# Patient Record
Sex: Female | Born: 1940 | Race: Black or African American | Hispanic: No | Marital: Married | Smoking: Never smoker
Health system: Southern US, Community
[De-identification: ages and names within clinical notes are randomized; demographics above are authoritative.]

## PROBLEM LIST (undated history)

## (undated) DIAGNOSIS — J45909 Unspecified asthma, uncomplicated: Secondary | ICD-10-CM

## (undated) DIAGNOSIS — K219 Gastro-esophageal reflux disease without esophagitis: Secondary | ICD-10-CM

## (undated) DIAGNOSIS — Z8719 Personal history of other diseases of the digestive system: Secondary | ICD-10-CM

## (undated) DIAGNOSIS — E785 Hyperlipidemia, unspecified: Secondary | ICD-10-CM

## (undated) DIAGNOSIS — G629 Polyneuropathy, unspecified: Secondary | ICD-10-CM

## (undated) DIAGNOSIS — Z8619 Personal history of other infectious and parasitic diseases: Secondary | ICD-10-CM

## (undated) DIAGNOSIS — E669 Obesity, unspecified: Secondary | ICD-10-CM

## (undated) DIAGNOSIS — C7800 Secondary malignant neoplasm of unspecified lung: Secondary | ICD-10-CM

## (undated) DIAGNOSIS — I2510 Atherosclerotic heart disease of native coronary artery without angina pectoris: Secondary | ICD-10-CM

## (undated) DIAGNOSIS — Z85038 Personal history of other malignant neoplasm of large intestine: Secondary | ICD-10-CM

## (undated) DIAGNOSIS — C189 Malignant neoplasm of colon, unspecified: Secondary | ICD-10-CM

## (undated) DIAGNOSIS — D509 Iron deficiency anemia, unspecified: Secondary | ICD-10-CM

## (undated) DIAGNOSIS — I10 Essential (primary) hypertension: Secondary | ICD-10-CM

## (undated) HISTORY — PX: ABDOMINAL HYSTERECTOMY: SHX81

## (undated) HISTORY — DX: Malignant neoplasm of colon, unspecified: C18.9

## (undated) HISTORY — PX: APPENDECTOMY: SHX54

## (undated) HISTORY — DX: Secondary malignant neoplasm of unspecified lung: C78.00

## (undated) HISTORY — PX: CHOLECYSTECTOMY: SHX55

## (undated) HISTORY — PX: COLON SURGERY: SHX602

---

## 2000-02-17 ENCOUNTER — Encounter: Payer: Self-pay | Admitting: Cardiology

## 2000-02-17 ENCOUNTER — Ambulatory Visit (HOSPITAL_COMMUNITY): Admission: RE | Admit: 2000-02-17 | Discharge: 2000-02-17 | Payer: Self-pay | Admitting: Cardiology

## 2000-12-15 ENCOUNTER — Other Ambulatory Visit: Admission: RE | Admit: 2000-12-15 | Discharge: 2000-12-15 | Payer: Self-pay | Admitting: Internal Medicine

## 2001-05-02 ENCOUNTER — Ambulatory Visit (HOSPITAL_COMMUNITY): Admission: RE | Admit: 2001-05-02 | Discharge: 2001-05-02 | Payer: Self-pay | Admitting: Internal Medicine

## 2001-06-07 ENCOUNTER — Other Ambulatory Visit: Admission: RE | Admit: 2001-06-07 | Discharge: 2001-06-07 | Payer: Self-pay | Admitting: Specialist

## 2001-06-13 ENCOUNTER — Ambulatory Visit (HOSPITAL_COMMUNITY): Admission: RE | Admit: 2001-06-13 | Discharge: 2001-06-13 | Payer: Self-pay | Admitting: Specialist

## 2001-06-13 ENCOUNTER — Encounter: Payer: Self-pay | Admitting: Family Medicine

## 2001-12-03 ENCOUNTER — Emergency Department (HOSPITAL_COMMUNITY): Admission: EM | Admit: 2001-12-03 | Discharge: 2001-12-04 | Payer: Self-pay | Admitting: Emergency Medicine

## 2001-12-04 ENCOUNTER — Encounter: Payer: Self-pay | Admitting: Emergency Medicine

## 2003-12-16 ENCOUNTER — Ambulatory Visit (HOSPITAL_COMMUNITY): Admission: RE | Admit: 2003-12-16 | Discharge: 2003-12-16 | Payer: Self-pay | Admitting: Family Medicine

## 2004-03-17 ENCOUNTER — Ambulatory Visit (HOSPITAL_COMMUNITY): Admission: RE | Admit: 2004-03-17 | Discharge: 2004-03-17 | Payer: Self-pay | Admitting: Family Medicine

## 2005-06-01 ENCOUNTER — Ambulatory Visit (HOSPITAL_COMMUNITY): Admission: RE | Admit: 2005-06-01 | Discharge: 2005-06-01 | Payer: Self-pay | Admitting: Family Medicine

## 2005-06-20 ENCOUNTER — Emergency Department (HOSPITAL_COMMUNITY): Admission: EM | Admit: 2005-06-20 | Discharge: 2005-06-20 | Payer: Self-pay | Admitting: Emergency Medicine

## 2006-05-12 ENCOUNTER — Other Ambulatory Visit: Admission: RE | Admit: 2006-05-12 | Discharge: 2006-05-12 | Payer: Self-pay | Admitting: Family Medicine

## 2006-06-07 ENCOUNTER — Encounter (INDEPENDENT_AMBULATORY_CARE_PROVIDER_SITE_OTHER): Payer: Self-pay | Admitting: Specialist

## 2006-06-07 ENCOUNTER — Ambulatory Visit (HOSPITAL_COMMUNITY): Admission: RE | Admit: 2006-06-07 | Discharge: 2006-06-07 | Payer: Self-pay | Admitting: Internal Medicine

## 2006-06-07 ENCOUNTER — Ambulatory Visit: Payer: Self-pay | Admitting: Internal Medicine

## 2006-07-19 ENCOUNTER — Ambulatory Visit (HOSPITAL_COMMUNITY): Admission: RE | Admit: 2006-07-19 | Discharge: 2006-07-19 | Payer: Self-pay | Admitting: Internal Medicine

## 2006-10-29 ENCOUNTER — Emergency Department (HOSPITAL_COMMUNITY): Admission: EM | Admit: 2006-10-29 | Discharge: 2006-10-29 | Payer: Self-pay | Admitting: Emergency Medicine

## 2007-05-29 ENCOUNTER — Other Ambulatory Visit: Admission: RE | Admit: 2007-05-29 | Discharge: 2007-05-29 | Payer: Self-pay | Admitting: Family Medicine

## 2007-08-08 ENCOUNTER — Ambulatory Visit (HOSPITAL_COMMUNITY): Admission: RE | Admit: 2007-08-08 | Discharge: 2007-08-08 | Payer: Self-pay | Admitting: Family Medicine

## 2008-07-03 ENCOUNTER — Other Ambulatory Visit: Admission: RE | Admit: 2008-07-03 | Discharge: 2008-07-03 | Payer: Self-pay | Admitting: Family Medicine

## 2008-08-14 ENCOUNTER — Ambulatory Visit (HOSPITAL_COMMUNITY): Admission: RE | Admit: 2008-08-14 | Discharge: 2008-08-14 | Payer: Self-pay | Admitting: Family Medicine

## 2008-09-03 ENCOUNTER — Ambulatory Visit (HOSPITAL_COMMUNITY): Admission: RE | Admit: 2008-09-03 | Discharge: 2008-09-03 | Payer: Self-pay | Admitting: Family Medicine

## 2009-07-04 ENCOUNTER — Other Ambulatory Visit: Admission: RE | Admit: 2009-07-04 | Discharge: 2009-07-04 | Payer: Self-pay | Admitting: Family Medicine

## 2009-07-12 DIAGNOSIS — J45909 Unspecified asthma, uncomplicated: Secondary | ICD-10-CM

## 2009-07-12 HISTORY — DX: Unspecified asthma, uncomplicated: J45.909

## 2009-08-14 ENCOUNTER — Emergency Department (HOSPITAL_COMMUNITY): Admission: EM | Admit: 2009-08-14 | Discharge: 2009-08-15 | Payer: Self-pay | Admitting: Emergency Medicine

## 2009-08-22 ENCOUNTER — Ambulatory Visit (HOSPITAL_COMMUNITY): Admission: RE | Admit: 2009-08-22 | Discharge: 2009-08-22 | Payer: Self-pay | Admitting: Family Medicine

## 2009-09-15 ENCOUNTER — Ambulatory Visit (HOSPITAL_COMMUNITY): Admission: RE | Admit: 2009-09-15 | Discharge: 2009-09-15 | Payer: Self-pay | Admitting: Family Medicine

## 2009-11-25 ENCOUNTER — Encounter: Admission: RE | Admit: 2009-11-25 | Discharge: 2009-11-25 | Payer: Self-pay | Admitting: Endocrinology

## 2010-07-17 ENCOUNTER — Other Ambulatory Visit
Admission: RE | Admit: 2010-07-17 | Discharge: 2010-07-17 | Payer: Self-pay | Source: Home / Self Care | Admitting: Family Medicine

## 2010-09-30 LAB — GLUCOSE, CAPILLARY: Glucose-Capillary: 170 mg/dL — ABNORMAL HIGH (ref 70–99)

## 2010-10-09 ENCOUNTER — Other Ambulatory Visit (HOSPITAL_COMMUNITY): Payer: Self-pay | Admitting: Family Medicine

## 2010-10-09 DIAGNOSIS — Z139 Encounter for screening, unspecified: Secondary | ICD-10-CM

## 2010-10-15 ENCOUNTER — Ambulatory Visit (HOSPITAL_COMMUNITY)
Admission: RE | Admit: 2010-10-15 | Discharge: 2010-10-15 | Disposition: A | Discharge status: Home / Self Care | Payer: Medicare Other | Source: Ambulatory Visit | Attending: Family Medicine | Admitting: Family Medicine

## 2010-10-15 DIAGNOSIS — Z1231 Encounter for screening mammogram for malignant neoplasm of breast: Secondary | ICD-10-CM | POA: Insufficient documentation

## 2010-10-15 DIAGNOSIS — Z139 Encounter for screening, unspecified: Secondary | ICD-10-CM

## 2010-11-27 NOTE — Op Note (Signed)
[name], [name]              ACCOUNT NO.:  [id number]   MEDICAL RECORD NO.:  [id number]          PATIENT TYPE:  AMB   LOCATION:  DAY                           FACILITY:  [facility]   PHYSICIAN:  Lionel December, M.D.    DATE OF BIRTH:  1941-03-28   DATE OF PROCEDURE:  06/07/2006  DATE OF DISCHARGE:                               OPERATIVE REPORT   PROCEDURE:  Colonoscopy.   INDICATIONS:  Peggy French is 70 year old Afro American female with history of  colonic polyps who is here for surveillance exam.  Family history is  negative for colorectal carcinoma.  Procedure risks were reviewed the  patient, informed consent was obtained.   MEDS FOR CONSCIOUS SEDATION:  Demerol 50 mg IV, Versed 9 mg IV.   FINDINGS:  Procedure performed in endoscopy suite.  The patient's vital  signs and O2 sat were monitored during procedure and remained stable.  The patient was placed in left lateral position.  Rectal examination  performed.  No abnormality noted external or digital exam.  Olympus  videoscope was placed rectum and advanced under vision into sigmoid  colon beyond.  Preparation was satisfactory except she had thick stool  coating of cecal mucosa.  Ileocecal valve was well seen.  Part of the  blunt-end was also seen after washing appeared to be normal.  Pictures  taken for the record.  As the scope was withdrawn colonic mucosa was  carefully examined.  There was two small polyps close to each other at  [facility] colon which were ablated via cold biopsy and submitted in one  container.  Mucosa rest of the colon was normal.  Rectal mucosa  similarly was normal.  Scope was retroflexed to examine anorectal  junction which was unremarkable.  Endoscope was then withdrawn.  The  patient tolerated the procedure well.   FINAL DIAGNOSIS:  Examination performed to cecum.  Two small polyps  ablated via cold biopsy from sigmoid colon (one container).   RECOMMENDATIONS:  She will resume her usual meds and diet as  before.  I  will be contacting patient with results of biopsy and further  recommendations.      Lionel December, M.D.  Electronically Signed     NR/MEDQ  D:  06/07/2006  T:  06/07/2006  Job:  [id number]   cc:   Annia Friendly. Loleta Chance, MD  Fax: [phone]   Catalina Pizza, M.D.  Fax: [phone]

## 2010-11-27 NOTE — Cardiovascular Report (Signed)
[place]  Patient:    Peggy French, Peggy French                     MRN: [id number] Proc. Date: 02/17/00 Adm. Date:  [id number] Attending:  Ophelia Shoulder CC:         Madaline Savage, M.D.  Mirna Mires, M.D.   Cardiac Catheterization  PROCEDURES:                   1. Left heart catheterization.                               2. Coronary angiography.                               3. Left ventriculogram.                               4. Abdominal aortogram.  ATTENDING:                    Lenise Herald, M.D.  COMPLICATIONS:                None.  INDICATIONS:                  Ms. Landino is a 70 year old black female patient of Madaline Savage, M.D. and Mirna Mires, M.D. with a history of hypertension and hyperlipidemia who has continued to complain of chest pain for the last several months.  She underwent a stress Cardiolite on December 30, 1999, which revealed normal EF, breast attenuation, but no evidence of ischemia.  She has continued to complain of chest pain and is now referred for cardiac catheterization to determine coronary anatomy.  DESCRIPTION OF PROCEDURE:     After given informed written consent, the patient was brought to the cardiac catheterization lab where right and left groins were shaved and prepped and draped in the usual sterile fashion.  ECG monitored status.  Using modified Seldinger technique, a #6 French arterial sheath was inserted in the right femoral artery.  A 6 French diagnostic catheter was then used to perform diagnostic angiography.  This reveals medium sized left main with no significant disease.                                The LAD is a medium size vessel which coursed to the apex and gave rise to three diagonal branches.  The LAD is noted to have a 30% ostial and 30% proximal stenotic lesion.  The first and second diagonals were small vessels with no significant disease.  The third diagonal was  a medium size vessel with no significant disease.                                The left circumflex is a medium size vessel which coursed to the AV groove and gave rise to three obtuse marginal branches.  The AV groove circumflex has no significant disease.  The first OM and second OM were medium size vessels with no significant disease.  The third OM was a small vessel with no significant disease.  The right coronary artery is a medium size vessel which is dominant and gives rise to the PDA.  There is a spasm noted in the proximal RCA which is relieved with intracoronary nitroglycerin.  There are no significant lesions in the RCA or PDA.                                Left ventriculogram reveals a preserved EF of 70%.  There is no significant MR.                                Abdominal aortogram reveals no evidence of renal artery stenosis with a normal distal aorta.  HEMODYNAMICS:                 Systemic arterial pressure 150/93.  LV system pressure 149/16.  LV EP of 18.  CONCLUSION:                   1. No significant coronary artery disease.                               2. Spasm noted proximal right coronary artery                                  relieved with intracoronary nitroglycerin.                               3. Normal left ventricular systolic function.                               4. No evidence of renal artery stenosis.                               5. Systemic hypertension. DD:  02/17/00 TD:  02/17/00 Job: [id number]

## 2011-04-01 ENCOUNTER — Emergency Department (HOSPITAL_COMMUNITY)
Admission: EM | Admit: 2011-04-01 | Discharge: 2011-04-01 | Disposition: A | Discharge status: Other Acute Inpatient Hospital | Payer: Medicare Other | Source: Home / Self Care | Attending: Emergency Medicine | Admitting: Emergency Medicine

## 2011-04-01 ENCOUNTER — Encounter: Payer: Self-pay | Admitting: *Deleted

## 2011-04-01 ENCOUNTER — Emergency Department (HOSPITAL_COMMUNITY): Payer: Medicare Other

## 2011-04-01 ENCOUNTER — Other Ambulatory Visit: Payer: Self-pay

## 2011-04-01 ENCOUNTER — Inpatient Hospital Stay (HOSPITAL_COMMUNITY)
Admission: AD | Admit: 2011-04-01 | Discharge: 2011-04-03 | DRG: 392 | Disposition: A | Discharge status: Home / Self Care | Payer: Medicare Other | Source: Other Acute Inpatient Hospital | Attending: Cardiovascular Disease | Admitting: Cardiovascular Disease

## 2011-04-01 DIAGNOSIS — I2510 Atherosclerotic heart disease of native coronary artery without angina pectoris: Secondary | ICD-10-CM | POA: Diagnosis present

## 2011-04-01 DIAGNOSIS — I10 Essential (primary) hypertension: Secondary | ICD-10-CM | POA: Diagnosis present

## 2011-04-01 DIAGNOSIS — Z79899 Other long term (current) drug therapy: Secondary | ICD-10-CM

## 2011-04-01 DIAGNOSIS — K3184 Gastroparesis: Principal | ICD-10-CM | POA: Diagnosis present

## 2011-04-01 DIAGNOSIS — E119 Type 2 diabetes mellitus without complications: Secondary | ICD-10-CM | POA: Diagnosis present

## 2011-04-01 DIAGNOSIS — I200 Unstable angina: Secondary | ICD-10-CM

## 2011-04-01 DIAGNOSIS — M109 Gout, unspecified: Secondary | ICD-10-CM | POA: Diagnosis present

## 2011-04-01 HISTORY — DX: Essential (primary) hypertension: I10

## 2011-04-01 HISTORY — DX: Hyperlipidemia, unspecified: E78.5

## 2011-04-01 LAB — CARDIAC PANEL(CRET KIN+CKTOT+MB+TROPI)
CK, MB: 2.5 ng/mL (ref 0.3–4.0)
CK, MB: 2.4 ng/mL (ref 0.3–4.0)
Total CK: 149 U/L (ref 7–177)
Troponin I: <0.3 ng/mL (ref <0.30)
Troponin I: <0.3 ng/mL (ref <0.30)

## 2011-04-01 LAB — GLUCOSE, CAPILLARY: Glucose-Capillary: 163 mg/dL — ABNORMAL HIGH (ref 70–99)

## 2011-04-01 LAB — BASIC METABOLIC PANEL
BUN: 13 mg/dL (ref 6–23)
CO2: 28 mEq/L (ref 19–32)
GFR calc Af Amer: >60 mL/min (ref >60)
GFR calc non Af Amer: >60 mL/min (ref >60)
Glucose, Bld: 155 mg/dL — ABNORMAL HIGH (ref 70–99)
Potassium: 3.9 mEq/L (ref 3.5–5.1)
Sodium: 139 mEq/L (ref 135–145)

## 2011-04-01 LAB — PROTIME-INR: INR: 1.06 (ref 0.00–1.49)

## 2011-04-01 LAB — CBC
MCV: 79.4 fL (ref 78.0–100.0)
Platelets: 258 10*3/uL (ref 150–400)

## 2011-04-01 LAB — HEPATIC FUNCTION PANEL
ALT: 16 U/L (ref 0–35)
Total Protein: 7.7 g/dL (ref 6.0–8.3)

## 2011-04-01 LAB — MRSA PCR SCREENING: MRSA by PCR: NEGATIVE

## 2011-04-01 LAB — MAGNESIUM: Magnesium: 2 mg/dL (ref 1.5–2.5)

## 2011-04-01 MED ORDER — ASPIRIN 81 MG PO CHEW
324.0000 mg | CHEWABLE_TABLET | Freq: Once | ORAL | Status: AC
  Administered 2011-04-01: 324 mg via ORAL
  Filled 2011-04-01: qty 1
  Filled 2011-04-01: qty 4

## 2011-04-01 MED ORDER — NITROGLYCERIN 2 % TD OINT
1.0000 [in_us] | TOPICAL_OINTMENT | Freq: Once | TRANSDERMAL | Status: AC
  Administered 2011-04-01: 1 [in_us] via TOPICAL
  Filled 2011-04-01: qty 1

## 2011-04-01 MED ORDER — MORPHINE SULFATE 4 MG/ML IJ SOLN
4.0000 mg | Freq: Once | INTRAMUSCULAR | Status: AC
  Administered 2011-04-01: 4 mg via INTRAVENOUS
  Filled 2011-04-01: qty 1

## 2011-04-01 MED ORDER — SODIUM CHLORIDE 0.9 % IJ SOLN
10.0000 mL | Freq: Once | INTRAMUSCULAR | Status: AC
  Administered 2011-04-01: 10 mL via INTRAVENOUS

## 2011-04-01 NOTE — ED Notes (Signed)
Pt also c/o non-productive cough 

## 2011-04-01 NOTE — ED Provider Notes (Signed)
History   Chart scribed for Raeford Razor, MD by Enos Fling; the patient was seen in room [facility]; this patient's care was started at 10:41 AM.    CSN: [id number] Arrival date & time: 04/01/2011 10:30 AM   Chief Complaint  Patient presents with  . Chest Pain    HPI - Pt seen at 10:49am Peggy French is a 70 y.o. female who presents to the Emergency Department complaining of chest pain. Pt c/o substernal chest pain described as tightness with intermittent worse sharp pains associated with sob. Pain is waxing/waning but constant. Pain not relieved or exacerbated with anything; has tried tums with no relief of pain. Pain mild at this time. Pt reports recent dry cough, thought d/t seasonal allergies. Denies leg swelling, diaphoresis, f/c, rash, back pain, abd pain, or n/v. No recent heavy lifting or injury. Similar episode of pain approx 1 year ago but states normal EKG; has had cardiac cath a few times but none recently. Last stress test several years ago. Pt h/o GERD but this pain different than usual GERD pain; also h/o HTN, hyperlipidemia, and DM. No h/o blood clots.    Past Medical History  Diagnosis Date  . Gout   . Hypertension   . Hyperlipidemia   . Diabetes mellitus      Past Surgical History  Procedure Date  . Colon surgery   . Cholecystectomy   . Abdominal hysterectomy     History reviewed. No pertinent family history.  History  Substance Use Topics  . Smoking status: Never Smoker   . Smokeless tobacco: Not on file  . Alcohol Use: No    OB History    Grav Para Term Preterm Abortions TAB SAB Ect Mult Living                  Review of Systems 10 Systems reviewed and are negative for acute change except as noted in the HPI.  Allergies  Review of patient's allergies indicates no known allergies.  Home Medications   Current Outpatient Rx  Name Route Sig Dispense Refill  . ACETAMINOPHEN 500 MG PO TABS Oral Take 1,000 mg by mouth every 6 (six) hours  as needed. For pain     . ALBUTEROL SULFATE HFA 108 (90 BASE) MCG/ACT IN AERS Inhalation Inhale 2 puffs into the lungs every 6 (six) hours as needed. For shortness of breath     . AMLODIPINE BESYLATE 5 MG PO TABS Oral Take 5 mg by mouth daily.      . COLCHICINE 0.6 MG PO TABS Oral Take 0.6 mg by mouth daily.      . INVESTIGATIONAL DRUG MIXTURE RECORD Oral Take 1 capsule by mouth daily. Protocol # [id number] Subject # [id number]     . DIPHENOXYLATE-ATROPINE 2.5-0.025 MG PO TABS Oral Take 1 tablet by mouth every 6 (six) hours as needed. For diarrhea     . GABAPENTIN 100 MG PO CAPS Oral Take 100 mg by mouth at bedtime.      Marland Kitchen LINAGLIPTIN 5 MG PO TABS Oral Take 5 mg by mouth daily.      Marland Kitchen OMEPRAZOLE 20 MG PO CPDR Oral Take 20 mg by mouth daily.      Marland Kitchen PROPRANOLOL-HCTZ 40-25 MG PO TABS Oral Take 1 tablet by mouth daily.      Marland Kitchen RAMIPRIL 5 MG PO CAPS Oral Take 5 mg by mouth daily.      Marland Kitchen SIMVASTATIN 40 MG PO TABS Oral Take 40 mg by mouth  at bedtime.        Physical Exam    BP 126/71  Pulse 65  Temp(Src) 97.5 F (36.4 C) (Oral)  Resp 17  SpO2 100%  Physical Exam  Nursing note and vitals reviewed. Constitutional: She is oriented to person, place, and time. She appears well-developed and well-nourished. No distress.  HENT:  Head: Normocephalic.  Mouth/Throat: Oropharynx is clear and moist and mucous membranes are normal.  Eyes: Conjunctivae are normal.  Neck: Normal range of motion. Neck supple.  Cardiovascular: Normal rate, regular rhythm and intact distal pulses.  Exam reveals no gallop and no friction rub.   No murmur heard. Pulmonary/Chest: Effort normal and breath sounds normal. She has no wheezes. She has no rales. She exhibits tenderness (mild lower sternal).  Abdominal: Soft. There is no tenderness.  Musculoskeletal: Normal range of motion. She exhibits edema (trace BLE). She exhibits no tenderness.  Neurological: She is alert and oriented to person, place, and time.  Skin: Skin is  warm and dry. No rash noted.  Psychiatric: She has a normal mood and affect.    Procedures - none  OTHER DATA REVIEWED: Nursing notes and vital signs reviewed.   DIAGNOSTIC STUDIES: Oxygen Saturation is 100% on room air, normal by my Interpretation.  EKG:   Date: 04/01/2011  Rate:89  Rhythm: normal sinus rhythm  QRS Axis: normal  Intervals: normal  ST/T Wave abnormalities: nonspecific ST changes  Conduction Disutrbances:none  Narrative Interpretation: NS t wave flattening anteriorly  Old EKG Reviewed: none available   Cardiac Monitor: 04/01/2011 11:58 AM Sinus, rate 68, no ectopy   LABS / RADIOLOGY: Results for orders placed during the hospital encounter of 04/01/11  GLUCOSE, CAPILLARY      Component Value Range   Glucose-Capillary 163 (*) 70 - 99 (mg/dL)  CBC      Component Value Range   WBC 4.8  4.0 - 10.5 (K/uL)   RBC 5.09  3.87 - 5.11 (MIL/uL)   Hemoglobin 13.3  12.0 - 15.0 (g/dL)   HCT 16.1  09.6 - 04.5 (%)   MCV 79.4  78.0 - 100.0 (fL)   MCH 26.1  26.0 - 34.0 (pg)   MCHC 32.9  30.0 - 36.0 (g/dL)   RDW 40.9  81.1 - 91.4 (%)   Platelets 258  150 - 400 (K/uL)  BASIC METABOLIC PANEL      Component Value Range   Sodium 139  135 - 145 (mEq/L)   Potassium 3.9  3.5 - 5.1 (mEq/L)   Chloride 101  96 - 112 (mEq/L)   CO2 28  19 - 32 (mEq/L)   Glucose, Bld 155 (*) 70 - 99 (mg/dL)   BUN 13  6 - 23 (mg/dL)   Creatinine, Ser 7.82  0.50 - 1.10 (mg/dL)   Calcium 9.3  8.4 - 95.6 (mg/dL)   GFR calc non Af Amer >60  >60 (mL/min)   GFR calc Af Amer >60  >60 (mL/min)  CARDIAC PANEL(CRET KIN+CKTOT+MB+TROPI)      Component Value Range   Total CK 149  7 - 177 (U/L)   CK, MB 2.5  0.3 - 4.0 (ng/mL)   Troponin I <0.30  <0.30 (ng/mL)   Relative Index 1.7  0.0 - 2.5    Dg Chest Portable 1 View  04/01/2011  *RADIOLOGY REPORT*  Clinical Data: Chest pain.  Shortness of breath.  History of hypertension and diabetes.  PORTABLE CHEST - 1 VIEW 04/01/2011 1102 hours:  Comparison: None.   Findings: Cardiac silhouette  upper normal in size for the AP portable technique.  Thoracic aorta mildly tortuous.  Hilar and mediastinal contours otherwise unremarkable.  Lungs clear.  No visible pleural effusions.  IMPRESSION: No acute cardiopulmonary disease.  Original Report Authenticated By: Arnell Sieving, M.D.      ED COURSE: 12:45 PM - Dr. Karilyn Cota ([facility] hospitalist) in ED, spoke with pt, recommends admission at  [facility] for further cardiac work up. 1:32 PM - Consult with cardiology, Dr. Allyson Sabal, agrees with recommendation for admission at [facility], will consult  MDM: 69yF with CP. Initial ekg and cardiac enzymes unremarkable. Concern for unstable angina though. Pt with multiple risk factors. Hx of GERD but current symptoms different. Discussed with medicine for admission and felt that pt would be more appropriate for transfer to [facility]. Discussed with cards who accepted.  IMPRESSION: 1. Unstable angina      PLAN: All results reviewed and discussed with pt, questions answered, pt agreeable with plan.   MEDS GIVEN IN ED: nitroGLYCERIN (NITROGLYN) 2 % ointment 1 inch  morphine 4 MG/ML injection 4 mg  aspirin chewable tablet 324 mg   sodium chloride 0.9 % injection 10 mL (10 mL Intravenous Given 04/01/11 1048)      SCRIBE ATTESTATION: I personally preformed the services scribed in my presence. The recorded information has been reviewed and considered. Raeford Razor, MD.       Raeford Razor, MD 04/23/11 [phone]

## 2011-04-01 NOTE — ED Notes (Signed)
Pt states she has cp  Center of her chest that radiates down her right arm. Pt describes pain as crushing.

## 2011-04-01 NOTE — Consult Note (Signed)
Peggy French MRN: [id number] DOB/AGE: Mar 17, 1941 70 y.o. Primary Care Physician:HILL,GERALD K, MD Admit date: 04/01/2011 Chief Complaint: Chest pain HPI: This 70 year old lady presents to the emergency room with episodes of chest tightness/chest pain. She was woken up at 2 AM this morning with chest tightness which she describes as a pain, constricting, without radiation and rating it at 10 out of 10 in intensity. It was associated with dyspnea. There was no nausea, sweating or palpitations. She then fell asleep after 30-45 minutes and woke again at 4:30 AM whereupon the pain was still present. She describes the pain as coming and going and increasing and decreasing in intensity. She called her  primary care physician at 8 AM and was advised to come to the emergency room. In the emergency room, electrocardiogram and initial cardiac enzymes are unremarkable. She does have a history of gastroesophageal reflux disease but the discomfort she felt with this is not the same. She does have a history of hypertension, hyperlipidemia, diabetes.  Past Medical History  Diagnosis Date  . Gout   . Hypertension   . Hyperlipidemia   . Diabetes mellitus    Past Surgical History  Procedure Date  . Colon surgery   . Cholecystectomy   . Abdominal hysterectomy          Family history: No history of early coronary artery disease.  Social history: she is married and lives with her husband. She does not drink alcohol. She is a nonsmoker.  Allergies: No Known Allergies  Medications Prior to Admission  Medication Dose Route Frequency Provider Last Rate Last Dose  . aspirin chewable tablet 324 mg  324 mg Oral Once Raeford Razor, MD   324 mg at 04/01/11 1336  . morphine 4 MG/ML injection 4 mg  4 mg Intravenous Once Raeford Razor, MD   4 mg at 04/01/11 1342  . nitroGLYCERIN (NITROGLYN) 2 % ointment 1 inch  1 inch Topical Once Raeford Razor, MD   1 inch at 04/01/11 1337  . sodium chloride 0.9 % injection  10 mL  10 mL Intravenous Once Raeford Razor, MD   10 mL at 04/01/11 1048   No current outpatient prescriptions on file as of 04/01/2011.       BJY:NWGNF from the symptoms mentioned above,there are no other symptoms referable to all systems reviewed.  Physical Exam: Blood pressure 145/81, pulse 62, temperature 97.5 F (36.4 C), temperature source Oral, resp. rate 18, SpO2 100.00%. She looks systemically well and does not appear to be in any pain. Heart sounds are present and normal without murmurs. There is no gallop rhythm. There is no pericardial rub. Lung fields are clear. Abdomen is soft and nontender. Neurologically she is intact with no focal neurological signs. She is alert and orientated.   Basename 04/01/11 1046  WBC 4.8  NEUTROABS --  HGB 13.3  HCT 40.4  MCV 79.4  PLT 258    Basename 04/01/11 1046  NA 139  K 3.9  CL 101  CO2 28  GLUCOSE 155*  BUN 13  CREATININE 0.91  CALCIUM 9.3  MG --  PHOS --     Dg Chest Portable 1 View  04/01/2011  *RADIOLOGY REPORT*  Clinical Data: Chest pain.  Shortness of breath.  History of hypertension and diabetes.  PORTABLE CHEST - 1 VIEW 04/01/2011 1102 hours:  Comparison: None.  Findings: Cardiac silhouette upper normal in size for the [facility] portable technique.  Thoracic aorta mildly tortuous.  Hilar and mediastinal contours otherwise unremarkable.  Lungs clear.  No visible pleural effusions.  IMPRESSION: No acute cardiopulmonary disease.  Original Report Authenticated By: Arnell Sieving, M.D.   Impression: 1. Unstable angina.     Plan: 1. Although her initial cardiac enzymes are negative and electrocardiogram is unremarkable, her history is very convincing for unstable angina and therefore she, in my opinion, requires cardiac catheterization as she is having cardiac pain at rest. I would recommend that she be transferred to [facility] for this. I've spoken with the emergency room  physician, Dr. Juleen China regarding my  concerns about her and he agrees and will coordinate transfer to [facility].      Lilly Cove C 04/01/2011, 2:13 PM

## 2011-04-01 NOTE — ED Notes (Signed)
Pt left via [facility]. Report given to Kristen RN at 2900 at [facility]. nad noted prior to transport. Pain free prior to transport.m

## 2011-04-02 LAB — CBC
HCT: 39.2 % (ref 36.0–46.0)
Hemoglobin: 13.5 g/dL (ref 12.0–15.0)
RBC: 5.07 MIL/uL (ref 3.87–5.11)
WBC: 6.5 10*3/uL (ref 4.0–10.5)

## 2011-04-02 LAB — BASIC METABOLIC PANEL
Chloride: 100 mEq/L (ref 96–112)
GFR calc Af Amer: >60 mL/min (ref >60)
Potassium: 3.5 mEq/L (ref 3.5–5.1)
Sodium: 137 mEq/L (ref 135–145)

## 2011-04-02 LAB — HEMOGLOBIN A1C
Hgb A1c MFr Bld: 6.5 % — ABNORMAL HIGH (ref <5.7)
Mean Plasma Glucose: 140 mg/dL — ABNORMAL HIGH (ref <117)

## 2011-04-02 LAB — LIPID PANEL
Cholesterol: 163 mg/dL (ref 0–200)
HDL: 60 mg/dL (ref >39)
Total CHOL/HDL Ratio: 2.7 RATIO

## 2011-04-02 LAB — GLUCOSE, CAPILLARY
Glucose-Capillary: 135 mg/dL — ABNORMAL HIGH (ref 70–99)
Glucose-Capillary: 104 mg/dL — ABNORMAL HIGH (ref 70–99)
Glucose-Capillary: 102 mg/dL — ABNORMAL HIGH (ref 70–99)

## 2011-04-02 LAB — TSH: TSH: 1.908 u[IU]/mL (ref 0.350–4.500)

## 2011-04-02 LAB — CARDIAC PANEL(CRET KIN+CKTOT+MB+TROPI)
CK, MB: 2.2 ng/mL (ref 0.3–4.0)
Relative Index: 1.9 (ref 0.0–2.5)

## 2011-04-02 LAB — HEPARIN LEVEL (UNFRACTIONATED): Heparin Unfractionated: 0.49 IU/mL (ref 0.30–0.70)

## 2011-04-02 LAB — PROTIME-INR: INR: 1.01 (ref 0.00–1.49)

## 2011-04-03 LAB — CBC
HCT: 36.1 % (ref 36.0–46.0)
Hemoglobin: 12.2 g/dL (ref 12.0–15.0)
MCH: 26.1 pg (ref 26.0–34.0)
MCHC: 33.8 g/dL (ref 30.0–36.0)
MCV: 77.1 fL — ABNORMAL LOW (ref 78.0–100.0)
RBC: 4.68 MIL/uL (ref 3.87–5.11)

## 2011-04-03 LAB — AMYLASE: Amylase: 119 U/L — ABNORMAL HIGH (ref 0–105)

## 2011-04-03 LAB — COMPREHENSIVE METABOLIC PANEL
ALT: 12 U/L (ref 0–35)
BUN: 11 mg/dL (ref 6–23)
CO2: 29 mEq/L (ref 19–32)
Calcium: 8.8 mg/dL (ref 8.4–10.5)
Creatinine, Ser: 0.96 mg/dL (ref 0.50–1.10)
GFR calc Af Amer: >60 mL/min (ref >60)
GFR calc non Af Amer: 58 mL/min — ABNORMAL LOW (ref >60)
Glucose, Bld: 135 mg/dL — ABNORMAL HIGH (ref 70–99)

## 2011-04-04 NOTE — Cardiovascular Report (Signed)
NAMEKANDICE, Peggy French NO.:  [id number]  MEDICAL RECORD NO.:  [id number]  LOCATION:  2928                         FACILITY:  [facility]  PHYSICIAN:  Italy Hilty, MD         DATE OF BIRTH:  15-Oct-1940  DATE OF PROCEDURE:  04/02/2011 DATE OF DISCHARGE:                           CARDIAC CATHETERIZATION   OPERATOR:  Italy Hilty, MD  INDICATIONS:  Chest pain, nausea and vomiting concerning for unstable angina.  HISTORY OF PRESENT ILLNESS:  Peggy French is a 70 year old female with a history of diabetes type 2, hypertension, gout and history of cholecystectomy, status post partial colectomy approximately 20 years ago who presents now with nausea, vomiting, chest pain and had a cardiac catheterization last in 2001 which showed normal coronaries with some RCA spasm.  Yesterday presented with chest pain and did rule out for MI by troponins and is referred for cardiac catheterization.  PROCEDURE IN DETAILS:  After informed consent was obtained, the patient was brought to cardiac catheterization lab, sterilely prepped and draped in usual fashion after procedural radiation safety time-out.  The area around the right femoral artery was identified, however, pulse was noted to be very weak.  There was neither a good pulse on the left groin either.  That area was then prepped and draped.  Fluoroscopy was used to identify the location of the suspected femoral artery and attempts were made to access that.  Then attempts were made using a "smart" needle which allowed direct access to the right femoral artery.  Once access was obtained, the sheath was placed without difficulty and subsequent catheterization was performed with a JL-4 pigtail catheters and number of right coronary catheters.  Ultimately an AR-1 was used to engage the right coronary artery which took somewhat of an inferior takeoff.  A LV gram was performed.  Estimated blood loss was less than 10 mL.  There were  no acute complications.  The patient received 1 mg of Versed, 25 mcg of fentanyl for moderate sedation and 10 mL of 1% local lidocaine.  FINDINGS: 1. Left main - no disease. 2. LAD - there is a 20-30% ostial bifurcation stenosis, however,     distally there are only mild luminal irregularities. 3. Left circumflex.  There was no significant disease.  There were 2     large OM branches also free of disease. 4. Right coronary artery.  No spasm was noted.  With the catheter was     engaged with an AR-1 as mentioned.  There is a dominant vessel with     only mild luminal irregularities. 5. LVEDP = 9 mmHg. 6. LV gram - EF greater than 60%, no wall motion abnormalities.  IMPRESSION: 1. No significant coronary artery disease. 2. Left ventricular ejection fraction greater than 60%.  PLAN:  Peggy French continues to have some nausea, vomiting and chest pressure.  I wonder this could be secondary to gastroparesis or possibly small gastroparesis.  I doubt she has a small bowel obstruction as she has been having a normal bowel movement.  She is followed by Dr. Karilyn Cota for GI and we may consider a need for further workup, perhaps of gastric emptying  study.  This may represent some gastroparesis given her longstanding diabetes. I would also increase her home omeprazole dose to  20 mg by mouth twice daily.     Italy Hilty, MD     CH/MEDQ  D:  04/02/2011  T:  04/02/2011  Job:  [id number]  cc:   Annia Friendly. Loleta Chance, MD Nanetta Batty, M.D.  Electronically Signed by Kirtland Bouchard. HILTY M.D. on 04/04/2011 03:11:14 PM

## 2011-04-05 LAB — GLUCOSE, CAPILLARY: Glucose-Capillary: 127 mg/dL — ABNORMAL HIGH (ref 70–99)

## 2011-04-11 NOTE — Discharge Summary (Signed)
Peggy French, Peggy French NO.:  [id number]  MEDICAL RECORD NO.:  [id number]  LOCATION:  2928                         FACILITY:  [facility]  PHYSICIAN:  Nanetta Batty, M.D.   DATE OF BIRTH:  1941/07/02  DATE OF ADMISSION:  04/01/2011 DATE OF DISCHARGE:  04/03/2011                              DISCHARGE SUMMARY   DISCHARGE DIAGNOSES: 1. Chest pain secondary to gastrointestinal source, further evaluation     as an outpatient.     a.     Negative myocardial infarction.     b.     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 2. Non-insulin-dependent diabetes mellitus type 2. 3. Hypertension. 4. History of right coronary artery spasm, in cath several years ago.  DISCHARGE CONDITION:  Improved.  PROCEDURES:  Combined left heart cath, April 02, 2011 by Dr. Italy Hilty.  DISCHARGE INSTRUCTIONS: 1. Increase activity slowly.  May shower.  No lifting for 1 week.  No     driving for 2 days. 2. Low-sodium heart-healthy diabetic diet. 3. Wash cath site with soap and water.  Call if any bleeding,     swelling, or drainage. 4. Follow up with Dr. Rennis Golden in [place] in 1-2 weeks and our office     will call with date and time. 5. Follow up with Dr. Karilyn Cota for Gastroenterology, call for     appointment on Monday.  HOSPITAL COURSE:  A 70 year old African American female with history of cardiac cath in 2001 with clean coronaries at that time, but right coronary spasm, has done well.  Since then, over the last 2 weeks, she has had one to two episodes of indigestion either daily or every other day. It did awaken her from sleep on the day of admission around 2:30. She took Tums, Prevacid, and drifted back to sleep, but again was awakened with this discomfort, shortness of breath as well.  Blood pressure was elevated 159/96.  She went to the emergency room at [facility] and was then transferred to [facility] for  further evaluation.  Here at [facility], she was on IV heparin.  Her pain was resolved, had a nitro paste, and then she underwent cardiac catheterization.  She did develop a lot of nausea and vomiting and was treated.  Cardiac cath was done, which revealed normal course except 20%-30% ostial LAD stenosis, otherwise stable with EF of greater than 60%.  She was kept overnight. We will ambulate prior to discharge.  Amylase was slightly elevated at 119, felt to be secondary to the vomiting. Lipase was negative.  LABS AT DISCHARGE:  Sodium 139, potassium 3.6, BUN 11, creatinine 0.96, glucose 135.  Hemoglobin 12.12, hematocrit 36, platelets 217, and WBC 5.3.  Amylase 119, lipase 27.  Glucose was controlled here in the hospital.  She will ambulate in the hall with no problems.  She will follow up as instructed.     Darcella Gasman. Annie Paras, N.P.   ______________________________ Nanetta Batty, M.D.    LRI/MEDQ  D:  04/03/2011  T:  04/03/2011  Job:  [id number]  cc:   Italy Hilty, MD Annia Friendly. Loleta Chance, MD Ok Anis  Karilyn Cota, M.D.  Electronically Signed by Nada Boozer N.P. on 04/04/2011 11:44:02 AM Electronically Signed by Nanetta Batty M.D. on 04/11/2011 11:47:46 AM

## 2011-04-11 NOTE — H&P (Signed)
Peggy French, Peggy French NO.:  [id number]  MEDICAL RECORD NO.:  [id number]  LOCATION:  2928                         FACILITY:  [facility]  PHYSICIAN:  Nanetta Batty, M.D.   DATE OF BIRTH:  [birth year]  DATE OF ADMISSION:  04/01/2011 DATE OF DISCHARGE:                             HISTORY & PHYSICAL   CHIEF COMPLAINT:  Chest pain.  HISTORY OF PRESENT ILLNESS:  A 70 year old African American female with history of heart catheterization in 2001 by Dr. Elsie Lincoln secondary to an ongoing chest pain despite a negative nuclear stress test.  The cardiac cath revealed clean coronary arteries but right coronary artery spasm. Since that time she has actually been quite well without any problems from a cardiac standpoint though over the last 2 weeks she has had 1-2 episodes of indigestion which she felt was indigestion in the morning and one in the evening either and she has these episodes since then every day or every other day.  Today was different, she was awakened from sleep around 2:30 in the morning and with this indigestion-type discomfort, she took Prevacid, Tums, was able to drift back off to sleep but then was again awakened at 4:30 in the morning with chest pain. Mildly short of breath, she got warm but no cold diaphoresis.  No nausea.  Blood pressure was up at 159/96.  She went to the emergency room at [facility].  There she was given nitroglycerin paste, baby aspirin and morphine IV 4 mg.  Her pain is now resolved.  Dr. Allyson Sabal was contacted.  He asked them to transfer the patient to [facility] by T[facility] Endo for further management of her chest pain.  Here in the step-down unit 2900, she is pain free without complaints currently and nitro paste is on and she has oxygen at 2 L.  PAST MEDICAL HISTORY: 1. Cardiac as above. 2. Diabetes mellitus type 2, non-insulin dependent. 3. Hypertension controlled mostly 4. She is on a gout study drug for her gout. 5. Status post  cholecystectomy. 6. Status post partial hysterectomy. 7. Status post colon surgery approximately 20 years ago followed by     Dr. Karilyn Cota in [place] for GI.  ALLERGIES:  No known allergies.  OUTPATIENT MEDICATIONS: 1. Colchicine 0.6 mg 1 every morning. 2. Study medication for gout.  She takes 2 in the morning 2 different     types of pill. 3. Linagliptin 5 mg one daily. 4. Simvastatin 40 mg daily at bedtime. 5. Amlodipine 5 mg daily. 6. Prilosec 20 mg 1 daily. 7. Ramipril 5 mg daily. 8. Propranolol and hydrochlorothiazide 40/25 one daily. 9. Gabapentin 100 mg one daily at bedtime. 10.Lomotil p.r.n.  FAMILY HISTORY:  Positive.  One sister died suddenly at home, felt to be acute MI.  One sister recently had a catheterization last week and was determined she had pulmonary embolus.  She has 3 brothers alive and well.  Mother died at 14, no coronary artery disease.  Father unsure of his history.  SOCIAL HISTORY:  She is married to her husband 46 years.  They have 4 children in that one is deceased and one grandchild.  She does not use tobacco, alcohol or illicit  drugs and never have.  REVIEW OF SYSTEMS:  GENERAL:  No colds or fevers.  SKIN:  No rashes or ulcers.  HEENT:  Negative.  No blurred vision.  CARDIOVASCULAR:  As described.  PULMONARY:  As described, though occasionally she does get short of breath and uses an inhaler at home.  GI:  She occasionally has bright blood in her stool, felt to be related to hemorrhoids.  GU:  No hematuria or dysuria.  NEURO:  No syncope or lightheadedness. MUSCULOSKELETAL:  She has arthritic pains.  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  PHYSICAL EXAMINATION:  VITAL SIGNS:  Blood pressure 110/79, pulse 67, respirations 12, temperature is pending, oxygen saturation with 2 L 97%. GENERAL:  Alert, oriented African American female in no acute distress. Pleasant affect. SKIN:  Warm and dry.  Brisk upper refill. HEENT:  Normocephalic.   Sclerae clear. NECK:  Supple.  No JVD.  No bruits.  No thyromegaly. LUNGS:  Clear without rales, rhonchi or wheezes. CARDIAC:  Heart sounds S1 and S2.  Regular rate and rhythm without murmur, gallop, rub or click. ABDOMEN:  Soft, mild diffuse tenderness but no guarding, has no particular area of tenderness. EXTREMITIES:  2+ pedals bilaterally.  No lower extremity edema. NEURO:  Alert and oriented x3.  Follows commands and moves all extremities.  EKG, sinus rhythm without any acute changes.  CK 149, MB 2.5, troponin-I less than 0.30.  Sodium 139, potassium 3.9, BUN 13, creatinine 0.91, glucose 155, calcium 9.3, hemoglobin 13.3, hematocrit 40.4, WBC 4.6, platelets 258.  IMPRESSION: 1. Chest pain, rule out cardiac with a history of right coronary     artery spasm in 2001. 2. Diabetes mellitus II, non-insulin dependent. 3. Hypertension. 4. Family member with sudden death and that was actually her sister.  PLAN:  Admit to step-down, serial CK MBs, IV heparin.  Plan cardiac catheterization in the morning.  Dr. Allyson Sabal examined her and felt radial cath would be appropriate for this patient and that will be scheduled.     Darcella Gasman. Annie Paras, N.P.   ______________________________ Nanetta Batty, M.D.    LRI/MEDQ  D:  04/01/2011  T:  04/01/2011  Job:  [id number]  cc:   Annia Friendly. Loleta Chance, MD Dr. Talmage Nap  Electronically Signed by Nada Boozer N.P. on 04/02/2011 03:21:36 PM Electronically Signed by Nanetta Batty M.D. on 04/11/2011 11:47:50 AM

## 2011-05-11 ENCOUNTER — Encounter (INDEPENDENT_AMBULATORY_CARE_PROVIDER_SITE_OTHER): Payer: Self-pay | Admitting: Internal Medicine

## 2011-05-11 ENCOUNTER — Encounter (INDEPENDENT_AMBULATORY_CARE_PROVIDER_SITE_OTHER): Payer: Self-pay | Admitting: *Deleted

## 2011-05-11 ENCOUNTER — Other Ambulatory Visit (INDEPENDENT_AMBULATORY_CARE_PROVIDER_SITE_OTHER): Payer: Self-pay | Admitting: *Deleted

## 2011-05-11 ENCOUNTER — Ambulatory Visit (INDEPENDENT_AMBULATORY_CARE_PROVIDER_SITE_OTHER): Payer: Medicare Other | Admitting: Internal Medicine

## 2011-05-11 VITALS — BP 130/84 | HR 80 | Temp 98.2°F | Ht 63.0 in | Wt 212.2 lb

## 2011-05-11 DIAGNOSIS — R0789 Other chest pain: Secondary | ICD-10-CM

## 2011-05-11 DIAGNOSIS — R110 Nausea: Secondary | ICD-10-CM

## 2011-05-11 DIAGNOSIS — E785 Hyperlipidemia, unspecified: Secondary | ICD-10-CM | POA: Insufficient documentation

## 2011-05-11 DIAGNOSIS — I10 Essential (primary) hypertension: Secondary | ICD-10-CM | POA: Insufficient documentation

## 2011-05-11 DIAGNOSIS — M109 Gout, unspecified: Secondary | ICD-10-CM | POA: Insufficient documentation

## 2011-05-11 NOTE — Progress Notes (Signed)
Subjective:     Patient ID: Peggy French, female   DOB: [birth year], 70 y.o.   MRN: [id number]  HPI  Peggy French is a 70 yr old female here today for a scheduled visit. She is a referral from Dr. Loleta Chance. She tells me she has been having chest pain and choking sensation. She has been having this pain for off and on for 5-6 months.  She underwent a cardiac catherizations  2 months ago which revealed: 1. No significant coronary artery disease.  2. Left ventricular ejection fraction greater than 60%.    She continues to c/o choking sensation.  She also has SOB at times.   Foods are not lodging in her esophagus.  She c/o acid reflux about 2-3 times a week.  Her Omeprazole was increased to twice a day.  The pain will occur about 2 hrs after eating and occurs at night.  She does have some nausea off and on.  Nausea occurs every day.   Appetite is good. No unintentional  weight loss. No abdominal pain.  She has a BM x 1-2 a day.    Colonoscopy in 2007 for hx of colonic polyps Dr. Rehman:FINAL DIAGNOSIS: Examination performed to cecum. Two small polyps  ablated via cold biopsy from sigmoid colon (one container).   Review of Systems   See hpi Current Outpatient Prescriptions  Medication Sig Dispense Refill  . acetaminophen (TYLENOL) 500 MG tablet Take 1,000 mg by mouth every 6 (six) hours as needed. For pain       . albuterol (PROVENTIL HFA;VENTOLIN HFA) 108 (90 BASE) MCG/ACT inhaler Inhale 2 puffs into the lungs every 6 (six) hours as needed. For shortness of breath       . amLODipine (NORVASC) 5 MG tablet Take 5 mg by mouth daily.        . colchicine 0.6 MG tablet Take 0.6 mg by mouth daily.        Marland Kitchen dextrose 5 % SOLN 50 mL with INVESTIGATIONAL DRUG SIMPLE RECORD Take 1 capsule by mouth daily. Protocol # [id number] Subject # [id number]       . diphenoxylate-atropine (LOMOTIL) 2.5-0.025 MG per tablet Take 1 tablet by mouth every 6 (six) hours as needed. For diarrhea       . fexofenadine (ALLEGRA) 180 MG tablet  Take 180 mg by mouth as needed.        . gabapentin (NEURONTIN) 100 MG capsule Take 100 mg by mouth at bedtime.        Marland Kitchen linagliptin (TRADJENTA) 5 MG TABS tablet Take 5 mg by mouth daily.        Marland Kitchen omeprazole (PRILOSEC) 20 MG capsule Take 20 mg by mouth daily.        . propranolol-hydrochlorothiazide (INDERIDE) 40-25 MG per tablet Take 1 tablet by mouth daily.        . ramipril (ALTACE) 5 MG capsule Take 5 mg by mouth daily.        . simvastatin (ZOCOR) 40 MG tablet Take 40 mg by mouth at bedtime.         Past Surgical History  Procedure Date  . Cholecystectomy   . Abdominal hysterectomy   . Colon surgery     Tumor   Past Medical History  Diagnosis Date  . Gout   . Diabetes mellitus     x over 10 yrs  . Hypertension   . Hyperlipidemia    Family Status  Relation Status Death Age  . Mother Deceased  old age  . Father Deceased     head injury  . Sister Other     One deceased from an MI. One has PE's and  has trouble with swallowing  . Brother Alive     good health   History   Social History Narrative  . No narrative on file   History   Social History  . Marital Status: Married    Spouse Name: N/A    Number of Children: N/A  . Years of Education: N/A   Occupational History  . Not on file.   Social History Main Topics  . Smoking status: Never Smoker   . Smokeless tobacco: Not on file  . Alcohol Use: No  . Drug Use: No  . Sexually Active:    Other Topics Concern  . Not on file   Social History Narrative  . No narrative on file   No Known Allergies      Objective:   Physical Exam Filed Vitals:   05/11/11 1015  BP: 130/84  Pulse: 80  Temp: 98.2 F (36.8 C)  Height: 5\' 3"  (1.6 m)  Weight: 212 lb 3.2 oz (96.253 kg)    Alert and oriented. Skin warm and dry. Oral mucosa is moist.  Sclera anicteric, conjunctivae is pink. Thyroid not enlarged. No cervical lymphadenopathy. Lungs clear. Heart regular rate and rhythm.  Abdomen is soft. Obese Bowel  sounds are positive. No hepatomegaly. No abdominal masses felt. No tenderness.  No edema to lower extremities. Patient is alert and oriented.      Assessment:    Atypical chest pain.   PUD needs to be ruled out.  Cardiac origin has been ruled out at this time.    Plan:       EGD in the near future.The risks and benefits such as perforation, bleeding, and infection were reviewed with the patient and is agreeable.

## 2011-05-11 NOTE — Patient Instructions (Signed)
Will schedule an EGD with Dr. Karilyn Cota. She does not want a surveillance colonoscopy at this time. She would like to wait till the first of the year.

## 2011-06-14 ENCOUNTER — Encounter (HOSPITAL_COMMUNITY): Payer: Self-pay | Admitting: Pharmacy Technician

## 2011-06-15 MED ORDER — SODIUM CHLORIDE 0.45 % IV SOLN
Freq: Once | INTRAVENOUS | Status: AC
  Administered 2011-06-16: 09:00:00 via INTRAVENOUS

## 2011-06-16 ENCOUNTER — Encounter (HOSPITAL_COMMUNITY)
Admission: RE | Disposition: A | Discharge status: Home / Self Care | Payer: Self-pay | Source: Ambulatory Visit | Attending: Internal Medicine

## 2011-06-16 ENCOUNTER — Ambulatory Visit (HOSPITAL_COMMUNITY)
Admission: RE | Admit: 2011-06-16 | Discharge: 2011-06-16 | Disposition: A | Discharge status: Home / Self Care | Payer: Medicare Other | Source: Ambulatory Visit | Attending: Internal Medicine | Admitting: Internal Medicine

## 2011-06-16 ENCOUNTER — Encounter (INDEPENDENT_AMBULATORY_CARE_PROVIDER_SITE_OTHER): Payer: Self-pay | Admitting: *Deleted

## 2011-06-16 ENCOUNTER — Encounter (HOSPITAL_COMMUNITY): Payer: Self-pay | Admitting: *Deleted

## 2011-06-16 DIAGNOSIS — K449 Diaphragmatic hernia without obstruction or gangrene: Secondary | ICD-10-CM | POA: Insufficient documentation

## 2011-06-16 DIAGNOSIS — R112 Nausea with vomiting, unspecified: Secondary | ICD-10-CM

## 2011-06-16 DIAGNOSIS — R1013 Epigastric pain: Secondary | ICD-10-CM | POA: Insufficient documentation

## 2011-06-16 DIAGNOSIS — K219 Gastro-esophageal reflux disease without esophagitis: Secondary | ICD-10-CM

## 2011-06-16 DIAGNOSIS — K222 Esophageal obstruction: Secondary | ICD-10-CM

## 2011-06-16 HISTORY — DX: Gastro-esophageal reflux disease without esophagitis: K21.9

## 2011-06-16 HISTORY — PX: ESOPHAGOGASTRODUODENOSCOPY: SHX5428

## 2011-06-16 LAB — GLUCOSE, CAPILLARY

## 2011-06-16 SURGERY — EGD (ESOPHAGOGASTRODUODENOSCOPY)
Anesthesia: Moderate Sedation

## 2011-06-16 MED ORDER — MEPERIDINE HCL 25 MG/ML IJ SOLN
INTRAMUSCULAR | Status: DC | PRN
  Administered 2011-06-16 (×2): 25 mg via INTRAVENOUS

## 2011-06-16 MED ORDER — STERILE WATER FOR IRRIGATION IR SOLN
Status: DC | PRN
  Administered 2011-06-16: 10:00:00

## 2011-06-16 MED ORDER — MIDAZOLAM HCL 5 MG/5ML IJ SOLN
INTRAMUSCULAR | Status: DC | PRN
  Administered 2011-06-16: 1 mg via INTRAVENOUS
  Administered 2011-06-16 (×2): 2 mg via INTRAVENOUS
  Administered 2011-06-16: 1 mg via INTRAVENOUS

## 2011-06-16 MED ORDER — PANTOPRAZOLE SODIUM 40 MG PO TBEC: 40.0000 mg | DELAYED_RELEASE_TABLET | Freq: Two times a day (BID) | ORAL | Status: DC

## 2011-06-16 MED ORDER — BUTAMBEN-TETRACAINE-BENZOCAINE 2-2-14 % EX AERO
INHALATION_SPRAY | CUTANEOUS | Status: DC | PRN
  Administered 2011-06-16: 2 via TOPICAL

## 2011-06-16 MED ORDER — MIDAZOLAM HCL 5 MG/5ML IJ SOLN
INTRAMUSCULAR | Status: AC
  Filled 2011-06-16: qty 10

## 2011-06-16 MED ORDER — MEPERIDINE HCL 50 MG/ML IJ SOLN
INTRAMUSCULAR | Status: AC
  Filled 2011-06-16: qty 1

## 2011-06-16 NOTE — Op Note (Signed)
EGD PROCEDURE REPORT  PATIENT:  Peggy French  MR#:  [id number] Birthdate:  February 26, 1941, 70 y.o., female Endoscopist:  Dr. Malissa Hippo, MD Referred By:  Dr. Mee Hives, MD Procedure Date: 06/16/2011  Procedure:   EGD  Indications:  Patient is 70 year old African female with recurrent chest pain felt to be secondary to GERD. She underwent cardiac cath 3 months ago and no significant disease was found. Her PPI has been doubled and she still having intermittent pain. She's had sporadic nausea and vomiting. Patient's omeprazole has been doubled without complete relief of her symptoms. She is therefore undergoing diagnostic EGD.  She also had LFTs in September, 2012 and these were normal.           Informed Consent: Procedure and risks were reviewed with the patient and informed consent was obtained.  Medications:  Demerol 50 mg IV Versed 6 mg IV Cetacaine spray topically for oropharyngeal anesthesia  Description of procedure:  The endoscope was introduced through the mouth and advanced to the second portion of the duodenum without difficulty or limitations. The mucosal surfaces were surveyed very carefully during advancement of the scope and upon withdrawal.  Findings:  Esophagus:  Mucosa of the esophagus was normal. Noncritical ring noted at GE junction. GEJ:  33 cm Hiatus:  36 cm Stomach:  Stomach was empty and distended very well with insufflation. Folds in the proximal stomach were normal. Mucosa at body, antrum, pyloric channel, angularis, fundus and cardia was normal. Duodenum:  Normal bulbar and post bulbar mucosa.  Therapeutic/Diagnostic Maneuvers Performed:  None  Complications:  None  Impression: Small sliding hiatal hernia. Noncritical Schatzki's ring. No evidence of erosive esophagitis or peptic ulcer disease.  Recommendations:  Continue anti-reflux measures. Discontinue omeprazole. Start pantoprazole 40 mg by mouth twice a day. We'll check LFTs if she has  another episode of chest pain. patient advised to keep symptom diary and return for office visit in 8 weeks  REHMAN,NAJEEB U  06/16/2011  9:46 AM  CC: Dr. [name], MD & Dr. Bonnetta Barry ref. provider found

## 2011-06-16 NOTE — H&P (Signed)
Peggy French is an 70 y.o. female.   Chief Complaint: Patient is here for diagnostic EGD. HPI: Patient is 70 year old female 6 month history of intermittent retrosternal pain with cardiac catheter few months ago and the pain was felt to be noncardiac possibly do to GERD. Her PPI dose has been doubled but she is still having pain although is not as bad. She's had few episodes of nausea and vomiting. No history of melena or rectal bleeding. She does not take any NSAIDs. She has intermittent diarrhea for which uses diphenoxylate on as-needed basis. She denies dysphagia or odynophagia.  Past Medical History  Diagnosis Date  . Gout   . Diabetes mellitus     x over 10 yrs  . Hypertension   . Hyperlipidemia   . GERD (gastroesophageal reflux disease)   . Chest pain     Past Surgical History  Procedure Date  . Cholecystectomy   . Abdominal hysterectomy   . Colon surgery     Tumor    History reviewed. No pertinent family history. Social History:  reports that she has never smoked. She does not have any smokeless tobacco history on file. She reports that she does not drink alcohol or use illicit drugs.  Allergies: No Known Allergies  Medications Prior to Admission  Medication Dose Route Frequency Provider Last Rate Last Dose  . 0.45 % sodium chloride infusion   Intravenous Once Malissa Hippo, MD 20 mL/hr at 06/16/11 0858    . meperidine (DEMEROL) 50 MG/ML injection           . midazolam (VERSED) 5 MG/5ML injection            Medications Prior to Admission  Medication Sig Dispense Refill  . acetaminophen (TYLENOL) 500 MG tablet Take 1,000 mg by mouth every 6 (six) hours as needed. For pain       . allopurinol (ZYLOPRIM) 100 MG tablet Take 100 mg by mouth daily.        Marland Kitchen amLODipine (NORVASC) 5 MG tablet Take 5 mg by mouth daily.        . colchicine 0.6 MG tablet Take 0.6 mg by mouth daily.       Marland Kitchen dextrose 5 % SOLN 50 mL with INVESTIGATIONAL DRUG SIMPLE RECORD Take 1 capsule by mouth  daily. Protocol # [id number] Subject # [id number] 2 Different Investigational Medications for Gout both taken daily.      . diphenoxylate-atropine (LOMOTIL) 2.5-0.025 MG per tablet Take 1 tablet by mouth every 6 (six) hours as needed. For diarrhea       . gabapentin (NEURONTIN) 100 MG capsule Take 100 mg by mouth at bedtime.        Marland Kitchen linagliptin (TRADJENTA) 5 MG TABS tablet Take 5 mg by mouth daily.        Marland Kitchen omeprazole (PRILOSEC) 20 MG capsule Take 40 mg by mouth daily.       . propranolol-hydrochlorothiazide (INDERIDE) 40-25 MG per tablet Take 1 tablet by mouth daily.        . ramipril (ALTACE) 5 MG capsule Take 5 mg by mouth daily.        . simvastatin (ZOCOR) 40 MG tablet Take 40 mg by mouth at bedtime.        Marland Kitchen albuterol (PROVENTIL HFA;VENTOLIN HFA) 108 (90 BASE) MCG/ACT inhaler Inhale 2 puffs into the lungs every 6 (six) hours as needed. For shortness of breath         No results found for this or  any previous visit (from the past 48 hour(s)). No results found.  Review of Systems  Constitutional: Negative for weight loss.  Gastrointestinal: Negative for abdominal pain, constipation, blood in stool and melena. Diarrhea: intermittent diarrhea controlled with Lomotil.    Blood pressure 134/79, pulse 76, temperature 98.6 F (37 C), temperature source Oral, resp. rate 15, height 5\' 3"  (1.6 m), weight 211 lb (95.709 kg), SpO2 97.00%. Physical Exam  Constitutional: She appears well-developed and well-nourished.  HENT:  Mouth/Throat: Oropharynx is clear and moist.  Eyes: Conjunctivae are normal. No scleral icterus.  Neck: No thyromegaly present.  Cardiovascular: Normal rate, regular rhythm and normal heart sounds.   No murmur heard. Respiratory: Effort normal and breath sounds normal.  GI: Soft. She exhibits no distension and no mass. There is tenderness (mild midepigastric tenderness).  Lymphadenopathy:    She has no cervical adenopathy.     Assessment/Plan Noncardiac chest  pain. Chronic GERD. Diagnostic esophagogastroduodenoscopy.  Peggy French U 06/16/2011, 9:27 AM

## 2011-06-17 ENCOUNTER — Telehealth (INDEPENDENT_AMBULATORY_CARE_PROVIDER_SITE_OTHER): Payer: Self-pay | Admitting: *Deleted

## 2011-06-17 NOTE — Telephone Encounter (Signed)
Spoke with patient. Just a small loose stool. Will call with PR tomorrow. Continue the protonix.  This has been addressed.

## 2011-06-17 NOTE — Telephone Encounter (Signed)
Patient had a procedue done yesterday, 06/16/11, and Dr. Karilyn Cota started her on Tantoprazole Sod. 400 mg tablet. She takes 1 by month twice daily 30 min before meals. Dr. Karilyn Cota told Peggy French to call the office if she had diarrhea and/or headaches. Tiffay has both and would like to know if she should continue this medication or what to do. The return phone number is [phone].

## 2011-06-29 ENCOUNTER — Encounter (HOSPITAL_COMMUNITY): Payer: Self-pay | Admitting: Internal Medicine

## 2011-07-16 ENCOUNTER — Telehealth (INDEPENDENT_AMBULATORY_CARE_PROVIDER_SITE_OTHER): Payer: Self-pay | Admitting: *Deleted

## 2011-07-16 ENCOUNTER — Other Ambulatory Visit (INDEPENDENT_AMBULATORY_CARE_PROVIDER_SITE_OTHER): Payer: Self-pay | Admitting: Internal Medicine

## 2011-07-16 DIAGNOSIS — K219 Gastro-esophageal reflux disease without esophagitis: Secondary | ICD-10-CM

## 2011-07-16 MED ORDER — ESOMEPRAZOLE MAGNESIUM 40 MG PO CPDR: 40.0000 mg | DELAYED_RELEASE_CAPSULE | Freq: Every day | ORAL | Status: DC

## 2011-07-16 NOTE — Telephone Encounter (Signed)
Patient states pantoprazole is not working as was the case with omeprazole. Will switch her to Nexium at 40 mg by mouth twice a day. Prescription sent to [facility] in [place]

## 2011-07-16 NOTE — Telephone Encounter (Signed)
Mrs. Peggy French called the office and left a voice message. She states that she had a upper (EGD) on 06-16-11. At that time she was placed on Pantoprazole 40 mg twice daily.  Mrs. Peggy French is down to maybe 5 more pills, and she is complaining of Chest pain and belching again. She ask if Dr. Karilyn Cota may want to change her medication. Call back number is [phone].   I will address with Dr.Rehman.

## 2011-07-21 ENCOUNTER — Telehealth (INDEPENDENT_AMBULATORY_CARE_PROVIDER_SITE_OTHER): Payer: Self-pay | Admitting: *Deleted

## 2011-07-21 NOTE — Telephone Encounter (Signed)
LM wanting to schedule her tcs for March. The return phone number is [phone].

## 2011-07-23 ENCOUNTER — Other Ambulatory Visit (INDEPENDENT_AMBULATORY_CARE_PROVIDER_SITE_OTHER): Payer: Self-pay | Admitting: *Deleted

## 2011-07-23 ENCOUNTER — Telehealth (INDEPENDENT_AMBULATORY_CARE_PROVIDER_SITE_OTHER): Payer: Self-pay | Admitting: *Deleted

## 2011-07-23 DIAGNOSIS — Z86010 Personal history of colonic polyps: Secondary | ICD-10-CM

## 2011-07-23 NOTE — Telephone Encounter (Signed)
Patient needs movi prep 

## 2011-07-26 MED ORDER — PEG-KCL-NACL-NASULF-NA ASC-C 100 G PO SOLR: 1.0000 | Freq: Once | ORAL | Status: DC

## 2011-07-29 ENCOUNTER — Encounter (INDEPENDENT_AMBULATORY_CARE_PROVIDER_SITE_OTHER): Payer: Self-pay | Admitting: *Deleted

## 2011-08-11 ENCOUNTER — Ambulatory Visit (INDEPENDENT_AMBULATORY_CARE_PROVIDER_SITE_OTHER): Payer: Medicare Other | Admitting: Internal Medicine

## 2011-08-11 ENCOUNTER — Encounter (INDEPENDENT_AMBULATORY_CARE_PROVIDER_SITE_OTHER): Payer: Self-pay | Admitting: Internal Medicine

## 2011-08-11 VITALS — BP 108/70 | HR 72 | Temp 98.3°F | Ht 63.0 in | Wt 209.4 lb

## 2011-08-11 DIAGNOSIS — K219 Gastro-esophageal reflux disease without esophagitis: Secondary | ICD-10-CM | POA: Diagnosis not present

## 2011-08-11 NOTE — Patient Instructions (Signed)
Continue present medication 

## 2011-08-11 NOTE — Progress Notes (Signed)
Subjective:     Patient ID: Peggy French, female   DOB: 1941-06-02, 71 y.o.   MRN: [id number]  Peggy French is a 71 yr old female here today for f/u.  She underwent and EGD in December for GERD like symptoms and chest pain. She is to follow up with her Cardiologist next month.  She tells me she feels good. She denies epigastric pain. Her appetite is good. No weight loss.  She occasionally has gas pain.  She usually has a BM about  One a day. No melena or bright red rectal bleeding. She is scheduled for a screening colonoscopy in March.  EGD 06/16/2011 Impression:  Small sliding hiatal hernia.  Noncritical Schatzki's ring.  No evidence of erosive esophagitis or peptic ulcer disease.     Review of Systems see hpi Current Outpatient Prescriptions  Medication Sig Dispense Refill  . acetaminophen (TYLENOL) 500 MG tablet Take 1,000 mg by mouth every 6 (six) hours as needed. For pain       . albuterol (PROVENTIL HFA;VENTOLIN HFA) 108 (90 BASE) MCG/ACT inhaler Inhale 2 puffs into the lungs every 6 (six) hours as needed. For shortness of breath       . allopurinol (ZYLOPRIM) 100 MG tablet Take 100 mg by mouth daily.        Marland Kitchen amLODipine (NORVASC) 5 MG tablet Take 5 mg by mouth daily.        . colchicine 0.6 MG tablet Take 0.6 mg by mouth daily.       . diphenoxylate-atropine (LOMOTIL) 2.5-0.025 MG per tablet Take 1 tablet by mouth every 6 (six) hours as needed. For diarrhea       . esomeprazole (NEXIUM) 40 MG capsule Take 1 capsule (40 mg total) by mouth daily.  60 capsule  5  . fexofenadine (ALLEGRA) 180 MG tablet Take 180 mg by mouth as needed. Allergies      . gabapentin (NEURONTIN) 100 MG capsule Take 100 mg by mouth at bedtime.        Marland Kitchen linagliptin (TRADJENTA) 5 MG TABS tablet Take 5 mg by mouth daily.        . propranolol-hydrochlorothiazide (INDERIDE) 40-25 MG per tablet Take 1 tablet by mouth daily.        . ramipril (ALTACE) 5 MG capsule Take 5 mg by mouth daily.        . simvastatin (ZOCOR)  40 MG tablet Take 40 mg by mouth at bedtime.         Past Medical History  Diagnosis Date  . Gout   . Diabetes mellitus     x over 10 yrs  . Hypertension   . Hyperlipidemia   . GERD (gastroesophageal reflux disease)   . Chest pain    Past Surgical History  Procedure Date  . Cholecystectomy   . Abdominal hysterectomy   . Colon surgery     Tumor  . Esophagogastroduodenoscopy 06/16/2011    Procedure: ESOPHAGOGASTRODUODENOSCOPY (EGD);  Surgeon: Malissa Hippo, MD;  [facility];  Service: Endoscopy;  Laterality: N/A;  2:15   History   Social History  . Marital Status: Married    Spouse Name: N/A    Number of Children: N/A  . Years of Education: N/A   Occupational History  . Not on file.   Social History Main Topics  . Smoking status: Never Smoker   . Smokeless tobacco: Not on file  . Alcohol Use: No  . Drug Use: No  . Sexually Active:  Other Topics Concern  . Not on file   Social History Narrative  . No narrative on file   Family Status  Relation Status Death Age  . Mother Deceased     old age  . Father Deceased     head injury  . Sister Other     One deceased from an MI. One has PE's and  has trouble with swallowing  . Brother Alive     good health   No Known Allergies     Objective:   Physical Exam  Filed Vitals:   08/11/11 1058  Height: 5\' 3"  (1.6 m)  Weight: 209 lb 6.4 oz (94.983 kg)    Alert and oriented. Skin warm and dry. Oral mucosa is moist.   . Sclera anicteric, conjunctivae is pink. Thyroid not enlarged. No cervical lymphadenopathy. Lungs clear. Heart regular rate and rhythm.  Abdomen is soft. Bowel sounds are positive. No hepatomegaly. No abdominal masses felt. No tenderness.  No edema to lower extremities. Patient is alert and oriented.      Assessment:   Epigastric pain. She is doing well with the Nexium. She has not had any chest pain.     Plan:    f/u on a prn basis.

## 2011-08-23 ENCOUNTER — Encounter (INDEPENDENT_AMBULATORY_CARE_PROVIDER_SITE_OTHER): Payer: Self-pay | Admitting: *Deleted

## 2011-08-24 ENCOUNTER — Telehealth (INDEPENDENT_AMBULATORY_CARE_PROVIDER_SITE_OTHER): Payer: Self-pay | Admitting: *Deleted

## 2011-08-24 NOTE — Telephone Encounter (Signed)
Requesting MD/PCP:  Earvin Hansen hill     Name & DOB: Peggy French 2040/11/26     Procedure: tcs  Reason/Indication:  Hx polyps  Has patient had this procedure before?  yes  If so, when, by whom and where?  2007  Is there a family history of colon cancer?  no  Who?  What age when diagnosed?    Is patient diabetic?   yes      Does patient have prosthetic heart valve?  no  Do you have a pacemaker?  no  Has patient had joint replacement within last 12 months?  no  Is patient on Coumadin, Plavix and/or Aspirin? no  Medications:   Allergies: nkda  Ph[facility]  Medication Adjustment: hold tradjenta day before  Procedure date & time: 09/23/11 at 730

## 2011-08-24 NOTE — Telephone Encounter (Signed)
According to pathology report in Echart she had TWO SMALL SIGMOID POLYPS: HYPERPLASTIC POLYPS. NO   ADENOMATOUS CHANGE OR MALIGNANCY IDENTIFIED.

## 2011-08-24 NOTE — Telephone Encounter (Signed)
Ask Dr. Karilyn Cota. Hyperplastic polyps do not need surveillance.

## 2011-08-24 NOTE — Telephone Encounter (Signed)
Need to know what kind of polyps she had before her last colonoscopy or you can ask Dr. Karilyn Cota

## 2011-08-25 NOTE — Telephone Encounter (Signed)
Please let me to review patient's records of prior colonoscopies

## 2011-08-25 NOTE — Telephone Encounter (Signed)
Dr Karilyn Cota please review, Peggy French was on recall for repeat TCS,

## 2011-08-26 ENCOUNTER — Other Ambulatory Visit: Payer: Self-pay | Admitting: Family Medicine

## 2011-08-26 ENCOUNTER — Other Ambulatory Visit (HOSPITAL_COMMUNITY)
Admission: RE | Admit: 2011-08-26 | Discharge: 2011-08-26 | Disposition: A | Discharge status: Home / Self Care | Payer: Medicare Other | Source: Ambulatory Visit | Attending: Family Medicine | Admitting: Family Medicine

## 2011-08-26 DIAGNOSIS — Z0000 Encounter for general adult medical examination without abnormal findings: Secondary | ICD-10-CM | POA: Diagnosis not present

## 2011-08-26 DIAGNOSIS — E119 Type 2 diabetes mellitus without complications: Secondary | ICD-10-CM | POA: Diagnosis not present

## 2011-08-26 DIAGNOSIS — Z1159 Encounter for screening for other viral diseases: Secondary | ICD-10-CM | POA: Insufficient documentation

## 2011-08-26 DIAGNOSIS — Z01419 Encounter for gynecological examination (general) (routine) without abnormal findings: Secondary | ICD-10-CM | POA: Insufficient documentation

## 2011-08-30 NOTE — Telephone Encounter (Signed)
Dr Karilyn Cota has reviewed all TCS reports for [facility], TCS not due until 05/2016, TCS for March has been canceled and put on recall for 05/2016, patient is aware

## 2011-09-15 ENCOUNTER — Other Ambulatory Visit (INDEPENDENT_AMBULATORY_CARE_PROVIDER_SITE_OTHER): Payer: Self-pay | Admitting: Internal Medicine

## 2011-09-15 DIAGNOSIS — I10 Essential (primary) hypertension: Secondary | ICD-10-CM | POA: Diagnosis not present

## 2011-09-23 ENCOUNTER — Ambulatory Visit: Admit: 2011-09-23 | Payer: Self-pay | Admitting: Internal Medicine

## 2011-09-23 SURGERY — COLONOSCOPY
Anesthesia: Moderate Sedation

## 2011-09-27 DIAGNOSIS — I10 Essential (primary) hypertension: Secondary | ICD-10-CM | POA: Diagnosis not present

## 2011-09-27 DIAGNOSIS — G609 Hereditary and idiopathic neuropathy, unspecified: Secondary | ICD-10-CM | POA: Diagnosis not present

## 2011-09-27 DIAGNOSIS — E7800 Pure hypercholesterolemia, unspecified: Secondary | ICD-10-CM | POA: Diagnosis not present

## 2011-10-07 ENCOUNTER — Encounter (INDEPENDENT_AMBULATORY_CARE_PROVIDER_SITE_OTHER): Payer: Self-pay

## 2011-10-18 DIAGNOSIS — I10 Essential (primary) hypertension: Secondary | ICD-10-CM | POA: Diagnosis not present

## 2011-10-18 DIAGNOSIS — I2510 Atherosclerotic heart disease of native coronary artery without angina pectoris: Secondary | ICD-10-CM | POA: Diagnosis not present

## 2011-10-18 DIAGNOSIS — K219 Gastro-esophageal reflux disease without esophagitis: Secondary | ICD-10-CM | POA: Diagnosis not present

## 2011-10-18 DIAGNOSIS — E119 Type 2 diabetes mellitus without complications: Secondary | ICD-10-CM | POA: Diagnosis not present

## 2011-11-01 ENCOUNTER — Other Ambulatory Visit (HOSPITAL_COMMUNITY): Payer: Self-pay | Admitting: Family Medicine

## 2011-11-01 DIAGNOSIS — Z139 Encounter for screening, unspecified: Secondary | ICD-10-CM

## 2011-11-03 DIAGNOSIS — H2510 Age-related nuclear cataract, unspecified eye: Secondary | ICD-10-CM | POA: Diagnosis not present

## 2011-11-03 DIAGNOSIS — E11319 Type 2 diabetes mellitus with unspecified diabetic retinopathy without macular edema: Secondary | ICD-10-CM | POA: Diagnosis not present

## 2011-11-03 DIAGNOSIS — H25019 Cortical age-related cataract, unspecified eye: Secondary | ICD-10-CM | POA: Diagnosis not present

## 2011-11-03 DIAGNOSIS — E1139 Type 2 diabetes mellitus with other diabetic ophthalmic complication: Secondary | ICD-10-CM | POA: Diagnosis not present

## 2011-11-04 ENCOUNTER — Other Ambulatory Visit (INDEPENDENT_AMBULATORY_CARE_PROVIDER_SITE_OTHER): Payer: Self-pay | Admitting: *Deleted

## 2011-11-04 ENCOUNTER — Ambulatory Visit (INDEPENDENT_AMBULATORY_CARE_PROVIDER_SITE_OTHER): Payer: Medicare Other | Admitting: Internal Medicine

## 2011-11-04 ENCOUNTER — Encounter (INDEPENDENT_AMBULATORY_CARE_PROVIDER_SITE_OTHER): Payer: Self-pay | Admitting: Internal Medicine

## 2011-11-04 ENCOUNTER — Telehealth (INDEPENDENT_AMBULATORY_CARE_PROVIDER_SITE_OTHER): Payer: Self-pay | Admitting: *Deleted

## 2011-11-04 DIAGNOSIS — R109 Unspecified abdominal pain: Secondary | ICD-10-CM | POA: Diagnosis not present

## 2011-11-04 DIAGNOSIS — R195 Other fecal abnormalities: Secondary | ICD-10-CM

## 2011-11-04 MED ORDER — PEG-KCL-NACL-NASULF-NA ASC-C 100 G PO SOLR: 1.0000 | Freq: Once | ORAL | Status: DC

## 2011-11-04 MED ORDER — HYOSCYAMINE SULFATE 0.125 MG SL SUBL: 0.1250 mg | SUBLINGUAL_TABLET | SUBLINGUAL | Status: DC | PRN

## 2011-11-04 NOTE — Progress Notes (Signed)
Subjective:     Patient ID: Peggy French, female   DOB: 1940-08-13, 71 y.o.   MRN: [id number]  HPI She is here for f/u.  She tells me for the past 2 months, she c/o burning and a sharp shooting pain in her lower abdomen.  If she becomes stressed, pain becomes worse. No melena or bright red rectal bleeding.  She tells me her stools look like chocolate,but not black. Stools are loose but not real loose. She is having 1 good one a day.  Her lower abdomen usually hurt all day.  She tells me she had food poison the 1st or 2nd week in February.  Appetite is okay.  No weight loss. No dysphagia. Acid reflux is controlled with Nexium.   05/2006 Colonoscopy: Hx of colonic polyps:FINAL DIAGNOSIS: Examination performed to cecum. Two small polyps  ablated via cold biopsy from sigmoid colon (one container).  Biopsy: Hyperplastic polyp   Review of Systems see hpi Current Outpatient Prescriptions  Medication Sig Dispense Refill  . acetaminophen (TYLENOL) 500 MG tablet Take 1,000 mg by mouth every 6 (six) hours as needed. For pain       . albuterol (PROVENTIL HFA;VENTOLIN HFA) 108 (90 BASE) MCG/ACT inhaler Inhale 2 puffs into the lungs every 6 (six) hours as needed. For shortness of breath       . allopurinol (ZYLOPRIM) 100 MG tablet Take 100 mg by mouth daily.        Marland Kitchen amLODipine (NORVASC) 5 MG tablet Take 5 mg by mouth daily.        . colchicine 0.6 MG tablet Take 0.6 mg by mouth daily.       . diphenoxylate-atropine (LOMOTIL) 2.5-0.025 MG per tablet Take 1 tablet by mouth every 6 (six) hours as needed. For diarrhea       . esomeprazole (NEXIUM) 40 MG capsule Take 1 capsule (40 mg total) by mouth daily.  60 capsule  5  . fexofenadine (ALLEGRA) 180 MG tablet Take 180 mg by mouth as needed. Allergies      . gabapentin (NEURONTIN) 100 MG capsule Take 100 mg by mouth at bedtime.        Marland Kitchen linagliptin (TRADJENTA) 5 MG TABS tablet Take 5 mg by mouth daily.        Marland Kitchen NEXIUM 40 MG capsule TAKE 1 CAPSULE EVERY  DAY  30 capsule  5  . propranolol-hydrochlorothiazide (INDERIDE) 40-25 MG per tablet Take 1 tablet by mouth daily.        . ramipril (ALTACE) 5 MG capsule Take 5 mg by mouth daily.        . simvastatin (ZOCOR) 40 MG tablet Take 40 mg by mouth at bedtime.         Past Medical History  Diagnosis Date  . Gout   . Diabetes mellitus     x over 10 yrs  . Hypertension   . Hyperlipidemia   . GERD (gastroesophageal reflux disease)   . Chest pain    History   Social History  . Marital Status: Married    Spouse Name: N/A    Number of Children: N/A  . Years of Education: N/A   Occupational History  . Not on file.   Social History Main Topics  . Smoking status: Never Smoker   . Smokeless tobacco: Not on file  . Alcohol Use: No  . Drug Use: No  . Sexually Active:    Other Topics Concern  . Not on file   Social  History Narrative  . No narrative on file   Family Status  Relation Status Death Age  . Mother Deceased     old age  . Father Deceased     head injury  . Sister Other     One deceased from an MI. One has PE's and  has trouble with swallowing  . Brother Alive     good health   No Known Allergies      Objective:   Physical Exam Filed Vitals:   11/04/11 1059  Height: 5\' 5"  (1.651 m)  Weight: 209 lb 8 oz (95.029 kg)  Alert and oriented. Skin warm and dry. Oral mucosa is moist.   . Sclera anicteric, conjunctivae is pink. Thyroid not enlarged. No cervical lymphadenopathy. Lungs clear. Heart regular rate and rhythm.  Abdomen is soft. Bowel sounds are positive. No hepatomegaly. No abdominal masses felt. No tenderness.  No edema to lower extremities. Patient is alert and oriented. Stool brown and grossly guaiac positive.     Assessment:   Change in stool. Heme positive stool.  Colonic neoplasm, AVM, polyp need to be ruled out.     Plan:    Will try Levsin qid and she how she does. Levsin .125mg  sl QID as needed for stomach cramps. Colonoscopy with Dr. Karilyn Cota.

## 2011-11-04 NOTE — Telephone Encounter (Signed)
Patient needs movi prep 

## 2011-11-04 NOTE — Patient Instructions (Signed)
Colonoscopy with DR. Rehman.  

## 2011-11-08 ENCOUNTER — Ambulatory Visit (HOSPITAL_COMMUNITY)
Admission: RE | Admit: 2011-11-08 | Discharge: 2011-11-08 | Disposition: A | Discharge status: Home / Self Care | Payer: Medicare Other | Source: Ambulatory Visit | Attending: Family Medicine | Admitting: Family Medicine

## 2011-11-08 DIAGNOSIS — Z1231 Encounter for screening mammogram for malignant neoplasm of breast: Secondary | ICD-10-CM | POA: Diagnosis not present

## 2011-11-08 DIAGNOSIS — Z139 Encounter for screening, unspecified: Secondary | ICD-10-CM

## 2011-11-24 ENCOUNTER — Encounter (HOSPITAL_COMMUNITY): Payer: Self-pay | Admitting: Pharmacy Technician

## 2011-11-24 DIAGNOSIS — R109 Unspecified abdominal pain: Secondary | ICD-10-CM | POA: Diagnosis not present

## 2011-11-24 DIAGNOSIS — I10 Essential (primary) hypertension: Secondary | ICD-10-CM | POA: Diagnosis not present

## 2011-11-24 DIAGNOSIS — E119 Type 2 diabetes mellitus without complications: Secondary | ICD-10-CM | POA: Diagnosis not present

## 2011-11-25 MED ORDER — SODIUM CHLORIDE 0.45 % IV SOLN
Freq: Once | INTRAVENOUS | Status: AC
  Administered 2011-11-26: 1000 mL via INTRAVENOUS

## 2011-11-26 ENCOUNTER — Encounter (HOSPITAL_COMMUNITY)
Admission: RE | Disposition: A | Discharge status: Home / Self Care | Payer: Self-pay | Source: Ambulatory Visit | Attending: Internal Medicine

## 2011-11-26 ENCOUNTER — Ambulatory Visit (HOSPITAL_COMMUNITY)
Admission: RE | Admit: 2011-11-26 | Discharge: 2011-11-26 | Disposition: A | Discharge status: Home / Self Care | Payer: Medicare Other | Source: Ambulatory Visit | Attending: Internal Medicine | Admitting: Internal Medicine

## 2011-11-26 ENCOUNTER — Encounter (HOSPITAL_COMMUNITY): Payer: Self-pay | Admitting: *Deleted

## 2011-11-26 DIAGNOSIS — Z79899 Other long term (current) drug therapy: Secondary | ICD-10-CM | POA: Insufficient documentation

## 2011-11-26 DIAGNOSIS — K644 Residual hemorrhoidal skin tags: Secondary | ICD-10-CM | POA: Insufficient documentation

## 2011-11-26 DIAGNOSIS — D126 Benign neoplasm of colon, unspecified: Secondary | ICD-10-CM | POA: Insufficient documentation

## 2011-11-26 DIAGNOSIS — C180 Malignant neoplasm of cecum: Secondary | ICD-10-CM | POA: Insufficient documentation

## 2011-11-26 DIAGNOSIS — K921 Melena: Secondary | ICD-10-CM | POA: Insufficient documentation

## 2011-11-26 DIAGNOSIS — K5730 Diverticulosis of large intestine without perforation or abscess without bleeding: Secondary | ICD-10-CM | POA: Diagnosis not present

## 2011-11-26 DIAGNOSIS — E119 Type 2 diabetes mellitus without complications: Secondary | ICD-10-CM | POA: Diagnosis not present

## 2011-11-26 DIAGNOSIS — R109 Unspecified abdominal pain: Secondary | ICD-10-CM | POA: Diagnosis not present

## 2011-11-26 DIAGNOSIS — E785 Hyperlipidemia, unspecified: Secondary | ICD-10-CM | POA: Diagnosis not present

## 2011-11-26 DIAGNOSIS — I10 Essential (primary) hypertension: Secondary | ICD-10-CM | POA: Diagnosis not present

## 2011-11-26 DIAGNOSIS — Z01812 Encounter for preprocedural laboratory examination: Secondary | ICD-10-CM | POA: Diagnosis not present

## 2011-11-26 DIAGNOSIS — R195 Other fecal abnormalities: Secondary | ICD-10-CM

## 2011-11-26 HISTORY — PX: COLONOSCOPY: SHX5424

## 2011-11-26 SURGERY — COLONOSCOPY
Anesthesia: Moderate Sedation

## 2011-11-26 MED ORDER — STERILE WATER FOR IRRIGATION IR SOLN
Status: DC | PRN
  Administered 2011-11-26: 15:00:00

## 2011-11-26 MED ORDER — MEPERIDINE HCL 50 MG/ML IJ SOLN
INTRAMUSCULAR | Status: DC | PRN
  Administered 2011-11-26 (×2): 25 mg via INTRAVENOUS

## 2011-11-26 MED ORDER — MIDAZOLAM HCL 5 MG/5ML IJ SOLN
INTRAMUSCULAR | Status: AC
  Filled 2011-11-26: qty 10

## 2011-11-26 MED ORDER — MIDAZOLAM HCL 5 MG/5ML IJ SOLN
INTRAMUSCULAR | Status: DC | PRN
  Administered 2011-11-26 (×5): 2 mg via INTRAVENOUS

## 2011-11-26 MED ORDER — MEPERIDINE HCL 50 MG/ML IJ SOLN
INTRAMUSCULAR | Status: AC
  Filled 2011-11-26: qty 1

## 2011-11-26 NOTE — Discharge Instructions (Signed)
Resume usual medications and diet. No driving for 24 hours. Physician will contact you with results of biopsy. 

## 2011-11-26 NOTE — H&P (Signed)
Peggy French is an 71 y.o. female.   Chief Complaint: Patient is here for colonoscopy. HPI: Patient is 71 year old African female was noted change in bowel habits and was found to have heme positive stool. She also complains of sharp pain across lower abdomen. She has intermittent hematochezia felt to be secondary to hemorrhoids. She has a good appetite and has not lost any weight. She has history of diarrhea easily controlled with when necessary Lomotil. Patient's last colonoscopy was in 2007 with removal of 2 small polyps and these are hyperplastic. Family history is negative for colorectal carcinoma.  Past Medical History  Diagnosis Date  . Gout   . Diabetes mellitus     x over 10 yrs  . Hypertension   . Hyperlipidemia   . GERD (gastroesophageal reflux disease)   . Chest pain     Past Surgical History  Procedure Date  . Cholecystectomy   . Abdominal hysterectomy   . Colon surgery     Tumor  . Esophagogastroduodenoscopy 06/16/2011    Procedure: ESOPHAGOGASTRODUODENOSCOPY (EGD);  Surgeon: Malissa Hippo, MD;  Location: [facility];  Service: Endoscopy;  Laterality: N/A;  2:15    History reviewed. No pertinent family history. Social History:  reports that she has never smoked. She does not have any smokeless tobacco history on file. She reports that she does not drink alcohol or use illicit drugs.  Allergies:  Allergies  Allergen Reactions  . Aspirin Palpitations    Medications Prior to Admission  Medication Sig Dispense Refill  . acetaminophen (TYLENOL) 500 MG tablet Take 1,000 mg by mouth every 6 (six) hours as needed. For pain       . albuterol (PROVENTIL HFA;VENTOLIN HFA) 108 (90 BASE) MCG/ACT inhaler Inhale 2 puffs into the lungs every 6 (six) hours as needed. For shortness of breath       . allopurinol (ZYLOPRIM) 100 MG tablet Take 100 mg by mouth every morning.       Marland Kitchen amLODipine (NORVASC) 5 MG tablet Take 5 mg by mouth every morning.       . colchicine 0.6 MG  tablet Take 0.6 mg by mouth daily as needed. For severe gout flares      . diphenoxylate-atropine (LOMOTIL) 2.5-0.025 MG per tablet Take 1 tablet by mouth every 6 (six) hours as needed. For diarrhea       . esomeprazole (NEXIUM) 40 MG capsule Take 40 mg by mouth every evening.      . fexofenadine (ALLEGRA) 180 MG tablet Take 180 mg by mouth daily as needed. For Allergies      . gabapentin (NEURONTIN) 100 MG capsule Take 100 mg by mouth at bedtime.       . hyoscyamine (LEVSIN, ANASPAZ) 0.125 MG tablet Take 0.125 mg by mouth every 4 (four) hours as needed. For cramping      . linagliptin (TRADJENTA) 5 MG TABS tablet Take 5 mg by mouth every morning.       . peg 3350 powder (MOVIPREP) SOLR Take 1 kit (100 g total) by mouth once.  1 kit  0  . propranolol-hydrochlorothiazide (INDERIDE) 40-25 MG per tablet Take 1 tablet by mouth every morning.       . ramipril (ALTACE) 5 MG capsule Take 5 mg by mouth every morning.       . simvastatin (ZOCOR) 40 MG tablet Take 40 mg by mouth at bedtime.         Results for orders placed during the hospital encounter  of 11/26/11 (from the past 48 hour(s))  GLUCOSE, CAPILLARY     Status: Abnormal   Collection Time   11/26/11  1:37 PM      Component Value Range Comment   Glucose-Capillary 124 (*) 70 - 99 (mg/dL)    No results found.  ROS  Blood pressure 138/80, pulse 91, temperature 97.9 F (36.6 C), temperature source Oral, resp. rate 18, height 5\' 4"  (1.626 m), weight 203 lb (92.08 kg), SpO2 98.00%. Physical Exam  Constitutional: She appears well-developed and well-nourished.  HENT:  Mouth/Throat: Oropharynx is clear and moist.  Eyes: Conjunctivae are normal. No scleral icterus.  Neck: No thyromegaly present.  Cardiovascular: Regular rhythm and normal heart sounds.   No murmur heard. Respiratory: Effort normal and breath sounds normal.  GI: She exhibits no distension and no mass. There is tenderness (mild tenderness across  lower abdomen).    Musculoskeletal: She exhibits no edema.  Lymphadenopathy:    She has no cervical adenopathy.  Neurological: She is alert.  Skin: Skin is warm.     Assessment/Plan Heme positive stool. Lower abdominal pain. Diagnostic  colonoscopy  Peggy French U 11/26/2011, 2:26 PM

## 2011-11-26 NOTE — Op Note (Signed)
COLONOSCOPY PROCEDURE REPORT  PATIENT:  Peggy French  MR#:  [id number] Birthdate:  April 25, 1941, 71 y.o., female Endoscopist:  Dr. Malissa Hippo, MD Referred By:  Dr. Mirna Mires, MD Procedure Date: 11/26/2011  Procedure:   Colonoscopy  Indications:  Patient is 71 year old female presents with heme positive stool and pain across her lower abdomen. Patient's last colonoscopy was in November 2007 with removal of 2 small polyps which are hyperplastic. Family history is negative for colorectal carcinoma to  Informed Consent:  The procedure and risks were reviewed with the patient and informed consent was obtained.  Medications:  Demerol 50 mg IV Versed 10 mg IV  Description of procedure:  After a digital rectal exam was performed, that colonoscope was advanced from the anus through the rectum and colon to the area of the cecum, ileocecal valve and appendiceal orifice. The cecum was deeply intubated. These structures were well-seen and photographed for the record. From the level of the cecum and ileocecal valve, the scope was slowly and cautiously withdrawn. The mucosal surfaces were carefully surveyed utilizing scope tip to flexion to facilitate fold flattening as needed. The scope was pulled down into the rectum where a thorough exam including retroflexion was performed. Terminal ileum was also examined.  Findings:   Normal terminal ileum. Large circumferential friable mass at ileocecal valve. Multiple biopsies obtained. 2 small polyps at splenic flexure moved via cold biopsy. Few small diverticula at sigmoid colon. Small hemorrhoids below the dentate line.   Therapeutic/Diagnostic Maneuvers Performed:  See above  Complications:  None  Cecal Withdrawal Time:  11 minutes  Impression:  Normal terminal ileum. Large friable mass and ileocecal valve to for histology. Two small polyps ablated via cold biopsy from splenic flexure. Mild sigmoid colon diverticulosis. External  hemorrhoids.  Recommendations:  Standard instructions given. Will arrange for abdominal pelvic CT next week. Patient will need right  Hemicolectomy.  Kazzandra Desaulniers U  11/26/2011 3:22 PM  CC: Dr. [name], MD, MD & Dr. Bonnetta Barry ref. provider found

## 2011-11-29 ENCOUNTER — Ambulatory Visit (HOSPITAL_COMMUNITY): Admission: RE | Admit: 2011-11-29 | Payer: Medicare Other | Source: Ambulatory Visit

## 2011-11-29 ENCOUNTER — Other Ambulatory Visit (HOSPITAL_COMMUNITY): Payer: Medicare Other

## 2011-12-01 ENCOUNTER — Other Ambulatory Visit (INDEPENDENT_AMBULATORY_CARE_PROVIDER_SITE_OTHER): Payer: Self-pay | Admitting: *Deleted

## 2011-12-01 ENCOUNTER — Encounter (HOSPITAL_COMMUNITY): Payer: Self-pay | Admitting: Internal Medicine

## 2011-12-01 ENCOUNTER — Telehealth (INDEPENDENT_AMBULATORY_CARE_PROVIDER_SITE_OTHER): Payer: Self-pay | Admitting: *Deleted

## 2011-12-01 DIAGNOSIS — Z0189 Encounter for other specified special examinations: Secondary | ICD-10-CM

## 2011-12-01 DIAGNOSIS — C189 Malignant neoplasm of colon, unspecified: Secondary | ICD-10-CM

## 2011-12-01 NOTE — Telephone Encounter (Signed)
Lab noted and faxed to [facility]. 

## 2011-12-01 NOTE — Telephone Encounter (Signed)
Lab needed prior to CT , order faxed to [facility]

## 2011-12-01 NOTE — Telephone Encounter (Signed)
Patient is having CT A/P 12/03/11 & will need order for Creatinine, she will go today or tomorrow for labs

## 2011-12-02 ENCOUNTER — Other Ambulatory Visit (INDEPENDENT_AMBULATORY_CARE_PROVIDER_SITE_OTHER): Payer: Self-pay | Admitting: Internal Medicine

## 2011-12-02 DIAGNOSIS — Z0189 Encounter for other specified special examinations: Secondary | ICD-10-CM | POA: Diagnosis not present

## 2011-12-03 ENCOUNTER — Encounter (INDEPENDENT_AMBULATORY_CARE_PROVIDER_SITE_OTHER): Payer: Self-pay | Admitting: *Deleted

## 2011-12-03 ENCOUNTER — Ambulatory Visit (HOSPITAL_COMMUNITY)
Admission: RE | Admit: 2011-12-03 | Discharge: 2011-12-03 | Disposition: A | Discharge status: Home / Self Care | Payer: Medicare Other | Source: Ambulatory Visit | Attending: Internal Medicine | Admitting: Internal Medicine

## 2011-12-03 DIAGNOSIS — R599 Enlarged lymph nodes, unspecified: Secondary | ICD-10-CM | POA: Diagnosis not present

## 2011-12-03 DIAGNOSIS — C182 Malignant neoplasm of ascending colon: Secondary | ICD-10-CM | POA: Insufficient documentation

## 2011-12-03 DIAGNOSIS — D490 Neoplasm of unspecified behavior of digestive system: Secondary | ICD-10-CM | POA: Diagnosis not present

## 2011-12-03 DIAGNOSIS — C189 Malignant neoplasm of colon, unspecified: Secondary | ICD-10-CM

## 2011-12-03 MED ORDER — IOHEXOL 300 MG/ML  SOLN
100.0000 mL | Freq: Once | INTRAMUSCULAR | Status: AC | PRN
  Administered 2011-12-03: 100 mL via INTRAVENOUS

## 2011-12-08 ENCOUNTER — Encounter (INDEPENDENT_AMBULATORY_CARE_PROVIDER_SITE_OTHER): Payer: Self-pay | Admitting: Surgery

## 2011-12-08 ENCOUNTER — Other Ambulatory Visit (INDEPENDENT_AMBULATORY_CARE_PROVIDER_SITE_OTHER): Payer: Self-pay | Admitting: Surgery

## 2011-12-08 ENCOUNTER — Ambulatory Visit (INDEPENDENT_AMBULATORY_CARE_PROVIDER_SITE_OTHER): Payer: Medicare Other | Admitting: Surgery

## 2011-12-08 VITALS — BP 146/80 | HR 72 | Temp 96.0°F | Resp 16 | Ht 65.5 in | Wt 200.4 lb

## 2011-12-08 DIAGNOSIS — C182 Malignant neoplasm of ascending colon: Secondary | ICD-10-CM

## 2011-12-08 NOTE — Progress Notes (Signed)
Chief Complaint:  Right colon cancer  History of Present Illness:  Peggy French is an 71 y.o. 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 Impression:  Normal terminal ileum.  Large friable mass and ileocecal valve to for histology.  Two small polyps ablated via cold biopsy from splenic flexure.  Mild sigmoid colon diverticulosis.  External hemorrhoids.   She describes a transrectal tumor resected by surgeons in [place] some years ago.  She is aware of the procedure and surgery booklets were given to her.    Past Medical History  Diagnosis Date  . Gout   . Diabetes mellitus     x over 10 yrs  . Hypertension   . Hyperlipidemia   . GERD (gastroesophageal reflux disease)   . Chest pain     Past Surgical History  Procedure Date  . Cholecystectomy   . Abdominal hysterectomy   . Colon surgery     Tumor  . Esophagogastroduodenoscopy 06/16/2011    Procedure: ESOPHAGOGASTRODUODENOSCOPY (EGD);  Surgeon: Malissa Hippo, MD;  [facility];  Service: Endoscopy;  Laterality: N/A;  2:15  . Colonoscopy 11/26/2011    Procedure: COLONOSCOPY;  Surgeon: Malissa Hippo, MD;  [facility]: [facility];  Service: Endoscopy;  Laterality: N/A;  2:15    Current Outpatient Prescriptions  Medication Sig Dispense Refill  . acetaminophen (TYLENOL) 500 MG tablet Take 1,000 mg by mouth every 6 (six) hours as needed. For pain       . albuterol (PROVENTIL HFA;VENTOLIN HFA) 108 (90 BASE) MCG/ACT inhaler Inhale 2 puffs into the lungs every 6 (six) hours as needed. For shortness of breath       . allopurinol (ZYLOPRIM) 100 MG tablet Take 100 mg by mouth every morning.       Marland Kitchen amLODipine (NORVASC) 5 MG tablet Take 5 mg by mouth every morning.       . colchicine 0.6 MG tablet Take 0.6 mg by mouth daily as needed. For severe gout flares      . diphenoxylate-atropine (LOMOTIL) 2.5-0.025 MG per tablet Take 1 tablet by mouth every 6 (six) hours as needed. For  diarrhea       . esomeprazole (NEXIUM) 40 MG capsule Take 40 mg by mouth every evening.      . fexofenadine (ALLEGRA) 180 MG tablet Take 180 mg by mouth daily as needed. For Allergies      . gabapentin (NEURONTIN) 100 MG capsule Take 100 mg by mouth at bedtime.       . hyoscyamine (LEVSIN, ANASPAZ) 0.125 MG tablet Take 0.125 mg by mouth every 4 (four) hours as needed. For cramping      . linagliptin (TRADJENTA) 5 MG TABS tablet Take 5 mg by mouth every morning.       . propranolol-hydrochlorothiazide (INDERIDE) 40-25 MG per tablet Take 1 tablet by mouth every morning.       . ramipril (ALTACE) 5 MG capsule Take 5 mg by mouth every morning.       . simvastatin (ZOCOR) 40 MG tablet Take 40 mg by mouth at bedtime.        Aspirin History reviewed. No pertinent family history. Social History:   reports that she has never smoked. She does not have any smokeless tobacco history on file. She reports that she does not drink alcohol or use illicit drugs.   REVIEW OF SYSTEMS - PERTINENT POSITIVES ONLY:   Physical Exam:   Blood pressure  146/80, pulse 72, temperature 96 F (35.6 C), temperature source Temporal, resp. rate 16, height 5' 5.5" (1.664 m), weight 200 lb 6.4 oz (90.901 kg). Body mass index is 32.84 kg/(m^2).  Gen:  WDWN AAF NAD  Neurological: Alert and oriented to person, place, and time. Motor and sensory function is grossly intact  Head: Normocephalic and atraumatic.  Eyes: Conjunctivae are normal. Pupils are equal, round, and reactive to light. No scleral icterus.  Neck: Normal range of motion. Neck supple. No tracheal deviation or thyromegaly present.  Cardiovascular:  SR without murmurs or gallops.  No carotid bruits Respiratory: Effort normal.  No respiratory distress. No chest wall tenderness. Breath sounds normal.  No wheezes, rales or rhonchi.  Abdomen:  Nontender.  Prior lap chole GU: Musculoskeletal: Normal range of motion. Extremities are nontender. No cyanosis, edema or  clubbing noted Lymphadenopathy: No cervical, preauricular, postauricular or axillary adenopathy is present Skin: Skin is warm and dry. No rash noted. No diaphoresis. No erythema. No pallor. Pscyh: Normal mood and affect. Behavior is normal. Judgment and thought content normal.   LABORATORY RESULTS: No results found for this or any previous visit (from the past 48 hour(s)).  RADIOLOGY RESULTS: No results found.  Problem List: Patient Active Problem List  Diagnoses  . Gout  . Diabetes mellitus  . Hypertension  . Hyperlipidemia    Assessment & Plan: Ileocecal cancer.  Plan lap assisted right hemicolectomy    Matt B. Daphine Deutscher, MD, [facility] [phone] beeper [phone]  12/08/2011 3:48 PM

## 2011-12-13 ENCOUNTER — Encounter (HOSPITAL_COMMUNITY): Payer: Self-pay | Admitting: Pharmacy Technician

## 2011-12-20 ENCOUNTER — Encounter (HOSPITAL_COMMUNITY): Payer: Self-pay

## 2011-12-20 ENCOUNTER — Ambulatory Visit (HOSPITAL_COMMUNITY)
Admission: RE | Admit: 2011-12-20 | Discharge: 2011-12-20 | Disposition: A | Discharge status: Home / Self Care | Payer: Medicare Other | Source: Ambulatory Visit | Attending: Surgery | Admitting: Surgery

## 2011-12-20 ENCOUNTER — Encounter (HOSPITAL_COMMUNITY)
Admission: RE | Admit: 2011-12-20 | Discharge: 2011-12-20 | Disposition: A | Discharge status: Home / Self Care | Payer: Medicare Other | Source: Ambulatory Visit | Attending: Surgery | Admitting: Surgery

## 2011-12-20 DIAGNOSIS — C801 Malignant (primary) neoplasm, unspecified: Secondary | ICD-10-CM | POA: Insufficient documentation

## 2011-12-20 DIAGNOSIS — Z01812 Encounter for preprocedural laboratory examination: Secondary | ICD-10-CM | POA: Diagnosis not present

## 2011-12-20 DIAGNOSIS — I10 Essential (primary) hypertension: Secondary | ICD-10-CM | POA: Diagnosis not present

## 2011-12-20 DIAGNOSIS — Z01811 Encounter for preprocedural respiratory examination: Secondary | ICD-10-CM | POA: Diagnosis not present

## 2011-12-20 DIAGNOSIS — C180 Malignant neoplasm of cecum: Secondary | ICD-10-CM | POA: Diagnosis not present

## 2011-12-20 HISTORY — DX: Unspecified asthma, uncomplicated: J45.909

## 2011-12-20 HISTORY — DX: Personal history of other diseases of the digestive system: Z87.19

## 2011-12-20 LAB — BASIC METABOLIC PANEL
BUN: 8 mg/dL (ref 6–23)
CO2: 30 mEq/L (ref 19–32)
Calcium: 9 mg/dL (ref 8.4–10.5)
Chloride: 100 mEq/L (ref 96–112)
Creatinine, Ser: 0.84 mg/dL (ref 0.50–1.10)
Glucose, Bld: 99 mg/dL (ref 70–99)

## 2011-12-20 LAB — CBC
HCT: 33.2 % — ABNORMAL LOW (ref 36.0–46.0)
Hemoglobin: 10.6 g/dL — ABNORMAL LOW (ref 12.0–15.0)
MCH: 23.4 pg — ABNORMAL LOW (ref 26.0–34.0)
MCV: 73.3 fL — ABNORMAL LOW (ref 78.0–100.0)
Platelets: 384 10*3/uL (ref 150–400)
RBC: 4.53 MIL/uL (ref 3.87–5.11)
WBC: 5.9 10*3/uL (ref 4.0–10.5)

## 2011-12-20 LAB — ABO/RH: ABO/RH(D): B POS

## 2011-12-20 LAB — TYPE AND SCREEN: Antibody Screen: NEGATIVE

## 2011-12-20 LAB — SURGICAL PCR SCREEN: MRSA, PCR: NEGATIVE

## 2011-12-20 NOTE — Pre-Procedure Instructions (Signed)
Notified Barbara at [facility] of abnormal potassium for MD to review-

## 2011-12-20 NOTE — Patient Instructions (Addendum)
[address] Peggy French  12/20/2011   Your procedure is scheduled on:  12/22/11  Wednesday  Surgery [id number]  Report to [facility] at   1030    AM.  Call this number if you have problems the morning of surgery: [phone]     Or PST   [id number]  Ashley Akin INHALERS WITH YOU TO HOSPITAL  Remember:            DO NOT TAKE ANY BLOOD SUGAR MEDICINE MORNING OF SURGERY  Do not eat food HAS BEEN ON LIQUIDS SINCE SUNDAY  May have clear liquids: ALL DAY Tuesday AS PER OFFICE -   BOWEL PREP AS PER OFFICE-       INCREASE FLUIDS Tuesday  - DRINK FLUIDS UNTIL MIDNIGHT OR BEDTIME Tuesday  NIGHT   THEN NONE               CALL OFFICE REGARDING IF USES FLEETS ENEMA OR NOT- STATES WASN'T INSTRUCTED  Clear liquids include soda, tea, black coffee, apple or grape juice, broth.  Take these medicines the morning of surgery with A SIP OF WATER:AMLODOPINE WITH SIP WATER    ALLEGRA IF NEEDED  ALBUTEROL IF NEEDED   Do not wear jewelry, make-up or nail polish.  Do not wear lotions, powders, or perfumes. You may wear deodorant.  Do not shave 48 hours prior to surgery.  Do not bring valuables to the hospital.  Contacts, dentures or bridgework may not be worn into surgery.  Leave suitcase in the car. After surgery it may be brought to your room.  For patients admitted to the hospital, checkout time is 11:00 AM the day of discharge.   Patients discharged the day of surgery will not be allowed to drive home.  Name and phone number of your driver:    husband                                                                  Special Instructions: CHG Shower Use Special Wash: 1/2 bottle night before surgery and 1/2 bottle morning of surgery. REGULAR SOAP FACE AND PRIVATES              LADIES- NO SHAVING 48 HOURS BEFORE USING BETASEPT SOAP.                Please read over the following fact sheets that you were given: MRSA Information

## 2011-12-21 ENCOUNTER — Telehealth (INDEPENDENT_AMBULATORY_CARE_PROVIDER_SITE_OTHER): Payer: Self-pay

## 2011-12-21 ENCOUNTER — Telehealth (INDEPENDENT_AMBULATORY_CARE_PROVIDER_SITE_OTHER): Payer: Self-pay | Admitting: General Surgery

## 2011-12-21 NOTE — Telephone Encounter (Signed)
Pt states she had completed prep but has noticed a slight amount of blood in toilet. Pt states she has a tumor and this is the reason for surgery. I advised pt a small amount of bleeding is no uncommon with the stress of the bowel prep. Pt advised if bleeding continues once she has expelled all of the prep or if the amount of bleeding increases to call our office asap. Pt states she understands.

## 2011-12-21 NOTE — H&P (Signed)
Chief Complaint: Right colon cancer  History of Present Illness: Peggy French is an 70 y.o. 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  Impression:  Normal terminal ileum.  Large friable mass and ileocecal valve to for histology.  Two small polyps ablated via cold biopsy from splenic flexure.  Mild sigmoid colon diverticulosis.  External hemorrhoids.  She describes a transrectal tumor resected by surgeons in [place] some years ago. She is aware of the procedure and surgery booklets were given to her.  Past Medical History   Diagnosis  Date   .  Gout    .  Diabetes mellitus      x over 10 yrs   .  Hypertension    .  Hyperlipidemia    .  GERD (gastroesophageal reflux disease)    .  Chest pain     Past Surgical History   Procedure  Date   .  Cholecystectomy    .  Abdominal hysterectomy    .  Colon surgery      Tumor   .  Esophagogastroduodenoscopy  06/16/2011     Procedure: ESOPHAGOGASTRODUODENOSCOPY (EGD); Surgeon: Najeeb U Rehman, MD; [facility]; Service: Endoscopy; Laterality: N/A; 2:15   .  Colonoscopy  11/26/2011     Procedure: COLONOSCOPY; Surgeon: Najeeb U Rehman, MD; [facility]; Service: Endoscopy; Laterality: N/A; 2:15    Current Outpatient Prescriptions   Medication  Sig  Dispense  Refill   .  acetaminophen (TYLENOL) 500 MG tablet  Take 1,000 mg by mouth every 6 (six) hours as needed. For pain     .  albuterol (PROVENTIL HFA;VENTOLIN HFA) 108 (90 BASE) MCG/ACT inhaler  Inhale 2 puffs into the lungs every 6 (six) hours as needed. For shortness of breath     .  allopurinol (ZYLOPRIM) 100 MG tablet  Take 100 mg by mouth every morning.     .  amLODipine (NORVASC) 5 MG tablet  Take 5 mg by mouth every morning.     .  colchicine 0.6 MG tablet  Take 0.6 mg by mouth daily as needed. For severe gout flares     .  diphenoxylate-atropine (LOMOTIL) 2.5-0.025 MG per tablet  Take 1 tablet by mouth every 6 (six)  hours as needed. For diarrhea     .  esomeprazole (NEXIUM) 40 MG capsule  Take 40 mg by mouth every evening.     .  fexofenadine (ALLEGRA) 180 MG tablet  Take 180 mg by mouth daily as needed. For Allergies     .  gabapentin (NEURONTIN) 100 MG capsule  Take 100 mg by mouth at bedtime.     .  hyoscyamine (LEVSIN, ANASPAZ) 0.125 MG tablet  Take 0.125 mg by mouth every 4 (four) hours as needed. For cramping     .  linagliptin (TRADJENTA) 5 MG TABS tablet  Take 5 mg by mouth every morning.     .  propranolol-hydrochlorothiazide (INDERIDE) 40-25 MG per tablet  Take 1 tablet by mouth every morning.     .  ramipril (ALTACE) 5 MG capsule  Take 5 mg by mouth every morning.     .  simvastatin (ZOCOR) 40 MG tablet  Take 40 mg by mouth at bedtime.      Aspirin  History reviewed. No pertinent family history.  Social History: reports that she has never smoked. She does not have any smokeless tobacco history on file. She reports that   she does not drink alcohol or use illicit drugs.  REVIEW OF SYSTEMS - PERTINENT POSITIVES ONLY:  Physical Exam:  Blood pressure 146/80, pulse 72, temperature 96 F (35.6 C), temperature source Temporal, resp. rate 16, height 5' 5.5" (1.664 m), weight 200 lb 6.4 oz (90.901 kg).  Body mass index is 32.84 kg/(m^2).  Gen: WDWN AAF NAD  Neurological: Alert and oriented to person, place, and time. Motor and sensory function is grossly intact  Head: Normocephalic and atraumatic.  Eyes: Conjunctivae are normal. Pupils are equal, round, and reactive to light. No scleral icterus.  Neck: Normal range of motion. Neck supple. No tracheal deviation or thyromegaly present.  Cardiovascular: SR without murmurs or gallops. No carotid bruits  Respiratory: Effort normal. No respiratory distress. No chest wall tenderness. Breath sounds normal. No wheezes, rales or rhonchi.  Abdomen: Nontender. Prior lap chole  GU:  Musculoskeletal: Normal range of motion. Extremities are nontender. No cyanosis,  edema or clubbing noted Lymphadenopathy: No cervical, preauricular, postauricular or axillary adenopathy is present Skin: Skin is warm and dry. No rash noted. No diaphoresis. No erythema. No pallor. Pscyh: Normal mood and affect. Behavior is normal. Judgment and thought content normal.  LABORATORY RESULTS:  No results found for this or any previous visit (from the past 48 hour(s)).  RADIOLOGY RESULTS:  No results found.  Problem List:  Patient Active Problem List   Diagnoses   .  Gout   .  Diabetes mellitus   .  Hypertension   .  Hyperlipidemia    Assessment & Plan:  Ileocecal cancer. Plan lap assisted right hemicolectomy  Matt B. Alvine Mostafa, MD, FACS  [facility]  [phone] beeper  [phone]      

## 2011-12-21 NOTE — Telephone Encounter (Signed)
Pt calling in to report vomiting since taking antibiotics.  Cannot hold down anything, even ice chips or sips.  Pt instructed to be strict NPO for next 2 hours, then try sips again.  If vomits again, return to NPO.  OK to stop antibx if too nauseated to take them.

## 2011-12-22 ENCOUNTER — Encounter (HOSPITAL_COMMUNITY): Payer: Self-pay | Admitting: *Deleted

## 2011-12-22 ENCOUNTER — Ambulatory Visit (HOSPITAL_COMMUNITY)
Admission: RE | Admit: 2011-12-22 | Discharge: 2011-12-22 | Disposition: A | Discharge status: Home / Self Care | Payer: Medicare Other | Source: Ambulatory Visit | Attending: Surgery | Admitting: Surgery

## 2011-12-22 ENCOUNTER — Encounter (HOSPITAL_COMMUNITY)
Admission: RE | Disposition: A | Discharge status: Home / Self Care | Payer: Self-pay | Source: Ambulatory Visit | Attending: Surgery

## 2011-12-22 DIAGNOSIS — C189 Malignant neoplasm of colon, unspecified: Secondary | ICD-10-CM | POA: Diagnosis not present

## 2011-12-22 DIAGNOSIS — Z538 Procedure and treatment not carried out for other reasons: Secondary | ICD-10-CM | POA: Insufficient documentation

## 2011-12-22 DIAGNOSIS — Z01812 Encounter for preprocedural laboratory examination: Secondary | ICD-10-CM | POA: Diagnosis not present

## 2011-12-22 LAB — GLUCOSE, CAPILLARY: Glucose-Capillary: 147 mg/dL — ABNORMAL HIGH (ref 70–99)

## 2011-12-22 SURGERY — LAPAROSCOPIC PARTIAL COLECTOMY
Anesthesia: General

## 2011-12-22 MED ORDER — SODIUM CHLORIDE 0.9 % IV SOLN: 1.0000 g | INTRAVENOUS | Status: DC

## 2011-12-22 MED ORDER — HEPARIN SODIUM (PORCINE) 5000 UNIT/ML IJ SOLN
5000.0000 [IU] | Freq: Once | INTRAMUSCULAR | Status: AC
  Administered 2011-12-22: 5000 [IU] via SUBCUTANEOUS

## 2011-12-22 MED ORDER — PROPRANOLOL HCL 40 MG PO TABS
40.0000 mg | ORAL_TABLET | Freq: Once | ORAL | Status: DC
  Filled 2011-12-22: qty 1

## 2011-12-22 MED ORDER — HEPARIN SODIUM (PORCINE) 5000 UNIT/ML IJ SOLN
INTRAMUSCULAR | Status: AC
  Filled 2011-12-22: qty 1

## 2011-12-22 SURGICAL SUPPLY — 67 items
APPLIER CLIP 5 13 M/L LIGAMAX5 (MISCELLANEOUS)
APPLIER CLIP ROT 10 11.4 M/L (STAPLE)
APR CLP MED LRG 11.4X10 (STAPLE)
APR CLP MED LRG 5 ANG JAW (MISCELLANEOUS)
BLADE EXTENDED COATED 6.5IN (ELECTRODE) IMPLANT
BLADE HEX COATED 2.75 (ELECTRODE) IMPLANT
BLADE SURG SZ10 CARB STEEL (BLADE) IMPLANT
CABLE HIGH FREQUENCY MONO STRZ (ELECTRODE) IMPLANT
CANISTER SUCTION 2500CC (MISCELLANEOUS) IMPLANT
CELLS DAT CNTRL 66122 CELL SVR (MISCELLANEOUS) IMPLANT
CLIP APPLIE 5 13 M/L LIGAMAX5 (MISCELLANEOUS) IMPLANT
CLIP APPLIE ROT 10 11.4 M/L (STAPLE) IMPLANT
CLOTH BEACON ORANGE TIMEOUT ST (SAFETY) IMPLANT
COVER MAYO STAND STRL (DRAPES) IMPLANT
DECANTER SPIKE VIAL GLASS SM (MISCELLANEOUS) IMPLANT
DRAIN CHANNEL 19F RND (DRAIN) IMPLANT
DRAPE LAPAROSCOPIC ABDOMINAL (DRAPES) IMPLANT
DRAPE LG THREE QUARTER DISP (DRAPES) IMPLANT
DRAPE WARM FLUID 44X44 (DRAPE) IMPLANT
ELECT REM PT RETURN 9FT ADLT (ELECTROSURGICAL)
ELECTRODE REM PT RTRN 9FT ADLT (ELECTROSURGICAL) IMPLANT
GLOVE BIOGEL M 8.0 STRL (GLOVE) IMPLANT
GLOVE BIOGEL PI IND STRL 7.0 (GLOVE) IMPLANT
GLOVE BIOGEL PI INDICATOR 7.0 (GLOVE)
GOWN STRL NON-REIN LRG LVL3 (GOWN DISPOSABLE) IMPLANT
GOWN STRL REIN XL XLG (GOWN DISPOSABLE) IMPLANT
GRASPER LAPSCPC 5X35 EPIX (ENDOMECHANICALS) IMPLANT
HAND ACTIVATED (MISCELLANEOUS) IMPLANT
KIT BASIN OR (CUSTOM PROCEDURE TRAY) IMPLANT
LEGGING LITHOTOMY PAIR STRL (DRAPES) IMPLANT
LIGASURE IMPACT 36 18CM CVD LR (INSTRUMENTS) IMPLANT
NS IRRIG 1000ML POUR BTL (IV SOLUTION) IMPLANT
PENCIL BUTTON HOLSTER BLD 10FT (ELECTRODE) IMPLANT
RTRCTR WOUND ALEXIS 18CM MED (MISCELLANEOUS)
SCISSORS LAP 5X35 DISP (ENDOMECHANICALS) IMPLANT
SEALER TISSUE G2 CVD JAW 35 (ENDOMECHANICALS) IMPLANT
SEALER TISSUE G2 CVD JAW 45CM (ENDOMECHANICALS)
SET IRRIG TUBING LAPAROSCOPIC (IRRIGATION / IRRIGATOR) IMPLANT
SOLUTION ANTI FOG 6CC (MISCELLANEOUS) IMPLANT
SPONGE GAUZE 4X4 12PLY (GAUZE/BANDAGES/DRESSINGS) IMPLANT
SPONGE LAP 18X18 X RAY DECT (DISPOSABLE) IMPLANT
STAPLER VISISTAT 35W (STAPLE) IMPLANT
SUCTION POOLE TIP (SUCTIONS) IMPLANT
SUT PDS AB 1 CTX 36 (SUTURE) IMPLANT
SUT PDS AB 1 TP1 96 (SUTURE) IMPLANT
SUT PROLENE 2 0 KS (SUTURE) IMPLANT
SUT SILK 2 0 (SUTURE)
SUT SILK 2 0 SH CR/8 (SUTURE) IMPLANT
SUT SILK 2-0 18XBRD TIE 12 (SUTURE) IMPLANT
SUT SILK 3 0 (SUTURE)
SUT SILK 3 0 SH CR/8 (SUTURE) IMPLANT
SUT SILK 3-0 18XBRD TIE 12 (SUTURE) IMPLANT
SUT VIC AB 2-0 SH 18 (SUTURE) IMPLANT
SUT VICRYL 2 0 18  UND BR (SUTURE)
SUT VICRYL 2 0 18 UND BR (SUTURE) IMPLANT
SYR 30ML LL (SYRINGE) IMPLANT
SYS LAPSCP GELPORT 120MM (MISCELLANEOUS)
SYSTEM LAPSCP GELPORT 120MM (MISCELLANEOUS) IMPLANT
TOWEL OR 17X26 10 PK STRL BLUE (TOWEL DISPOSABLE) IMPLANT
TRAY FOLEY CATH 14FRSI W/METER (CATHETERS) IMPLANT
TRAY LAP CHOLE (CUSTOM PROCEDURE TRAY) IMPLANT
TROCAR XCEL BLUNT TIP 100MML (ENDOMECHANICALS) IMPLANT
TROCAR XCEL NON-BLD 11X100MML (ENDOMECHANICALS) IMPLANT
TROCAR XCEL NON-BLD 5MMX100MML (ENDOMECHANICALS) IMPLANT
TUBING FILTER THERMOFLATOR (ELECTROSURGICAL) IMPLANT
YANKAUER SUCT BULB TIP 10FT TU (MISCELLANEOUS) IMPLANT
YANKAUER SUCT BULB TIP NO VENT (SUCTIONS) IMPLANT

## 2011-12-22 NOTE — Progress Notes (Signed)
Surgery cancelled by dr Daphine Deutscher.  Sandy caudel rn from or came over and explained to patient  pt discharged to home

## 2011-12-22 NOTE — Anesthesia Preprocedure Evaluation (Addendum)
Anesthesia Evaluation  Patient identified by MRN, date of birth, ID band Patient awake    Reviewed: Allergy & Precautions, H&P , NPO status , Patient's Chart, lab work & pertinent test results  Airway Mallampati: II TM Distance: >3 FB Neck ROM: Full    Dental  (+) Edentulous Upper and Missing,    Pulmonary asthma , sleep apnea ,  breath sounds clear to auscultation  Pulmonary exam normal       Cardiovascular hypertension, Pt. on medications and Pt. on home beta blockers Rhythm:Regular Rate:Normal     Neuro/Psych  Headaches, negative psych ROS   GI/Hepatic Neg liver ROS, hiatal hernia, GERD-  ,  Endo/Other  Diabetes mellitus-, Type 2, Oral Hypoglycemic AgentsMorbid obesity  Renal/GU negative Renal ROS  negative genitourinary   Musculoskeletal negative musculoskeletal ROS (+)   Abdominal   Peds  Hematology negative hematology ROS (+) Anemia, Hb 10.2   Anesthesia Other Findings   Reproductive/Obstetrics negative OB ROS                         Anesthesia Physical Anesthesia Plan  ASA: III  Anesthesia Plan: General   Post-op Pain Management:    Induction: Intravenous  Airway Management Planned: Oral ETT  Additional Equipment:   Intra-op Plan:   Post-operative Plan: Extubation in OR  Informed Consent: I have reviewed the patients History and Physical, chart, labs and discussed the procedure including the risks, benefits and alternatives for the proposed anesthesia with the patient or authorized representative who has indicated his/her understanding and acceptance.   Dental advisory given  Plan Discussed with: CRNA  Anesthesia Plan Comments:         Anesthesia Quick Evaluation

## 2011-12-23 ENCOUNTER — Encounter (HOSPITAL_COMMUNITY): Payer: Self-pay | Admitting: *Deleted

## 2011-12-24 ENCOUNTER — Telehealth (INDEPENDENT_AMBULATORY_CARE_PROVIDER_SITE_OTHER): Payer: Self-pay | Admitting: General Surgery

## 2011-12-24 NOTE — Telephone Encounter (Signed)
Called pt to report she will not need to take the antibiotic with her bowel prep this time.  Called [facility]:  [phone], to reorder the Golytely.

## 2011-12-28 ENCOUNTER — Other Ambulatory Visit (INDEPENDENT_AMBULATORY_CARE_PROVIDER_SITE_OTHER): Payer: Self-pay | Admitting: Surgery

## 2011-12-28 MED ORDER — DEXTROSE 5 % IV SOLN
2.0000 g | INTRAVENOUS | Status: AC
  Administered 2011-12-29: 2 g via INTRAVENOUS
  Filled 2011-12-28: qty 2

## 2011-12-29 ENCOUNTER — Encounter (HOSPITAL_COMMUNITY): Payer: Self-pay | Admitting: *Deleted

## 2011-12-29 ENCOUNTER — Ambulatory Visit (HOSPITAL_COMMUNITY): Payer: Medicare Other | Admitting: *Deleted

## 2011-12-29 ENCOUNTER — Inpatient Hospital Stay (HOSPITAL_COMMUNITY)
Admission: RE | Admit: 2011-12-29 | Discharge: 2012-01-04 | DRG: 330 | Disposition: A | Discharge status: Home / Self Care | Payer: Medicare Other | Source: Ambulatory Visit | Attending: Surgery | Admitting: Surgery

## 2011-12-29 ENCOUNTER — Encounter (HOSPITAL_COMMUNITY)
Admission: RE | Disposition: A | Discharge status: Home / Self Care | Payer: Self-pay | Source: Ambulatory Visit | Attending: Surgery

## 2011-12-29 DIAGNOSIS — C189 Malignant neoplasm of colon, unspecified: Secondary | ICD-10-CM

## 2011-12-29 DIAGNOSIS — C772 Secondary and unspecified malignant neoplasm of intra-abdominal lymph nodes: Secondary | ICD-10-CM | POA: Diagnosis not present

## 2011-12-29 DIAGNOSIS — C182 Malignant neoplasm of ascending colon: Secondary | ICD-10-CM | POA: Diagnosis not present

## 2011-12-29 DIAGNOSIS — I10 Essential (primary) hypertension: Secondary | ICD-10-CM | POA: Diagnosis not present

## 2011-12-29 DIAGNOSIS — K560 Paralytic ileus: Secondary | ICD-10-CM | POA: Diagnosis not present

## 2011-12-29 DIAGNOSIS — K219 Gastro-esophageal reflux disease without esophagitis: Secondary | ICD-10-CM | POA: Diagnosis present

## 2011-12-29 DIAGNOSIS — K449 Diaphragmatic hernia without obstruction or gangrene: Secondary | ICD-10-CM | POA: Diagnosis not present

## 2011-12-29 DIAGNOSIS — G4730 Sleep apnea, unspecified: Secondary | ICD-10-CM | POA: Diagnosis present

## 2011-12-29 DIAGNOSIS — C180 Malignant neoplasm of cecum: Secondary | ICD-10-CM | POA: Diagnosis not present

## 2011-12-29 DIAGNOSIS — E119 Type 2 diabetes mellitus without complications: Secondary | ICD-10-CM | POA: Diagnosis present

## 2011-12-29 LAB — BASIC METABOLIC PANEL
Calcium: 9.3 mg/dL (ref 8.4–10.5)
Chloride: 97 mEq/L (ref 96–112)
Creatinine, Ser: 0.81 mg/dL (ref 0.50–1.10)
GFR calc Af Amer: 84 mL/min — ABNORMAL LOW (ref >90)
GFR calc non Af Amer: 72 mL/min — ABNORMAL LOW (ref >90)

## 2011-12-29 LAB — GLUCOSE, CAPILLARY
Glucose-Capillary: 144 mg/dL — ABNORMAL HIGH (ref 70–99)
Glucose-Capillary: 138 mg/dL — ABNORMAL HIGH (ref 70–99)
Glucose-Capillary: 131 mg/dL — ABNORMAL HIGH (ref 70–99)

## 2011-12-29 LAB — CBC
MCHC: 32.2 g/dL (ref 30.0–36.0)
MCV: 72.5 fL — ABNORMAL LOW (ref 78.0–100.0)
Platelets: 380 10*3/uL (ref 150–400)
RDW: 14.2 % (ref 11.5–15.5)
WBC: 5.7 10*3/uL (ref 4.0–10.5)

## 2011-12-29 LAB — TYPE AND SCREEN: ABO/RH(D): B POS

## 2011-12-29 SURGERY — LAPAROSCOPIC RIGHT HEMI COLECTOMY
Anesthesia: General | Site: Abdomen | Wound class: Clean Contaminated

## 2011-12-29 MED ORDER — SUCCINYLCHOLINE CHLORIDE 20 MG/ML IJ SOLN: INTRAMUSCULAR | Status: DC | PRN

## 2011-12-29 MED ORDER — SODIUM CHLORIDE 0.9 % IJ SOLN
INTRAMUSCULAR | Status: DC | PRN
  Administered 2011-12-29: 20 mL

## 2011-12-29 MED ORDER — ONDANSETRON HCL 4 MG/2ML IJ SOLN
INTRAMUSCULAR | Status: DC | PRN
  Administered 2011-12-29 (×2): 4 mg via INTRAVENOUS

## 2011-12-29 MED ORDER — ALVIMOPAN 12 MG PO CAPS
ORAL_CAPSULE | ORAL | Status: AC
  Filled 2011-12-29: qty 1

## 2011-12-29 MED ORDER — HYDROMORPHONE HCL PF 1 MG/ML IJ SOLN
0.5000 mg | INTRAMUSCULAR | Status: DC | PRN
  Administered 2011-12-29: 16:00:00 via INTRAVENOUS
  Administered 2011-12-29 – 2011-12-30 (×4): 0.5 mg via INTRAVENOUS
  Filled 2011-12-29 (×4): qty 1

## 2011-12-29 MED ORDER — PROPRANOLOL-HCTZ 40-25 MG PO TABS: 1.0000 | ORAL_TABLET | Freq: Every morning | ORAL | Status: DC

## 2011-12-29 MED ORDER — PROPOFOL 10 MG/ML IV BOLUS
INTRAVENOUS | Status: DC | PRN
  Administered 2011-12-29: 70 mg via INTRAVENOUS

## 2011-12-29 MED ORDER — BUPIVACAINE LIPOSOME 1.3 % IJ SUSP
20.0000 mL | INTRAMUSCULAR | Status: AC
  Administered 2011-12-29: 20 mL
  Filled 2011-12-29: qty 20

## 2011-12-29 MED ORDER — HEPARIN SODIUM (PORCINE) 5000 UNIT/ML IJ SOLN
5000.0000 [IU] | Freq: Once | INTRAMUSCULAR | Status: AC
  Administered 2011-12-29: 5000 [IU] via SUBCUTANEOUS

## 2011-12-29 MED ORDER — PROPRANOLOL HCL ER 80 MG PO CP24
80.0000 mg | ORAL_CAPSULE | Freq: Every day | ORAL | Status: DC
  Administered 2011-12-30 – 2012-01-04 (×5): 80 mg via ORAL
  Filled 2011-12-29 (×7): qty 1

## 2011-12-29 MED ORDER — ACETAMINOPHEN 10 MG/ML IV SOLN
INTRAVENOUS | Status: DC | PRN
  Administered 2011-12-29: 1000 mg via INTRAVENOUS

## 2011-12-29 MED ORDER — ALBUTEROL SULFATE HFA 108 (90 BASE) MCG/ACT IN AERS
2.0000 | INHALATION_SPRAY | Freq: Four times a day (QID) | RESPIRATORY_TRACT | Status: DC | PRN
  Filled 2011-12-29: qty 6.7

## 2011-12-29 MED ORDER — CEFOXITIN SODIUM-DEXTROSE 1-4 GM-% IV SOLR (PREMIX)
INTRAVENOUS | Status: AC
  Filled 2011-12-29: qty 100

## 2011-12-29 MED ORDER — NEOSTIGMINE METHYLSULFATE 1 MG/ML IJ SOLN
INTRAMUSCULAR | Status: DC | PRN
  Administered 2011-12-29: 4 mg via INTRAVENOUS

## 2011-12-29 MED ORDER — LACTATED RINGERS IV SOLN: INTRAVENOUS | Status: DC

## 2011-12-29 MED ORDER — PROMETHAZINE HCL 25 MG/ML IJ SOLN
INTRAMUSCULAR | Status: AC
  Filled 2011-12-29: qty 1

## 2011-12-29 MED ORDER — ETOMIDATE 2 MG/ML IV SOLN
INTRAVENOUS | Status: DC | PRN
  Administered 2011-12-29: 7 mg via INTRAVENOUS

## 2011-12-29 MED ORDER — HEPARIN SODIUM (PORCINE) 5000 UNIT/ML IJ SOLN
INTRAMUSCULAR | Status: AC
  Filled 2011-12-29: qty 1

## 2011-12-29 MED ORDER — ACETAMINOPHEN 10 MG/ML IV SOLN
INTRAVENOUS | Status: AC
  Filled 2011-12-29: qty 100

## 2011-12-29 MED ORDER — HYDROMORPHONE HCL PF 1 MG/ML IJ SOLN
0.2500 mg | INTRAMUSCULAR | Status: DC | PRN
  Administered 2011-12-29 (×3): 0.25 mg via INTRAVENOUS

## 2011-12-29 MED ORDER — ALVIMOPAN 12 MG PO CAPS
12.0000 mg | ORAL_CAPSULE | Freq: Once | ORAL | Status: AC
  Administered 2011-12-29: 12 mg via ORAL

## 2011-12-29 MED ORDER — LIDOCAINE HCL (CARDIAC) 20 MG/ML IV SOLN
INTRAVENOUS | Status: DC | PRN
  Administered 2011-12-29: 80 mg via INTRAVENOUS

## 2011-12-29 MED ORDER — HEPARIN SODIUM (PORCINE) 5000 UNIT/ML IJ SOLN
5000.0000 [IU] | Freq: Three times a day (TID) | INTRAMUSCULAR | Status: DC
  Administered 2011-12-29 – 2012-01-04 (×17): 5000 [IU] via SUBCUTANEOUS
  Filled 2011-12-29 (×20): qty 1

## 2011-12-29 MED ORDER — HYDROCHLOROTHIAZIDE 25 MG PO TABS
25.0000 mg | ORAL_TABLET | Freq: Every day | ORAL | Status: DC
  Administered 2011-12-30 – 2012-01-04 (×5): 25 mg via ORAL
  Filled 2011-12-29 (×7): qty 1

## 2011-12-29 MED ORDER — LACTATED RINGERS IV SOLN
INTRAVENOUS | Status: DC
  Administered 2011-12-29: 1000 mL via INTRAVENOUS

## 2011-12-29 MED ORDER — KCL IN DEXTROSE-NACL 20-5-0.45 MEQ/L-%-% IV SOLN
INTRAVENOUS | Status: DC
  Administered 2011-12-30 – 2012-01-01 (×6): via INTRAVENOUS
  Administered 2012-01-01: 75 mL/h via INTRAVENOUS
  Administered 2012-01-02: 04:00:00 via INTRAVENOUS
  Administered 2012-01-02: 75 mL/h via INTRAVENOUS
  Administered 2012-01-03 – 2012-01-04 (×3): via INTRAVENOUS
  Filled 2011-12-29 (×14): qty 1000

## 2011-12-29 MED ORDER — PROPRANOLOL HCL 40 MG PO TABS
40.0000 mg | ORAL_TABLET | Freq: Once | ORAL | Status: AC
  Administered 2011-12-29: 40 mg via ORAL
  Filled 2011-12-29: qty 1

## 2011-12-29 MED ORDER — HYDROMORPHONE HCL PF 1 MG/ML IJ SOLN
INTRAMUSCULAR | Status: AC
  Filled 2011-12-29: qty 1

## 2011-12-29 MED ORDER — LACTATED RINGERS IV SOLN
INTRAVENOUS | Status: DC | PRN
  Administered 2011-12-29 (×3): via INTRAVENOUS

## 2011-12-29 MED ORDER — COLCHICINE 0.6 MG PO TABS: 0.6000 mg | ORAL_TABLET | Freq: Every day | ORAL | Status: DC | PRN

## 2011-12-29 MED ORDER — PROMETHAZINE HCL 25 MG/ML IJ SOLN
6.2500 mg | INTRAMUSCULAR | Status: DC | PRN
  Administered 2011-12-29: 6.25 mg via INTRAVENOUS

## 2011-12-29 MED ORDER — POTASSIUM CHLORIDE 10 MEQ/100ML IV SOLN: INTRAVENOUS | Status: DC | PRN

## 2011-12-29 MED ORDER — ALLOPURINOL 100 MG PO TABS: 100.0000 mg | ORAL_TABLET | Freq: Every morning | ORAL | Status: DC

## 2011-12-29 MED ORDER — ONDANSETRON HCL 4 MG/2ML IJ SOLN
INTRAMUSCULAR | Status: AC
  Filled 2011-12-29: qty 2

## 2011-12-29 MED ORDER — POTASSIUM CHLORIDE 10 MEQ/100ML IV SOLN
INTRAVENOUS | Status: DC | PRN
  Administered 2011-12-29 (×2): 10 meq via INTRAVENOUS

## 2011-12-29 MED ORDER — CISATRACURIUM BESYLATE (PF) 10 MG/5ML IV SOLN
INTRAVENOUS | Status: DC | PRN
  Administered 2011-12-29: 4 mg via INTRAVENOUS
  Administered 2011-12-29: 12 mg via INTRAVENOUS
  Administered 2011-12-29: 4 mg via INTRAVENOUS
  Administered 2011-12-29: 2 mg via INTRAVENOUS

## 2011-12-29 MED ORDER — FENTANYL CITRATE 0.05 MG/ML IJ SOLN
INTRAMUSCULAR | Status: DC | PRN
  Administered 2011-12-29 (×2): 50 ug via INTRAVENOUS
  Administered 2011-12-29: 25 ug via INTRAVENOUS
  Administered 2011-12-29 (×3): 50 ug via INTRAVENOUS

## 2011-12-29 MED ORDER — GLYCOPYRROLATE 0.2 MG/ML IJ SOLN
INTRAMUSCULAR | Status: DC | PRN
  Administered 2011-12-29: 0.6 mg via INTRAVENOUS

## 2011-12-29 MED ORDER — POTASSIUM CHLORIDE 10 MEQ/100ML IV SOLN
10.0000 meq | INTRAVENOUS | Status: DC
  Filled 2011-12-29 (×2): qty 100

## 2011-12-29 SURGICAL SUPPLY — 73 items
APPLIER CLIP 5 13 M/L LIGAMAX5 (MISCELLANEOUS)
APPLIER CLIP ROT 10 11.4 M/L (STAPLE) ×3
APR CLP MED LRG 11.4X10 (STAPLE) ×2
APR CLP MED LRG 5 ANG JAW (MISCELLANEOUS)
BLADE EXTENDED COATED 6.5IN (ELECTRODE) ×3 IMPLANT
BLADE HEX COATED 2.75 (ELECTRODE) ×3 IMPLANT
BLADE SURG SZ10 CARB STEEL (BLADE) ×3 IMPLANT
CABLE HIGH FREQUENCY MONO STRZ (ELECTRODE) ×3 IMPLANT
CANISTER SUCTION 2500CC (MISCELLANEOUS) ×3 IMPLANT
CELLS DAT CNTRL 66122 CELL SVR (MISCELLANEOUS) ×2 IMPLANT
CLIP APPLIE 5 13 M/L LIGAMAX5 (MISCELLANEOUS) IMPLANT
CLIP APPLIE ROT 10 11.4 M/L (STAPLE) ×2 IMPLANT
CLOTH BEACON ORANGE TIMEOUT ST (SAFETY) ×3 IMPLANT
COVER MAYO STAND STRL (DRAPES) ×3 IMPLANT
DECANTER SPIKE VIAL GLASS SM (MISCELLANEOUS) ×3 IMPLANT
DRAIN CHANNEL 19F RND (DRAIN) IMPLANT
DRAPE LAPAROSCOPIC ABDOMINAL (DRAPES) ×3 IMPLANT
DRAPE LG THREE QUARTER DISP (DRAPES) ×3 IMPLANT
DRAPE WARM FLUID 44X44 (DRAPE) ×3 IMPLANT
DRSG PAD ABDOMINAL 8X10 ST (GAUZE/BANDAGES/DRESSINGS) ×3 IMPLANT
ELECT REM PT RETURN 9FT ADLT (ELECTROSURGICAL) ×3
ELECTRODE REM PT RTRN 9FT ADLT (ELECTROSURGICAL) ×2 IMPLANT
GLOVE BIOGEL M 8.0 STRL (GLOVE) ×6 IMPLANT
GLOVE BIOGEL PI IND STRL 7.0 (GLOVE) ×4 IMPLANT
GLOVE BIOGEL PI INDICATOR 7.0 (GLOVE) ×2
GOWN STRL NON-REIN LRG LVL3 (GOWN DISPOSABLE) ×6 IMPLANT
GOWN STRL REIN XL XLG (GOWN DISPOSABLE) ×6 IMPLANT
GRASPER LAPSCPC 5X35 EPIX (ENDOMECHANICALS) IMPLANT
HAND ACTIVATED (MISCELLANEOUS) ×3 IMPLANT
KIT BASIN OR (CUSTOM PROCEDURE TRAY) ×3 IMPLANT
LEGGING LITHOTOMY PAIR STRL (DRAPES) IMPLANT
LIGASURE IMPACT 36 18CM CVD LR (INSTRUMENTS) ×3 IMPLANT
NS IRRIG 1000ML POUR BTL (IV SOLUTION) ×6 IMPLANT
PENCIL BUTTON HOLSTER BLD 10FT (ELECTRODE) ×3 IMPLANT
RELOAD PROXIMATE 75MM BLUE (ENDOMECHANICALS) ×6 IMPLANT
RTRCTR WOUND ALEXIS 18CM MED (MISCELLANEOUS) ×3
SCISSORS LAP 5X35 DISP (ENDOMECHANICALS) ×3 IMPLANT
SEALER TISSUE G2 CVD JAW 35 (ENDOMECHANICALS) IMPLANT
SEALER TISSUE G2 CVD JAW 45CM (ENDOMECHANICALS)
SET IRRIG TUBING LAPAROSCOPIC (IRRIGATION / IRRIGATOR) ×3 IMPLANT
SOLUTION ANTI FOG 6CC (MISCELLANEOUS) ×3 IMPLANT
SPONGE GAUZE 4X4 12PLY (GAUZE/BANDAGES/DRESSINGS) ×3 IMPLANT
SPONGE LAP 18X18 X RAY DECT (DISPOSABLE) ×6 IMPLANT
STAPLER PROXIMATE 75MM BLUE (STAPLE) ×3 IMPLANT
STAPLER VISISTAT 35W (STAPLE) ×3 IMPLANT
SUCTION POOLE TIP (SUCTIONS) ×3 IMPLANT
SUT NOVA 1 T20/GS 25DT (SUTURE) ×6 IMPLANT
SUT PDS AB 1 CTX 36 (SUTURE) ×6 IMPLANT
SUT PDS AB 1 TP1 96 (SUTURE) IMPLANT
SUT PDS AB 4-0 SH 27 (SUTURE) ×6 IMPLANT
SUT PROLENE 2 0 KS (SUTURE) IMPLANT
SUT SILK 2 0 (SUTURE) ×2
SUT SILK 2 0 SH CR/8 (SUTURE) ×3 IMPLANT
SUT SILK 2-0 18XBRD TIE 12 (SUTURE) ×2 IMPLANT
SUT SILK 3 0 (SUTURE) ×3
SUT SILK 3 0 SH CR/8 (SUTURE) ×9 IMPLANT
SUT SILK 3-0 18XBRD TIE 12 (SUTURE) ×2 IMPLANT
SUT VIC AB 2-0 SH 18 (SUTURE) ×6 IMPLANT
SUT VICRYL 2 0 18  UND BR (SUTURE) ×2
SUT VICRYL 2 0 18 UND BR (SUTURE) ×4 IMPLANT
SYR 30ML LL (SYRINGE) ×3 IMPLANT
SYS LAPSCP GELPORT 120MM (MISCELLANEOUS)
SYSTEM LAPSCP GELPORT 120MM (MISCELLANEOUS) IMPLANT
TAPE CLOTH SURG 4X10 WHT LF (GAUZE/BANDAGES/DRESSINGS) ×3 IMPLANT
TOWEL OR 17X26 10 PK STRL BLUE (TOWEL DISPOSABLE) ×6 IMPLANT
TRAY FOLEY CATH 14FRSI W/METER (CATHETERS) ×3 IMPLANT
TRAY LAP CHOLE (CUSTOM PROCEDURE TRAY) ×3 IMPLANT
TROCAR XCEL BLUNT TIP 100MML (ENDOMECHANICALS) IMPLANT
TROCAR XCEL NON-BLD 11X100MML (ENDOMECHANICALS) IMPLANT
TROCAR XCEL NON-BLD 5MMX100MML (ENDOMECHANICALS) ×6 IMPLANT
TUBING FILTER THERMOFLATOR (ELECTROSURGICAL) ×3 IMPLANT
YANKAUER SUCT BULB TIP 10FT TU (MISCELLANEOUS) ×3 IMPLANT
YANKAUER SUCT BULB TIP NO VENT (SUCTIONS) ×3 IMPLANT

## 2011-12-29 NOTE — Plan of Care (Signed)
Problem: Diagnosis - Type of Surgery Goal: General Surgical Patient Education (See Patient Education module for education specifics) Outcome: Progressing General education  Problem: Phase I Progression Outcomes Goal: Pain controlled with appropriate interventions Outcome: Progressing tolerated Goal: Incision/dressings dry and intact Outcome: Completed/Met Date Met:  12/29/11 CDI Goal: Initial discharge plan identified Outcome: Progressing Plans for home Goal: Voiding-avoid urinary catheter unless indicated Outcome: Progressing DTV Goal: Vital signs/hemodynamically stable Outcome: Progressing Met

## 2011-12-29 NOTE — H&P (View-Only) (Signed)
Chief Complaint: Right colon cancer  History of Present Illness: Peggy French is an 71 y.o. 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  Impression:  Normal terminal ileum.  Large friable mass and ileocecal valve to for histology.  Two small polyps ablated via cold biopsy from splenic flexure.  Mild sigmoid colon diverticulosis.  External hemorrhoids.  She describes a transrectal tumor resected by surgeons in [place] some years ago. She is aware of the procedure and surgery booklets were given to her.  Past Medical History   Diagnosis  Date   .  Gout    .  Diabetes mellitus      x over 10 yrs   .  Hypertension    .  Hyperlipidemia    .  GERD (gastroesophageal reflux disease)    .  Chest pain     Past Surgical History   Procedure  Date   .  Cholecystectomy    .  Abdominal hysterectomy    .  Colon surgery      Tumor   .  Esophagogastroduodenoscopy  06/16/2011     Procedure: ESOPHAGOGASTRODUODENOSCOPY (EGD); Surgeon: Malissa Hippo, MD; [facility]; Service: Endoscopy; Laterality: N/A; 2:15   .  Colonoscopy  11/26/2011     Procedure: COLONOSCOPY; Surgeon: Malissa Hippo, MD; [facility]; Service: Endoscopy; Laterality: N/A; 2:15    Current Outpatient Prescriptions   Medication  Sig  Dispense  Refill   .  acetaminophen (TYLENOL) 500 MG tablet  Take 1,000 mg by mouth every 6 (six) hours as needed. For pain     .  albuterol (PROVENTIL HFA;VENTOLIN HFA) 108 (90 BASE) MCG/ACT inhaler  Inhale 2 puffs into the lungs every 6 (six) hours as needed. For shortness of breath     .  allopurinol (ZYLOPRIM) 100 MG tablet  Take 100 mg by mouth every morning.     Marland Kitchen  amLODipine (NORVASC) 5 MG tablet  Take 5 mg by mouth every morning.     .  colchicine 0.6 MG tablet  Take 0.6 mg by mouth daily as needed. For severe gout flares     .  diphenoxylate-atropine (LOMOTIL) 2.5-0.025 MG per tablet  Take 1 tablet by mouth every 6 (six)  hours as needed. For diarrhea     .  esomeprazole (NEXIUM) 40 MG capsule  Take 40 mg by mouth every evening.     .  fexofenadine (ALLEGRA) 180 MG tablet  Take 180 mg by mouth daily as needed. For Allergies     .  gabapentin (NEURONTIN) 100 MG capsule  Take 100 mg by mouth at bedtime.     .  hyoscyamine (LEVSIN, ANASPAZ) 0.125 MG tablet  Take 0.125 mg by mouth every 4 (four) hours as needed. For cramping     .  linagliptin (TRADJENTA) 5 MG TABS tablet  Take 5 mg by mouth every morning.     .  propranolol-hydrochlorothiazide (INDERIDE) 40-25 MG per tablet  Take 1 tablet by mouth every morning.     .  ramipril (ALTACE) 5 MG capsule  Take 5 mg by mouth every morning.     .  simvastatin (ZOCOR) 40 MG tablet  Take 40 mg by mouth at bedtime.      Aspirin  History reviewed. No pertinent family history.  Social History: reports that she has never smoked. She does not have any smokeless tobacco history on file. She reports that  she does not drink alcohol or use illicit drugs.  REVIEW OF SYSTEMS - PERTINENT POSITIVES ONLY:  Physical Exam:  Blood pressure 146/80, pulse 72, temperature 96 F (35.6 C), temperature source Temporal, resp. rate 16, height 5' 5.5" (1.664 m), weight 200 lb 6.4 oz (90.901 kg).  Body mass index is 32.84 kg/(m^2).  Gen: WDWN AAF NAD  Neurological: Alert and oriented to person, place, and time. Motor and sensory function is grossly intact  Head: Normocephalic and atraumatic.  Eyes: Conjunctivae are normal. Pupils are equal, round, and reactive to light. No scleral icterus.  Neck: Normal range of motion. Neck supple. No tracheal deviation or thyromegaly present.  Cardiovascular: SR without murmurs or gallops. No carotid bruits  Respiratory: Effort normal. No respiratory distress. No chest wall tenderness. Breath sounds normal. No wheezes, rales or rhonchi.  Abdomen: Nontender. Prior lap chole  GU:  Musculoskeletal: Normal range of motion. Extremities are nontender. No cyanosis,  edema or clubbing noted Lymphadenopathy: No cervical, preauricular, postauricular or axillary adenopathy is present Skin: Skin is warm and dry. No rash noted. No diaphoresis. No erythema. No pallor. Pscyh: Normal mood and affect. Behavior is normal. Judgment and thought content normal.  LABORATORY RESULTS:  No results found for this or any previous visit (from the past 48 hour(s)).  RADIOLOGY RESULTS:  No results found.  Problem List:  Patient Active Problem List   Diagnoses   .  Gout   .  Diabetes mellitus   .  Hypertension   .  Hyperlipidemia    Assessment & Plan:  Ileocecal cancer. Plan lap assisted right hemicolectomy  Matt B. Daphine Deutscher, MD, [facility]  [phone] beeper  [phone]

## 2011-12-29 NOTE — Interval H&P Note (Signed)
History and Physical Interval Note:  12/29/2011 10:56 AM  Peggy French  has presented today for surgery, with the diagnosis of cancer  The various methods of treatment have been discussed with the patient and family. After consideration of risks, benefits and other options for treatment, the patient has consented to  Procedure(s) (LRB): LAPAROSCOPIC PARTIAL COLECTOMY (N/A) as a surgical intervention .  The patient's history has been reviewed, patient examined, no change in status, stable for surgery.  I have reviewed the patients' chart and labs.  Questions were answered to the patient's satisfaction.     Lanier Millon B  There has been no change in the patient's past medical history or physical exam in the past 24 hours to the best of my knowledge. I examined the patient in the holding area and have made any changes to the history and physical exam report that is included above.   Expectations and outcome results have been discussed with the patient to include risks and benefits.  All questions have been answered and we will proceed with previously discussed procedure noted and signed in the consent form in the patient's record.    Stokes Rattigan BMD FACS 10:56 AM  12/29/2011

## 2011-12-29 NOTE — Op Note (Signed)
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 The abdomen was prepped with PCMX and draped sterilely. Timeout was performed. The abdomen was entered with a 0 5 mm Optiview technique the left upper quadrant. Abdomen was insufflated. 2 other ports were placed one slightly to the right of the midline on the left and one on the right.  Adhesions were taken down from the omentum to the falciform ligament. Patient had a previous laparoscopic cholecystectomy and was found to have her colon densely adherent to the gallbladder fossa. The ileocecal region was a little more mobile. First I used a harmonic scalpel to incise the peritoneal flexion and free the right colon. I came around to the gallbladder which I was unable to free from the gallbladder fossa with laparoscopic technique. We had made a transverse incision above the umbilicus in a  crease and carried this down to the abdomen intra-abdominal wall which in size with the Bovie and entered the abdomen without difficulty. The Alexis wound protector was inserted and I then performed a complete mobilization the right colon and brought up and an incision. The tumor appeared to be bigger than it was described endoscopically and involved the cecum. I divided the small intestine proximally to the ileocecal valve a few centimeters back in the terminal ileum with a GIA. I went in the transverse colon divided the seminal. I identified the duodenum or night sweats his back. I then went to the mesentery with the LigaSure and resected the specimen. At the end there appeared to be a palpable node stuck down the duodenum and through the mesentery. He is a harmonic scalpel to tease that out I ligated the vasculature bed a horizontal  mattress of 2-0 silk. I irrigated no bleeding was noted. I then did a functional end-to-end anastomosis by lining the bowel and did doing the side-to-side GIA anastomosis. The common defect was closed in 2 layers with 4-0 PDS and a running canal fashion and a interrupted Lembert 3-0 silk. Mesentery was closed figure-of-eight sutures of 3-0 silk.  Sponge and needle counts reported as correct. The wound was then closed in 2 layers with a running #1 PDS on the internal layer and interrupted #1 Novafil on the anterior fascia. The wound was injected with 40 cc diluted Exparel. Wound was then irrigated more and then closed with staples.  Matt B. Daphine Deutscher, MD, [facility], [place] [phone]

## 2011-12-29 NOTE — Transfer of Care (Signed)
Immediate Anesthesia Transfer of Care Note  Patient: Peggy French  Procedure(s) Performed: Procedure(s) (LRB): LAPAROSCOPIC RIGHT HEMI COLECTOMY (N/A)  Patient Location: PACU  Anesthesia Type: General  Level of Consciousness: awake, sedated and patient cooperative  Airway & Oxygen Therapy: Patient Spontanous Breathing and Patient connected to face mask oxygen  Post-op Assessment: Report given to PACU RN and Post -op Vital signs reviewed and stable  Post vital signs: Reviewed and stable  Complications: No apparent anesthesia complications

## 2011-12-29 NOTE — Progress Notes (Signed)
Pt had golytely and clear liq diet 12/28/11

## 2011-12-29 NOTE — Interval H&P Note (Signed)
History and Physical Interval Note:  12/29/2011 10:54 AM  Peggy French  has presented today for surgery, with the diagnosis of cancer  The various methods of treatment have been discussed with the patient and family. After consideration of risks, benefits and other options for treatment, the patient has consented to  Procedure(s) (LRB): LAPAROSCOPIC PARTIAL COLECTOMY (N/A) as a surgical intervention .  The patient's history has been reviewed, patient examined, no change in status, stable for surgery.  I have reviewed the patients' chart and labs.  Questions were answered to the patient's satisfaction.     Peggy French B  There has been no change in the patient's past medical history or physical exam in the past 24 hours to the best of my knowledge. I examined the patient in the holding area and have made any changes to the history and physical exam report that is included above.   Expectations and outcome results have been discussed with the patient to include risks and benefits.  All questions have been answered and we will proceed with previously discussed procedure noted and signed in the consent form in the patient's record.    Peggy French BMD FACS 10:55 AM  12/29/2011

## 2011-12-29 NOTE — Preoperative (Signed)
Beta Blockers   Reason not to administer Beta Blockers:Not Applicable 

## 2011-12-29 NOTE — Anesthesia Postprocedure Evaluation (Signed)
Anesthesia Post Note  Patient: Peggy French  Procedure(s) Performed: Procedure(s) (LRB): LAPAROSCOPIC RIGHT HEMI COLECTOMY (N/A)  Anesthesia type: General  Patient location: PACU  Post pain: Pain level controlled  Post assessment: Post-op Vital signs reviewed  Last Vitals:  Filed Vitals:   12/29/11 1515  BP: 168/85  Pulse: 73  Temp: 36.1 C  Resp: 15    Post vital signs: Reviewed  Level of consciousness: sedated  Complications: No apparent anesthesia complications

## 2011-12-29 NOTE — Progress Notes (Signed)
Pt states she was not instructed to do a fleets enema but says she feels good and cleaned out from [employer]

## 2011-12-30 LAB — COMPREHENSIVE METABOLIC PANEL
ALT: 14 U/L (ref 0–35)
Alkaline Phosphatase: 75 U/L (ref 39–117)
CO2: 26 mEq/L (ref 19–32)
Calcium: 8.4 mg/dL (ref 8.4–10.5)
Chloride: 95 mEq/L — ABNORMAL LOW (ref 96–112)
GFR calc Af Amer: >90 mL/min (ref >90)
GFR calc non Af Amer: 89 mL/min — ABNORMAL LOW (ref >90)
Glucose, Bld: 203 mg/dL — ABNORMAL HIGH (ref 70–99)
Sodium: 132 mEq/L — ABNORMAL LOW (ref 135–145)
Total Bilirubin: 0.3 mg/dL (ref 0.3–1.2)

## 2011-12-30 LAB — GLUCOSE, CAPILLARY
Glucose-Capillary: 199 mg/dL — ABNORMAL HIGH (ref 70–99)
Glucose-Capillary: 184 mg/dL — ABNORMAL HIGH (ref 70–99)
Glucose-Capillary: 153 mg/dL — ABNORMAL HIGH (ref 70–99)
Glucose-Capillary: 159 mg/dL — ABNORMAL HIGH (ref 70–99)

## 2011-12-30 LAB — CBC
Hemoglobin: 10.1 g/dL — ABNORMAL LOW (ref 12.0–15.0)
MCHC: 32.7 g/dL (ref 30.0–36.0)
Platelets: 327 10*3/uL (ref 150–400)
RDW: 14.3 % (ref 11.5–15.5)

## 2011-12-30 MED ORDER — DIPHENHYDRAMINE HCL 12.5 MG/5ML PO ELIX: 12.5000 mg | ORAL_SOLUTION | Freq: Four times a day (QID) | ORAL | Status: DC | PRN

## 2011-12-30 MED ORDER — ONDANSETRON HCL 4 MG/2ML IJ SOLN: 4.0000 mg | Freq: Four times a day (QID) | INTRAMUSCULAR | Status: DC | PRN

## 2011-12-30 MED ORDER — NALOXONE HCL 0.4 MG/ML IJ SOLN: 0.4000 mg | INTRAMUSCULAR | Status: DC | PRN

## 2011-12-30 MED ORDER — INSULIN ASPART 100 UNIT/ML ~~LOC~~ SOLN: 0.0000 [IU] | Freq: Three times a day (TID) | SUBCUTANEOUS | Status: DC

## 2011-12-30 MED ORDER — DIPHENHYDRAMINE HCL 50 MG/ML IJ SOLN: 12.5000 mg | Freq: Four times a day (QID) | INTRAMUSCULAR | Status: DC | PRN

## 2011-12-30 MED ORDER — INSULIN ASPART 100 UNIT/ML ~~LOC~~ SOLN
0.0000 [IU] | Freq: Four times a day (QID) | SUBCUTANEOUS | Status: DC
  Administered 2011-12-31 (×3): 2 [IU] via SUBCUTANEOUS
  Administered 2011-12-31: 3 [IU] via SUBCUTANEOUS
  Administered 2012-01-01 – 2012-01-03 (×5): 2 [IU] via SUBCUTANEOUS
  Administered 2012-01-03: 3 [IU] via SUBCUTANEOUS
  Administered 2012-01-03 – 2012-01-04 (×3): 2 [IU] via SUBCUTANEOUS

## 2011-12-30 MED ORDER — INSULIN ASPART 100 UNIT/ML ~~LOC~~ SOLN: 0.0000 [IU] | SUBCUTANEOUS | Status: DC

## 2011-12-30 MED ORDER — HYDROMORPHONE 0.3 MG/ML IV SOLN
INTRAVENOUS | Status: DC
  Administered 2011-12-30: 0.399 mg via INTRAVENOUS
  Administered 2011-12-30: 10:00:00 via INTRAVENOUS
  Administered 2011-12-30: 0.6 mg via INTRAVENOUS
  Administered 2011-12-31: 0.399 mg via INTRAVENOUS
  Administered 2011-12-31 (×3): 0.2 mg via INTRAVENOUS
  Administered 2011-12-31: 0.201 mg via INTRAVENOUS
  Administered 2011-12-31: 0.2 mg via INTRAVENOUS
  Administered 2012-01-01: 0.3 mg via INTRAVENOUS
  Administered 2012-01-01: 0.4 mg via INTRAVENOUS
  Administered 2012-01-01: 0.2 mg via INTRAVENOUS
  Administered 2012-01-01: 0.1999 mg via INTRAVENOUS
  Administered 2012-01-02: 0.2 mg via INTRAVENOUS
  Administered 2012-01-02 (×2): 0.399 mg via INTRAVENOUS
  Administered 2012-01-02: 0.3 mg via INTRAVENOUS
  Administered 2012-01-02: 0.599 mg via INTRAVENOUS
  Administered 2012-01-02: 14:00:00 via INTRAVENOUS
  Administered 2012-01-02: 0.2 mg via INTRAVENOUS
  Administered 2012-01-03: 0.4 mg via INTRAVENOUS
  Administered 2012-01-03: 0.399 mg via INTRAVENOUS
  Administered 2012-01-03: 0.2 mg via INTRAVENOUS
  Administered 2012-01-03: 0.4 mg via INTRAVENOUS
  Administered 2012-01-03: 0.2 mg via INTRAVENOUS
  Administered 2012-01-03: 0.399 mg via INTRAVENOUS
  Administered 2012-01-04: 0.4 mg via INTRAVENOUS
  Administered 2012-01-04: 0.5 mg via INTRAVENOUS
  Administered 2012-01-04: 0.199 mg via INTRAVENOUS
  Administered 2012-01-04 (×2): 0.2 mg via INTRAVENOUS
  Filled 2011-12-30 (×2): qty 25

## 2011-12-30 MED ORDER — SODIUM CHLORIDE 0.9 % IJ SOLN: 9.0000 mL | INTRAMUSCULAR | Status: DC | PRN

## 2011-12-30 NOTE — Progress Notes (Signed)
Patient ID: Peggy French, female   DOB: 06-May-1941, 71 y.o.   MRN: [id number] [facility] Progress Note:   1 Day Post-Op  Subjective: Mental status is clear Objective: Vital signs in last 24 hours: Temp:  [97 F (36.1 C)-98.5 F (36.9 C)] 98.5 F (36.9 C) (06/20 0559) Pulse Rate:  [72-116] 105  (06/20 0559) Resp:  [11-20] 18  (06/20 0559) BP: (140-169)/(81-98) 152/90 mmHg (06/20 0559) SpO2:  [99 %-100 %] 100 % (06/20 0559) Weight:  [196 lb 4 oz (89.018 kg)] 196 lb 4 oz (89.018 kg) (06/19 0836)  Intake/Output from previous day: 06/19 0701 - 06/20 0700 In: 4525 [I.V.:3800] Out: 1125 [Urine:825; Blood:300] Intake/Output this shift:    Physical Exam: Work of breathing is  Normal.  Sore.  No flatus.  Path pending  Lab Results:  Results for orders placed during the hospital encounter of 12/29/11 (from the past 48 hour(s))  CBC     Status: Abnormal   Collection Time   12/29/11  9:00 AM      Component Value Range Comment   WBC 5.7  4.0 - 10.5 K/uL    RBC 4.37  3.87 - 5.11 MIL/uL    Hemoglobin 10.2 (*) 12.0 - 15.0 g/dL    HCT 40.9 (*) 81.1 - 46.0 %    MCV 72.5 (*) 78.0 - 100.0 fL    MCH 23.3 (*) 26.0 - 34.0 pg    MCHC 32.2  30.0 - 36.0 g/dL    RDW 91.4  78.2 - 95.6 %    Platelets 380  150 - 400 K/uL   BASIC METABOLIC PANEL     Status: Abnormal   Collection Time   12/29/11  9:00 AM      Component Value Range Comment   Sodium 137  135 - 145 mEq/L    Potassium 2.8 (*) 3.5 - 5.1 mEq/L    Chloride 97  96 - 112 mEq/L    CO2 27  19 - 32 mEq/L    Glucose, Bld 155 (*) 70 - 99 mg/dL    BUN 6  6 - 23 mg/dL    Creatinine, Ser 2.13  0.50 - 1.10 mg/dL    Calcium 9.3  8.4 - 08.6 mg/dL    GFR calc non Af Amer 72 (*) >90 mL/min    GFR calc Af Amer 84 (*) >90 mL/min   TYPE AND SCREEN     Status: Normal   Collection Time   12/29/11  9:00 AM      Component Value Range Comment   ABO/RH(D) B POS      Antibody Screen NEG      Sample Expiration 01/01/2012     GLUCOSE,  CAPILLARY     Status: Abnormal   Collection Time   12/29/11  9:02 AM      Component Value Range Comment   Glucose-Capillary 144 (*) 70 - 99 mg/dL    Comment 1 Documented in Chart     GLUCOSE, CAPILLARY     Status: Abnormal   Collection Time   12/29/11  2:28 PM      Component Value Range Comment   Glucose-Capillary 148 (*) 70 - 99 mg/dL    Comment 1 Documented in Chart      Comment 2 Notify RN     GLUCOSE, CAPILLARY     Status: Abnormal   Collection Time   12/29/11  5:15 PM      Component Value Range Comment   Glucose-Capillary 138 (*)  70 - 99 mg/dL   GLUCOSE, CAPILLARY     Status: Abnormal   Collection Time   12/29/11  7:48 PM      Component Value Range Comment   Glucose-Capillary 131 (*) 70 - 99 mg/dL   COMPREHENSIVE METABOLIC PANEL     Status: Abnormal   Collection Time   12/30/11  3:50 AM      Component Value Range Comment   Sodium 132 (*) 135 - 145 mEq/L    Potassium 3.6  3.5 - 5.1 mEq/L    Chloride 95 (*) 96 - 112 mEq/L    CO2 26  19 - 32 mEq/L    Glucose, Bld 203 (*) 70 - 99 mg/dL    BUN 4 (*) 6 - 23 mg/dL    Creatinine, Ser 8.29  0.50 - 1.10 mg/dL    Calcium 8.4  8.4 - 56.2 mg/dL    Total Protein 6.8  6.0 - 8.3 g/dL    Albumin 2.6 (*) 3.5 - 5.2 g/dL    AST 27  0 - 37 U/L    ALT 14  0 - 35 U/L    Alkaline Phosphatase 75  39 - 117 U/L    Total Bilirubin 0.3  0.3 - 1.2 mg/dL    GFR calc non Af Amer 89 (*) >90 mL/min    GFR calc Af Amer >90  >90 mL/min   CBC     Status: Abnormal   Collection Time   12/30/11  3:50 AM      Component Value Range Comment   WBC 11.0 (*) 4.0 - 10.5 K/uL WHITE COUNT CONFIRMED ON SMEAR   RBC 4.27  3.87 - 5.11 MIL/uL    Hemoglobin 10.1 (*) 12.0 - 15.0 g/dL    HCT 13.0 (*) 86.5 - 46.0 %    MCV 72.4 (*) 78.0 - 100.0 fL    MCH 23.7 (*) 26.0 - 34.0 pg    MCHC 32.7  30.0 - 36.0 g/dL    RDW 78.4  69.6 - 29.5 %    Platelets 327  150 - 400 K/uL     Radiology/Results: No results found.  Anti-infectives: Anti-infectives     Start      Dose/Rate Route Frequency Ordered Stop   12/28/11 1923   cefOXitin (MEFOXIN) 2 g in dextrose 5 % 50 mL IVPB        2 g 100 mL/hr over 30 Minutes Intravenous 60 min pre-op 12/28/11 1923 12/29/11 1120          Assessment/Plan: Problem List: Patient Active Problem List  Diagnosis  . Gout  . Diabetes mellitus  . Hypertension  . Hyperlipidemia  . Cancer of the ileocecal junction    Will change to PCA for more optimal pain control 1 Day Post-Op    LOS: 1 day   Matt B. Daphine Deutscher, MD, [facility] [phone] beeper [phone]  12/30/2011 8:17 AM

## 2011-12-30 NOTE — Progress Notes (Signed)
Low urine output postop- voided 150 cc ,bladder scanned - urinary retention,DR. Toth notified and order received to insert Foley Catheter and leave in.Foley inserted and drained 725cc.

## 2011-12-31 LAB — GLUCOSE, CAPILLARY
Glucose-Capillary: 117 mg/dL — ABNORMAL HIGH (ref 70–99)
Glucose-Capillary: 125 mg/dL — ABNORMAL HIGH (ref 70–99)
Glucose-Capillary: 148 mg/dL — ABNORMAL HIGH (ref 70–99)

## 2011-12-31 NOTE — Progress Notes (Signed)
General Surgery Note  LOS: 2 days  POD# 2  Assessment/Plan: 1.  LAPAROSCOPIC RIGHT HEMI COLECTOMY for Cancer of the ileocecal junction - M. Daphine Deutscher - 12/29/2011  Doing okay, but still has ileus.  Continue ice chips only  2.  Diabetes Mellitus  Glucose - 203 3.  Hypertension 4.  DVT - on SQ Heparin 5.  D/C foley  Subjective:  Doing well.  No BM or flatus yet. Objective:   Filed Vitals:   12/31/11 0615  BP: 142/80  Pulse: 85  Temp: 97.5 F (36.4 C)  Resp: 18     Intake/Output from previous day:  06/20 0701 - 06/21 0700 In: 3011.7 [I.V.:3011.7] Out: 1950 [Urine:1950]  Intake/Output this shift:      Physical Exam:   General: WN AA F who is alert and oriented.    HEENT: Normal. Pupils equal. .   Lungs: Clear.  IS at about 600 cc   Abdomen: Soft.  Quiet.   Wound: Looks good.   Neurologic:  Grossly intact to motor and sensory function.   Psychiatric: Has normal mood and affect. Behavior is normal   Lab Results:    Basename 12/30/11 0350 12/29/11 0900  WBC 11.0* 5.7  HGB 10.1* 10.2*  HCT 30.9* 31.7*  PLT 327 380    BMET   Basename 12/30/11 0350 12/29/11 0900  NA 132* 137  K 3.6 2.8*  CL 95* 97  CO2 26 27  GLUCOSE 203* 155*  BUN 4* 6  CREATININE 0.63 0.81  CALCIUM 8.4 9.3    PT/INR  No results found for this basename: LABPROT:2,INR:2 in the last 72 hours  ABG  No results found for this basename: PHART:2,PCO2:2,PO2:2,HCO3:2 in the last 72 hours   Studies/Results:  No results found.   Anti-infectives:   Anti-infectives     Start     Dose/Rate Route Frequency Ordered Stop   12/28/11 1923   cefOXitin (MEFOXIN) 2 g in dextrose 5 % 50 mL IVPB        2 g 100 mL/hr over 30 Minutes Intravenous 60 min pre-op 12/28/11 1923 12/29/11 1120          Ovidio Kin, MD, FACS Pager: [phone],   [facility] Office: [phone] 12/31/2011

## 2012-01-01 DIAGNOSIS — E119 Type 2 diabetes mellitus without complications: Secondary | ICD-10-CM | POA: Diagnosis not present

## 2012-01-01 DIAGNOSIS — I10 Essential (primary) hypertension: Secondary | ICD-10-CM

## 2012-01-01 LAB — GLUCOSE, CAPILLARY: Glucose-Capillary: 115 mg/dL — ABNORMAL HIGH (ref 70–99)

## 2012-01-01 MED ORDER — PANTOPRAZOLE SODIUM 40 MG PO TBEC
40.0000 mg | DELAYED_RELEASE_TABLET | Freq: Every day | ORAL | Status: DC
  Administered 2012-01-01 – 2012-01-04 (×4): 40 mg via ORAL
  Filled 2012-01-01 (×4): qty 1

## 2012-01-01 NOTE — Progress Notes (Signed)
3 Days Post-Op  Subjective: Feeling good.  Had a BM yesterday.  No nausea.  Objective: Vital signs in last 24 hours: Temp:  [97.6 F (36.4 C)-98.7 F (37.1 C)] 97.6 F (36.4 C) (06/22 0500) Pulse Rate:  [70-76] 70  (06/22 0500) Resp:  [18-28] 18  (06/22 0829) BP: (108-134)/(56-72) 127/56 mmHg (06/22 0500) SpO2:  [99 %-100 %] 99 % (06/22 0829) Last BM Date: 12/31/11  Intake/Output from previous day: 06/21 0701 - 06/22 0700 In: 2386.7 [I.V.:2386.7] Out: 2150 [Urine:2150] Intake/Output this shift:    PE: Abd-soft, incisions clean, dry, and intact  Lab Results:   [facility] 12/30/11 0350  WBC 11.0*  HGB 10.1*  HCT 30.9*  PLT 327   BMET  Basename 12/30/11 0350  NA 132*  K 3.6  CL 95*  CO2 26  GLUCOSE 203*  BUN 4*  CREATININE 0.63  CALCIUM 8.4   PT/INR No results found for this basename: LABPROT:2,INR:2 in the last 72 hours Comprehensive Metabolic Panel:    Component Value Date/Time   NA 132* 12/30/2011 0350   K 3.6 12/30/2011 0350   CL 95* 12/30/2011 0350   CO2 26 12/30/2011 0350   BUN 4* 12/30/2011 0350   CREATININE 0.63 12/30/2011 0350   CREATININE 0.91 12/02/2011 1159   GLUCOSE 203* 12/30/2011 0350   CALCIUM 8.4 12/30/2011 0350   AST 27 12/30/2011 0350   ALT 14 12/30/2011 0350   ALKPHOS 75 12/30/2011 0350   BILITOT 0.3 12/30/2011 0350   PROT 6.8 12/30/2011 0350   ALBUMIN 2.6* 12/30/2011 0350   Pathology-T3N2a-discussed this with her.  Studies/Results: No results found.  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  HTN-well controlled  DM-CBGs 117-148    LOS: 3 days   Plan: Start liquid diet.  Decrease  IVF.  Will need outpatient Oncology consult.   Peggy French 01/01/2012

## 2012-01-02 LAB — GLUCOSE, CAPILLARY
Glucose-Capillary: 143 mg/dL — ABNORMAL HIGH (ref 70–99)
Glucose-Capillary: 109 mg/dL — ABNORMAL HIGH (ref 70–99)

## 2012-01-02 NOTE — Progress Notes (Signed)
4 Days Post-Op  Subjective: Does not feel as well today.  Some crampy abdominal pain.  Passing a little gas.  Feels a little bloated.  Objective: Vital signs in last 24 hours: Temp:  [97.7 F (36.5 C)-98.1 F (36.7 C)] 97.7 F (36.5 C) (06/23 0550) Pulse Rate:  [62-86] 86  (06/23 0550) Resp:  [13-20] 18  (06/23 0802) BP: (119-137)/(52-83) 132/82 mmHg (06/23 0550) SpO2:  [93 %-100 %] 99 % (06/23 0802) FiO2 (%):  [95 %-99 %] 95 % (06/23 0342) Last BM Date: 01/01/12  Intake/Output from previous day: 06/22 0701 - 06/23 0700 In: 2425 [P.O.:520; I.V.:1905] Out: 1725 [Urine:1725] Intake/Output this shift: Total I/O In: 360 [P.O.:360] Out: -   PE: Abd-soft, incisions clean, dry, and intact, few bowels sounds  Lab Results:  No results found for this basename: WBC:2,HGB:2,HCT:2,PLT:2 in the last 72 hours BMET No results found for this basename: NA:2,K:2,CL:2,CO2:2,GLUCOSE:2,BUN:2,CREATININE:2,CALCIUM:2 in the last 72 hours PT/INR No results found for this basename: LABPROT:2,INR:2 in the last 72 hours Comprehensive Metabolic Panel:    Component Value Date/Time   NA 132* 12/30/2011 0350   K 3.6 12/30/2011 0350   CL 95* 12/30/2011 0350   CO2 26 12/30/2011 0350   BUN 4* 12/30/2011 0350   CREATININE 0.63 12/30/2011 0350   CREATININE 0.91 12/02/2011 1159   GLUCOSE 203* 12/30/2011 0350   CALCIUM 8.4 12/30/2011 0350   AST 27 12/30/2011 0350   ALT 14 12/30/2011 0350   ALKPHOS 75 12/30/2011 0350   BILITOT 0.3 12/30/2011 0350   PROT 6.8 12/30/2011 0350   ALBUMIN 2.6* 12/30/2011 0350   Pathology-T3N2a-discussed this with her.  Studies/Results: No results found.  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  Continue clear liquids.  Will need outpatient Oncology consult.   Mykenna Viele Shela Commons 01/02/2012

## 2012-01-03 LAB — GLUCOSE, CAPILLARY

## 2012-01-03 NOTE — Progress Notes (Signed)
Patient ID: Peggy French, female   DOB: August 02, 1940, 71 y.o.   MRN: [id number] [facility] Progress Note:   5 Days Post-Op  Subjective: Mental status is clear Objective: Vital signs in last 24 hours: Temp:  [97.7 F (36.5 C)-98.7 F (37.1 C)] 98.7 F (37.1 C) (06/24 0453) Pulse Rate:  [70-87] 71  (06/24 0453) Resp:  [18-23] 18  (06/24 0809) BP: (119-146)/(71-82) 146/79 mmHg (06/24 0453) SpO2:  [94 %-100 %] 98 % (06/24 0809) FiO2 (%):  [100 %] 100 % (06/23 1612)  Intake/Output from previous day: 06/23 0701 - 06/24 0700 In: 2400 [P.O.:600; I.V.:1800] Out: 2300 [Urine:2300] Intake/Output this shift:    Physical Exam: Work of breathing is  Normal.  Bowels working now.  No pain.  Wants to readvance diet.    Lab Results:  Results for orders placed during the hospital encounter of 12/29/11 (from the past 48 hour(s))  GLUCOSE, CAPILLARY     Status: Abnormal   Collection Time   01/01/12 11:42 AM      Component Value Range Comment   Glucose-Capillary 115 (*) 70 - 99 mg/dL   GLUCOSE, CAPILLARY     Status: Abnormal   Collection Time   01/01/12  6:08 PM      Component Value Range Comment   Glucose-Capillary 130 (*) 70 - 99 mg/dL   GLUCOSE, CAPILLARY     Status: Abnormal   Collection Time   01/01/12 11:39 PM      Component Value Range Comment   Glucose-Capillary 100 (*) 70 - 99 mg/dL   GLUCOSE, CAPILLARY     Status: Abnormal   Collection Time   01/02/12  5:53 AM      Component Value Range Comment   Glucose-Capillary 136 (*) 70 - 99 mg/dL   GLUCOSE, CAPILLARY     Status: Abnormal   Collection Time   01/02/12 11:40 AM      Component Value Range Comment   Glucose-Capillary 143 (*) 70 - 99 mg/dL   GLUCOSE, CAPILLARY     Status: Abnormal   Collection Time   01/02/12  5:42 PM      Component Value Range Comment   Glucose-Capillary 109 (*) 70 - 99 mg/dL   GLUCOSE, CAPILLARY     Status: Abnormal   Collection Time   01/03/12 12:01 AM      Component Value Range Comment   Glucose-Capillary 120 (*) 70 - 99 mg/dL     Radiology/Results: No results found.  Anti-infectives: Anti-infectives     Start     Dose/Rate Route Frequency Ordered Stop   12/28/11 1923   cefOXitin (MEFOXIN) 2 g in dextrose 5 % 50 mL IVPB        2 g 100 mL/hr over 30 Minutes Intravenous 60 min pre-op 12/28/11 1923 12/29/11 1120          Assessment/Plan: Problem List: Patient Active Problem List  Diagnosis  . Gout  . Diabetes mellitus  . Hypertension  . Hyperlipidemia  . Cancer of the ileocecal junction    Will advance to carb mod regular diet.  5 Days Post-Op    LOS: 5 days   Matt B. Daphine Deutscher, MD, [facility] [phone] beeper [phone]  01/03/2012 8:11 AM

## 2012-01-04 LAB — GLUCOSE, CAPILLARY: Glucose-Capillary: 139 mg/dL — ABNORMAL HIGH (ref 70–99)

## 2012-01-04 MED ORDER — OXYCODONE-ACETAMINOPHEN 5-325 MG PO TABS: 1.0000 | ORAL_TABLET | ORAL | Status: AC | PRN

## 2012-01-04 NOTE — Discharge Summary (Signed)
Physician Discharge Summary  Patient ID: Peggy French MRN: [id number] DOB/AGE: [birth year] 70 y.o.  Admit date: 12/29/2011 Discharge date: 01/04/2012  Admission Diagnoses:  Ileocecal carcinoma  Discharge Diagnoses: T3 N 2a-5/13 nodes positive  Active Problems:  Hypertension  Cancer of the ileocecal junction   Surgery:  Lap assisted right hemicolectomy   Discharged Condition: improved  Hospital Course:   Had surgery.  NPO with ileus.  DM monitored.  As soon as bowel sounds became active, started on liquids and advance.  Ready to go home on June 25  Consults: none  Significant Diagnostic Studies: path    Discharge Exam: Blood pressure 125/76, pulse 76, temperature 98.5 F (36.9 C), temperature source Oral, resp. rate 20, height 5\' 3"  (1.6 m), weight 196 lb 4 oz (89.018 kg), SpO2 99.00%. Staples out.  Incisions look good.    Disposition: 01-Home or Self Care  Discharge Orders    Future Appointments: Provider: Department: Dept Phone: Center:   01/12/2012 2:50 PM Valarie Merino, MD [facility] [phone] None     Future Orders Please Complete By Expires   Diet - low sodium heart healthy      Increase activity slowly      Discharge instructions      Comments:   May shower and shampoo Advance diet as tolerated   No dressing needed      Call MD for:  redness, tenderness, or signs of infection (pain, swelling, redness, odor or green/yellow discharge around incision site)      Call MD for:  persistant nausea and vomiting        Medication List  As of 01/04/2012  2:30 PM   STOP taking these medications         diphenoxylate-atropine 2.5-0.025 MG per tablet         TAKE these medications         acetaminophen 500 MG tablet   Commonly known as: TYLENOL   Take 1,000 mg by mouth every 6 (six) hours as needed. For pain      albuterol 108 (90 BASE) MCG/ACT inhaler   Commonly known as: PROVENTIL HFA;VENTOLIN HFA   Inhale 2 puffs into the lungs every 6 (six) hours  as needed. For shortness of breath      allopurinol 100 MG tablet   Commonly known as: ZYLOPRIM   Take 100 mg by mouth every morning.      amLODipine 5 MG tablet   Commonly known as: NORVASC   Take 5 mg by mouth every morning.      colchicine 0.6 MG tablet   Take 0.6 mg by mouth daily as needed. For severe gout flares      esomeprazole 40 MG capsule   Commonly known as: NEXIUM   Take 40 mg by mouth every evening.      fexofenadine 180 MG tablet   Commonly known as: ALLEGRA   Take 180 mg by mouth daily as needed. For Allergies      gabapentin 100 MG capsule   Commonly known as: NEURONTIN   Take 100 mg by mouth at bedtime.      hyoscyamine 0.125 MG tablet   Commonly known as: LEVSIN, ANASPAZ   Take 0.125 mg by mouth every 4 (four) hours as needed. For cramping      linagliptin 5 MG Tabs tablet   Commonly known as: TRADJENTA   Take 5 mg by mouth every morning.      oxyCODONE-acetaminophen 5-325 MG per tablet  Commonly known as: PERCOCET   Take 1 tablet by mouth every 4 (four) hours as needed for pain.      propranolol-hydrochlorothiazide 40-25 MG per tablet   Commonly known as: INDERIDE   Take 1 tablet by mouth every morning.      ramipril 5 MG capsule   Commonly known as: ALTACE   Take 5 mg by mouth every morning.      simvastatin 40 MG tablet   Commonly known as: ZOCOR   Take 40 mg by mouth at bedtime.           Follow-up Information    Follow up with Tida Saner B, MD. Call in 2 weeks.   Contact information:   [employer] [address] [place] [address] [phone]          Signed: Valarie Merino 01/04/2012, 2:30 PM

## 2012-01-04 NOTE — Discharge Instructions (Signed)
20 Peggy French  12/23/2011   Your procedure is scheduled on:  12/29/11  Report to SHORT STAY DEPT  at 8:30 AM.  Call this number if you have problems the morning of surgery: [phone]   Remember:   Do not eat food or drink liquids AFTER MIDNIGHT    Take these medicines the morning of surgery with A SIP OF WATER: norvasc   Do not wear jewelry, make-up or nail polish.  Do not wear lotions, powders, or perfumes.   Do not shave legs or underarms 48 hrs. before surgery (men may shave face)  Do not bring valuables to the hospital.  Contacts, dentures or bridgework may not be worn into surgery.  Leave suitcase in the car. After surgery it may be brought to your room.  For patients admitted to the hospital, checkout time is 11:00 AM the day of discharge.   Patients discharged the day of surgery will not be allowed to drive home.    Special Instructions:   Please read over the following fact sheets that you were given: MRSA  Information               SHOWER WITH BETASEPT THE NIGHT BEFORE SURGERY AND THE MORNING OF SURGERY   FOLLOW BOWEL PREP INSTRUCTIONS FROM OFFICECancer of the Colon, Treatment by Resection You and your caregiver have decided that surgical removal of your colon cancer is the best form of treatment for you. Your surgeon or surgeons will do their best to remove your entire tumor. To do this, some normal tissue must also be removed to give you the best chance for a cure. The following will help describe what happens when you have this surgery. TREATMENT  Surgery is the most common treatment for colorectal cancer. It is a type of local therapy. It treats the cancer in the colon or rectum and the area close to the tumor by removing the tumor and some of the healthy tissue around it. For larger cancers, your surgeon must make an cut (incision) into the belly (abdomen) so he or she can see the area of the tumor and remove it as well as part of the healthy colon or rectum. Some  nearby lymph nodes also may be removed. The surgeon checks the rest of the abdomen, the intestine and the liver to see if the cancer has spread. When a section of the colon or rectum is removed, the surgeon can usually reconnect the healthy parts. However, sometimes reconnection is not possible. In this case, the surgeon creates a new path for waste to leave the body. The surgeon makes an opening (a stoma) in the wall of the abdomen. The upper end of the intestine is then connected to the stoma. The other end is closed. The operation to create the stoma is called a colostomy. A flat bag fits over the stoma to collect waste, and a special adhesive holds it in place.  Some colostomies are temporary. The colostomy is needed only until the colon or rectum heals from surgery. After healing takes place, the surgeon reconnects the parts of the intestine and closes the stoma. Other patients need a permanent colostomy.  ASK YOUR CAREGIVER THESE QUESTIONS BEFORE HAVING SURGERY:  What kind of operation do you recommend for me?   Do I need any lymph nodes removed? Will other tissues be removed? Why?   What are the risks of surgery? Will I have any lasting side effects?   Will I need a colostomy? If  so, will it be permanent?   How will I feel after the operation?   If I have pain, how will it be controlled?   How long will I be in the hospital?   When can I get back to my normal activities?  FOLLOW-UP CARE  Follow-up care after treatment for colorectal cancer is important. Even when the cancer seems to have been completely removed or destroyed, the disease sometimes returns. Undetected cancer cells may still remain somewhere in the body after treatment. The doctor keeps checking the person's recovery and checks for recurrence of the cancer. Recurrence means that the cancer comes back.  Checkups help make sure that changes in health are found. Checkups may include:  A physical exam (including a digital  rectal exam). This means your caregiver checks you to see if there are any abnormal changes they can see or feel.   Lab tests (including fecal occult blood test and [name] test) may be done. The "fecal occult blood test" checks for blood in the stool. The [name] (carcinoembryonic antigen) is a blood test that looks for a marker of colon cancer in the blood.   A colonoscopy is a test where your caregiver examines your colon with a flexible instrument like a thin telescope which looks at the inside of the large bowel.   Other specialized x-rays, CT scans, or other tests may be performed.  Between scheduled visits you should contact your caregivers as soon as any health problems appear. Document Released: 07/01/2003 Document Revised: 06/17/2011 Document Reviewed: 10/24/2007 [facility] Patient Information 2012 [place], [place].

## 2012-01-06 ENCOUNTER — Telehealth (INDEPENDENT_AMBULATORY_CARE_PROVIDER_SITE_OTHER): Payer: Self-pay | Admitting: General Surgery

## 2012-01-06 NOTE — Telephone Encounter (Signed)
PT CALLED TO REPORT DRAINAGE FROM INCISION NEAR UMBILICUS AFTER COLON SURGERY/ SHE NOTED THIS THIS AM. PINK TINGED-YELLOW DRAINAGE NOTED ON NIGHT GOWN. NO FEVER OR NAUSEA OR VOMITING REPORTED. PT HAS HAD BOWEL MOVEMENT/ I REVIEWED THIS WITH DR. MARTIN AND HE ADVISED HER TO APPLY A STERILE GAUZE AND MONITOR DRAINAGE TODAY TO SEE HOW MANY TIMES SHE HAS TO CHANGE THE DRESSING. SHE WILL CALL IN AM TO REPORT ANY DRAINAGE CHANGE, OR WILL CALL SOONER IF FEVER OR REDNESS NOTED AROUND INCISION, OR INCREASED DRAINAGE/PT ADVISED/GY

## 2012-01-07 ENCOUNTER — Encounter (INDEPENDENT_AMBULATORY_CARE_PROVIDER_SITE_OTHER): Payer: Self-pay | Admitting: Surgery

## 2012-01-07 ENCOUNTER — Ambulatory Visit (INDEPENDENT_AMBULATORY_CARE_PROVIDER_SITE_OTHER): Payer: Medicare Other | Admitting: Surgery

## 2012-01-07 VITALS — BP 142/78 | Temp 96.9°F | Resp 16 | Ht 63.75 in | Wt 196.5 lb

## 2012-01-07 DIAGNOSIS — Z9049 Acquired absence of other specified parts of digestive tract: Secondary | ICD-10-CM

## 2012-01-07 DIAGNOSIS — Z9889 Other specified postprocedural states: Secondary | ICD-10-CM

## 2012-01-07 MED ORDER — CEPHALEXIN 500 MG PO CAPS: 500.0000 mg | ORAL_CAPSULE | Freq: Two times a day (BID) | ORAL | Status: AC

## 2012-01-07 NOTE — Progress Notes (Signed)
Peggy French 71 y.o.  Body mass index is 33.99 kg/(m^2).  Patient Active Problem List  Diagnosis  . Gout  . Diabetes mellitus  . Hypertension  . Hyperlipidemia  . Cancer of the ileocecal junction  . S/P right colectomy    Allergies  Allergen Reactions  . Aspirin Palpitations    Past Surgical History  Procedure Date  . Cholecystectomy   . Abdominal hysterectomy   . Colon surgery     Tumor  . Esophagogastroduodenoscopy 06/16/2011    Procedure: ESOPHAGOGASTRODUODENOSCOPY (EGD);  Surgeon: Malissa Hippo, MD;  [facility];  Service: Endoscopy;  Laterality: N/A;  2:15  . Colonoscopy 11/26/2011    Procedure: COLONOSCOPY;  Surgeon: Malissa Hippo, MD;  [facility]: [facility];  Service: Endoscopy;  Laterality: N/A;  2:15  . Cardiac catheterization 2001,2012  . Appendectomy    HILL,GERALD K, MD 1. S/P right colectomy     Came in for wound drainage.  She reports the lateral aspect of her incision opened up a bit and she had some bloody drainage canal. This looks like a draining seroma. It is non foul smelling. Wound is not read or tender. I probed this with a Q-tip swab and then irrigated with hydrogen peroxide. Because of her diabetes I will start her on Keflex 500 mg twice a day and hopefully this will stop in the next few days. I've given her some dressings to apply and we'll see her at her regularly scheduled appointment. Impression spontaneous drainage of seroma from her right colectomy side. Matt B. Daphine Deutscher, MD, [facility] [phone] beeper [phone]  01/07/2012 3:07 PM

## 2012-01-07 NOTE — Patient Instructions (Addendum)
Thanks for your patience.  If you need further assistance after leaving the office, please call our office and speak with French Ana A.  [phone].  If you want to leave a message for Dr. Daphine Deutscher, please call his office phone at [phone].  Here wound appears to have drained a seroma. This is a collection of blood and serum in the fatty space of the wound. The material does not appear to be infected.  We look for redness in the wound as a sign of infection. Synthroid diabetic I will prescribe Keflex 500 mg twice a day for 5 days. Check with our office next week and let us know how you're doing. Otherwise I will see [facility] her previously scheduled appointment.

## 2012-01-10 ENCOUNTER — Telehealth (INDEPENDENT_AMBULATORY_CARE_PROVIDER_SITE_OTHER): Payer: Self-pay | Admitting: General Surgery

## 2012-01-12 ENCOUNTER — Encounter (INDEPENDENT_AMBULATORY_CARE_PROVIDER_SITE_OTHER): Payer: Self-pay | Admitting: Surgery

## 2012-01-12 ENCOUNTER — Ambulatory Visit (INDEPENDENT_AMBULATORY_CARE_PROVIDER_SITE_OTHER): Payer: Medicare Other | Admitting: Surgery

## 2012-01-12 VITALS — BP 130/78 | HR 68 | Temp 97.4°F | Resp 16 | Ht 63.75 in | Wt 192.0 lb

## 2012-01-12 DIAGNOSIS — C182 Malignant neoplasm of ascending colon: Secondary | ICD-10-CM

## 2012-01-12 NOTE — Patient Instructions (Addendum)
Continue to dress this area and let it drain.   Return to see Dr. Daphine Deutscher in about 1 week.

## 2012-01-12 NOTE — Progress Notes (Signed)
Peggy French 71 y.o.  Body mass index is 33.22 kg/(m^2).  Patient Active Problem List  Diagnosis  . Gout  . Diabetes mellitus  . Hypertension  . Hyperlipidemia  . Cancer of the ileocecal junction  . S/P right colectomy    Allergies  Allergen Reactions  . Aspirin Palpitations    Past Surgical History  Procedure Date  . Cholecystectomy   . Abdominal hysterectomy   . Colon surgery     Tumor  . Esophagogastroduodenoscopy 06/16/2011    Procedure: ESOPHAGOGASTRODUODENOSCOPY (EGD);  Surgeon: Malissa Hippo, MD;  [facility];  Service: Endoscopy;  Laterality: N/A;  2:15  . Colonoscopy 11/26/2011    Procedure: COLONOSCOPY;  Surgeon: Malissa Hippo, MD;  [facility];  Service: Endoscopy;  Laterality: N/A;  2:15  . Cardiac catheterization 2001,2012  . Appendectomy    HILL,GERALD K, MD 1. Cancer of the ileocecal junction     [name] comes in today and I probed the wound and it's about an inch deep in the little area and the skin is without half a centimeter in diameter. There still little serous drainage. She has 2 more days of antibiotics which she will complete.   The  wound is not red and not tender.  Will see back in one week or so and check the wound. In the meantime she'll continue to apply dressings allow this to drain. Matt B. Daphine Deutscher, MD, [facility] [phone] beeper [phone]  01/12/2012 3:41 PM

## 2012-01-19 ENCOUNTER — Encounter (INDEPENDENT_AMBULATORY_CARE_PROVIDER_SITE_OTHER): Payer: Self-pay | Admitting: Surgery

## 2012-01-19 ENCOUNTER — Ambulatory Visit (INDEPENDENT_AMBULATORY_CARE_PROVIDER_SITE_OTHER): Payer: Medicare Other | Admitting: Surgery

## 2012-01-19 VITALS — BP 128/72 | HR 76 | Temp 97.8°F | Resp 18 | Wt 192.1 lb

## 2012-01-19 DIAGNOSIS — Z9889 Other specified postprocedural states: Secondary | ICD-10-CM

## 2012-01-19 DIAGNOSIS — Z9049 Acquired absence of other specified parts of digestive tract: Secondary | ICD-10-CM

## 2012-01-19 MED ORDER — FLUCONAZOLE 100 MG PO TABS: 100.0000 mg | ORAL_TABLET | Freq: Every day | ORAL | Status: AC

## 2012-01-19 NOTE — Patient Instructions (Signed)
Thanks for your patience.  If you need further assistance after leaving the office, please call our office and speak with Tracy A.  [phone].  If you want to leave a message for Dr. Simmie Garin, please call his office phone at [phone]. 

## 2012-01-19 NOTE — Progress Notes (Signed)
Peggy French 71 y.o.  There is no height on file to calculate BMI.  Patient Active Problem List  Diagnosis  . Gout  . Diabetes mellitus  . Hypertension  . Hyperlipidemia  . Cancer of the ileocecal junction  . S/P right colectomy    Allergies  Allergen Reactions  . Aspirin Palpitations    Past Surgical History  Procedure Date  . Cholecystectomy   . Abdominal hysterectomy   . Colon surgery     Tumor  . Esophagogastroduodenoscopy 06/16/2011    Procedure: ESOPHAGOGASTRODUODENOSCOPY (EGD);  Surgeon: Malissa Hippo, MD;  [facility];  Service: Endoscopy;  Laterality: N/A;  2:15  . Colonoscopy 11/26/2011    Procedure: COLONOSCOPY;  Surgeon: Malissa Hippo, MD;  [facility];  Service: Endoscopy;  Laterality: N/A;  2:15  . Cardiac catheterization 2001,2012  . Appendectomy    HILL,GERALD K, MD No diagnosis found.  Wound is about 1 cm deep.  No erythema.  Slowly closing in.  Matt B. Daphine Deutscher, MD, [facility] [phone] beeper [phone]  01/19/2012 5:36 PM  Having a yeast infection after antibiotics.  Will give 2 days Diflucan 100 mg

## 2012-01-26 NOTE — Telephone Encounter (Signed)
Erroneous encounter

## 2012-02-07 ENCOUNTER — Telehealth (INDEPENDENT_AMBULATORY_CARE_PROVIDER_SITE_OTHER): Payer: Self-pay

## 2012-02-07 NOTE — Telephone Encounter (Signed)
Patient called wondering why she never got a call regarding a referral appointment. I looked in chart and could not see anything saying she was being referred but guessed it may be to med oncology. Will you please check with Daphine Deutscher to see what appointment if any this patient needs.

## 2012-02-09 ENCOUNTER — Telehealth (INDEPENDENT_AMBULATORY_CARE_PROVIDER_SITE_OTHER): Payer: Self-pay | Admitting: General Surgery

## 2012-02-09 ENCOUNTER — Other Ambulatory Visit (INDEPENDENT_AMBULATORY_CARE_PROVIDER_SITE_OTHER): Payer: Self-pay | Admitting: General Surgery

## 2012-02-09 DIAGNOSIS — C182 Malignant neoplasm of ascending colon: Secondary | ICD-10-CM

## 2012-02-09 NOTE — Telephone Encounter (Signed)
Called patient to advise referral was made to oncology. Advised they will contact her with an appointment date and time. Patient agreed.

## 2012-02-11 ENCOUNTER — Ambulatory Visit (INDEPENDENT_AMBULATORY_CARE_PROVIDER_SITE_OTHER): Payer: Medicare Other | Admitting: Surgery

## 2012-02-11 ENCOUNTER — Encounter (INDEPENDENT_AMBULATORY_CARE_PROVIDER_SITE_OTHER): Payer: Self-pay | Admitting: Surgery

## 2012-02-11 VITALS — BP 138/88 | HR 80 | Temp 98.2°F | Resp 18 | Ht 63.5 in | Wt 197.4 lb

## 2012-02-11 DIAGNOSIS — Z09 Encounter for follow-up examination after completed treatment for conditions other than malignant neoplasm: Secondary | ICD-10-CM

## 2012-02-11 NOTE — Progress Notes (Signed)
Subjective:     Patient ID: Peggy French, female   DOB: 08/20/1940, 71 y.o.   MRN: [id number]  HPI This is a patient of Dr. Ermalene Searing who had a right partial colectomy on June 19. She is concerned today about some increasing discomfort of her incision. She denies nausea or vomiting. She is now currently pain-free although she did fill a small bulge and pull. She is moving her bowels well.  Review of Systems     Objective:   Physical Exam On exam, the incision is healed well. There are no open areas. With her both lying and standing, I could not demonstrate a hernia in the area was nontender and her abdomen was benign    Assessment:     Patient stable postop    Plan:     I reassured her. She will resume her normal activity. She will keep her on Dr. Daphine Deutscher later this month

## 2012-02-14 ENCOUNTER — Ambulatory Visit: Payer: Medicare Other

## 2012-02-14 ENCOUNTER — Other Ambulatory Visit: Payer: Self-pay | Admitting: *Deleted

## 2012-02-14 ENCOUNTER — Telehealth: Payer: Self-pay | Admitting: *Deleted

## 2012-02-14 ENCOUNTER — Encounter: Payer: Self-pay | Admitting: Oncology

## 2012-02-14 ENCOUNTER — Telehealth (INDEPENDENT_AMBULATORY_CARE_PROVIDER_SITE_OTHER): Payer: Self-pay

## 2012-02-14 ENCOUNTER — Ambulatory Visit (HOSPITAL_BASED_OUTPATIENT_CLINIC_OR_DEPARTMENT_OTHER): Payer: Medicare Other | Admitting: Oncology

## 2012-02-14 VITALS — BP 125/74 | HR 79 | Temp 97.8°F | Resp 20 | Ht 63.5 in | Wt 197.2 lb

## 2012-02-14 DIAGNOSIS — C189 Malignant neoplasm of colon, unspecified: Secondary | ICD-10-CM

## 2012-02-14 DIAGNOSIS — C182 Malignant neoplasm of ascending colon: Secondary | ICD-10-CM

## 2012-02-14 DIAGNOSIS — C180 Malignant neoplasm of cecum: Secondary | ICD-10-CM | POA: Diagnosis not present

## 2012-02-14 DIAGNOSIS — C779 Secondary and unspecified malignant neoplasm of lymph node, unspecified: Secondary | ICD-10-CM

## 2012-02-14 NOTE — Progress Notes (Signed)
Met with patient and husband.  Explained navigator role.  Resource information and contact phone numbers given to patient.  Referral made to dietician for diet education.  Patient was without questions and verbalized understanding.  Will continue to follow.

## 2012-02-14 NOTE — Telephone Encounter (Signed)
Attempted to contact patient on 02/11/12 without success.  Patient's answering service is unusual and did not allow this RN to leave a message to return call.  This was relayed to Dr. Ermalene Searing office, Lenise Herald.  Attempted to reach patient again today with success.  Dr. Truett Perna will see patient today at 4:15.  This information was left for Dr. Daphine Deutscher and Annabelle Harman as requested on the referral.

## 2012-02-14 NOTE — Progress Notes (Signed)
[facility] New Patient Consult   Referring MD: Korena Nass 71 y.o.  August 15, 1940    Reason for Referral: Colon cancer     HPI: She developed pain in the low abdomen and "dark "stool. She saw Dr. Karilyn Cota and underwent a colonoscopy on 11/26/2011 . A large circumferential friable mass was noted at the ileocecal valve. Multiple biopsies were obtained. 2 small polyps were noted at the splenic flexure. 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 Biopsy from the descending colon polyps revealed hyperplastic polyps and no adenomatous change or malignancy.  A CT of the abdomen and pelvis on may 24th 2013 found the liver, spleen, pancreas, and adrenal glands to be normal. Wall thickening was noted at the a sending colon and ileocecal valve measuring 5.1 cm in length. Mild colonic wall thickening extended to the distal aspect of the terminal ileum. Adjacent enlarged lymph node versus a metastatic tumor deposit were noted medial to the primary lesion. Additional abnormal soft tissue was seen in the pericolic fat posterior to the right colon. An enlarged mesenteric lymph node in the mid abdomen measured 1.7 x 1.3 cm.  She was referred to Dr. Daphine Deutscher and was taken to the operating room on 12/29/2011. A laparoscopic-assisted right hemicolectomy was performed. Tumor was noted to involve the cecum. A palpable node was noted at the duodenum and through the mesentery. This was removed.  The pathology [id number]) confirmed a moderately differentiated adenocarcinoma invading through the muscular propria into the pericolonic fatty tissue. Angiolymphatic invasion was noted. 4 of 11 pericolonic lymph nodes were positive for metastatic adenocarcinoma with extranodal extension. The resection margins were negative. 2 lymph nodes from the "root of the mesentery "were submitted and 1 contained metastatic adenocarcinoma. The primary tumor was located at the  proximal cecum.   The preoperative abdominal pain has resolved, but she continues to have pain in the right abdomen. The pain is worse with taking a deep breath.   Past Medical History  Diagnosis Date  . Gout   . Diabetes mellitus     x over 10 yrs  . Hyperlipidemia   . GERD (gastroesophageal reflux disease)   .  G4 P4    . Asthmatic bronchitis 2011  . H/O hiatal hernia   . Headache     hx migraines  . Arthritis   . Cancer-"colon cancer "removed via a colonoscope   1980   . Hypertension     LOV with EKG Dr Rennis Golden 4/13 on chart/ cath 9/12 epic  . Sleep apnea     does not have CPAP machine    Past Surgical History  Procedure Date  . Cholecystectomy   . Abdominal hysterectomy   . Colon surgery-right hemicolectomy   12/29/2011       . Esophagogastroduodenoscopy 06/16/2011    Procedure: ESOPHAGOGASTRODUODENOSCOPY (EGD);  Surgeon: Malissa Hippo, MD;  [facility];  Service: Endoscopy;  Laterality: N/A;  2:15  . Colonoscopy 11/26/2011    Procedure: COLONOSCOPY;  Surgeon: Malissa Hippo, MD;  [facility];  Service: Endoscopy;  Laterality: N/A;  2:15  . Cardiac catheterization 2001,2012  . Appendectomy     Family History  Problem Relation Age of Onset  . Cancer Maternal Aunt     throat,   No family history of colon cancer.  Current outpatient prescriptions:acetaminophen (TYLENOL) 500 MG tablet, Take 1,000 mg by mouth every 6 (six) hours as needed. For pain, Disp: , Rfl: ;  amLODipine (NORVASC) 5 MG tablet, Take 5 mg by mouth every morning. , Disp: , Rfl: ;  esomeprazole (NEXIUM) 40 MG capsule, Take 40 mg by mouth every evening., Disp: , Rfl: ;  fexofenadine (ALLEGRA) 180 MG tablet, Take 180 mg by mouth daily as needed. For Allergies, Disp: , Rfl:  gabapentin (NEURONTIN) 100 MG capsule, Take 100 mg by mouth at bedtime. , Disp: , Rfl: ;  hyoscyamine (LEVSIN, ANASPAZ) 0.125 MG tablet, Take 0.125 mg by mouth every 4 (four) hours as needed. For cramping, Disp: ,  Rfl: ;  linagliptin (TRADJENTA) 5 MG TABS tablet, Take 5 mg by mouth every morning. , Disp: , Rfl: ;  propranolol-hydrochlorothiazide (INDERIDE) 40-25 MG per tablet, Take 1 tablet by mouth every morning. , Disp: , Rfl:  ramipril (ALTACE) 5 MG capsule, Take 5 mg by mouth every morning. , Disp: , Rfl: ;  simvastatin (ZOCOR) 40 MG tablet, Take 40 mg by mouth at bedtime. , Disp: , Rfl:   Allergies:  Allergies  Allergen Reactions  . Aspirin Palpitations    Social History: She lives with her husband near [place]. She has been on disability since undergoing removal of a "colon tumor "in 1980. She does not use tobacco or alcohol. She denies risk factors for HIV and hepatitis the    ROS:   Positives include: Nausea and abdominal pain prior to surgery, dark stool and diarrhea prior to surgery , 20 pound weight loss prior to surgery, fever prior to surgery, occasional "dizziness "  A complete ROS was otherwise negative.  Physical Exam:  Blood pressure 125/74, pulse 79, temperature 97.8 F (36.6 C), temperature source Oral, resp. rate 20, height 5' 3.5" (1.613 m), weight 197 lb 3.2 oz (89.449 kg).  HEENT: Upper denture plate, multiple  Missing lower teeth, oropharynx without visible mass, neck without mass  Lungs:  clear bilaterally Cardiac:  regular rate and rhythm  Abdomen:  tender in the right mid abdomen, no hepatosplenomegaly, no mass, healed surgical incisions surgical incisions  Vascular: no leg edema  Lymph nodes:  no cervical, supraclavicular, axillary, or inguinal nodes Neurologic:  alert and oriented, the motor exam appears intact in the upper and lower Jevity Skin:  no rash    LAB:  CBC  Lab Results  Component Value Date   WBC 11.0* 12/30/2011   HGB 10.1* 12/30/2011   HCT 30.9* 12/30/2011   MCV 72.4* 12/30/2011   PLT 327 12/30/2011     CMP      Component Value Date/Time   NA 132* 12/30/2011 0350   K 3.6 12/30/2011 0350   CL 95* 12/30/2011 0350   CO2 26 12/30/2011 0350     GLUCOSE 203* 12/30/2011 0350   BUN 4* 12/30/2011 0350   CREATININE 0.63 12/30/2011 0350   CREATININE 0.91 12/02/2011 1159   CALCIUM 8.4 12/30/2011 0350   PROT 6.8 12/30/2011 0350   ALBUMIN 2.6* 12/30/2011 0350   AST 27 12/30/2011 0350   ALT 14 12/30/2011 0350   ALKPHOS 75 12/30/2011 0350   BILITOT 0.3 12/30/2011 0350   GFRNONAA 89* 12/30/2011 0350   GFRAA >90 12/30/2011 0350     Radiology:As per history of present illness, chest x-ray 12/20/2011-lungs appear clear    Assessment/Plan:    1. 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 Diabetes  3. Microcytic anemia  4. Remote history of a "colon tumor "removed endoscopically in 1980   Disposition:   She has been  diagnosed with stage III colon cancer. I discussed the diagnosis, prognosis, and adjuvant treatment options with Ms. Mogle and her husband. We discussed the specifics of the surgical pathology report. She understands the high risk of developing recurrent colon cancer over the next several years. This is based on the multiple lymph nodes involved with metastatic tumor. There was a palpable lymph node removed from the mesentery that contained metastatic tumor.  I discussed the benefit associated with adjuvant systemic chemotherapy in this setting. We discussed the benefit associated with 5-fluorouracil and the expected decrease in the relapse rate with the addition of oxaliplatin.  She agrees to adjuvant therapy. We discussed the FOLFOX and CAPOX regimens. She would like to be treated in [place] and lives a significant distance from the cancer Center. We decided to proceed with CAPOX chemotherapy. I reviewed the potential toxicities associated with Xeloda including a chance for mouth sores, diarrhea, skin hyperpigmentation, and the hand/foot syndrome. We discussed the potential for hematologic toxicity, nausea, and alopecia. We reviewed the various types of neuropathy  associated with oxaliplatin.  She is now almost 7 weeks out from the initial surgery. We will ask Dr. Daphine Deutscher to place a Port-A-Cath with the plan to begin CAPOX therapy on 02/24/2012. She will be scheduled for a staging CT the chest, chemotherapy class, and [name] this week. I will see her for an office visit on 02/24/2012. Ms. Cerro will begin ferrous sulfate.  Nicha Hemann 02/14/2012, 6:05 PM

## 2012-02-14 NOTE — Telephone Encounter (Signed)
Gina from Dr. Kalman Drape office called to let you and Dr. Daphine Deutscher know that this patient was worked in today to see Dr Truett Perna. Also Dr Truett Perna wanted to express concern as to why this patient was just now getting referred to oncology when surgery was back on June 19th.

## 2012-02-15 ENCOUNTER — Other Ambulatory Visit (INDEPENDENT_AMBULATORY_CARE_PROVIDER_SITE_OTHER): Payer: Self-pay | Admitting: General Surgery

## 2012-02-15 ENCOUNTER — Encounter: Payer: Self-pay | Admitting: Oncology

## 2012-02-15 NOTE — Progress Notes (Signed)
Faxed xeloda prescription to [facility] @ [phone] phone # [phone].

## 2012-02-16 ENCOUNTER — Telehealth: Payer: Self-pay | Admitting: Oncology

## 2012-02-16 NOTE — Telephone Encounter (Signed)
S/w pt today re appt for 8/8 ched, 8/12 lb/ct, and 8/15 BS/tx/BN. lmonvm for surgical schedulers @ [facility] re port placement prior to 8/15. Also pt may already be scheduled for port on 8/13.

## 2012-02-17 ENCOUNTER — Other Ambulatory Visit: Payer: Self-pay | Admitting: *Deleted

## 2012-02-17 ENCOUNTER — Encounter (HOSPITAL_COMMUNITY): Payer: Self-pay

## 2012-02-17 ENCOUNTER — Ambulatory Visit: Payer: Medicare Other

## 2012-02-17 ENCOUNTER — Encounter: Payer: Self-pay | Admitting: *Deleted

## 2012-02-17 ENCOUNTER — Telehealth (INDEPENDENT_AMBULATORY_CARE_PROVIDER_SITE_OTHER): Payer: Self-pay | Admitting: General Surgery

## 2012-02-17 ENCOUNTER — Encounter (HOSPITAL_COMMUNITY): Payer: Self-pay | Admitting: Pharmacy Technician

## 2012-02-17 ENCOUNTER — Encounter (HOSPITAL_COMMUNITY)
Admission: RE | Admit: 2012-02-17 | Discharge: 2012-02-17 | Disposition: A | Discharge status: Home / Self Care | Payer: Medicare Other | Source: Ambulatory Visit | Attending: Surgery | Admitting: Surgery

## 2012-02-17 ENCOUNTER — Telehealth: Payer: Self-pay | Admitting: Oncology

## 2012-02-17 DIAGNOSIS — Z79899 Other long term (current) drug therapy: Secondary | ICD-10-CM | POA: Diagnosis not present

## 2012-02-17 DIAGNOSIS — K219 Gastro-esophageal reflux disease without esophagitis: Secondary | ICD-10-CM | POA: Diagnosis not present

## 2012-02-17 DIAGNOSIS — Z01812 Encounter for preprocedural laboratory examination: Secondary | ICD-10-CM | POA: Diagnosis not present

## 2012-02-17 DIAGNOSIS — C182 Malignant neoplasm of ascending colon: Secondary | ICD-10-CM | POA: Diagnosis not present

## 2012-02-17 DIAGNOSIS — Z9089 Acquired absence of other organs: Secondary | ICD-10-CM | POA: Diagnosis not present

## 2012-02-17 DIAGNOSIS — E785 Hyperlipidemia, unspecified: Secondary | ICD-10-CM | POA: Diagnosis not present

## 2012-02-17 DIAGNOSIS — I10 Essential (primary) hypertension: Secondary | ICD-10-CM | POA: Diagnosis not present

## 2012-02-17 DIAGNOSIS — E119 Type 2 diabetes mellitus without complications: Secondary | ICD-10-CM | POA: Diagnosis not present

## 2012-02-17 LAB — BASIC METABOLIC PANEL
BUN: 8 mg/dL (ref 6–23)
Chloride: 99 mEq/L (ref 96–112)
Creatinine, Ser: 0.74 mg/dL (ref 0.50–1.10)
Glucose, Bld: 139 mg/dL — ABNORMAL HIGH (ref 70–99)
Potassium: 3 mEq/L — ABNORMAL LOW (ref 3.5–5.1)

## 2012-02-17 LAB — CBC
HCT: 29.3 % — ABNORMAL LOW (ref 36.0–46.0)
Hemoglobin: 8.9 g/dL — ABNORMAL LOW (ref 12.0–15.0)
MCV: 66.9 fL — ABNORMAL LOW (ref 78.0–100.0)
RDW: 16.5 % — ABNORMAL HIGH (ref 11.5–15.5)
WBC: 6.8 10*3/uL (ref 4.0–10.5)

## 2012-02-17 LAB — SURGICAL PCR SCREEN: Staphylococcus aureus: NEGATIVE

## 2012-02-17 MED ORDER — CAPECITABINE 500 MG PO TABS: 2000.0000 mg | ORAL_TABLET | Freq: Two times a day (BID) | ORAL | Status: DC

## 2012-02-17 MED ORDER — PROCHLORPERAZINE MALEATE 10 MG PO TABS: 10.0000 mg | ORAL_TABLET | Freq: Four times a day (QID) | ORAL | Status: DC | PRN

## 2012-02-17 MED ORDER — LIDOCAINE-PRILOCAINE 2.5-2.5 % EX CREA: TOPICAL_CREAM | CUTANEOUS | Status: DC | PRN

## 2012-02-17 NOTE — Telephone Encounter (Signed)
Diane @ [facility] lmonvm today confirming pt is on schedule for port placement w/Dr. Daphine Deutscher 8/13.

## 2012-02-17 NOTE — Telephone Encounter (Signed)
I SPOKE WITH DR. Luisa Hart RE PT'S LAB RESULTS POTASSIUM LEVEL AND HBG. HE SAID WE COULD CALL IN K-DUR 10MG  TAB TO TAKE ONCE A DAY UNTIL SURGERY AND TO EAT FOODS HIGH IN POTASSIUM, E.G. BANANAS. HE DID NOT ADVISE ANYTHING RE HBG. I LEFT A MESSAGE ON PT'S PHONE TO CALL OUR OFFICE IN AM RE INSTRUCTIONS AND NAME OF PHARMACY./GY

## 2012-02-17 NOTE — Patient Instructions (Addendum)
21 Peggy French  02/17/2012   Your procedure is scheduled on:  02/22/12  Tuesday  Surgery  1200-`1308  [facility] 0930      AM.  Call this number if you have problems the morning of surgery: [phone]     Or PST   [id number]  [facility]   Remember: DO NOT TAKE ANY BLOOD SUGAR MEDICINE MORNING OF SURGERY  Do not eat food  Or fluids :After Midnight. Monday NIGHT                                    EAT SNACK BEFORE BEDTIME Monday NIGHT OR BY MIDNIGHT     Take these medicines the morning of surgery with A SIP OF WATER: Amlodipine May take allegra, Oxycodone if needed  Do not wear jewelry, make-up or nail polish.  Do not wear lotions, powders, or perfumes. You may wear deodorant.  Do not shave 48 hours prior to surgery.  Do not bring valuables to the hospital.  Contacts, dentures or bridgework may not be worn into surgery.  Leave suitcase in the car. After surgery it may be brought to your room.  For patients admitted to the hospital, checkout time is 11:00 AM the day of discharge.   Patients discharged the day of surgery will not be allowed to drive home.  Name and phone number of your driver:      husband                                                                Special Instructions: CHG Shower Use Special Wash: 1/2 bottle night before surgery and 1/2 bottle morning of surgery. REGULAR SOAP FACE AND PRIVATES              LADIES- NO SHAVING 48 HOURS BEFORE USING BETASEPT SOAP.                   Please read over the following fact sheets that you were given: MRSA Information

## 2012-02-17 NOTE — Progress Notes (Signed)
Patient was given education on port-a-cath including reasons for having a PAC, signs and symptoms of infection to report to the physician, and incision site care. The port-a-cath model was used as a visual while teaching being done.   Teach back method was used.  Patient verbalized understanding and was without questions.

## 2012-02-17 NOTE — Progress Notes (Signed)
Germaine from  [facility] also notified of abnormal HGB and results from 6/13 as well

## 2012-02-17 NOTE — Telephone Encounter (Signed)
LESLIE CALLED TO REPORT PRE-OP POTASSIUM LEVEL OF 3. SHE WANTED TO REPOT THIS TO DR. MARTIN/ PT'S SURGERY IS 02-22-12/GY

## 2012-02-17 NOTE — Progress Notes (Signed)
ABNORMAL   POTASSIUM RESULT CALLED TO GERMAINE AT [id number]- she stated that Dr Daphine Deutscher is out of town-  States she will forward this to [facility], Production designer, theatre/television/film who will reroute  this to another doctor for review

## 2012-02-18 ENCOUNTER — Telehealth (INDEPENDENT_AMBULATORY_CARE_PROVIDER_SITE_OTHER): Payer: Self-pay | Admitting: General Surgery

## 2012-02-18 ENCOUNTER — Encounter (INDEPENDENT_AMBULATORY_CARE_PROVIDER_SITE_OTHER): Payer: Self-pay

## 2012-02-18 ENCOUNTER — Encounter (HOSPITAL_COMMUNITY): Payer: Self-pay

## 2012-02-18 NOTE — Telephone Encounter (Signed)
Pt returned our call from yesterday; informed she needs to take KDur 10 mEq QD until her surgery, for K+ 3.0.  She understands and will do so.  Med called to [facility]:  [phone].

## 2012-02-18 NOTE — Pre-Procedure Instructions (Signed)
LOV note Dr Rennis Golden on chart 4/13

## 2012-02-21 ENCOUNTER — Ambulatory Visit (HOSPITAL_COMMUNITY)
Admission: RE | Admit: 2012-02-21 | Discharge: 2012-02-21 | Disposition: A | Discharge status: Home / Self Care | Payer: Medicare Other | Source: Ambulatory Visit | Attending: Oncology | Admitting: Oncology

## 2012-02-21 ENCOUNTER — Other Ambulatory Visit: Payer: Self-pay | Admitting: *Deleted

## 2012-02-21 ENCOUNTER — Other Ambulatory Visit (HOSPITAL_BASED_OUTPATIENT_CLINIC_OR_DEPARTMENT_OTHER): Payer: Medicare Other | Admitting: Lab

## 2012-02-21 DIAGNOSIS — C182 Malignant neoplasm of ascending colon: Secondary | ICD-10-CM

## 2012-02-21 DIAGNOSIS — C189 Malignant neoplasm of colon, unspecified: Secondary | ICD-10-CM | POA: Insufficient documentation

## 2012-02-21 DIAGNOSIS — R911 Solitary pulmonary nodule: Secondary | ICD-10-CM | POA: Insufficient documentation

## 2012-02-21 DIAGNOSIS — J984 Other disorders of lung: Secondary | ICD-10-CM | POA: Insufficient documentation

## 2012-02-21 DIAGNOSIS — I2510 Atherosclerotic heart disease of native coronary artery without angina pectoris: Secondary | ICD-10-CM | POA: Insufficient documentation

## 2012-02-21 LAB — COMPREHENSIVE METABOLIC PANEL
AST: 12 U/L (ref 0–37)
BUN: 8 mg/dL (ref 6–23)
Calcium: 9.1 mg/dL (ref 8.4–10.5)
Chloride: 96 mEq/L (ref 96–112)
Creatinine, Ser: 0.78 mg/dL (ref 0.50–1.10)
Total Bilirubin: 0.2 mg/dL — ABNORMAL LOW (ref 0.3–1.2)

## 2012-02-21 LAB — CBC WITH DIFFERENTIAL/PLATELET
BASO%: 0.8 % (ref 0.0–2.0)
Basophils Absolute: 0.1 10*3/uL (ref 0.0–0.1)
EOS%: 4.6 % (ref 0.0–7.0)
HCT: 29.5 % — ABNORMAL LOW (ref 34.8–46.6)
HGB: 9.3 g/dL — ABNORMAL LOW (ref 11.6–15.9)
LYMPH%: 22.7 % (ref 14.0–49.7)
MCH: 21.2 pg — ABNORMAL LOW (ref 25.1–34.0)
MCHC: 31.4 g/dL — ABNORMAL LOW (ref 31.5–36.0)
MCV: 67.4 fL — ABNORMAL LOW (ref 79.5–101.0)
MONO%: 7.5 % (ref 0.0–14.0)
NEUT%: 64.4 % (ref 38.4–76.8)
Platelets: 355 10*3/uL (ref 145–400)

## 2012-02-21 NOTE — Telephone Encounter (Signed)
THIS REFILL REQUEST FOR XELODA WAS PLACED ON DR.SHERRILL'S NURSE'S DESK. SUSAN COWARD IS THERE TODAY.

## 2012-02-22 ENCOUNTER — Ambulatory Visit (HOSPITAL_COMMUNITY): Payer: Medicare Other

## 2012-02-22 ENCOUNTER — Encounter (HOSPITAL_COMMUNITY): Payer: Self-pay | Admitting: Anesthesiology

## 2012-02-22 ENCOUNTER — Encounter (HOSPITAL_COMMUNITY)
Admission: RE | Disposition: A | Discharge status: Home / Self Care | Payer: Self-pay | Source: Ambulatory Visit | Attending: Surgery

## 2012-02-22 ENCOUNTER — Ambulatory Visit (HOSPITAL_COMMUNITY)
Admission: RE | Admit: 2012-02-22 | Discharge: 2012-02-22 | Disposition: A | Discharge status: Home / Self Care | Payer: Medicare Other | Source: Ambulatory Visit | Attending: Surgery | Admitting: Surgery

## 2012-02-22 ENCOUNTER — Encounter (HOSPITAL_COMMUNITY): Payer: Self-pay | Admitting: *Deleted

## 2012-02-22 ENCOUNTER — Ambulatory Visit (HOSPITAL_COMMUNITY): Payer: Medicare Other | Admitting: Anesthesiology

## 2012-02-22 DIAGNOSIS — K219 Gastro-esophageal reflux disease without esophagitis: Secondary | ICD-10-CM | POA: Insufficient documentation

## 2012-02-22 DIAGNOSIS — Z452 Encounter for adjustment and management of vascular access device: Secondary | ICD-10-CM | POA: Diagnosis not present

## 2012-02-22 DIAGNOSIS — I10 Essential (primary) hypertension: Secondary | ICD-10-CM | POA: Diagnosis not present

## 2012-02-22 DIAGNOSIS — Z79899 Other long term (current) drug therapy: Secondary | ICD-10-CM | POA: Insufficient documentation

## 2012-02-22 DIAGNOSIS — Z01812 Encounter for preprocedural laboratory examination: Secondary | ICD-10-CM | POA: Insufficient documentation

## 2012-02-22 DIAGNOSIS — E119 Type 2 diabetes mellitus without complications: Secondary | ICD-10-CM | POA: Diagnosis not present

## 2012-02-22 DIAGNOSIS — E785 Hyperlipidemia, unspecified: Secondary | ICD-10-CM | POA: Insufficient documentation

## 2012-02-22 DIAGNOSIS — C182 Malignant neoplasm of ascending colon: Secondary | ICD-10-CM | POA: Diagnosis not present

## 2012-02-22 DIAGNOSIS — C189 Malignant neoplasm of colon, unspecified: Secondary | ICD-10-CM | POA: Diagnosis not present

## 2012-02-22 DIAGNOSIS — Z9089 Acquired absence of other organs: Secondary | ICD-10-CM | POA: Insufficient documentation

## 2012-02-22 HISTORY — PX: PORTACATH PLACEMENT: SHX2246

## 2012-02-22 SURGERY — INSERTION, TUNNELED CENTRAL VENOUS DEVICE, WITH PORT
Anesthesia: General | Site: Chest | Wound class: Clean

## 2012-02-22 MED ORDER — HEPARIN SOD (PORK) LOCK FLUSH 100 UNIT/ML IV SOLN
INTRAVENOUS | Status: AC
  Filled 2012-02-22: qty 5

## 2012-02-22 MED ORDER — FENTANYL CITRATE 0.05 MG/ML IJ SOLN
INTRAMUSCULAR | Status: DC | PRN
  Administered 2012-02-22: 100 ug via INTRAVENOUS

## 2012-02-22 MED ORDER — BUPIVACAINE LIPOSOME 1.3 % IJ SUSP
20.0000 mL | Freq: Once | INTRAMUSCULAR | Status: DC
  Filled 2012-02-22: qty 20

## 2012-02-22 MED ORDER — ACETAMINOPHEN 325 MG PO TABS: 650.0000 mg | ORAL_TABLET | ORAL | Status: DC | PRN

## 2012-02-22 MED ORDER — ONDANSETRON HCL 4 MG/2ML IJ SOLN
INTRAMUSCULAR | Status: DC | PRN
  Administered 2012-02-22: 4 mg via INTRAVENOUS

## 2012-02-22 MED ORDER — PROMETHAZINE HCL 25 MG/ML IJ SOLN: 6.2500 mg | INTRAMUSCULAR | Status: DC | PRN

## 2012-02-22 MED ORDER — KCL IN DEXTROSE-NACL 20-5-0.45 MEQ/L-%-% IV SOLN: INTRAVENOUS | Status: DC

## 2012-02-22 MED ORDER — LACTATED RINGERS IV SOLN
INTRAVENOUS | Status: DC
  Administered 2012-02-22: 1000 mL via INTRAVENOUS

## 2012-02-22 MED ORDER — CAPECITABINE 500 MG PO TABS: 2000.0000 mg | ORAL_TABLET | Freq: Two times a day (BID) | ORAL | Status: DC

## 2012-02-22 MED ORDER — MIDAZOLAM HCL 5 MG/5ML IJ SOLN
INTRAMUSCULAR | Status: DC | PRN
  Administered 2012-02-22: 2 mg via INTRAVENOUS

## 2012-02-22 MED ORDER — HEPARIN SOD (PORK) LOCK FLUSH 100 UNIT/ML IV SOLN
INTRAVENOUS | Status: DC | PRN
  Administered 2012-02-22: 500 [IU]

## 2012-02-22 MED ORDER — FENTANYL CITRATE 0.05 MG/ML IJ SOLN: 25.0000 ug | INTRAMUSCULAR | Status: DC | PRN

## 2012-02-22 MED ORDER — PROPOFOL 10 MG/ML IV EMUL
INTRAVENOUS | Status: DC | PRN
  Administered 2012-02-22: 140 ug/kg/min via INTRAVENOUS

## 2012-02-22 MED ORDER — PROPRANOLOL HCL 40 MG PO TABS
40.0000 mg | ORAL_TABLET | Freq: Once | ORAL | Status: AC
  Administered 2012-02-22: 40 mg via ORAL
  Filled 2012-02-22: qty 1

## 2012-02-22 MED ORDER — OXYCODONE HCL 5 MG PO TABS: 5.0000 mg | ORAL_TABLET | ORAL | Status: DC | PRN

## 2012-02-22 MED ORDER — SODIUM CHLORIDE 0.9 % IJ SOLN: 3.0000 mL | INTRAMUSCULAR | Status: DC | PRN

## 2012-02-22 MED ORDER — BUPIVACAINE-EPINEPHRINE 0.25% -1:200000 IJ SOLN
INTRAMUSCULAR | Status: DC | PRN
  Administered 2012-02-22: 15 mL

## 2012-02-22 MED ORDER — ACETAMINOPHEN 650 MG RE SUPP: 650.0000 mg | RECTAL | Status: DC | PRN

## 2012-02-22 MED ORDER — SODIUM CHLORIDE 0.9 % IV SOLN: 250.0000 mL | INTRAVENOUS | Status: DC | PRN

## 2012-02-22 MED ORDER — ACETAMINOPHEN 650 MG RE SUPP
650.0000 mg | RECTAL | Status: DC | PRN
  Filled 2012-02-22: qty 1

## 2012-02-22 MED ORDER — ONDANSETRON HCL 4 MG/2ML IJ SOLN: 4.0000 mg | Freq: Four times a day (QID) | INTRAMUSCULAR | Status: DC | PRN

## 2012-02-22 MED ORDER — SODIUM CHLORIDE 0.9 % IR SOLN
Freq: Once | Status: AC
  Administered 2012-02-22: 13:00:00
  Filled 2012-02-22: qty 1.2

## 2012-02-22 MED ORDER — SODIUM CHLORIDE 0.9 % IJ SOLN: 3.0000 mL | Freq: Two times a day (BID) | INTRAMUSCULAR | Status: DC

## 2012-02-22 MED ORDER — CEFAZOLIN SODIUM-DEXTROSE 2-3 GM-% IV SOLR
2.0000 g | INTRAVENOUS | Status: AC
  Administered 2012-02-22: 2 g via INTRAVENOUS

## 2012-02-22 SURGICAL SUPPLY — 39 items
APL SKNCLS STERI-STRIP NONHPOA (GAUZE/BANDAGES/DRESSINGS) ×1
BAG DECANTER FOR FLEXI CONT (MISCELLANEOUS) ×2 IMPLANT
BENZOIN TINCTURE PRP APPL 2/3 (GAUZE/BANDAGES/DRESSINGS) ×2 IMPLANT
BLADE HEX COATED 2.75 (ELECTRODE) ×2 IMPLANT
BLADE SURG 15 STRL LF DISP TIS (BLADE) ×1 IMPLANT
BLADE SURG 15 STRL SS (BLADE) ×2
CLOTH BEACON ORANGE TIMEOUT ST (SAFETY) ×2 IMPLANT
DECANTER SPIKE VIAL GLASS SM (MISCELLANEOUS) ×2 IMPLANT
DRAPE C-ARM 42X72 X-RAY (DRAPES) ×2 IMPLANT
DRAPE PED LAPAROTOMY (DRAPES) ×2 IMPLANT
ELECT REM PT RETURN 9FT ADLT (ELECTROSURGICAL) ×2
ELECTRODE REM PT RTRN 9FT ADLT (ELECTROSURGICAL) ×1 IMPLANT
GAUZE SPONGE 2X2 8PLY STRL LF (GAUZE/BANDAGES/DRESSINGS) ×1 IMPLANT
GAUZE SPONGE 4X4 16PLY XRAY LF (GAUZE/BANDAGES/DRESSINGS) ×2 IMPLANT
GLOVE BIOGEL M 8.0 STRL (GLOVE) ×2 IMPLANT
GLOVE BIOGEL PI IND STRL 7.0 (GLOVE) ×1 IMPLANT
GLOVE BIOGEL PI INDICATOR 7.0 (GLOVE) ×1
GOWN STRL NON-REIN LRG LVL3 (GOWN DISPOSABLE) ×4 IMPLANT
GOWN STRL REIN XL XLG (GOWN DISPOSABLE) ×4 IMPLANT
KIT BARDPORT ISP (Port) ×2 IMPLANT
KIT BARDPORT ISP 9.6FR (PORTABLE EQUIPMENT SUPPLIES) IMPLANT
KIT BASIN OR (CUSTOM PROCEDURE TRAY) ×2 IMPLANT
KIT POWER CATH 8FR (Catheter) ×2 IMPLANT
NEEDLE HYPO 22GX1.5 SAFETY (NEEDLE) ×2 IMPLANT
NEEDLE HYPO 25X1 1.5 SAFETY (NEEDLE) ×2 IMPLANT
NS IRRIG 1000ML POUR BTL (IV SOLUTION) ×2 IMPLANT
PACK BASIC VI WITH GOWN DISP (CUSTOM PROCEDURE TRAY) ×2 IMPLANT
PEN SKIN MARKING BROAD (MISCELLANEOUS) ×2 IMPLANT
PENCIL BUTTON HOLSTER BLD 10FT (ELECTRODE) ×2 IMPLANT
SPONGE GAUZE 2X2 STER 10/PKG (GAUZE/BANDAGES/DRESSINGS) ×1
SPONGE GAUZE 4X4 12PLY (GAUZE/BANDAGES/DRESSINGS) IMPLANT
STRIP CLOSURE SKIN 1/2X4 (GAUZE/BANDAGES/DRESSINGS) ×2 IMPLANT
SUT PROLENE 2 0 CT2 30 (SUTURE) ×2 IMPLANT
SUT PROLENE 2 0 SH DA (SUTURE) IMPLANT
SUT SILK 1 TIES 10/18 (SUTURE) IMPLANT
SUT VIC AB 4-0 SH 18 (SUTURE) ×2 IMPLANT
SYR BULB IRRIGATION 50ML (SYRINGE) IMPLANT
SYR CONTROL 10ML LL (SYRINGE) ×2 IMPLANT
SYRINGE 10CC LL (SYRINGE) ×2 IMPLANT

## 2012-02-22 NOTE — H&P (Addendum)
Chief Complaint: Right colon cancer  History of Present Illness: Peggy French is an 71 y.o. female from [place] who was sent down by Dr. Karilyn Cota with a new ileocecal cancer.  Impression:  Normal terminal ileum.  Large friable mass and ileocecal valve to for histology.  Two small polyps ablated via cold biopsy from splenic flexure.  Mild sigmoid colon diverticulosis.  External hemorrhoids.  She describes a transrectal tumor resected by surgeons in [place] some years ago.  Her right hemicolectomy had positive nodes and she will receive adjuvant chemotherapy per Dr. Truett Perna Past Medical History   Diagnosis  Date   .  Gout    .  Diabetes mellitus      x over 10 yrs   .  Hypertension    .  Hyperlipidemia    .  GERD (gastroesophageal reflux disease)    .  Chest pain     Past Surgical History   Procedure  Date   .  Cholecystectomy    .  Abdominal hysterectomy    .  Colon surgery      Tumor   .  Esophagogastroduodenoscopy  06/16/2011     Procedure: ESOPHAGOGASTRODUODENOSCOPY (EGD); Surgeon: Malissa Hippo, MD; [facility]; Service: Endoscopy; Laterality: N/A; 2:15   .  Colonoscopy  11/26/2011     Procedure: COLONOSCOPY; Surgeon: Malissa Hippo, MD; [facility]; Service: Endoscopy; Laterality: N/A; 2:15    Current Outpatient Prescriptions   Medication  Sig  Dispense  Refill   .  acetaminophen (TYLENOL) 500 MG tablet  Take 1,000 mg by mouth every 6 (six) hours as needed. For pain     .  albuterol (PROVENTIL HFA;VENTOLIN HFA) 108 (90 BASE) MCG/ACT inhaler  Inhale 2 puffs into the lungs every 6 (six) hours as needed. For shortness of breath     .  allopurinol (ZYLOPRIM) 100 MG tablet  Take 100 mg by mouth every morning.     Marland Kitchen  amLODipine (NORVASC) 5 MG tablet  Take 5 mg by mouth every morning.     .  colchicine 0.6 MG tablet  Take 0.6 mg by mouth daily as needed. For severe gout flares     .  diphenoxylate-atropine (LOMOTIL) 2.5-0.025 MG per tablet  Take 1  tablet by mouth every 6 (six) hours as needed. For diarrhea     .  esomeprazole (NEXIUM) 40 MG capsule  Take 40 mg by mouth every evening.     .  fexofenadine (ALLEGRA) 180 MG tablet  Take 180 mg by mouth daily as needed. For Allergies     .  gabapentin (NEURONTIN) 100 MG capsule  Take 100 mg by mouth at bedtime.     .  hyoscyamine (LEVSIN, ANASPAZ) 0.125 MG tablet  Take 0.125 mg by mouth every 4 (four) hours as needed. For cramping     .  linagliptin (TRADJENTA) 5 MG TABS tablet  Take 5 mg by mouth every morning.     .  propranolol-hydrochlorothiazide (INDERIDE) 40-25 MG per tablet  Take 1 tablet by mouth every morning.     .  ramipril (ALTACE) 5 MG capsule  Take 5 mg by mouth every morning.     .  simvastatin (ZOCOR) 40 MG tablet  Take 40 mg by mouth at bedtime.     Aspirin  History reviewed. No pertinent family history.  Social History: reports that she has never smoked. She does not have any smokeless tobacco history on file. She reports  that she does not drink alcohol or use illicit drugs.  REVIEW OF SYSTEMS - PERTINENT POSITIVES ONLY:  Physical Exam:  Blood pressure 146/80, pulse 72, temperature 96 F (35.6 C), temperature source Temporal, resp. rate 16, height 5' 5.5" (1.664 m), weight 200 lb 6.4 oz (90.901 kg).  Body mass index is 32.84 kg/(m^2).  Gen: WDWN AAF NAD  Neurological: Alert and oriented to person, place, and time. Motor and sensory function is grossly intact  Head: Normocephalic and atraumatic.  Eyes: Conjunctivae are normal. Pupils are equal, round, and reactive to light. No scleral icterus.  Neck: Normal range of motion. Neck supple. No tracheal deviation or thyromegaly present.  Cardiovascular: SR without murmurs or gallops. No carotid bruits  Respiratory: Effort normal. No respiratory distress. No chest wall tenderness. Breath sounds normal. No wheezes, rales or rhonchi.  Abdomen: Nontender. Prior lap chole  And lap assisted right hemicolectomy GU:  Musculoskeletal:  Normal range of motion. Extremities are nontender. No cyanosis, edema or clubbing noted Lymphadenopathy: No cervical, preauricular, postauricular or axillary adenopathy is present Skin: Skin is warm and dry. No rash noted. No diaphoresis. No erythema. No pallor. Pscyh: Normal mood and affect. Behavior is normal. Judgment and thought content normal.  LABORATORY RESULTS:  No results found for this or any previous visit (from the past 48 hour(s)).  RADIOLOGY RESULTS:  No results found.  Problem List:  Patient Active Problem List   Diagnoses   .  Gout   .  Diabetes mellitus   .  Hypertension   .  Hyperlipidemia   Assessment & Plan:  Need IV access for chemotherapy.  Plan subclavian portacath.    There has been no change in the patient's past medical history or physical exam in the past 24 hours to the best of my knowledge. I examined the patient in the holding area and have made any changes to the history and physical exam report that is included above.   Expectations and outcome results have been discussed with the patient to include risks and benefits.  All questions have been answered and we will proceed with previously discussed procedure noted and signed in the consent form in the patient's record.    Lavita Pontius BMD FACS 12:09 PM  02/22/2012

## 2012-02-22 NOTE — Anesthesia Preprocedure Evaluation (Signed)
Anesthesia Evaluation  Patient identified by MRN, date of birth, ID band Patient awake    Reviewed: Allergy & Precautions, H&P , NPO status , Patient's Chart, lab work & pertinent test results  Airway Mallampati: II TM Distance: >3 FB Neck ROM: Full    Dental No notable dental hx. (+) Edentulous Upper   Pulmonary sleep apnea ,  breath sounds clear to auscultation  Pulmonary exam normal       Cardiovascular hypertension, Pt. on medications Rhythm:Regular Rate:Normal     Neuro/Psych negative neurological ROS  negative psych ROS   GI/Hepatic Neg liver ROS, GERD-  Medicated,  Endo/Other  Type obesity  Renal/GU negative Renal ROS  negative genitourinary   Musculoskeletal negative musculoskeletal ROS (+)   Abdominal   Peds negative pediatric ROS (+)  Hematology negative hematology ROS (+)   Anesthesia Other Findings   Reproductive/Obstetrics negative OB ROS                           Anesthesia Physical Anesthesia Plan  ASA: III  Anesthesia Plan: General   Post-op Pain Management:    Induction: Intravenous  Airway Management Planned: LMA  Additional Equipment:   Intra-op Plan:   Post-operative Plan:   Informed Consent: I have reviewed the patients History and Physical, chart, labs and discussed the procedure including the risks, benefits and alternatives for the proposed anesthesia with the patient or authorized representative who has indicated his/her understanding and acceptance.   Dental advisory given  Plan Discussed with: CRNA and Surgeon  Anesthesia Plan Comments:         Anesthesia Quick Evaluation

## 2012-02-22 NOTE — Anesthesia Postprocedure Evaluation (Signed)
Anesthesia Post Note  Patient: Peggy French  Procedure(s) Performed: Procedure(s) (LRB): INSERTION PORT-A-CATH (N/A)  Anesthesia type: MAC  Patient location: PACU  Post pain: Pain level controlled  Post assessment: Post-op Vital signs reviewed  Last Vitals: BP 135/65  Pulse 80  Temp 36.7 C (Oral)  Resp 19  SpO2 95%  Post vital signs: Reviewed  Level of consciousness: awake  Complications: No apparent anesthesia complications

## 2012-02-22 NOTE — Addendum Note (Signed)
Addended by: Arvilla Meres on: 02/22/2012 09:15 AM   Modules accepted: Orders

## 2012-02-22 NOTE — Preoperative (Signed)
Beta Blockers   Reason not to administer Beta Blockers:Not Applicable 

## 2012-02-22 NOTE — Op Note (Signed)
Surgeon: Wenda Low, MD, FACS  Asst:  none  Anes:  Local MAC  Procedure: Insertion of left subclavian Powerport  Diagnosis: Cancer of the right colon for chemotherapy  Complications: None apparent  EBL:   minimal cc  Description of Procedure:  The patient was taken to OR 6 and placed in Trendelenburg. Her breast and neck were prepped with PCMX. Timeout was performed. Access to the left subclavian vein was achieved on the second pass with the power port access needle and the wire was passed easily. Fluoroscopy was used to confirm the position of the wire in the superior vena cava. The obturator and peel-away sheath were then passed over the wire and again the C-arm was used to confirm location. The obturator and wire were removed and the power port catheter was passed to about 25 centimeters. It previously been tunneled from the port which I created under local. He had been flushed with heparin and it was after passage I got return of of blood that was consistent with venous blood as identified initial cannulation.  The C-arm was used to estimate the appropriate length and I left it in the cavoatrial junction and the superior vena cava. It was cut and connected to the power port was then tucked into a small pocket subcutaneously and held in place with a 2-0 Prolene. 4-0 Vicryl was used to close all the incisions along with Dermabond. A right angle Demetrios Isaacs was accessed through the skin which would stay in can be used for chemotherapy. This was flushed with concentrated aqueous heparin and it withdrew and flushed appropriately. Patient tolerated procedure well was taken recovery room. An x-ray has been ordered.  Matt B. Daphine Deutscher, MD, [facility], [place] [phone]

## 2012-02-22 NOTE — Progress Notes (Signed)
Pt has been taking Potassium for the last 4 days

## 2012-02-22 NOTE — Addendum Note (Signed)
Addended by: Arvilla Meres on: 02/22/2012 09:13 AM   Modules accepted: Orders

## 2012-02-22 NOTE — Transfer of Care (Signed)
Immediate Anesthesia Transfer of Care Note  Patient: Peggy French  Procedure(s) Performed: Procedure(s) (LRB): INSERTION PORT-A-CATH (N/A)  Patient Location: PACU  Anesthesia Type: MAC  Level of Consciousness: awake, alert , oriented and patient cooperative  Airway & Oxygen Therapy: Patient Spontanous Breathing  Post-op Assessment: Report given to PACU RN and Post -op Vital signs reviewed and stable  Post vital signs: Reviewed and stable  Complications: No apparent anesthesia complications

## 2012-02-23 ENCOUNTER — Encounter: Payer: Self-pay | Admitting: Oncology

## 2012-02-23 ENCOUNTER — Encounter (HOSPITAL_COMMUNITY): Payer: Self-pay | Admitting: Surgery

## 2012-02-23 ENCOUNTER — Other Ambulatory Visit: Payer: Self-pay

## 2012-02-23 NOTE — Telephone Encounter (Signed)
Received call from pt stating that she called her pharmacy and was told that they do not have xeloda prescription that she is to start tomorrow.  Called [facility] and spoke with Nadine Counts, who states they have left a message for pt, and pt should call to confirm copay amount, and prescription will be sent to her tomorrow.  Called pt back and informed her of this, gave her the number to [facility] located on fax, and she verbalizes understanding.

## 2012-02-23 NOTE — Progress Notes (Signed)
Called [facility] @ [phone] about patient's xeloda.  They will contact the patient to set up shipment.  I called Ms. Debose who informed me that she already spoke to [facility], paid her copay, and should receive her shipment.

## 2012-02-24 ENCOUNTER — Telehealth (INDEPENDENT_AMBULATORY_CARE_PROVIDER_SITE_OTHER): Payer: Self-pay

## 2012-02-24 ENCOUNTER — Telehealth: Payer: Self-pay | Admitting: *Deleted

## 2012-02-24 ENCOUNTER — Ambulatory Visit: Payer: Medicare Other | Admitting: Nutrition

## 2012-02-24 ENCOUNTER — Ambulatory Visit (HOSPITAL_BASED_OUTPATIENT_CLINIC_OR_DEPARTMENT_OTHER): Payer: Medicare Other | Admitting: Oncology

## 2012-02-24 ENCOUNTER — Other Ambulatory Visit (HOSPITAL_BASED_OUTPATIENT_CLINIC_OR_DEPARTMENT_OTHER): Payer: Medicare Other | Admitting: Lab

## 2012-02-24 ENCOUNTER — Other Ambulatory Visit: Payer: Self-pay | Admitting: *Deleted

## 2012-02-24 ENCOUNTER — Ambulatory Visit (HOSPITAL_BASED_OUTPATIENT_CLINIC_OR_DEPARTMENT_OTHER): Payer: Medicare Other

## 2012-02-24 ENCOUNTER — Ambulatory Visit (HOSPITAL_COMMUNITY)
Admission: RE | Admit: 2012-02-24 | Discharge: 2012-02-24 | Disposition: A | Discharge status: Home / Self Care | Payer: Medicare Other | Source: Ambulatory Visit | Attending: Oncology | Admitting: Oncology

## 2012-02-24 VITALS — BP 138/75 | HR 87 | Temp 98.4°F | Resp 18 | Ht 63.5 in | Wt 199.5 lb

## 2012-02-24 DIAGNOSIS — Z452 Encounter for adjustment and management of vascular access device: Secondary | ICD-10-CM | POA: Diagnosis not present

## 2012-02-24 DIAGNOSIS — C182 Malignant neoplasm of ascending colon: Secondary | ICD-10-CM

## 2012-02-24 DIAGNOSIS — K219 Gastro-esophageal reflux disease without esophagitis: Secondary | ICD-10-CM | POA: Diagnosis not present

## 2012-02-24 DIAGNOSIS — C180 Malignant neoplasm of cecum: Secondary | ICD-10-CM

## 2012-02-24 DIAGNOSIS — D509 Iron deficiency anemia, unspecified: Secondary | ICD-10-CM

## 2012-02-24 DIAGNOSIS — Z79899 Other long term (current) drug therapy: Secondary | ICD-10-CM | POA: Diagnosis not present

## 2012-02-24 DIAGNOSIS — G4730 Sleep apnea, unspecified: Secondary | ICD-10-CM | POA: Insufficient documentation

## 2012-02-24 DIAGNOSIS — R911 Solitary pulmonary nodule: Secondary | ICD-10-CM | POA: Diagnosis not present

## 2012-02-24 DIAGNOSIS — Z5111 Encounter for antineoplastic chemotherapy: Secondary | ICD-10-CM | POA: Diagnosis not present

## 2012-02-24 DIAGNOSIS — I10 Essential (primary) hypertension: Secondary | ICD-10-CM | POA: Diagnosis not present

## 2012-02-24 LAB — COMPREHENSIVE METABOLIC PANEL
Albumin: 3.4 g/dL — ABNORMAL LOW (ref 3.5–5.2)
BUN: 7 mg/dL (ref 6–23)
CO2: 28 mEq/L (ref 19–32)
Calcium: 9.1 mg/dL (ref 8.4–10.5)
Chloride: 97 mEq/L (ref 96–112)
Glucose, Bld: 123 mg/dL — ABNORMAL HIGH (ref 70–99)
Potassium: >7.5 mEq/L (ref 3.5–5.3)

## 2012-02-24 LAB — CBC WITH DIFFERENTIAL/PLATELET
Eosinophils Absolute: 0.5 10*3/uL (ref 0.0–0.5)
HCT: 29.5 % — ABNORMAL LOW (ref 34.8–46.6)
LYMPH%: 22.1 % (ref 14.0–49.7)
MCV: 67 fL — ABNORMAL LOW (ref 79.5–101.0)
MONO#: 0.7 10*3/uL (ref 0.1–0.9)
MONO%: 7.6 % (ref 0.0–14.0)
NEUT#: 5.6 10*3/uL (ref 1.5–6.5)
NEUT%: 64.4 % (ref 38.4–76.8)
Platelets: 311 10*3/uL (ref 145–400)
WBC: 8.7 10*3/uL (ref 3.9–10.3)

## 2012-02-24 MED ORDER — IOHEXOL 300 MG/ML  SOLN: 3.0000 mL | Freq: Once | INTRAMUSCULAR | Status: AC | PRN

## 2012-02-24 MED ORDER — IOHEXOL 300 MG/ML  SOLN: 5.0000 mL | Freq: Once | INTRAMUSCULAR | Status: AC | PRN

## 2012-02-24 MED ORDER — DEXAMETHASONE SODIUM PHOSPHATE 10 MG/ML IJ SOLN
10.0000 mg | Freq: Once | INTRAMUSCULAR | Status: AC
  Administered 2012-02-24: 10 mg via INTRAVENOUS

## 2012-02-24 MED ORDER — POTASSIUM CHLORIDE CRYS ER 20 MEQ PO TBCR: 20.0000 meq | EXTENDED_RELEASE_TABLET | Freq: Every day | ORAL | Status: DC

## 2012-02-24 MED ORDER — HEPARIN SOD (PORK) LOCK FLUSH 100 UNIT/ML IV SOLN
500.0000 [IU] | Freq: Once | INTRAVENOUS | Status: AC
  Administered 2012-02-24: 500 [IU]

## 2012-02-24 MED ORDER — OXALIPLATIN CHEMO INJECTION 100 MG/20ML
100.0000 mg/m2 | Freq: Once | INTRAVENOUS | Status: AC
  Administered 2012-02-24: 200 mg via INTRAVENOUS
  Filled 2012-02-24: qty 40

## 2012-02-24 MED ORDER — DEXTROSE 5 % IV SOLN
Freq: Once | INTRAVENOUS | Status: AC
  Administered 2012-02-24: 17:00:00 via INTRAVENOUS

## 2012-02-24 MED ORDER — ONDANSETRON 8 MG/50ML IVPB (CHCC)
8.0000 mg | Freq: Once | INTRAVENOUS | Status: AC
  Administered 2012-02-24: 8 mg via INTRAVENOUS

## 2012-02-24 NOTE — Telephone Encounter (Signed)
Pt home doing well. Confirmed 8-30 appt.

## 2012-02-24 NOTE — Progress Notes (Signed)
   [facility]    OFFICE PROGRESS NOTE   INTERVAL HISTORY:   She returns as scheduled. She continues to have discomfort in the right abdomen, but this has improved. She has a good appetite. Her bowels are functioning.  She underwent placement of a Port-A-Cath by Dr. Daphine Deutscher on 02/22/2012. The Port-A-Cath was left accessed.  Objective:  Vital signs in last 24 hours:  Blood pressure 138/75, pulse 87, temperature 98.4 F (36.9 C), temperature source Oral, resp. rate 18, height 5' 3.5" (1.613 m), weight 199 lb 8 oz (90.493 kg).   Resp: Lungs clear bilaterally Cardio: Regular rate and rhythm GI: Healed surgical incisions, no hepatomegaly, mild tenderness in the right upper abdomen Vascular: No leg edema   Portacath with an access needle in place and a gauze dressing  Lab Results:  Lab Results  Component Value Date   WBC 8.7 02/24/2012   HGB 9.3* 02/24/2012   HCT 29.5* 02/24/2012   MCV 67.0* 02/24/2012   PLT 311 02/24/2012   potassium 3.2 on 02/21/2012  X-rays: CT the chest 02/21/2012-a 5 x 6 on your nodule in the inferior left lower lobe appears larger than on the may 24th 2013 CT when it measured 3 mm   Medications: I have reviewed the patient's current medications.  Assessment/Plan: 1. 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 Diabetes  3. Microcytic anemia -stable, on iron replacement 4. Remote history of a "colon tumor "removed endoscopically in 1980  5. Left lower lung nodule noted on a CT 02/21/2012, slightly larger than on a CT 12/03/2011-plan for a 3 month interval CT 6. Status post Port-A-Cath placement 02/22/2012   Disposition:  She is scheduled to begin CAPOX chemotherapy today. The Xeloda will be delivered to her house today and she'll begin a first cycle of Xeloda tonight. She has attended chemotherapy teaching class.  The left lung nodule is most likely a benign finding. We will obtain  a three-month followup CT to monitor this nodule.  A baseline [name] will be checked today.  Ms. Lajeunesse will return for an office visit and the second cycle of CAPOX in 3 weeks.  She will begin a potassium supplement.   Thornton Papas, MD  02/24/2012  2:01 PM

## 2012-02-24 NOTE — Progress Notes (Signed)
1830- pt c/o tingling in throat/toes. NS wide open. 1835- pt ststes " feeling better"  1845 pt states " it's better, no more in my throat and toes." 1855 restarted rate 288 ml/hr remaining volume 239 cc 1930 pt denies tingling to throat/toes.

## 2012-02-24 NOTE — Assessment & Plan Note (Signed)
Peggy French is a 71 year old female patient of Dr. Truett Perna diagnosed with cancer of the ileocecal junction, status post right hemicolectomy, receiving adjuvant therapy.  MEDICAL HISTORY INCLUDES:  Gout, diabetes, hypertension, hyperlipidemia, GERD, sleep apnea on CPAP.  MEDICATIONS INCLUDE:  Xeloda, Nexium, ferrous sulfate, Compazine, Zocor.  LABS:  Potassium of 3.2, glucose 194, albumin 3.2 on August 12.  HEIGHT:  63-1/2 inches. WEIGHT:  199.5 pounds August 15. USUAL BODY WEIGHT:  211 pounds December 2012. BMI:  34.78.  The patient denies nutritional side effects at this time.  She is receiving her 1st chemotherapy treatment today.  She has basic knowledge of the diabetes diet as well as trying to consume a lower sodium diet secondary to her hypertension. She also monitors her intake of purine foods to avoid flare-ups and gout.  NUTRITION DIAGNOSIS:  Food and nutrition related knowledge deficit related to new diagnosis of colon cancer and associated treatments as evidenced by no prior need for nutrition-related information.  INTERVENTION:  I have educated the patient on the importance of smaller, more frequent meals utilizing adequate calories and protein to promote weight maintenance.  I have encouraged her to choose a protein foods every time she eats or drinks and we have discussed appropriate snacks for her to consume to help keep her blood sugars controlled.  I have given her basic education on eating with nausea.  I have provided her fact sheets today and my contact information.  I have answered her questions.  MONITORING/EVALUATION (GOALS):  That patient will tolerate adequate calories and protein to promote weight maintenance throughout treatment.  NEXT VISIT:  I will attempt to see the patient during her treatment times.  So far there is no followup scheduled.   ______________________________ Peggy French, RD, CSO, LDN Clinical Nutrition Specialist BN/MEDQ  D:  02/24/2012  T:   02/24/2012  Job:  [phone]

## 2012-02-24 NOTE — Telephone Encounter (Signed)
Nurse calling from Dr. Kalman Drape office to report that they were unable to access and flush pt's PAC today.  She was sent for imaging and the PAC is "kinked".  Will let Dr. Daphine Deutscher know, and will f/u with Ms. Amie Critchley.

## 2012-02-24 NOTE — Telephone Encounter (Signed)
Reports PAC is kinked just below area of incision and he can't inject or unkink it. Will need to be removed/revised by Dr. Luretha Murphy. They will deaccess and send her back to tx area. Dr. Truett Perna would like to try to give 1st treatment today via peripheral vein if one can be found and patient agrees. Will notify surgeon of PAC difficulty.

## 2012-02-24 NOTE — Telephone Encounter (Signed)
Made surgeon office aware of PAC problem and need for revision.

## 2012-02-24 NOTE — Patient Instructions (Addendum)
[facility] Discharge Instructions for Patients Receiving Chemotherapy  Today you received the following chemotherapy agent Oxaliplatin.  To help prevent nausea and vomiting after your treatment, we encourage you to take your nausea medication. Begin taking your nausea medication as often as prescribed for by Dr. Truett Perna.    If you develop nausea and vomiting that is not controlled by your nausea medication, call the clinic. If it is after clinic hours your family physician or the after hours number for the clinic or go to the Emergency Department.   BELOW ARE SYMPTOMS THAT SHOULD BE REPORTED IMMEDIATELY:  *FEVER GREATER THAN 100.5 F  *CHILLS WITH OR WITHOUT FEVER  NAUSEA AND VOMITING THAT IS NOT CONTROLLED WITH YOUR NAUSEA MEDICATION  *UNUSUAL SHORTNESS OF BREATH  *UNUSUAL BRUISING OR BLEEDING  TENDERNESS IN MOUTH AND THROAT WITH OR WITHOUT PRESENCE OF ULCERS  *URINARY PROBLEMS  *BOWEL PROBLEMS  UNUSUAL RASH Items with * indicate a potential emergency and should be followed up as soon as possible.  One of the nurses will contact you 24 hours after your treatment. Please let the nurse know about any problems that you may have experienced. Feel free to call the clinic you have any questions or concerns. The clinic phone number is [phone].   I have been informed and understand all the instructions given to me. I know to contact the clinic, my physician, or go to the Emergency Department if any problems should occur. I do not have any questions at this time, but understand that I may call the clinic during office hours   should I have any questions or need assistance in obtaining follow up care.    __________________________________________  _____________  __________ Signature of Patient or Authorized Representative            Date                   Time    __________________________________________ Nurse's Signature   OXALIPLATIN (ox AL i PLA tin)  is a chemotherapy drug. It targets fast dividing cells, like cancer cells, and causes these cells to die. This medicine is used to treat cancers of the colon and rectum, and many other cancers. This medicine may be used for other purposes; ask your health care provider or pharmacist if you have questions. What should I tell my health care provider before I take this medicine? They need to know if you have any of these conditions: -kidney disease -an unusual or allergic reaction to oxaliplatin, other chemotherapy, other medicines, foods, dyes, or preservatives -pregnant or trying to get pregnant -breast-feeding How should I use this medicine? This drug is given as an infusion into a vein. It is administered in a hospital or clinic by a specially trained health care professional. Talk to your pediatrician regarding the use of this medicine in children. Special care may be needed. Overdosage: If you think you have taken too much of this medicine contact a poison control center or emergency room at once. NOTE: This medicine is only for you. Do not share this medicine with others. What if I miss a dose? It is important not to miss a dose. Call your doctor or health care professional if you are unable to keep an appointment. What may interact with this medicine? -medicines to increase blood counts like filgrastim, pegfilgrastim, sargramostim -probenecid -some antibiotics like amikacin, gentamicin, neomycin, polymyxin B, streptomycin, tobramycin -zalcitabine Talk to your doctor or health care professional before taking any of  these medicines: -acetaminophen -aspirin -ibuprofen -ketoprofen -naproxen This list may not describe all possible interactions. Give your health care provider a list of all the medicines, herbs, non-prescription drugs, or dietary supplements you use. Also tell them if you smoke, drink alcohol, or use illegal drugs. Some items may interact with your medicine. What should I  watch for while using this medicine? Your condition will be monitored carefully while you are receiving this medicine. You will need important blood work done while you are taking this medicine. This medicine can make you more sensitive to cold. Do not drink cold drinks or use ice. Cover exposed skin before coming in contact with cold temperatures or cold objects. When out in cold weather wear warm clothing and cover your mouth and nose to warm the air that goes into your lungs. Tell your doctor if you get sensitive to the cold. This drug may make you feel generally unwell. This is not uncommon, as chemotherapy can affect healthy cells as well as cancer cells. Report any side effects. Continue your course of treatment even though you feel ill unless your doctor tells you to stop. In some cases, you may be given additional medicines to help with side effects. Follow all directions for their use. Call your doctor or health care professional for advice if you get a fever, chills or sore throat, or other symptoms of a cold or flu. Do not treat yourself. This drug decreases your body's ability to fight infections. Try to avoid being around people who are sick. This medicine may increase your risk to bruise or bleed. Call your doctor or health care professional if you notice any unusual bleeding. Be careful brushing and flossing your teeth or using a toothpick because you may get an infection or bleed more easily. If you have any dental work done, tell your dentist you are receiving this medicine. Avoid taking products that contain aspirin, acetaminophen, ibuprofen, naproxen, or ketoprofen unless instructed by your doctor. These medicines may hide a fever. Do not become pregnant while taking this medicine. Women should inform their doctor if they wish to become pregnant or think they might be pregnant. There is a potential for serious side effects to an unborn child. Talk to your health care professional or  pharmacist for more information. Do not breast-feed an infant while taking this medicine. Call your doctor or health care professional if you get diarrhea. Do not treat yourself. What side effects may I notice from receiving this medicine? Side effects that you should report to your doctor or health care professional as soon as possible: -allergic reactions like skin rash, itching or hives, swelling of the face, lips, or tongue -low blood counts - This drug may decrease the number of white blood cells, red blood cells and platelets. You may be at increased risk for infections and bleeding. -signs of infection - fever or chills, cough, sore throat, pain or difficulty passing urine -signs of decreased platelets or bleeding - bruising, pinpoint red spots on the skin, black, tarry stools, nosebleeds -signs of decreased red blood cells - unusually weak or tired, fainting spells, lightheadedness -breathing problems -chest pain, pressure -cough -diarrhea -jaw tightness -mouth sores -nausea and vomiting -pain, swelling, redness or irritation at the injection site -pain, tingling, numbness in the hands or feet -problems with balance, talking, walking -redness, blistering, peeling or loosening of the skin, including inside the mouth -trouble passing urine or change in the amount of urine Side effects that usually do not require  medical attention (report to your doctor or health care professional if they continue or are bothersome): -changes in vision -constipation -hair loss -loss of appetite -metallic taste in the mouth or changes in taste -stomach pain This list may not describe all possible side effects. Call your doctor for medical advice about side effects. You may report side effects to FDA at 1-800-FDA-1088. Where should I keep my medicine? This drug is given in a hospital or clinic and will not be stored at home. NOTE: This sheet is a summary. It may not cover all possible information.  If you have questions about this medicine, talk to your doctor, pharmacist, or health care provider.  2012, [employer] Standard. (01/23/2008 5:22:47 PM)

## 2012-02-25 ENCOUNTER — Telehealth: Payer: Self-pay | Admitting: *Deleted

## 2012-02-25 ENCOUNTER — Other Ambulatory Visit (INDEPENDENT_AMBULATORY_CARE_PROVIDER_SITE_OTHER): Payer: Self-pay | Admitting: General Surgery

## 2012-02-25 NOTE — Telephone Encounter (Signed)
Per staff message and POF I have scheduled appt.  JMW  

## 2012-02-25 NOTE — Telephone Encounter (Addendum)
PT.'S ARM IS A LITTLE PUFFY AT INSERTION SITE. NO DISCOLORATION AND THERE IS NO INCREASED WARMTH. ALSO PT. HAS A "FROSTY FEELING" WHEN SHE SWALLOWS EVEN THOUGH HER LIQUIDS AND FOOD ARE WARM. PT. STATES WHEN SHE EATS IT FEELS LIKE HER "JAW BONE IS SLIPPING". FLUID INTAKE IN THE PAST 24 HOURS HAS BEEN 24 OUNCES. NO NAUSEA OR VOMITING. NO DIARRHEA. NO OTHER PROBLEMS WITH HER MOUTH. PT. WANTED TO KNOW IF SHE SHOULD TAKE HER XELODA THIS MORNING? THIS NOTE TO DR.SHERRILL'S NURSE, SUSAN COWARD,RN.

## 2012-02-25 NOTE — Telephone Encounter (Signed)
INSTRUCTED PT. TO ELEVATE LEFT ARM AND TRY WARM MOIST COMPRESSES TO THE AREA. INFORMED PT. THAT SHE IS EXPERIENCING NORMAL SIDE AFFECTS OF HER CHEMOTHERAPY. ENCOURAGED PT. TO FORCE FLUIDS AND RECOMMENDED FOODS THAT WOULD DECREASED JAW DISCOMFORT. NOTIFIED PT. TO START XELODA AS ORDERED TODAY. SHE VOICES UNDERSTANDING.

## 2012-02-28 ENCOUNTER — Telehealth: Payer: Self-pay | Admitting: *Deleted

## 2012-02-28 NOTE — Telephone Encounter (Signed)
As of 02-28-2012 patient as been scheduled for all the appointments

## 2012-02-29 ENCOUNTER — Encounter (HOSPITAL_COMMUNITY): Payer: Self-pay | Admitting: Anesthesiology

## 2012-02-29 ENCOUNTER — Ambulatory Visit (HOSPITAL_COMMUNITY)
Admission: RE | Admit: 2012-02-29 | Discharge: 2012-02-29 | Disposition: A | Discharge status: Home / Self Care | Payer: Medicare Other | Source: Ambulatory Visit | Attending: Surgery | Admitting: Surgery

## 2012-02-29 ENCOUNTER — Ambulatory Visit (HOSPITAL_COMMUNITY): Payer: Medicare Other | Admitting: Anesthesiology

## 2012-02-29 ENCOUNTER — Ambulatory Visit (HOSPITAL_COMMUNITY): Payer: Medicare Other

## 2012-02-29 ENCOUNTER — Encounter (HOSPITAL_COMMUNITY)
Admission: RE | Disposition: A | Discharge status: Home / Self Care | Payer: Self-pay | Source: Ambulatory Visit | Attending: Surgery

## 2012-02-29 ENCOUNTER — Encounter (HOSPITAL_COMMUNITY): Payer: Self-pay | Admitting: *Deleted

## 2012-02-29 DIAGNOSIS — K219 Gastro-esophageal reflux disease without esophagitis: Secondary | ICD-10-CM | POA: Diagnosis not present

## 2012-02-29 DIAGNOSIS — I10 Essential (primary) hypertension: Secondary | ICD-10-CM | POA: Insufficient documentation

## 2012-02-29 DIAGNOSIS — G4730 Sleep apnea, unspecified: Secondary | ICD-10-CM | POA: Diagnosis not present

## 2012-02-29 DIAGNOSIS — Z452 Encounter for adjustment and management of vascular access device: Secondary | ICD-10-CM | POA: Diagnosis not present

## 2012-02-29 DIAGNOSIS — T82898A Other specified complication of vascular prosthetic devices, implants and grafts, initial encounter: Secondary | ICD-10-CM | POA: Diagnosis not present

## 2012-02-29 DIAGNOSIS — T82598A Other mechanical complication of other cardiac and vascular devices and implants, initial encounter: Secondary | ICD-10-CM | POA: Diagnosis not present

## 2012-02-29 DIAGNOSIS — Z79899 Other long term (current) drug therapy: Secondary | ICD-10-CM | POA: Insufficient documentation

## 2012-02-29 LAB — SURGICAL PCR SCREEN: MRSA, PCR: NEGATIVE

## 2012-02-29 LAB — POCT I-STAT 4, (NA,K, GLUC, HGB,HCT)
Hemoglobin: 11.9 g/dL — ABNORMAL LOW (ref 12.0–15.0)
Potassium: 3.7 mEq/L (ref 3.5–5.1)
Sodium: 139 mEq/L (ref 135–145)

## 2012-02-29 LAB — GLUCOSE, CAPILLARY: Glucose-Capillary: 107 mg/dL — ABNORMAL HIGH (ref 70–99)

## 2012-02-29 SURGERY — REVISION, PORT-A-CATH INSERTION
Anesthesia: Monitor Anesthesia Care | Site: Abdomen | Wound class: Clean

## 2012-02-29 MED ORDER — HEPARIN SODIUM (PORCINE) 5000 UNIT/ML IJ SOLN
Freq: Once | INTRAMUSCULAR | Status: DC
  Filled 2012-02-29: qty 1.2

## 2012-02-29 MED ORDER — ONDANSETRON HCL 4 MG/2ML IJ SOLN
INTRAMUSCULAR | Status: DC | PRN
  Administered 2012-02-29 (×2): 2 mg via INTRAVENOUS

## 2012-02-29 MED ORDER — ACETAMINOPHEN 10 MG/ML IV SOLN: 1000.0000 mg | Freq: Once | INTRAVENOUS | Status: DC | PRN

## 2012-02-29 MED ORDER — MEPERIDINE HCL 50 MG/ML IJ SOLN: 6.2500 mg | INTRAMUSCULAR | Status: DC | PRN

## 2012-02-29 MED ORDER — CEFAZOLIN SODIUM-DEXTROSE 2-3 GM-% IV SOLR
2.0000 g | INTRAVENOUS | Status: AC
  Administered 2012-02-29: 2 g via INTRAVENOUS

## 2012-02-29 MED ORDER — LIDOCAINE HCL 1 % IJ SOLN
INTRAMUSCULAR | Status: DC | PRN
  Administered 2012-02-29: 10 mL

## 2012-02-29 MED ORDER — LIDOCAINE HCL 1 % IJ SOLN
INTRAMUSCULAR | Status: AC
  Filled 2012-02-29: qty 20

## 2012-02-29 MED ORDER — LACTATED RINGERS IV SOLN: INTRAVENOUS | Status: DC

## 2012-02-29 MED ORDER — SODIUM CHLORIDE 0.9 % IV SOLN
INTRAVENOUS | Status: DC | PRN
  Administered 2012-02-29: 10 mL via INTRAMUSCULAR
  Administered 2012-02-29: 20 mL via INTRAMUSCULAR

## 2012-02-29 MED ORDER — MUPIROCIN 2 % EX OINT
TOPICAL_OINTMENT | Freq: Once | CUTANEOUS | Status: AC
  Administered 2012-02-29: 10:00:00 via NASAL
  Filled 2012-02-29: qty 22

## 2012-02-29 MED ORDER — HEPARIN SOD (PORK) LOCK FLUSH 100 UNIT/ML IV SOLN
INTRAVENOUS | Status: DC | PRN
  Administered 2012-02-29: 300 [IU]
  Administered 2012-02-29: 500 [IU]

## 2012-02-29 MED ORDER — PROPOFOL 10 MG/ML IV EMUL
INTRAVENOUS | Status: DC | PRN
  Administered 2012-02-29: 160 ug/kg/min via INTRAVENOUS

## 2012-02-29 MED ORDER — HEPARIN SOD (PORK) LOCK FLUSH 100 UNIT/ML IV SOLN
INTRAVENOUS | Status: AC
  Filled 2012-02-29: qty 10

## 2012-02-29 MED ORDER — OXYCODONE HCL 5 MG PO TABS: 5.0000 mg | ORAL_TABLET | Freq: Once | ORAL | Status: DC | PRN

## 2012-02-29 MED ORDER — OXYCODONE HCL 5 MG/5ML PO SOLN
5.0000 mg | Freq: Once | ORAL | Status: DC | PRN
  Filled 2012-02-29: qty 5

## 2012-02-29 MED ORDER — HEPARIN SOD (PORK) LOCK FLUSH 100 UNIT/ML IV SOLN
INTRAVENOUS | Status: AC
  Filled 2012-02-29: qty 5

## 2012-02-29 MED ORDER — EPHEDRINE SULFATE 50 MG/ML IJ SOLN
INTRAMUSCULAR | Status: DC | PRN
  Administered 2012-02-29 (×2): 2.5 mg via INTRAVENOUS

## 2012-02-29 MED ORDER — PROMETHAZINE HCL 25 MG/ML IJ SOLN: 6.2500 mg | INTRAMUSCULAR | Status: DC | PRN

## 2012-02-29 MED ORDER — FENTANYL CITRATE 0.05 MG/ML IJ SOLN
INTRAMUSCULAR | Status: DC | PRN
  Administered 2012-02-29 (×2): 50 ug via INTRAVENOUS

## 2012-02-29 MED ORDER — HYDROMORPHONE HCL PF 1 MG/ML IJ SOLN
0.2500 mg | INTRAMUSCULAR | Status: DC | PRN
  Administered 2012-02-29: 0.5 mg via INTRAVENOUS

## 2012-02-29 MED ORDER — CHLORHEXIDINE GLUCONATE 4 % EX LIQD
1.0000 "application " | Freq: Once | CUTANEOUS | Status: DC
  Filled 2012-02-29: qty 15

## 2012-02-29 MED ORDER — HYDROMORPHONE HCL PF 1 MG/ML IJ SOLN
INTRAMUSCULAR | Status: AC
  Filled 2012-02-29: qty 1

## 2012-02-29 MED ORDER — LACTATED RINGERS IV SOLN
INTRAVENOUS | Status: DC | PRN
  Administered 2012-02-29 (×2): via INTRAVENOUS

## 2012-02-29 MED ORDER — MIDAZOLAM HCL 5 MG/5ML IJ SOLN
INTRAMUSCULAR | Status: DC | PRN
  Administered 2012-02-29 (×2): 1 mg via INTRAVENOUS

## 2012-02-29 MED ORDER — PROPRANOLOL HCL 40 MG PO TABS
40.0000 mg | ORAL_TABLET | Freq: Once | ORAL | Status: AC
  Administered 2012-02-29: 40 mg via ORAL
  Filled 2012-02-29: qty 1

## 2012-02-29 MED ORDER — CEFAZOLIN SODIUM-DEXTROSE 2-3 GM-% IV SOLR
INTRAVENOUS | Status: AC
  Filled 2012-02-29: qty 50

## 2012-02-29 SURGICAL SUPPLY — 40 items
ADH SKN CLS APL DERMABOND .7 (GAUZE/BANDAGES/DRESSINGS) ×1
BAG DECANTER FOR FLEXI CONT (MISCELLANEOUS) IMPLANT
BENZOIN TINCTURE PRP APPL 2/3 (GAUZE/BANDAGES/DRESSINGS) IMPLANT
BLADE HEX COATED 2.75 (ELECTRODE) ×2 IMPLANT
BLADE SURG 15 STRL LF DISP TIS (BLADE) ×1 IMPLANT
BLADE SURG 15 STRL SS (BLADE) ×2
CLOTH BEACON ORANGE TIMEOUT ST (SAFETY) ×2 IMPLANT
DECANTER SPIKE VIAL GLASS SM (MISCELLANEOUS) ×6 IMPLANT
DERMABOND ADVANCED (GAUZE/BANDAGES/DRESSINGS) ×1
DERMABOND ADVANCED .7 DNX12 (GAUZE/BANDAGES/DRESSINGS) ×1 IMPLANT
DRAPE C-ARM 42X72 X-RAY (DRAPES) IMPLANT
DRAPE PED LAPAROTOMY (DRAPES) ×2 IMPLANT
ELECT REM PT RETURN 9FT ADLT (ELECTROSURGICAL) ×2
ELECTRODE REM PT RTRN 9FT ADLT (ELECTROSURGICAL) ×1 IMPLANT
GAUZE SPONGE 2X2 8PLY STRL LF (GAUZE/BANDAGES/DRESSINGS) IMPLANT
GAUZE SPONGE 4X4 16PLY XRAY LF (GAUZE/BANDAGES/DRESSINGS) ×2 IMPLANT
GLOVE BIOGEL M 8.0 STRL (GLOVE) ×2 IMPLANT
GLOVE BIOGEL PI IND STRL 7.0 (GLOVE) ×1 IMPLANT
GLOVE BIOGEL PI INDICATOR 7.0 (GLOVE) ×1
GOWN STRL NON-REIN LRG LVL3 (GOWN DISPOSABLE) ×6 IMPLANT
GOWN STRL REIN XL XLG (GOWN DISPOSABLE) ×4 IMPLANT
KIT BARDPORT ISP (Port) IMPLANT
KIT BARDPORT ISP 9.6FR (PORTABLE EQUIPMENT SUPPLIES) IMPLANT
KIT BASIN OR (CUSTOM PROCEDURE TRAY) ×2 IMPLANT
NEEDLE HYPO 22GX1.5 SAFETY (NEEDLE) ×2 IMPLANT
NEEDLE HYPO 25X1 1.5 SAFETY (NEEDLE) ×2 IMPLANT
NS IRRIG 1000ML POUR BTL (IV SOLUTION) ×2 IMPLANT
PACK BASIC VI WITH GOWN DISP (CUSTOM PROCEDURE TRAY) ×2 IMPLANT
PEN SKIN MARKING BROAD (MISCELLANEOUS) IMPLANT
PENCIL BUTTON HOLSTER BLD 10FT (ELECTRODE) ×2 IMPLANT
SPONGE GAUZE 2X2 STER 10/PKG (GAUZE/BANDAGES/DRESSINGS)
SPONGE GAUZE 4X4 12PLY (GAUZE/BANDAGES/DRESSINGS) IMPLANT
STRIP CLOSURE SKIN 1/2X4 (GAUZE/BANDAGES/DRESSINGS) ×2 IMPLANT
SUT PROLENE 2 0 CT2 30 (SUTURE) ×2 IMPLANT
SUT PROLENE 2 0 SH DA (SUTURE) ×2 IMPLANT
SUT SILK 1 TIES 10/18 (SUTURE) IMPLANT
SUT VIC AB 4-0 SH 18 (SUTURE) ×2 IMPLANT
SYR BULB IRRIGATION 50ML (SYRINGE) ×2 IMPLANT
SYR CONTROL 10ML LL (SYRINGE) ×2 IMPLANT
SYRINGE 10CC LL (SYRINGE) ×2 IMPLANT

## 2012-02-29 NOTE — Transfer of Care (Signed)
Immediate Anesthesia Transfer of Care Note  Patient: Peggy French  Procedure(s) Performed: Procedure(s) (LRB): PORT A CATH REVISION (N/A)  Patient Location: PACU  Anesthesia Type: MAC  Level of Consciousness: awake, alert , sedated and patient cooperative  Airway & Oxygen Therapy: Patient Spontanous Breathing and Patient connected to nasal cannula oxygen  Post-op Assessment: Report given to PACU RN, Post -op Vital signs reviewed and stable and Patient moving all extremities  Post vital signs: Reviewed and stable  Complications: No apparent anesthesia complications

## 2012-02-29 NOTE — Anesthesia Preprocedure Evaluation (Signed)
Anesthesia Evaluation  Patient identified by MRN, date of birth, ID band Patient awake    Reviewed: Allergy & Precautions, H&P , NPO status , Patient's Chart, lab work & pertinent test results  Airway Mallampati: II TM Distance: >3 FB Neck ROM: Full    Dental No notable dental hx. (+) Edentulous Upper   Pulmonary sleep apnea ,  breath sounds clear to auscultation  Pulmonary exam normal       Cardiovascular hypertension, Pt. on medications Rhythm:Regular Rate:Normal     Neuro/Psych negative neurological ROS  negative psych ROS   GI/Hepatic Neg liver ROS, GERD-  Medicated,  Endo/Other  Type obesity  Renal/GU negative Renal ROS  negative genitourinary   Musculoskeletal negative musculoskeletal ROS (+)   Abdominal   Peds negative pediatric ROS (+)  Hematology negative hematology ROS (+)   Anesthesia Other Findings   Reproductive/Obstetrics negative OB ROS                           Anesthesia Physical  Anesthesia Plan  ASA: III  Anesthesia Plan: MAC   Post-op Pain Management:    Induction: Intravenous  Airway Management Planned: Simple Face Mask  Additional Equipment:   Intra-op Plan:   Post-operative Plan:   Informed Consent: I have reviewed the patients History and Physical, chart, labs and discussed the procedure including the risks, benefits and alternatives for the proposed anesthesia with the patient or authorized representative who has indicated his/her understanding and acceptance.   Dental advisory given  Plan Discussed with: CRNA and Surgeon  Anesthesia Plan Comments:         Anesthesia Quick Evaluation

## 2012-02-29 NOTE — Preoperative (Signed)
Beta Blockers   Reason not to administer Beta Blockers:Not Applicable 

## 2012-02-29 NOTE — Anesthesia Postprocedure Evaluation (Signed)
Anesthesia Post Note  Patient: Peggy French  Procedure(s) Performed: Procedure(s) (LRB): PORT A CATH REVISION (N/A)  Anesthesia type: MAC  Patient location: PACU  Post pain: Pain level controlled  Post assessment: Post-op Vital signs reviewed  Last Vitals: BP 137/76  Pulse 78  Temp 36.6 C  Resp 19  Ht 5\' 3"  (1.6 m)  Wt 193 lb 6 oz (87.714 kg)  BMI 34.25 kg/m2  SpO2 100%  Post vital signs: Reviewed  Level of consciousness: awake  Complications: No apparent anesthesia complications

## 2012-02-29 NOTE — Op Note (Signed)
Surgeon: Wenda Low, MD, FACS  Asst:  none  Anes:  Local 1 % lidocaine, MAC  Procedure: Explant and 1.5 cm shortening of portacath tubing with reimplantation of same hardware.    Diagnosis: Powerport flushes but doesn't aspirate  Complications: none  EBL:   4 cc  Description of Procedure:  The patient was taken to OR 1 and given IV sedation.  The Dermabond from the prior procedure was removed.  Prepped with PCMX and timeout.  Reincision and removal of old vicryls and prolene.  Direct puncture with Huber needle and easy flush but limited aspiration.  Explanted and cather pulled back 1.5 cm.  Easy aspiration; flushed with conc heparin.  Excess tubing removed and reconnected to the port.  Reimplant after irrigation.  Reflushed with conc heparin and easy flush and aspiration.  Irrigated portacath bed.  Secured with medial 2-0 prolene.  Closed with 4-0 vicryl and Dermabond.   Matt B. Daphine Deutscher, MD, [facility], [place] [phone]

## 2012-03-03 ENCOUNTER — Ambulatory Visit: Payer: Medicare Other | Admitting: Nutrition

## 2012-03-03 ENCOUNTER — Telehealth: Payer: Self-pay | Admitting: *Deleted

## 2012-03-03 NOTE — Telephone Encounter (Signed)
Pt called asking if it was okay to drink cold if it has been 5 days after her chemo.  Informed her that if she does not have a cold sensitivity after 5 days she can drink cold and eat cold as long as she tolerates it okay.  She verbalized understanding.  SLJ

## 2012-03-03 NOTE — Progress Notes (Signed)
Ms. Mastrogiovanni had questions today regarding when she could add ice to her drinks.  She received oxaliplatin on the 15th and she reports drinking an iced down ginger ale on the 20th when she came to the cancer center and she had no reaction to that and she was curious if she could add ice to her other drinks.  She also was noted to have weight loss down to 193.3 pounds on the 20th from 199.5 pounds August 15.  The patient reports she did suffer from nausea and some diarrhea but that has improved and her appetite has improved.  NUTRITION DIAGNOSIS:  Food and nutrition related knowledge deficit continues.  INTERVENTION:  I have educated the patient on strategies for increasing oral intake now that she is able to eat again.  We discussed the importance of small frequent meals.  I did discuss basic food safety guidelines with her.  She has been referred to nursing to answer her questions regarding her chemo medications and some side effects she is having.  The patient was appreciative.  MONITORING/EVALUATION/GOALS:  The patient will tolerate adequate calories and protein to minimize further weight loss.  NEXT VISIT:  Thursday September 5 during chemotherapy.   ______________________________ Zenovia Jarred, RD, CSO, LDN Clinical Nutrition Specialist BN/MEDQ  D:  03/03/2012  T:  03/03/2012  Job:  [id number]

## 2012-03-07 ENCOUNTER — Telehealth: Payer: Self-pay | Admitting: *Deleted

## 2012-03-07 NOTE — Telephone Encounter (Signed)
Spoke with patient by phone.  Answered questions regarding schedule for Xeloda and appointments.  Patient states she is feeling much better and has a better appetite.  She denies nausea/vomiting, diarrhea, pain, fever, or other symptoms.  Will continue to follow.

## 2012-03-10 ENCOUNTER — Ambulatory Visit (INDEPENDENT_AMBULATORY_CARE_PROVIDER_SITE_OTHER): Payer: Medicare Other | Admitting: Surgery

## 2012-03-10 ENCOUNTER — Encounter (INDEPENDENT_AMBULATORY_CARE_PROVIDER_SITE_OTHER): Payer: Self-pay | Admitting: Surgery

## 2012-03-10 VITALS — BP 116/62 | HR 60 | Temp 97.0°F | Resp 25 | Ht 63.75 in | Wt 193.0 lb

## 2012-03-10 DIAGNOSIS — C182 Malignant neoplasm of ascending colon: Secondary | ICD-10-CM

## 2012-03-10 NOTE — Progress Notes (Signed)
Peggy French 71 y.o.  Body mass index is 33.39 kg/(m^2).  Patient Active Problem List  Diagnosis  . Gout  . Diabetes mellitus  . Hypertension  . Hyperlipidemia  . Cancer of the ileocecal junction  . S/P right colectomy    Allergies  Allergen Reactions  . Aspirin Palpitations    Past Surgical History  Procedure Date  . Cholecystectomy   . Abdominal hysterectomy   . Colon surgery     Tumor  . Esophagogastroduodenoscopy 06/16/2011    Procedure: ESOPHAGOGASTRODUODENOSCOPY (EGD);  Surgeon: Malissa Hippo, MD;  Location: [facility];  Service: Endoscopy;  Laterality: N/A;  2:15  . Colonoscopy 11/26/2011    Procedure: COLONOSCOPY;  Surgeon: Malissa Hippo, MD;  Location: [facility];  Service: Endoscopy;  Laterality: N/A;  2:15  . Appendectomy   . Cardiac catheterization 2001,2012  . Portacath placement 02/22/2012    Procedure: INSERTION PORT-A-CATH;  Surgeon: Valarie Merino, MD;  Location: [facility] ORS;  Service: General;  Laterality: N/A;  left subclavian   [name], MD No diagnosis found.  Redo portacath site looks good.  No redness.  Was in good position and it infused and aspirated just fine.   Return in 6 months Matt B. Daphine Deutscher, MD, [facility] [phone] beeper [phone]  03/10/2012 3:43 PM

## 2012-03-10 NOTE — Patient Instructions (Signed)
Thanks for your patience.  If you need further assistance after leaving the office, please call our office and speak with a [facility] nurse.  [phone].  If you want to leave a message for Dr. Meghin Thivierge, please call his office phone at [phone]. 

## 2012-03-14 NOTE — H&P (Signed)
Chief Complaint:  Port won't aspirate  History of Present Illness:  Peggy French is an 71 y.o. female had portacath placed 2 weeks ago and although it flushes, it will not draw back.  Posted for repositioning  Past Medical History  Diagnosis Date  . Gout   . Diabetes mellitus     x over 10 yrs  . Hyperlipidemia   . GERD (gastroesophageal reflux disease)   . Chest pain   . H/O hiatal hernia   . Headache     hx migraines  . Arthritis   . Sleep apnea     does not have CPAP machine  . Cancer     LOV Dr Truett Perna 02/14/12 [facility]  . Asthmatic bronchitis 2011  . Hypertension     EKG 10/12 [facility], chest- 1 view  6/13 [facility]    Past Surgical History  Procedure Date  . Cholecystectomy   . Abdominal hysterectomy   . Colon surgery     Tumor  . Esophagogastroduodenoscopy 06/16/2011    Procedure: ESOPHAGOGASTRODUODENOSCOPY (EGD);  Surgeon: Malissa Hippo, MD;  Location: [facility];  Service: Endoscopy;  Laterality: N/A;  2:15  . Colonoscopy 11/26/2011    Procedure: COLONOSCOPY;  Surgeon: Malissa Hippo, MD;  Location: [facility];  Service: Endoscopy;  Laterality: N/A;  2:15  . Appendectomy   . Cardiac catheterization 2001,2012  . Portacath placement 02/22/2012    Procedure: INSERTION PORT-A-CATH;  Surgeon: Valarie Merino, MD;  Location: [facility];  Service: General;  Laterality: N/A;  left subclavian    No current facility-administered medications for this encounter.   Current Outpatient Prescriptions  Medication Sig Dispense Refill  . amLODipine (NORVASC) 5 MG tablet Take 5 mg by mouth daily after breakfast.       . capecitabine (XELODA) 500 MG tablet Take 4 tablets (2,000 mg total) by mouth 2 (two) times daily after a meal. Then 7 days rest.  112 tablet  2  . esomeprazole (NEXIUM) 40 MG capsule Take 40 mg by mouth every evening.      . ferrous sulfate 325 (65 FE) MG tablet Take 325 mg by mouth 2 (two) times daily.      Marland Kitchen gabapentin (NEURONTIN) 100 MG capsule Take 100 mg by mouth  at bedtime.       . hyoscyamine (LEVSIN, ANASPAZ) 0.125 MG tablet Take 0.125 mg by mouth every 4 (four) hours as needed. For cramping      . linagliptin (TRADJENTA) 5 MG TABS tablet Take 5 mg by mouth daily after breakfast.       . potassium chloride SA (K-DUR,KLOR-CON) 20 MEQ tablet Take 1 tablet (20 mEq total) by mouth daily.  30 tablet  2  . prochlorperazine (COMPAZINE) 10 MG tablet Take 1 tablet (10 mg total) by mouth every 6 (six) hours as needed.  30 tablet  3  . propranolol-hydrochlorothiazide (INDERIDE) 40-25 MG per tablet Take 1 tablet by mouth daily with breakfast.       . ramipril (ALTACE) 5 MG capsule Take 5 mg by mouth daily with breakfast.       . simvastatin (ZOCOR) 40 MG tablet Take 40 mg by mouth at bedtime.       Marland Kitchen acetaminophen (TYLENOL) 500 MG tablet Take 1,000 mg by mouth every 6 (six) hours as needed. For pain      . fexofenadine (ALLEGRA) 180 MG tablet Take 180 mg by mouth daily as needed. For Allergies      . lidocaine-prilocaine (  EMLA) cream Apply topically as needed. Apply to port-a-cath 1-2 hours prior to chemo.  Cover with plastic wrap  30 g  1  . oxyCODONE-acetaminophen (PERCOCET/ROXICET) 5-325 MG per tablet Take 1 tablet by mouth every 4 (four) hours as needed.       Aspirin Family History  Problem Relation Age of Onset  . Cancer Maternal Aunt     throat,    Social History:   reports that she has never smoked. She has never used smokeless tobacco. She reports that she does not drink alcohol or use illicit drugs.   REVIEW OF SYSTEMS - PERTINENT POSITIVES ONLY: Non contributory  Physical Exam:   Blood pressure 148/88, pulse 76, temperature 97 F (36.1 C), resp. rate 12, height 5\' 3"  (1.6 m), weight 193 lb 6 oz (87.714 kg), SpO2 96.00%. Body mass index is 34.25 kg/(m^2).  Gen:  WDWN AAF NAD  Neurological: Alert and oriented to person, place, and time. Motor and sensory function is grossly intact  Head: Normocephalic and atraumatic.  Eyes: Conjunctivae are  normal. Pupils are equal, round, and reactive to light. No scleral icterus.  Neck: Normal range of motion. Neck supple. No tracheal deviation or thyromegaly present.  Cardiovascular:  SR without murmurs or gallops.  No carotid bruits Respiratory: Effort normal.  No respiratory distress. No chest wall tenderness. Breath sounds normal.  No wheezes, rales or rhonchi.  Abdomen:  nontender GU: Musculoskeletal: Normal range of motion. Extremities are nontender. No cyanosis, edema or clubbing noted Lymphadenopathy: No cervical, preauricular, postauricular or axillary adenopathy is present Skin: Skin is warm and dry. No rash noted. No diaphoresis. No erythema. No pallor. Portacath site is left upper chest and healing without evidence of infection Pscyh: Normal mood and affect. Behavior is normal. Judgment and thought content normal.   LABORATORY RESULTS: No results found for this or any previous visit (from the past 48 hour(s)).  RADIOLOGY RESULTS: No results found.  Problem List: Patient Active Problem List  Diagnosis  . Gout  . Diabetes mellitus  . Hypertension  . Hyperlipidemia  . Cancer of the ileocecal junction  . S/P right colectomy    Assessment & Plan: Portacath malfunction Reposition tubing/port    Matt B. Daphine Deutscher, MD, [facility] [phone] beeper [phone]  03/14/2012 3:00 PM

## 2012-03-15 ENCOUNTER — Other Ambulatory Visit: Payer: Self-pay | Admitting: Oncology

## 2012-03-16 ENCOUNTER — Telehealth: Payer: Self-pay | Admitting: Oncology

## 2012-03-16 ENCOUNTER — Telehealth: Payer: Self-pay | Admitting: *Deleted

## 2012-03-16 ENCOUNTER — Other Ambulatory Visit (HOSPITAL_BASED_OUTPATIENT_CLINIC_OR_DEPARTMENT_OTHER): Payer: Medicare Other | Admitting: Lab

## 2012-03-16 ENCOUNTER — Ambulatory Visit (HOSPITAL_BASED_OUTPATIENT_CLINIC_OR_DEPARTMENT_OTHER): Payer: Medicare Other

## 2012-03-16 ENCOUNTER — Ambulatory Visit (HOSPITAL_BASED_OUTPATIENT_CLINIC_OR_DEPARTMENT_OTHER): Payer: Medicare Other | Admitting: Nurse Practitioner

## 2012-03-16 ENCOUNTER — Ambulatory Visit: Payer: Medicare Other | Admitting: Nutrition

## 2012-03-16 VITALS — BP 131/73 | HR 90 | Temp 97.3°F | Resp 18 | Ht 63.75 in | Wt 194.7 lb

## 2012-03-16 DIAGNOSIS — C182 Malignant neoplasm of ascending colon: Secondary | ICD-10-CM

## 2012-03-16 DIAGNOSIS — Z5111 Encounter for antineoplastic chemotherapy: Secondary | ICD-10-CM

## 2012-03-16 DIAGNOSIS — E119 Type 2 diabetes mellitus without complications: Secondary | ICD-10-CM

## 2012-03-16 DIAGNOSIS — R911 Solitary pulmonary nodule: Secondary | ICD-10-CM | POA: Diagnosis not present

## 2012-03-16 DIAGNOSIS — C180 Malignant neoplasm of cecum: Secondary | ICD-10-CM

## 2012-03-16 LAB — COMPREHENSIVE METABOLIC PANEL (CC13)
ALT: 9 U/L (ref 0–55)
AST: 17 U/L (ref 5–34)
Creatinine: 0.9 mg/dL (ref 0.6–1.1)
Sodium: 138 mEq/L (ref 136–145)
Total Bilirubin: 0.3 mg/dL (ref 0.20–1.20)
Total Protein: 7.1 g/dL (ref 6.4–8.3)

## 2012-03-16 LAB — CBC & DIFF AND RETIC
BASO%: 0.3 % (ref 0.0–2.0)
Eosinophils Absolute: 0.2 10*3/uL (ref 0.0–0.5)
HCT: 31.5 % — ABNORMAL LOW (ref 34.8–46.6)
Immature Retic Fract: 17.1 % — ABNORMAL HIGH (ref 1.60–10.00)
LYMPH%: 30.1 % (ref 14.0–49.7)
MCHC: 31.7 g/dL (ref 31.5–36.0)
MONO#: 0.8 10*3/uL (ref 0.1–0.9)
NEUT#: 3.4 10*3/uL (ref 1.5–6.5)
NEUT%: 54.1 % (ref 38.4–76.8)
Platelets: 284 10*3/uL (ref 145–400)
Retic Ct Abs: 111.34 10*3/uL — ABNORMAL HIGH (ref 33.70–90.70)
WBC: 6.2 10*3/uL (ref 3.9–10.3)
lymph#: 1.9 10*3/uL (ref 0.9–3.3)
nRBC: 0 % (ref 0–0)

## 2012-03-16 MED ORDER — PALONOSETRON HCL INJECTION 0.25 MG/5ML
0.2500 mg | Freq: Once | INTRAVENOUS | Status: AC
  Administered 2012-03-16: 0.25 mg via INTRAVENOUS

## 2012-03-16 MED ORDER — DEXAMETHASONE SODIUM PHOSPHATE 10 MG/ML IJ SOLN
10.0000 mg | Freq: Once | INTRAMUSCULAR | Status: AC
  Administered 2012-03-16: 10 mg via INTRAVENOUS

## 2012-03-16 MED ORDER — LORAZEPAM 1 MG PO TABS
0.5000 mg | ORAL_TABLET | Freq: Once | ORAL | Status: AC
  Administered 2012-03-16: 0.5 mg via SUBLINGUAL

## 2012-03-16 MED ORDER — HEPARIN SOD (PORK) LOCK FLUSH 100 UNIT/ML IV SOLN
500.0000 [IU] | Freq: Once | INTRAVENOUS | Status: AC | PRN
  Administered 2012-03-16: 500 [IU]
  Filled 2012-03-16: qty 5

## 2012-03-16 MED ORDER — SODIUM CHLORIDE 0.9 % IJ SOLN
10.0000 mL | INTRAMUSCULAR | Status: DC | PRN
  Administered 2012-03-16: 10 mL
  Filled 2012-03-16: qty 10

## 2012-03-16 MED ORDER — OXALIPLATIN CHEMO INJECTION 100 MG/20ML
100.0000 mg/m2 | Freq: Once | INTRAVENOUS | Status: AC
  Administered 2012-03-16: 200 mg via INTRAVENOUS
  Filled 2012-03-16: qty 40

## 2012-03-16 MED ORDER — DEXTROSE 5 % IV SOLN
Freq: Once | INTRAVENOUS | Status: AC
  Administered 2012-03-16: 12:00:00 via INTRAVENOUS

## 2012-03-16 NOTE — Progress Notes (Signed)
1605-Lisa Maisie Fus, NP at chairside to assess pt.  Pt states she feels "back to normal".  Okay to d/c home per [place].  Pt has appt to see Misty Stanley before next chemo visit.  Advised to call before if any questions/problems.  Pt verbalized understanding.  Pt taken out to lobby in wheelchair with warm pack to breathe in warm air to prevent recurrence-dhp, rn

## 2012-03-16 NOTE — Progress Notes (Signed)
[name] reports doing well.  Her appetite has improved.  She did report nausea; however, her antiemetic worked well and improved her nausea.  She did experience some loose stools but states improvement with medication.  NUTRITION DIAGNOSIS:  Food and nutrition-related knowledge deficit has improved.  INTERVENTION:  I have encouraged Peggy French to continue small, frequent meals utilizing high-protein, high-calorie foods as needed to maintain lean body mass.  I have encouraged her to continue medications as needed to improve side affects.  I have answered her questions.  MONITORING/EVALUATION/GOALS:  The patient has tolerated adequate calories and protein to minimize further weight loss.  NEXT VISIT:  I will follow Peggy French as needed throughout chemotherapy.   ______________________________ Zenovia Jarred, RD, CSO, LDN Clinical Nutrition Specialist BN/MEDQ  D:  03/16/2012  T:  03/16/2012  Job:  [phone]

## 2012-03-16 NOTE — Patient Instructions (Signed)
[facility] Discharge Instructions for Patients Receiving Chemotherapy  Today you received the following chemotherapy agents Oxaliplatin  To help prevent nausea and vomiting after your treatment, we encourage you to take your nausea medication as prescribed.   If you develop nausea and vomiting that is not controlled by your nausea medication, call the clinic. If it is after clinic hours your family physician or the after hours number for the clinic or go to the Emergency Department.   BELOW ARE SYMPTOMS THAT SHOULD BE REPORTED IMMEDIATELY:  *FEVER GREATER THAN 100.5 F  *CHILLS WITH OR WITHOUT FEVER  NAUSEA AND VOMITING THAT IS NOT CONTROLLED WITH YOUR NAUSEA MEDICATION  *UNUSUAL SHORTNESS OF BREATH  *UNUSUAL BRUISING OR BLEEDING  TENDERNESS IN MOUTH AND THROAT WITH OR WITHOUT PRESENCE OF ULCERS  *URINARY PROBLEMS  *BOWEL PROBLEMS  UNUSUAL RASH Items with * indicate a potential emergency and should be followed up as soon as possible.  One of the nurses will contact you 24 hours after your treatment. Please let the nurse know about any problems that you may have experienced. Feel free to call the clinic you have any questions or concerns. The clinic phone number is [phone].   I have been informed and understand all the instructions given to me. I know to contact the clinic, my physician, or go to the Emergency Department if any problems should occur. I do not have any questions at this time, but understand that I may call the clinic during office hours   should I have any questions or need assistance in obtaining follow up care.    __________________________________________  _____________  __________ Signature of Patient or Authorized Representative            Date                   Time    __________________________________________ Nurse's Signature    

## 2012-03-16 NOTE — Telephone Encounter (Signed)
Per staff message and POF I have scheduled appts. JW  

## 2012-03-16 NOTE — Addendum Note (Signed)
Addended by: Nelly Laurence H on: 03/16/2012 04:00 PM   Modules accepted: Orders

## 2012-03-16 NOTE — Progress Notes (Signed)
OFFICE PROGRESS NOTE  Interval history:  Peggy French returns as scheduled. She completed cycle 1 CAPOX beginning 02/24/2012. Following treatment she noted pain and swelling at the left antecubital region IV site. She felt a "knot". The pain and swelling have resolved. She had nausea beginning one to 2 days after the oxaliplatin. The nausea lasted for several days. She had no vomiting. She has had a few loose stools. She takes Lomotil as needed. Appetite was diminished following treatment. She had intermittent headaches and also an episode of dizziness. She denies mouth sores. She avoided cold exposure for 5 days following treatment. She denies persistent neuropathy symptoms. She notes scattered areas of hyperpigmentation over the fingertips.   Objective: Blood pressure 131/73, pulse 90, temperature 97.3 F (36.3 C), temperature source Oral, resp. rate 18, height 5' 3.75" (1.619 m), weight 194 lb 11.2 oz (88.315 kg).  Oropharynx is without thrush or ulceration. Lungs are clear. Regular cardiac rhythm. Port-A-Cath site is without erythema. Abdomen is soft and nontender. No hepatomegaly. Extremities are without edema. Area of resolving superficial phlebitis at the left antecubital region. The area is nontender and without erythema. The left arm is without edema. Vibratory sense is mildly decreased over the fingertips per tuning fork exam. Palms are without erythema. There are scattered areas of hyperpigmentation.  Lab Results: Lab Results  Component Value Date   WBC 6.2 03/16/2012   HGB 10.0* 03/16/2012   HCT 31.5* 03/16/2012   MCV 68.2* 03/16/2012   PLT 284 03/16/2012    Chemistry:    Chemistry      Component Value Date/Time   NA 139 02/29/2012 1051   K 3.7 02/29/2012 1051   CL 97 02/24/2012 1120   CO2 28 02/24/2012 1120   BUN 7 02/24/2012 1120   CREATININE 0.65 02/24/2012 1120   CREATININE 0.91 12/02/2011 1159      Component Value Date/Time   CALCIUM 9.1 02/24/2012 1120   ALKPHOS 88 02/24/2012 1120    AST 73* 02/24/2012 1120   ALT 13 02/24/2012 1120   BILITOT 0.3 02/24/2012 1120       Studies/Results: Ct Chest Wo Contrast  02/21/2012  *RADIOLOGY REPORT*  Clinical Data: Colon cancer.  CT CHEST WITHOUT CONTRAST  Technique:  Multidetector CT imaging of the chest was performed following the standard protocol without IV contrast.  Comparison: CT abdomen pelvis 12/03/2011.  Findings: Thyroid appears asymmetrically enlarged.  No pathologically enlarged mediastinal or axillary lymph nodes.  Hilar regions are difficult to definitively evaluate without IV contrast. Scattered coronary artery calcification.  Heart size normal.  No pericardial effusion.  A 5 x 6 mm nodule in the inferior left lower lobe (image 40) appears larger than on 12/03/2011, at which time it measured approximately 3 mm.  Scattered parenchymal scarring at the lung bases.  No pleural fluid.  Airway is unremarkable.  Incidental imaging of the upper abdomen shows no acute findings. No worrisome lytic or sclerotic lesions.  Degenerative changes are seen in the spine.  IMPRESSION:  Left lower lobe nodule which, in retrospect, appears larger than on 12/03/2011. Metastatic disease cannot be excluded.  Original Report Authenticated By: Reyes Ivan, M.D.   Ir Cv Line Injection  02/24/2012  *RADIOLOGY REPORT*  Clinical history:Recently placed Port-A-Cath.  The port is difficult to flush.  PROCEDURE(S): PORT-A-CATH INJECTION.  Physician: Rachelle Hora. Henn, MD  Contrast:  8 ml Omnipaque 350  Fluoroscopy time: 1.5 minutes  Procedure:The port was already accessed when the patient arrived to radiology.  It was  very difficult to inject contrast through the port.  Fluoroscopy demonstrated that the catheter tip is positioned near the cavoatrial junction.  However, there is a persistent kink in the catheter near the port hub.  The port needle was removed and tried to manipulate the tube near the hub.  The kink in the catheter did not change.  The catheter was  accessed again and additional contrast was injected.  Catheter was flushed with normal saline and heparin at the end of the procedure.  Findings:There is a left subclavian Port-A-Cath with the tip near the cavoatrial junction.  There appears to be a persistent kink in the catheter near the port hub.  There is preferential reflux of contrast into the SVC during the port injection.  Complications: None  Impression: There is concern for a kink in the catheter near the port. This may be the cause of the difficult flushing.  In addition, there is reflux of contrast into the upper SVC during the port injection.  Findings raise concern for a fibrin sheath in this area although this would be unusual in a recently placed catheter. Alternatively, the catheter tip may be up against the vein wall.  Original Report Authenticated By: Richarda Overlie, M.D.   Dg Chest Port 1 View  02/29/2012  *RADIOLOGY REPORT*  Clinical Data: Port-A-Cath placement.  PORTABLE CHEST - 1 VIEW  Comparison: 02/22/2012  Findings: Left Port-A-Cath revision.  No kink visualized within the Port-A-Cath catheter.  Tip is in the lower SVC.  No pneumothorax. Lungs are clear.  No effusions.  Heart is normal size.  IMPRESSION: Left Port-A-Cath in place with the tip in the lower SVC.  No pneumothorax.   Original Report Authenticated By: Cyndie Chime, M.D.    Dg Chest Port 1 View  02/22/2012  *RADIOLOGY REPORT*  Clinical Data: Port-A-Cath placement.  PORTABLE CHEST - 1 VIEW  Comparison: CT chest 02/21/2012.  Findings: The patient has a new left subclavian Port-A-Cath. Catheter tubing is intact.  The tip projects over the superior cavoatrial junction.  There is no pneumothorax.  Heart size is normal.  The lungs appear clear.  IMPRESSION: Port-A-Cath tip projects in the superior cavoatrial junction. Lateral view is required to confirm position.  No pneumothorax.  Original Report Authenticated By: Bernadene Bell. D'ALESSIO, M.D.   Dg C-arm 1-60 Min-no  Report  02/22/2012  CLINICAL DATA: port a cath placement   C-ARM 1-60 MINUTES  Fluoroscopy was utilized by the requesting physician.  No radiographic  interpretation.      Medications: I have reviewed the patient's current medications.  Assessment/Plan:  1. 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 She began adjuvant CAPOX chemotherapy on 02/24/2012. 2. Delayed nausea following cycle 1 CAPOX. We will adjust the premedication regimen to include Aloxi. 3. Diabetes. 4. Microcytic anemia. Hemoglobin is stable. She continues oral iron. 5. Remote history of a "colon tumor" removed endoscopically in 1980. 6. Left lower lung nodule noted on a CT 02/21/2012, slightly larger than on a CT 12/03/2011. Plan for a three-month interval CT. 7. Status post Port-A-Cath placement 02/22/2012. Status post Port-A-Cath repositioning 03/14/2012.  Disposition-Ms. Augspurger appears stable. Plan to proceed with cycle 2 CAPOX today as scheduled. She will return for a followup visit and cycle 3 in 3 weeks. She will contact the office the interim with any problems.  Plan reviewed with Dr. Truett Perna.  Peggy French ANP/GNP-BC

## 2012-03-16 NOTE — Telephone Encounter (Signed)
gve the pt her sept,oct 2013 appt calendar. Pt is aware that her chemo will be added. Sent michelle a staff message

## 2012-03-16 NOTE — Progress Notes (Addendum)
1510-Pt returned to infusion area via wheelchair complaining of shortness of breath, accompanied by husband.  BP elevated, HR stable, oxygen 100% on 2 liters of oxygen applied in infusion area-dhp, rn 1512- Tiana Loft, PA at chairside to assess patient.  Warm packs given for her to breathe in warm air.  Wrapped in warm blanket.  Pt continues to feel ShOB.  Sats stable-dhp, rn 1517-BP 132/87, HR 106, Sats 100% on 2L nasal canula.  Pt states she is beginning to feel some relief from [employer].  VO for Lorazepam 0.5mg  sublingual x 1 received from [employer], PA-dhp, rn 1520-Lisa Maisie Fus, NP at chairside to assess pt.  Pt states she is feeling better, still some ShOB-dhp, rn 1526-Lorazepam administered-dhp, rn 1530-Pt states ShOB has resolved completely.  BP 136/92, HR 99, sats 100% on 2L nasal canula-dhp, rn 1540-BP 125/84, HR 100, sats 99% on RA-dhp, rn 1550-Pt states she is feeling okay.  Spoke with Lonna Cobb, NP.  She will come back over to reassess patient before discharging home-dhp, rn

## 2012-03-20 ENCOUNTER — Telehealth: Payer: Self-pay | Admitting: *Deleted

## 2012-03-20 NOTE — Telephone Encounter (Addendum)
PT. IS VERY WEAK. THERE HAS BEEN SOME "LIGHTHEADNESS". FLUID INTAKE IN THE PAST 24 HOURS HAS BEEN 32 OUNCES. SLIGHT NAUSEA BUT NO VOMITING. PT. IS EATING SMALL AMOUNTS. SOME TIGHTNESS IN HER JAW. SHE HAD THREE DARK STOOLS YESTERDAY. PT.TOOK LOMOTIL. SHE HAD ANOTHER DARK STOOL LAST NIGHT. NO STOOLS TODAY. PT. IS ON IRON TWICE A DAY. SHE DOES NOT THINK THERE IS BLOOD IN HER STOOL. THERE IS SOME NUMBNESS IN HER HANDS AND FEET. VERBAL ORDER AND READ BACK TO DR.SHERRILL-PT. NEEDS TO FORCE FLUIDS TO 64 OUNCES IN A 24 HOUR PERIOD. THE TIGHTNESS IN HER JAW AND NUMBNESS IN HER HANDS AND FEET ARE FROM THE OXALIPLATIN.. DOES PT. FEEL SHE NEEDS TO BE SEEN TODAY? NOTIFIED PT. OF THE ABOVE INFORMATION. SHE VOICES UNDERSTANDING. PT. DID NOT FEEL SHE NEEDED TO BE SEEN TODAY. SHE HAS HAD A LOOSE STOOL. INSTRUCTED PT. TO TAKE A LOMOTIL. SHE WILL CALL TOMORROW WITH AN UPDATE. ALSO INSTRUCTED PT. IF HER CONDITION WORSEN TO CALL THIS OFFICE OR GO TO THE EMERGENCY ROOM. SHE VOICES UNDERSTANDING.

## 2012-03-21 ENCOUNTER — Telehealth: Payer: Self-pay | Admitting: *Deleted

## 2012-03-21 NOTE — Telephone Encounter (Signed)
Call from pt with update: she feels about the same. Has had #3 loose stools in the past 24 hours. Has been pushing PO fluids. Does not feel up to coming into the office. Does not feel like she needs to be seen. Stated she can "push through". Instructed her to call office for diarrhea not controlled with Lomotil. Pt voiced understanding. She stated she will call office if she thinks she needs to see the doctor.

## 2012-03-23 ENCOUNTER — Telehealth: Payer: Self-pay | Admitting: *Deleted

## 2012-03-23 NOTE — Telephone Encounter (Signed)
Received call from pt reporting "slightly SOB, felt like I nearly fainted, having diarrhea.  Called and spoke with pt; she reported that diarrhea has stopped and has been taking Imodium.  Also pt has ate soup/cracker/biscuit and "has been nibbling on other foods"  Feels she is doing better and able to take in food/fluids.  Denies pain, N/V; encouraged to continue to push fluids and take anti-emetic if needed.  Pt verbalized understanding and confirmed appt for 04/06/12.

## 2012-04-04 ENCOUNTER — Other Ambulatory Visit: Payer: Self-pay | Admitting: Oncology

## 2012-04-06 ENCOUNTER — Ambulatory Visit: Payer: Medicare Other | Admitting: Nutrition

## 2012-04-06 ENCOUNTER — Ambulatory Visit (HOSPITAL_BASED_OUTPATIENT_CLINIC_OR_DEPARTMENT_OTHER): Payer: Medicare Other | Admitting: Nurse Practitioner

## 2012-04-06 ENCOUNTER — Other Ambulatory Visit (HOSPITAL_BASED_OUTPATIENT_CLINIC_OR_DEPARTMENT_OTHER): Payer: Medicare Other | Admitting: Lab

## 2012-04-06 ENCOUNTER — Telehealth: Payer: Self-pay | Admitting: *Deleted

## 2012-04-06 ENCOUNTER — Ambulatory Visit (HOSPITAL_BASED_OUTPATIENT_CLINIC_OR_DEPARTMENT_OTHER): Payer: Medicare Other

## 2012-04-06 ENCOUNTER — Telehealth: Payer: Self-pay | Admitting: Oncology

## 2012-04-06 VITALS — BP 151/80 | HR 85 | Temp 97.4°F | Resp 18 | Ht 63.75 in | Wt 194.7 lb

## 2012-04-06 DIAGNOSIS — C180 Malignant neoplasm of cecum: Secondary | ICD-10-CM | POA: Diagnosis not present

## 2012-04-06 DIAGNOSIS — R911 Solitary pulmonary nodule: Secondary | ICD-10-CM

## 2012-04-06 DIAGNOSIS — C182 Malignant neoplasm of ascending colon: Secondary | ICD-10-CM | POA: Diagnosis not present

## 2012-04-06 DIAGNOSIS — D509 Iron deficiency anemia, unspecified: Secondary | ICD-10-CM | POA: Diagnosis not present

## 2012-04-06 DIAGNOSIS — Z5111 Encounter for antineoplastic chemotherapy: Secondary | ICD-10-CM | POA: Diagnosis not present

## 2012-04-06 DIAGNOSIS — R110 Nausea: Secondary | ICD-10-CM

## 2012-04-06 LAB — COMPREHENSIVE METABOLIC PANEL (CC13)
AST: 21 U/L (ref 5–34)
Alkaline Phosphatase: 108 U/L (ref 40–150)
BUN: 10 mg/dL (ref 7.0–26.0)
Calcium: 9.2 mg/dL (ref 8.4–10.4)
Chloride: 101 mEq/L (ref 98–107)
Creatinine: 0.9 mg/dL (ref 0.6–1.1)
Total Bilirubin: 0.4 mg/dL (ref 0.20–1.20)

## 2012-04-06 LAB — CBC WITH DIFFERENTIAL/PLATELET
BASO%: 1 % (ref 0.0–2.0)
EOS%: 4.3 % (ref 0.0–7.0)
HCT: 34.1 % — ABNORMAL LOW (ref 34.8–46.6)
LYMPH%: 38.8 % (ref 14.0–49.7)
MCH: 22.9 pg — ABNORMAL LOW (ref 25.1–34.0)
MCHC: 32.3 g/dL (ref 31.5–36.0)
MCV: 71 fL — ABNORMAL LOW (ref 79.5–101.0)
MONO%: 13.2 % (ref 0.0–14.0)
NEUT%: 42.7 % (ref 38.4–76.8)
Platelets: 255 10*3/uL (ref 145–400)
RBC: 4.8 10*6/uL (ref 3.70–5.45)
nRBC: 0 % (ref 0–0)

## 2012-04-06 MED ORDER — OXALIPLATIN CHEMO INJECTION 100 MG/20ML
100.0000 mg/m2 | Freq: Once | INTRAVENOUS | Status: AC
  Administered 2012-04-06: 200 mg via INTRAVENOUS
  Filled 2012-04-06: qty 40

## 2012-04-06 MED ORDER — DEXTROSE 5 % IV SOLN
Freq: Once | INTRAVENOUS | Status: AC
  Administered 2012-04-06: 12:00:00 via INTRAVENOUS

## 2012-04-06 MED ORDER — SODIUM CHLORIDE 0.9 % IJ SOLN
10.0000 mL | INTRAMUSCULAR | Status: DC | PRN
  Administered 2012-04-06: 10 mL
  Filled 2012-04-06: qty 10

## 2012-04-06 MED ORDER — DEXAMETHASONE SODIUM PHOSPHATE 10 MG/ML IJ SOLN
10.0000 mg | Freq: Once | INTRAMUSCULAR | Status: AC
  Administered 2012-04-06: 10 mg via INTRAVENOUS

## 2012-04-06 MED ORDER — HEPARIN SOD (PORK) LOCK FLUSH 100 UNIT/ML IV SOLN
500.0000 [IU] | Freq: Once | INTRAVENOUS | Status: AC | PRN
  Administered 2012-04-06: 500 [IU]
  Filled 2012-04-06: qty 5

## 2012-04-06 MED ORDER — PALONOSETRON HCL INJECTION 0.25 MG/5ML
0.2500 mg | Freq: Once | INTRAVENOUS | Status: AC
  Administered 2012-04-06: 0.25 mg via INTRAVENOUS

## 2012-04-06 NOTE — Patient Instructions (Addendum)
Oxaliplatin Injection What is this medicine? OXALIPLATIN (ox AL i PLA tin) is a chemotherapy drug. It targets fast dividing cells, like cancer cells, and causes these cells to die. This medicine is used to treat cancers of the colon and rectum, and many other cancers. This medicine may be used for other purposes; ask your health care provider or pharmacist if you have questions. What should I tell my health care provider before I take this medicine? They need to know if you have any of these conditions: -kidney disease -an unusual or allergic reaction to oxaliplatin, other chemotherapy, other medicines, foods, dyes, or preservatives -pregnant or trying to get pregnant -breast-feeding How should I use this medicine? This drug is given as an infusion into a vein. It is administered in a hospital or clinic by a specially trained health care professional. Talk to your pediatrician regarding the use of this medicine in children. Special care may be needed. Overdosage: If you think you have taken too much of this medicine contact a poison control center or emergency room at once. NOTE: This medicine is only for you. Do not share this medicine with others. What if I miss a dose? It is important not to miss a dose. Call your doctor or health care professional if you are unable to keep an appointment. What may interact with this medicine? -medicines to increase blood counts like filgrastim, pegfilgrastim, sargramostim -probenecid -some antibiotics like amikacin, gentamicin, neomycin, polymyxin B, streptomycin, tobramycin -zalcitabine Talk to your doctor or health care professional before taking any of these medicines: -acetaminophen -aspirin -ibuprofen -ketoprofen -naproxen This list may not describe all possible interactions. Give your health care provider a list of all the medicines, herbs, non-prescription drugs, or dietary supplements you use. Also tell them if you smoke, drink alcohol, or use  illegal drugs. Some items may interact with your medicine. What should I watch for while using this medicine? Your condition will be monitored carefully while you are receiving this medicine. You will need important blood work done while you are taking this medicine. This medicine can make you more sensitive to cold. Do not drink cold drinks or use ice. Cover exposed skin before coming in contact with cold temperatures or cold objects. When out in cold weather wear warm clothing and cover your mouth and nose to warm the air that goes into your lungs. Tell your doctor if you get sensitive to the cold. This drug may make you feel generally unwell. This is not uncommon, as chemotherapy can affect healthy cells as well as cancer cells. Report any side effects. Continue your course of treatment even though you feel ill unless your doctor tells you to stop. In some cases, you may be given additional medicines to help with side effects. Follow all directions for their use. Call your doctor or health care professional for advice if you get a fever, chills or sore throat, or other symptoms of a cold or flu. Do not treat yourself. This drug decreases your body's ability to fight infections. Try to avoid being around people who are sick. This medicine may increase your risk to bruise or bleed. Call your doctor or health care professional if you notice any unusual bleeding. Be careful brushing and flossing your teeth or using a toothpick because you may get an infection or bleed more easily. If you have any dental work done, tell your dentist you are receiving this medicine. Avoid taking products that contain aspirin, acetaminophen, ibuprofen, naproxen, or ketoprofen unless instructed   by your doctor. These medicines may hide a fever. Do not become pregnant while taking this medicine. Women should inform their doctor if they wish to become pregnant or think they might be pregnant. There is a potential for serious side  effects to an unborn child. Talk to your health care professional or pharmacist for more information. Do not breast-feed an infant while taking this medicine. Call your doctor or health care professional if you get diarrhea. Do not treat yourself. What side effects may I notice from receiving this medicine? Side effects that you should report to your doctor or health care professional as soon as possible: -allergic reactions like skin rash, itching or hives, swelling of the face, lips, or tongue -low blood counts - This drug may decrease the number of white blood cells, red blood cells and platelets. You may be at increased risk for infections and bleeding. -signs of infection - fever or chills, cough, sore throat, pain or difficulty passing urine -signs of decreased platelets or bleeding - bruising, pinpoint red spots on the skin, black, tarry stools, nosebleeds -signs of decreased red blood cells - unusually weak or tired, fainting spells, lightheadedness -breathing problems -chest pain, pressure -cough -diarrhea -jaw tightness -mouth sores -nausea and vomiting -pain, swelling, redness or irritation at the injection site -pain, tingling, numbness in the hands or feet -problems with balance, talking, walking -redness, blistering, peeling or loosening of the skin, including inside the mouth -trouble passing urine or change in the amount of urine Side effects that usually do not require medical attention (report to your doctor or health care professional if they continue or are bothersome): -changes in vision -constipation -hair loss -loss of appetite -metallic taste in the mouth or changes in taste -stomach pain This list may not describe all possible side effects. Call your doctor for medical advice about side effects. You may report side effects to FDA at 1-800-FDA-1088. Where should I keep my medicine? This drug is given in a hospital or clinic and will not be stored at home. NOTE:  This sheet is a summary. It may not cover all possible information. If you have questions about this medicine, talk to your doctor, pharmacist, or health care provider.  2012, [employer]. (01/23/2008 5:22:47 PM) 

## 2012-04-06 NOTE — Telephone Encounter (Signed)
appts made and printed for pt  °

## 2012-04-06 NOTE — Telephone Encounter (Signed)
MD review of CMet results. Increase K+ to 20 meq bid. Will recheck at next visit. Left info on home voice mail with request to call back to confirm.

## 2012-04-06 NOTE — Progress Notes (Signed)
OFFICE PROGRESS NOTE  Interval history:  Peggy French returns as scheduled. She completed cycle 2 CAPOX beginning 03/16/2012. After leaving the infusion room, also at the cancer Center, she developed shortness of breath, wheezing and felt that her throat was closing. She returned to the infusion area and was given warm blankets, a warm pack and inhaled warm air with complete resolution of the symptoms.   She had one episode of nausea following treatment. No vomiting. She developed loose stools while taking the Xeloda estimating no more than 2 stools per day. She took Lomotil as needed with good control. She has had no diarrhea for the past week. She has mild intermittent numbness/tingling in the fingertips, toes and heels. This does not interfere with her activity.  She reports a recent "gout" flare at the right great toe. Symptoms are improving.   Objective: Blood pressure 151/80, pulse 85, temperature 97.4 F (36.3 C), temperature source Oral, resp. rate 18, height 5' 3.75" (1.619 m), weight 194 lb 11.2 oz (88.315 kg).  Oropharynx is without thrush or ulceration. Lungs are clear. Regular cardiac rhythm. Port-A-Cath site is without erythema. Abdomen is soft and nontender. No hepatomegaly. Extremities are without edema. Calves are soft and nontender. Vibratory sense is mildly decreased over the fingertips per tuning fork exam. Palms are without erythema. Scattered areas of hyperpigmentation over the palms. The right great toe is nontender and without erythema.  Lab Results: Lab Results  Component Value Date   WBC 4.2 04/06/2012   HGB 11.0* 04/06/2012   HCT 34.1* 04/06/2012   MCV 71.0* 04/06/2012   PLT 255 04/06/2012    Chemistry:    Chemistry      Component Value Date/Time   NA 138 03/16/2012 1007   NA 139 02/29/2012 1051   K 3.5 03/16/2012 1007   K 3.7 02/29/2012 1051   CL 102 03/16/2012 1007   CL 97 02/24/2012 1120   CO2 24 03/16/2012 1007   CO2 28 02/24/2012 1120   BUN 12.0 03/16/2012 1007   BUN 7 02/24/2012 1120   CREATININE 0.9 03/16/2012 1007   CREATININE 0.65 02/24/2012 1120   CREATININE 0.91 12/02/2011 1159      Component Value Date/Time   CALCIUM 9.1 02/24/2012 1120   ALKPHOS 112 03/16/2012 1007   ALKPHOS 88 02/24/2012 1120   AST 17 03/16/2012 1007   AST 73* 02/24/2012 1120   ALT 9 03/16/2012 1007   ALT 13 02/24/2012 1120   BILITOT 0.30 03/16/2012 1007   BILITOT 0.3 02/24/2012 1120       Studies/Results: No results found.  Medications: I have reviewed the patient's current medications.  Assessment/Plan:  1. 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 She began adjuvant CAPOX chemotherapy on 02/24/2012. 2. Delayed nausea following cycle 1 CAPOX. Aloxi was added beginning with cycle 2 with improvement. 3. Diabetes. 4. Microcytic anemia. Hemoglobin is stable. She continues oral iron. 5. Remote history of a "colon tumor" removed endoscopically in 1980. 6. Left lower lung nodule noted on a CT 02/21/2012, slightly larger than on a CT 12/03/2011. Plan for a three-month interval CT. 7. Status post Port-A-Cath placement 02/22/2012. Status post Port-A-Cath repositioning 03/14/2012. 8. Probable acute laryngopharyngeal dysesthesia following cycle 2 CAPOX. Symptoms resolved with warming. 9. Question gout flare at the right great toe. Symptoms improving. 10. Mild intermittent numbness/tingling in the fingertips and toes. 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  Disposition-[name] appears stable. She likely experienced acute laryngopharyngeal dysesthesia following cycle 2 CAPOX. Plan to  proceed with cycle 3 today as scheduled. We will increase the oxaliplatin infusion time to 4 hours beginning with cycle 3. She will return for a followup visit and cycle 4 CAPOX in 3 weeks. She will contact the office in the interim with any problems.  Plan reviewed with Dr. Truett Perna.  Peggy French ANP/GNP-BC

## 2012-04-06 NOTE — Progress Notes (Signed)
Peggy French is receiving chemotherapy today.  She reports her appetite has improved.  She did have some nausea after her 1st treatment, but she took her nausea medicines and she ate small amounts often, as I had previously instructed her, and she says that she did pretty well.  Her weight is stable at 194.7 pounds 09/26, from 194.7 pounds on September 5th.  NUTRITION DIAGNOSIS:  Food and nutrition-related knowledge deficit improved.  INTERVENTION:  I educated the patient to continue small, frequent meals utilizing higher-protein foods as needed to maintain lean body mass.  I have reviewed strategies for dealing with nausea after treatment.  MONITORING/EVALUATION/GOALS:  The patient is tolerating calories and protein to minimize weight loss.  NEXT VISIT:  Thursday, November 7th, during chemotherapy.   ______________________________ Zenovia Jarred, RD, CSO, LDN Clinical Nutrition Specialist BN/MEDQ  D:  04/06/2012  T:  04/06/2012  Job:  [id number]

## 2012-04-20 ENCOUNTER — Telehealth: Payer: Self-pay | Admitting: *Deleted

## 2012-04-20 NOTE — Telephone Encounter (Signed)
Verbal order received and read back from Dr. Truett Perna for pt. To hold xeloda until her feet are better.  Take something for pain and continue moisturizing them.  Called patient with these orders.  She will be seen on the day she is to resume and expressed that she does not resume until she's been seen and feet evaluated.    Peggy French says she is taking tylenol which is helping and has rubbed more cream on since we spoke.  This nurse instructed her not to rub and to gently apply the moisturizer to coat feet.  Wear loose fitting socks and shoes.  Do not use hot water for bath or shower but tepid.  Asked that she call before next Thursday if feet crack, blister, swell or walking worsens with increased  Pain.

## 2012-04-20 NOTE — Telephone Encounter (Signed)
Patient called asking what to do about feet.  Due for cycle 4 XELOX on 04-27-2012.  Noticed a week ago feet sore and red but today it is worse.  "I Can't stand and place any pressure on my feet."  Today is the first rest day for the xeloda.  Applying udder cream 2 - 3 times a day but no relief.  Denies problem with hands.  No open skin tears and no fever.  Will notify providers.

## 2012-04-21 ENCOUNTER — Other Ambulatory Visit: Payer: Self-pay | Admitting: *Deleted

## 2012-04-21 NOTE — Telephone Encounter (Signed)
Pharmacy calling to refill Xeloda. Next cycle due 10/17 (has OV) with oxaliplatin. Currently on hold as of 101/10 due to feet being red and sore.

## 2012-04-23 ENCOUNTER — Other Ambulatory Visit: Payer: Self-pay | Admitting: Oncology

## 2012-04-25 ENCOUNTER — Other Ambulatory Visit: Payer: Self-pay | Admitting: *Deleted

## 2012-04-25 DIAGNOSIS — C189 Malignant neoplasm of colon, unspecified: Secondary | ICD-10-CM

## 2012-04-25 MED ORDER — CAPECITABINE 500 MG PO TABS: 1000.0000 mg | ORAL_TABLET | Freq: Two times a day (BID) | ORAL | Status: DC

## 2012-04-26 ENCOUNTER — Other Ambulatory Visit: Payer: Self-pay | Admitting: Oncology

## 2012-04-27 ENCOUNTER — Telehealth: Payer: Self-pay | Admitting: Oncology

## 2012-04-27 ENCOUNTER — Telehealth: Payer: Self-pay | Admitting: *Deleted

## 2012-04-27 ENCOUNTER — Ambulatory Visit: Payer: Medicare Other

## 2012-04-27 ENCOUNTER — Other Ambulatory Visit (HOSPITAL_BASED_OUTPATIENT_CLINIC_OR_DEPARTMENT_OTHER): Payer: Medicare Other | Admitting: Lab

## 2012-04-27 ENCOUNTER — Ambulatory Visit (HOSPITAL_BASED_OUTPATIENT_CLINIC_OR_DEPARTMENT_OTHER): Payer: Medicare Other | Admitting: Nurse Practitioner

## 2012-04-27 VITALS — BP 138/88 | HR 88 | Temp 97.0°F | Resp 20 | Ht 64.0 in | Wt 196.7 lb

## 2012-04-27 DIAGNOSIS — D509 Iron deficiency anemia, unspecified: Secondary | ICD-10-CM

## 2012-04-27 DIAGNOSIS — R079 Chest pain, unspecified: Secondary | ICD-10-CM | POA: Diagnosis not present

## 2012-04-27 DIAGNOSIS — C180 Malignant neoplasm of cecum: Secondary | ICD-10-CM | POA: Diagnosis not present

## 2012-04-27 DIAGNOSIS — R911 Solitary pulmonary nodule: Secondary | ICD-10-CM | POA: Diagnosis not present

## 2012-04-27 DIAGNOSIS — L270 Generalized skin eruption due to drugs and medicaments taken internally: Secondary | ICD-10-CM

## 2012-04-27 DIAGNOSIS — C182 Malignant neoplasm of ascending colon: Secondary | ICD-10-CM

## 2012-04-27 DIAGNOSIS — R069 Unspecified abnormalities of breathing: Secondary | ICD-10-CM | POA: Diagnosis not present

## 2012-04-27 LAB — CBC WITH DIFFERENTIAL/PLATELET
Basophils Absolute: 0 10*3/uL (ref 0.0–0.1)
EOS%: 3.9 % (ref 0.0–7.0)
HCT: 32.8 % — ABNORMAL LOW (ref 34.8–46.6)
HGB: 10.6 g/dL — ABNORMAL LOW (ref 11.6–15.9)
LYMPH%: 40 % (ref 14.0–49.7)
MCH: 24.1 pg — ABNORMAL LOW (ref 25.1–34.0)
NEUT%: 41.1 % (ref 38.4–76.8)
Platelets: 185 10*3/uL (ref 145–400)
lymph#: 1.9 10*3/uL (ref 0.9–3.3)

## 2012-04-27 NOTE — Telephone Encounter (Signed)
Per staff phone call and POF I have scheduled appts. JMW  

## 2012-04-27 NOTE — Progress Notes (Signed)
OFFICE PROGRESS NOTE  Interval history:  Peggy French returns as scheduled. She completed cycle #3 CAPOX beginning 04/06/2012. She did not experience acute laryngopharyngeal dysesthesia with cycle 3. She had a single episode of nausea with a small amount of vomiting. She developed loose stools. She took Lomotil and the loose stools resolved. She noted increased numbness in the hands and feet within several days of the oxaliplatin. The numbness slowly resolved. She denies any numbness in the hands or feet today.  She contacted the office on 04/20/2012 to report bilateral foot pain and redness. She continues to note the pain and redness. It is beginning to improve. There has been no skin breakdown. She reports that the hands have been mildly red and mildly tender also.  For the past 4-5 days she has been experiencing intermittent exertional chest pain and dyspnea. The chest pain and dyspnea occur when she is walking up any sort of "incline" or steps. She rates the pain at a level 6. The pain and dyspnea improve but do not completely resolve with rest. She has periodically noted diaphoresis when the chest pain and dyspnea occur. She denies nausea. She thinks the pain may radiate into the left arm at times. She has not taken anything for the symptoms.   Objective: Blood pressure 138/88, pulse 88, temperature 97 F (36.1 C), temperature source Oral, resp. rate 20, height 5\' 4"  (1.626 m), weight 196 lb 11.2 oz (89.223 kg).  Well appearing female in no acute distress. Oropharynx is without thrush or ulceration. Lungs are clear. No respiratory distress. Regular cardiac rhythm. Port-A-Cath site is without erythema. Abdomen is soft and nontender. No hepatomegaly. Extremities are without edema. Calves are soft and nontender. Vibratory sense is mildly decreased over the fingertips per tuning fork exam. The palms and soles are erythematous and tender. There is no skin breakdown. Scattered areas of  hyperpigmentation noted over the palms and soles.  Lab Results: Lab Results  Component Value Date   WBC 4.6 04/27/2012   HGB 10.6* 04/27/2012   HCT 32.8* 04/27/2012   MCV 74.7* 04/27/2012   PLT 185 04/27/2012    Chemistry:    Chemistry      Component Value Date/Time   NA 138 04/06/2012 1025   NA 139 02/29/2012 1051   K 2.9* 04/06/2012 1025   K 3.7 02/29/2012 1051   CL 101 04/06/2012 1025   CL 97 02/24/2012 1120   CO2 25 04/06/2012 1025   CO2 28 02/24/2012 1120   BUN 10.0 04/06/2012 1025   BUN 7 02/24/2012 1120   CREATININE 0.9 04/06/2012 1025   CREATININE 0.65 02/24/2012 1120   CREATININE 0.91 12/02/2011 1159      Component Value Date/Time   CALCIUM 9.2 04/06/2012 1025   CALCIUM 9.1 02/24/2012 1120   ALKPHOS 108 04/06/2012 1025   ALKPHOS 88 02/24/2012 1120   AST 21 04/06/2012 1025   AST 73* 02/24/2012 1120   ALT 11 04/06/2012 1025   ALT 13 02/24/2012 1120   BILITOT 0.40 04/06/2012 1025   BILITOT 0.3 02/24/2012 1120       Studies/Results: No results found.  Medications: I have reviewed the patient's current medications.  Assessment/Plan:  1. 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 She began adjuvant CAPOX chemotherapy on 02/24/2012. She has completed 3 cycles. 2. Delayed nausea following cycle 1 CAPOX. Aloxi was added beginning with cycle 2 with improvement. 3. Diabetes. 4. Microcytic anemia. Hemoglobin is stable. She continues oral iron. 5.  Remote history of a "colon tumor" removed endoscopically in 1980. 6. Left lower lung nodule noted on a CT 02/21/2012, slightly larger than on a CT 12/03/2011. Plan for a three-month interval CT. 7. Status post Port-A-Cath placement 02/22/2012. Status post Port-A-Cath repositioning 03/14/2012. 8. Probable acute laryngopharyngeal dysesthesia following cycle 2 CAPOX. Symptoms resolved with warming.  9. Question gout flare at the right great toe. Symptoms improving. 10. 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 She has no numbness at present. 11. Exertion-related chest pain and dyspnea over the past 4-5 days. She is followed by Dr. Rennis Golden. She had a cardiac catheterization in September 2012 with no significant coronary artery disease. 12. Hand-foot syndrome secondary to Xeloda.  Disposition-Peggy French has developed hand-foot syndrome related to Xeloda. Palms and soles continue to be erythematous and tender. We will hold cycle 4 CAPOX today and reevaluate in one week with plans to proceed with cycle 4 at that time with a dose reduction of the Xeloda if the hand foot symptoms are better.  She is experiencing exertional related chest pain and dyspnea. We contacted Dr. Blanchie Dessert office. She will be seen today at 4:00.  She will return to our office in one week for a followup visit and cycle 4 CAPOX as outlined above.   Plan reviewed with Dr. Cyndie Chime.  Lonna Cobb ANP/GNP-BC

## 2012-04-27 NOTE — Telephone Encounter (Signed)
appts made and printed for pt aom °

## 2012-05-02 DIAGNOSIS — R0602 Shortness of breath: Secondary | ICD-10-CM | POA: Diagnosis not present

## 2012-05-02 DIAGNOSIS — R079 Chest pain, unspecified: Secondary | ICD-10-CM | POA: Diagnosis not present

## 2012-05-02 DIAGNOSIS — R069 Unspecified abnormalities of breathing: Secondary | ICD-10-CM | POA: Diagnosis not present

## 2012-05-04 ENCOUNTER — Ambulatory Visit (HOSPITAL_BASED_OUTPATIENT_CLINIC_OR_DEPARTMENT_OTHER): Payer: Medicare Other | Admitting: Oncology

## 2012-05-04 ENCOUNTER — Ambulatory Visit (HOSPITAL_BASED_OUTPATIENT_CLINIC_OR_DEPARTMENT_OTHER): Payer: Medicare Other

## 2012-05-04 ENCOUNTER — Other Ambulatory Visit (HOSPITAL_BASED_OUTPATIENT_CLINIC_OR_DEPARTMENT_OTHER): Payer: Medicare Other | Admitting: Lab

## 2012-05-04 VITALS — BP 134/75 | HR 73 | Temp 97.7°F | Resp 20 | Ht 64.0 in | Wt 197.9 lb

## 2012-05-04 DIAGNOSIS — R110 Nausea: Secondary | ICD-10-CM

## 2012-05-04 DIAGNOSIS — R911 Solitary pulmonary nodule: Secondary | ICD-10-CM

## 2012-05-04 DIAGNOSIS — D509 Iron deficiency anemia, unspecified: Secondary | ICD-10-CM

## 2012-05-04 DIAGNOSIS — Z5111 Encounter for antineoplastic chemotherapy: Secondary | ICD-10-CM | POA: Diagnosis not present

## 2012-05-04 DIAGNOSIS — C180 Malignant neoplasm of cecum: Secondary | ICD-10-CM | POA: Diagnosis not present

## 2012-05-04 DIAGNOSIS — C182 Malignant neoplasm of ascending colon: Secondary | ICD-10-CM

## 2012-05-04 DIAGNOSIS — C189 Malignant neoplasm of colon, unspecified: Secondary | ICD-10-CM

## 2012-05-04 LAB — CBC WITH DIFFERENTIAL/PLATELET
Basophils Absolute: 0 10*3/uL (ref 0.0–0.1)
EOS%: 5.9 % (ref 0.0–7.0)
HGB: 11 g/dL — ABNORMAL LOW (ref 11.6–15.9)
MCV: 76.2 fL — ABNORMAL LOW (ref 79.5–101.0)
MONO%: 13.2 % (ref 0.0–14.0)
NEUT#: 1.7 10*3/uL (ref 1.5–6.5)
NEUT%: 40.6 % (ref 38.4–76.8)
Platelets: 132 10*3/uL — ABNORMAL LOW (ref 145–400)
RBC: 4.42 10*6/uL (ref 3.70–5.45)

## 2012-05-04 LAB — COMPREHENSIVE METABOLIC PANEL (CC13)
Alkaline Phosphatase: 114 U/L (ref 40–150)
BUN: 9 mg/dL (ref 7.0–26.0)
Creatinine: 0.7 mg/dL (ref 0.6–1.1)
Glucose: 96 mg/dl (ref 70–99)
Total Bilirubin: 0.4 mg/dL (ref 0.20–1.20)

## 2012-05-04 MED ORDER — OXALIPLATIN CHEMO INJECTION 100 MG/20ML
100.0000 mg/m2 | Freq: Once | INTRAVENOUS | Status: AC
  Administered 2012-05-04: 200 mg via INTRAVENOUS
  Filled 2012-05-04: qty 40

## 2012-05-04 MED ORDER — HEPARIN SOD (PORK) LOCK FLUSH 100 UNIT/ML IV SOLN
500.0000 [IU] | Freq: Once | INTRAVENOUS | Status: AC | PRN
  Administered 2012-05-04: 500 [IU]
  Filled 2012-05-04: qty 5

## 2012-05-04 MED ORDER — DEXAMETHASONE SODIUM PHOSPHATE 10 MG/ML IJ SOLN
10.0000 mg | Freq: Once | INTRAMUSCULAR | Status: AC
  Administered 2012-05-04: 10 mg via INTRAVENOUS

## 2012-05-04 MED ORDER — SODIUM CHLORIDE 0.9 % IJ SOLN
10.0000 mL | INTRAMUSCULAR | Status: DC | PRN
  Administered 2012-05-04: 10 mL
  Filled 2012-05-04: qty 10

## 2012-05-04 MED ORDER — INFLUENZA VIRUS VACC SPLIT PF IM SUSP
0.5000 mL | Freq: Once | INTRAMUSCULAR | Status: DC
  Filled 2012-05-04: qty 0.5

## 2012-05-04 MED ORDER — PALONOSETRON HCL INJECTION 0.25 MG/5ML
0.2500 mg | Freq: Once | INTRAVENOUS | Status: AC
  Administered 2012-05-04: 0.25 mg via INTRAVENOUS

## 2012-05-04 MED ORDER — DEXTROSE 5 % IV SOLN
Freq: Once | INTRAVENOUS | Status: AC
  Administered 2012-05-04: 20 mL via INTRAVENOUS

## 2012-05-04 NOTE — Progress Notes (Signed)
   [facility]    OFFICE PROGRESS NOTE   INTERVAL HISTORY:   She was scheduled for a fourth cycle of CAPOX on 04/27/2012. Chemotherapy was held secondary to hand/foot syndrome. She reports the erythema and discomfort at the hands and feet has improved. She denies neuropathy symptoms. She has intermittent anterior chest discomfort. This is worse when lying supine. She has seen a cardiologist. Loose stools following chemotherapy are relieved with Lomotil.  Objective:  Vital signs in last 24 hours:  Blood pressure 134/75, pulse 73, temperature 97.7 F (36.5 C), temperature source Oral, resp. rate 20, height 5\' 4"  (1.626 m), weight 197 lb 14.4 oz (89.767 kg).    HEENT: No thrush or ulcers Resp: Lungs clear bilaterally Cardio: Regular rate and rhythm GI: No hepatomegaly, nontender Vascular: No leg edema  Skin: Hyperpigmentation at the palms and soles without skin breakdown or erythema   Portacath/PICC-without erythema  Lab Results:  Lab Results  Component Value Date   WBC 4.1 05/04/2012   HGB 11.0* 05/04/2012   HCT 33.7* 05/04/2012   MCV 76.2* 05/04/2012   PLT 132* 05/04/2012   ANC 1.7   Medications: I have reviewed the patient's current medications.  Assessment/Plan: 1. 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 She began adjuvant CAPOX chemotherapy on 02/24/2012. She has completed 3 cycles. 2. Delayed nausea following cycle 1 CAPOX. Aloxi was added beginning with cycle 2 with improvement. 3. Diabetes. 4. Microcytic anemia. Hemoglobin is stable. She continues oral iron. 5. Remote history of a "colon tumor" removed endoscopically in 1980. 6. Left lower lung nodule noted on a CT 02/21/2012, slightly larger than on a CT 12/03/2011. Plan for a three-month interval CT. 7. Status post Port-A-Cath placement 02/22/2012. Status post Port-A-Cath repositioning 03/14/2012. 8. Probable acute laryngopharyngeal dysesthesia  following cycle 2 CAPOX. Symptoms resolved with warming.  9. 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 She has no numbness at present. 10. Anterior chest pain-she reports undergoing an evaluation by Dr. Rennis Golden 11. Hand-foot syndrome secondary to Xeloda-improved   Disposition:  The hand-foot symptoms have improved. The plan is to proceed with cycle 4 of CAPOX today. The Xeloda will be dose reduced to 1000 mg twice daily. She will discontinue Xeloda and contact us if she develops recurrent hand or foot pain. [name] will return for an office visit and cycle 5 of chemotherapy in 3 weeks.   Thornton Papas, MD  05/04/2012  7:30 PM

## 2012-05-04 NOTE — Patient Instructions (Signed)
Call for hand/foot pain

## 2012-05-17 ENCOUNTER — Other Ambulatory Visit: Payer: Self-pay | Admitting: Oncology

## 2012-05-18 ENCOUNTER — Other Ambulatory Visit: Payer: Medicare Other | Admitting: Lab

## 2012-05-18 ENCOUNTER — Encounter: Payer: Medicare Other | Admitting: Nutrition

## 2012-05-18 ENCOUNTER — Ambulatory Visit: Payer: Medicare Other

## 2012-05-18 ENCOUNTER — Ambulatory Visit: Payer: Medicare Other | Admitting: Oncology

## 2012-05-24 ENCOUNTER — Other Ambulatory Visit: Payer: Self-pay | Admitting: Oncology

## 2012-05-25 ENCOUNTER — Ambulatory Visit: Payer: Medicare Other | Admitting: Nutrition

## 2012-05-25 ENCOUNTER — Ambulatory Visit (HOSPITAL_BASED_OUTPATIENT_CLINIC_OR_DEPARTMENT_OTHER): Payer: Medicare Other

## 2012-05-25 ENCOUNTER — Telehealth: Payer: Self-pay | Admitting: Oncology

## 2012-05-25 ENCOUNTER — Ambulatory Visit: Payer: Medicare Other | Admitting: Oncology

## 2012-05-25 ENCOUNTER — Other Ambulatory Visit (HOSPITAL_BASED_OUTPATIENT_CLINIC_OR_DEPARTMENT_OTHER): Payer: Medicare Other | Admitting: Lab

## 2012-05-25 VITALS — BP 128/75 | HR 64 | Temp 97.2°F | Resp 20 | Ht 64.0 in | Wt 199.5 lb

## 2012-05-25 DIAGNOSIS — C182 Malignant neoplasm of ascending colon: Secondary | ICD-10-CM

## 2012-05-25 DIAGNOSIS — C180 Malignant neoplasm of cecum: Secondary | ICD-10-CM | POA: Diagnosis not present

## 2012-05-25 DIAGNOSIS — R110 Nausea: Secondary | ICD-10-CM

## 2012-05-25 DIAGNOSIS — Z5111 Encounter for antineoplastic chemotherapy: Secondary | ICD-10-CM

## 2012-05-25 DIAGNOSIS — Z23 Encounter for immunization: Secondary | ICD-10-CM

## 2012-05-25 DIAGNOSIS — D509 Iron deficiency anemia, unspecified: Secondary | ICD-10-CM | POA: Diagnosis not present

## 2012-05-25 LAB — CBC WITH DIFFERENTIAL/PLATELET
BASO%: 0.8 % (ref 0.0–2.0)
EOS%: 4.2 % (ref 0.0–7.0)
MCH: 26.8 pg (ref 25.1–34.0)
MCHC: 33.1 g/dL (ref 31.5–36.0)
RBC: 4.36 10*6/uL (ref 3.70–5.45)
RDW: 27.8 % — ABNORMAL HIGH (ref 11.2–14.5)
lymph#: 1.6 10*3/uL (ref 0.9–3.3)
nRBC: 0 % (ref 0–0)

## 2012-05-25 LAB — COMPREHENSIVE METABOLIC PANEL (CC13)
AST: 22 U/L (ref 5–34)
BUN: 11 mg/dL (ref 7.0–26.0)
CO2: 26 mEq/L (ref 22–29)
Calcium: 9.3 mg/dL (ref 8.4–10.4)
Chloride: 107 mEq/L (ref 98–107)
Creatinine: 0.9 mg/dL (ref 0.6–1.1)
Total Bilirubin: 0.31 mg/dL (ref 0.20–1.20)

## 2012-05-25 MED ORDER — HEPARIN SOD (PORK) LOCK FLUSH 100 UNIT/ML IV SOLN
500.0000 [IU] | Freq: Once | INTRAVENOUS | Status: AC | PRN
  Administered 2012-05-25: 500 [IU]
  Filled 2012-05-25: qty 5

## 2012-05-25 MED ORDER — PALONOSETRON HCL INJECTION 0.25 MG/5ML
0.2500 mg | Freq: Once | INTRAVENOUS | Status: AC
  Administered 2012-05-25: 0.25 mg via INTRAVENOUS

## 2012-05-25 MED ORDER — SODIUM CHLORIDE 0.9 % IJ SOLN
10.0000 mL | INTRAMUSCULAR | Status: DC | PRN
  Administered 2012-05-25: 10 mL
  Filled 2012-05-25: qty 10

## 2012-05-25 MED ORDER — INFLUENZA VIRUS VACC SPLIT PF IM SUSP
0.5000 mL | Freq: Once | INTRAMUSCULAR | Status: AC
  Administered 2012-05-25: 0.5 mL via INTRAMUSCULAR
  Filled 2012-05-25: qty 0.5

## 2012-05-25 MED ORDER — OXALIPLATIN CHEMO INJECTION 100 MG/20ML
100.0000 mg/m2 | Freq: Once | INTRAVENOUS | Status: AC
  Administered 2012-05-25: 200 mg via INTRAVENOUS
  Filled 2012-05-25: qty 40

## 2012-05-25 MED ORDER — DEXAMETHASONE SODIUM PHOSPHATE 10 MG/ML IJ SOLN
10.0000 mg | Freq: Once | INTRAMUSCULAR | Status: AC
  Administered 2012-05-25: 10 mg via INTRAVENOUS

## 2012-05-25 MED ORDER — DEXTROSE 5 % IV SOLN
Freq: Once | INTRAVENOUS | Status: AC
  Administered 2012-05-25: 13:00:00 via INTRAVENOUS

## 2012-05-25 NOTE — Telephone Encounter (Signed)
appts made and printed for pt,pt aware that tx will follow appts email to mw to add     aom

## 2012-05-25 NOTE — Progress Notes (Signed)
   [facility]    OFFICE PROGRESS NOTE   INTERVAL HISTORY:   She returns as scheduled. She completed another cycle of CAPOX with a dose reduction of Xeloda beginning on 05/04/2012. She had diarrhea approximately 2 times daily following chemotherapy. This was relieved with Lomotil. No diarrhea at present. She continues to have cold sensitivity. She noted tingling in the fingers following this cycle of chemotherapy. The feet are "peeling". Less erythema and discomfort over the hands and feet. She saw her cardiologist for evaluation of the chest discomfort.   Objective:  Vital signs in last 24 hours:  Blood pressure 128/75, pulse 64, temperature 97.2 F (36.2 C), temperature source Oral, resp. rate 20, height 5\' 4"  (1.626 m), weight 199 lb 8 oz (90.493 kg).    HEENT: No thrush or ulcers Resp: Lungs clear bilaterally Cardio: Regular rate and rhythm GI: No hepatomegaly Vascular: No leg edema Neuro: The vibratory sense is mildly decreased at the fingertips bilaterally  Skin: Hyperpigmentation and skin thickening at the hands/feet. No erythema or skin breakdown over the hands. Dry desquamation at the feet.   Portacath/PICC-without erythema  Lab Results:  Lab Results  Component Value Date   WBC 3.8* 05/25/2012   HGB 11.7 05/25/2012   HCT 35.3 05/25/2012   MCV 81.0 05/25/2012   PLT 178 05/25/2012   ANC 1.4   Medications: I have reviewed the patient's current medications.  Assessment/Plan: 1. 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 She began adjuvant CAPOX chemotherapy on 02/24/2012. She has completed 4 cycles. 2. Delayed nausea following cycle 1 CAPOX. Aloxi was added beginning with cycle 2 with improvement. 3. Diabetes. 4. Microcytic anemia. Hemoglobin is stable. She continues oral iron. 5. Remote history of a "colon tumor" removed endoscopically in 1980. 6. Left lower lung nodule noted on a CT 02/21/2012,  slightly larger than on a CT 12/03/2011. Plan for a three-month interval CT. 7. Status post Port-A-Cath placement 02/22/2012. Status post Port-A-Cath repositioning 03/14/2012. 8. Probable acute laryngopharyngeal dysesthesia following cycle 2 CAPOX. Symptoms resolved with warming.  9. 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 Mild loss of vibratory sense on exam today. 10. Anterior chest pain-she reports undergoing an evaluation by Dr. Rennis Golden 11. Hand-foot syndrome secondary to Xeloda-improved with a dose reduction of Xeloda   Disposition:  The plan is to proceed with cycle 5 of CAPOX today. She will return for an office visit and chemotherapy in 3 weeks. She will continue the dose reduced Xeloda with this cycle of chemotherapy. Peggy French most to discontinue Xeloda and contact us for hand/foot pain. She knows to call for a fever.  We will schedule a repeat CT the chest and followup on the left lung nodule.   Peggy Papas, MD  05/25/2012  11:17 AM

## 2012-05-25 NOTE — Progress Notes (Signed)
Patient reports good appetite. She denies problems eating at this time. Her weight is increased slightly to 199.5 pounds on November 14 from 194.7 pounds on September 26. She has no nutrition concerns.  Nutrition diagnosis: Food and nutrition related knowledge deficit has resolved.  I provided support and encouragement for patient to continue oral intake following strategies that allows her to achieve weight maintenance. I've encouraged her to contact me she has any questions or concerns. Patient is agreeable.

## 2012-05-25 NOTE — Progress Notes (Signed)
Ok to treat pt today with ANC 1.4 per Dr. Truett Perna, verbal received and read back.

## 2012-05-25 NOTE — Patient Instructions (Signed)
Stop xeloda and call Cancer Center for increased hand or foot pain

## 2012-05-25 NOTE — Patient Instructions (Addendum)
[facility] Discharge Instructions for Patients Receiving Chemotherapy  Today you received the following chemotherapy agents: Oxaliplatin  To help prevent nausea and vomiting after your treatment, we encourage you to take your nausea medication as directed by your MD.   If you develop nausea and vomiting that is not controlled by your nausea medication, call the clinic. If it is after clinic hours your family physician or the after hours number for the clinic or go to the Emergency Department.   BELOW ARE SYMPTOMS THAT SHOULD BE REPORTED IMMEDIATELY:  *FEVER GREATER THAN 100.5 F  *CHILLS WITH OR WITHOUT FEVER  NAUSEA AND VOMITING THAT IS NOT CONTROLLED WITH YOUR NAUSEA MEDICATION  *UNUSUAL SHORTNESS OF BREATH  *UNUSUAL BRUISING OR BLEEDING  TENDERNESS IN MOUTH AND THROAT WITH OR WITHOUT PRESENCE OF ULCERS  *URINARY PROBLEMS  *BOWEL PROBLEMS  UNUSUAL RASH Items with * indicate a potential emergency and should be followed up as soon as possible. Feel free to call the clinic you have any questions or concerns. The clinic phone number is [phone].

## 2012-05-26 ENCOUNTER — Telehealth: Payer: Self-pay | Admitting: *Deleted

## 2012-05-26 NOTE — Telephone Encounter (Signed)
Informed patient of K+ result and to increase her KCl to twice daily. She understands and agrees.

## 2012-05-26 NOTE — Telephone Encounter (Signed)
Message copied by Wandalee Ferdinand on Fri May 26, 2012 10:10 AM ------      Message from: Peggy French      Created: Thu May 25, 2012  8:59 PM       Please call patient,increase kcl to bid

## 2012-05-29 ENCOUNTER — Telehealth: Payer: Self-pay | Admitting: *Deleted

## 2012-05-29 NOTE — Telephone Encounter (Signed)
Patient confirmed over the phone the new date and time 

## 2012-06-01 DIAGNOSIS — I10 Essential (primary) hypertension: Secondary | ICD-10-CM | POA: Diagnosis not present

## 2012-06-01 DIAGNOSIS — R069 Unspecified abnormalities of breathing: Secondary | ICD-10-CM | POA: Diagnosis not present

## 2012-06-01 DIAGNOSIS — E782 Mixed hyperlipidemia: Secondary | ICD-10-CM | POA: Diagnosis not present

## 2012-06-12 ENCOUNTER — Ambulatory Visit (HOSPITAL_COMMUNITY)
Admission: RE | Admit: 2012-06-12 | Discharge: 2012-06-12 | Disposition: A | Discharge status: Home / Self Care | Payer: Medicare Other | Source: Ambulatory Visit | Attending: Oncology | Admitting: Oncology

## 2012-06-12 ENCOUNTER — Other Ambulatory Visit: Payer: Self-pay | Admitting: Oncology

## 2012-06-12 DIAGNOSIS — C182 Malignant neoplasm of ascending colon: Secondary | ICD-10-CM

## 2012-06-12 DIAGNOSIS — R911 Solitary pulmonary nodule: Secondary | ICD-10-CM | POA: Diagnosis not present

## 2012-06-12 DIAGNOSIS — C189 Malignant neoplasm of colon, unspecified: Secondary | ICD-10-CM | POA: Diagnosis not present

## 2012-06-14 ENCOUNTER — Other Ambulatory Visit: Payer: Self-pay | Admitting: Oncology

## 2012-06-15 ENCOUNTER — Ambulatory Visit (HOSPITAL_BASED_OUTPATIENT_CLINIC_OR_DEPARTMENT_OTHER): Payer: Medicare Other

## 2012-06-15 ENCOUNTER — Ambulatory Visit (HOSPITAL_BASED_OUTPATIENT_CLINIC_OR_DEPARTMENT_OTHER): Payer: Medicare Other | Admitting: Nurse Practitioner

## 2012-06-15 ENCOUNTER — Other Ambulatory Visit (HOSPITAL_BASED_OUTPATIENT_CLINIC_OR_DEPARTMENT_OTHER): Payer: Medicare Other | Admitting: Lab

## 2012-06-15 ENCOUNTER — Telehealth: Payer: Self-pay | Admitting: Oncology

## 2012-06-15 VITALS — BP 141/78 | HR 77 | Temp 98.5°F | Resp 18 | Ht 64.0 in | Wt 201.5 lb

## 2012-06-15 DIAGNOSIS — R911 Solitary pulmonary nodule: Secondary | ICD-10-CM

## 2012-06-15 DIAGNOSIS — C182 Malignant neoplasm of ascending colon: Secondary | ICD-10-CM

## 2012-06-15 DIAGNOSIS — G620 Drug-induced polyneuropathy: Secondary | ICD-10-CM | POA: Diagnosis not present

## 2012-06-15 DIAGNOSIS — C180 Malignant neoplasm of cecum: Secondary | ICD-10-CM

## 2012-06-15 DIAGNOSIS — Z5111 Encounter for antineoplastic chemotherapy: Secondary | ICD-10-CM | POA: Diagnosis not present

## 2012-06-15 LAB — CBC WITH DIFFERENTIAL/PLATELET
BASO%: 0.8 % (ref 0.0–2.0)
EOS%: 3.3 % (ref 0.0–7.0)
HGB: 11.7 g/dL (ref 11.6–15.9)
MCH: 28.1 pg (ref 25.1–34.0)
MCHC: 32.5 g/dL (ref 31.5–36.0)
MCV: 86.5 fL (ref 79.5–101.0)
MONO%: 19.3 % — ABNORMAL HIGH (ref 0.0–14.0)
RBC: 4.16 10*6/uL (ref 3.70–5.45)
RDW: 22.6 % — ABNORMAL HIGH (ref 11.2–14.5)
lymph#: 1.3 10*3/uL (ref 0.9–3.3)
nRBC: 0 % (ref 0–0)

## 2012-06-15 LAB — COMPREHENSIVE METABOLIC PANEL (CC13)
AST: 23 U/L (ref 5–34)
Albumin: 2.9 g/dL — ABNORMAL LOW (ref 3.5–5.0)
Alkaline Phosphatase: 114 U/L (ref 40–150)
Glucose: 114 mg/dl — ABNORMAL HIGH (ref 70–99)
Potassium: 3.6 mEq/L (ref 3.5–5.1)
Sodium: 143 mEq/L (ref 136–145)
Total Bilirubin: 0.38 mg/dL (ref 0.20–1.20)
Total Protein: 7.4 g/dL (ref 6.4–8.3)

## 2012-06-15 MED ORDER — SODIUM CHLORIDE 0.9 % IJ SOLN
10.0000 mL | INTRAMUSCULAR | Status: AC | PRN
  Filled 2012-06-15: qty 10

## 2012-06-15 MED ORDER — DEXAMETHASONE SODIUM PHOSPHATE 10 MG/ML IJ SOLN
10.0000 mg | Freq: Once | INTRAMUSCULAR | Status: AC
  Administered 2012-06-15: 10 mg via INTRAVENOUS

## 2012-06-15 MED ORDER — DEXTROSE 5 % IV SOLN
Freq: Once | INTRAVENOUS | Status: AC
  Administered 2012-06-15: 12:00:00 via INTRAVENOUS

## 2012-06-15 MED ORDER — OXALIPLATIN CHEMO INJECTION 100 MG/20ML
100.0000 mg/m2 | Freq: Once | INTRAVENOUS | Status: AC
  Administered 2012-06-15: 200 mg via INTRAVENOUS
  Filled 2012-06-15: qty 40

## 2012-06-15 MED ORDER — PALONOSETRON HCL INJECTION 0.25 MG/5ML
0.2500 mg | Freq: Once | INTRAVENOUS | Status: AC
  Administered 2012-06-15: 0.25 mg via INTRAVENOUS

## 2012-06-15 MED ORDER — HEPARIN SOD (PORK) LOCK FLUSH 100 UNIT/ML IV SOLN
500.0000 [IU] | Freq: Once | INTRAVENOUS | Status: AC | PRN
  Filled 2012-06-15: qty 5

## 2012-06-15 NOTE — Telephone Encounter (Signed)
appts made and printed for pt aom °

## 2012-06-15 NOTE — Patient Instructions (Signed)
[facility] Discharge Instructions for Patients Receiving Chemotherapy  Today you received the following chemotherapy agents OXaliplatin To help prevent nausea and vomiting after your treatment, we encourage you to take your nausea medication as directed.   If you develop nausea and vomiting that is not controlled by your nausea medication, call the clinic. If it is after clinic hours your family physician or the after hours number for the clinic or go to the Emergency Department.   BELOW ARE SYMPTOMS THAT SHOULD BE REPORTED IMMEDIATELY:  *FEVER GREATER THAN 100.5 F  *CHILLS WITH OR WITHOUT FEVER  NAUSEA AND VOMITING THAT IS NOT CONTROLLED WITH YOUR NAUSEA MEDICATION  *UNUSUAL SHORTNESS OF BREATH  *UNUSUAL BRUISING OR BLEEDING  TENDERNESS IN MOUTH AND THROAT WITH OR WITHOUT PRESENCE OF ULCERS  *URINARY PROBLEMS  *BOWEL PROBLEMS  UNUSUAL RASH Items with * indicate a potential emergency and should be followed up as soon as possible.  One of the nurses will contact you 24 hours after your treatment. Please let the nurse know about any problems that you may have experienced. Feel free to call the clinic you have any questions or concerns. The clinic phone number is [phone].   I have been informed and understand all the instructions given to me. I know to contact the clinic, my physician, or go to the Emergency Department if any problems should occur. I do not have any questions at this time, but understand that I may call the clinic during office hours   should I have any questions or need assistance in obtaining follow up care.    __________________________________________  _____________  __________ Signature of Patient or Authorized Representative            Date                   Time    __________________________________________ Nurse's Signature

## 2012-06-15 NOTE — Progress Notes (Signed)
OFFICE PROGRESS NOTE  Interval history:  Peggy French returns as scheduled. She completed cycle 5 CAPOX beginning 05/25/2012. She denies nausea/vomiting. She noted that her mouth became sore but she did not develop any ulcerations. She tends to have loose stools for a few days after the oxaliplatin. She takes Imodium as needed during this time. She has intermittent numbness/tingling in the fingertips and toes. Symptoms do not interfere with activity. She notes less peeling on her feet. She denies hand or foot pain. The redness is better.   Objective: Blood pressure 141/78, pulse 77, temperature 98.5 F (36.9 C), temperature source Oral, resp. rate 18, height 5\' 4"  (1.626 m), weight 201 lb 8 oz (91.4 kg).  Oropharynx is without thrush or ulceration. Lungs are clear. Regular cardiac rhythm. Port-A-Cath site is without erythema. Abdomen is soft and nontender. No hepatomegaly. Extremities are without edema. Soles are mildly erythematous with dry desquamation at the heels. Palms are nontender and without erythema. There is no skin breakdown over the hands. Vibratory sense is mildly decreased over the fingertips per tuning fork exam.  Lab Results: Lab Results  Component Value Date   WBC 3.9 06/15/2012   HGB 11.7 06/15/2012   HCT 36.0 06/15/2012   MCV 86.5 06/15/2012   PLT 122* 06/15/2012    Chemistry:    Chemistry      Component Value Date/Time   NA 140 05/25/2012 1038   NA 139 02/29/2012 1051   K 3.3* 05/25/2012 1038   K 3.7 02/29/2012 1051   CL 107 05/25/2012 1038   CL 97 02/24/2012 1120   CO2 26 05/25/2012 1038   CO2 28 02/24/2012 1120   BUN 11.0 05/25/2012 1038   BUN 7 02/24/2012 1120   CREATININE 0.9 05/25/2012 1038   CREATININE 0.65 02/24/2012 1120   CREATININE 0.91 12/02/2011 1159      Component Value Date/Time   CALCIUM 9.3 05/25/2012 1038   CALCIUM 9.1 02/24/2012 1120   ALKPHOS 108 05/25/2012 1038   ALKPHOS 88 02/24/2012 1120   AST 22 05/25/2012 1038   AST 73* 02/24/2012 1120   ALT 9 05/25/2012 1038   ALT 13 02/24/2012 1120   BILITOT 0.31 05/25/2012 1038   BILITOT 0.3 02/24/2012 1120       Studies/Results: Ct Chest Wo Contrast  06/12/2012  *RADIOLOGY REPORT*  Clinical Data: Follow-up lung nodule.  History of colon cancer diagnosed 11/2011.  Chemotherapy in progress.  CT CHEST WITHOUT CONTRAST  Technique:  Multidetector CT imaging of the chest was performed following the standard protocol without IV contrast.  Comparison: Chest CT 02/21/2012.  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 There is no axillary adenopathy.  Left subclavian central line with the tip at the cavoatrial junction.  There is no mediastinal adenopathy.  No hilar adenopathy is present. Coronary artery atherosclerosis is present. If office based assessment of coronary risk factors has not been performed, it is now recommended.  No pericardial or pleural effusion.  Incidental imaging the upper abdomen demonstrates cholecystectomy.  No aggressive osseous lesions.  Thoracic spine DISH.  The previously seen 5 mm pulmonary nodule in the left lower lobe ( image 36 series 5) adjacent to the heart border is scarcely visualized, estimated at 3 mm.  Central airways are patent.  No other suspicious pulmonary nodules are identified.  Dependent atelectasis is mild.  IMPRESSION: 1.  The 5 mm left lower lobe pulmonary nodule adjacent to the left heart border is scarcely visualized on today's exam, measuring 3 mm.  In this patient with history of colon cancer, this nodule can probably be followed on an abdominal CT follow-up. 2.  No evidence of metastatic disease to the chest.   Original Report Authenticated By: Andreas Newport, M.D.     Medications: I have reviewed the patient's current medications.  Assessment/Plan:  1. 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 She began adjuvant CAPOX chemotherapy on 02/24/2012. She has completed 5 cycles. 2. Delayed  nausea following cycle 1 CAPOX. Aloxi was added beginning with cycle 2 with improvement. 3. Diabetes. 4. Microcytic anemia. Hemoglobin is stable. She continues oral iron. 5. Remote history of a "colon tumor" removed endoscopically in 1980. 6. Left lower lung nodule noted on a CT 02/21/2012, slightly larger than on a CT 12/03/2011. Chest CT 06/12/2012 showed the previously seen 5 mm pulmonary nodule in left lower lobe was scarcely visualized, estimated at 3 mm. No other suspicious pulmonary nodules were identified. 7.  Status post Port-A-Cath placement 02/22/2012. Status post Port-A-Cath repositioning 03/14/2012. 8. Probable acute laryngopharyngeal dysesthesia following cycle 2 CAPOX. Symptoms resolved with warming.  9. 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 Mild loss of vibratory sense on exam today. 10. Anterior chest pain-she reports undergoing an evaluation by Dr. Rennis Golden. 11. 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  Disposition-[name] appear stable. Plan to proceed with cycle 6 CAPOX today as scheduled. She will return for a followup visit and cycle 7 in 3 weeks. She will contact the office in the interim with any problems.  Plan reviewed with Dr. Truett Perna.  Lonna Cobb ANP/GNP-BC

## 2012-06-20 ENCOUNTER — Other Ambulatory Visit: Payer: Self-pay | Admitting: Certified Registered Nurse Anesthetist

## 2012-06-21 ENCOUNTER — Other Ambulatory Visit: Payer: Self-pay | Admitting: *Deleted

## 2012-06-21 MED ORDER — POTASSIUM CHLORIDE CRYS ER 20 MEQ PO TBCR: 20.0000 meq | EXTENDED_RELEASE_TABLET | Freq: Every day | ORAL | Status: DC

## 2012-06-21 NOTE — Telephone Encounter (Signed)
Call from pt asking if she needs to continue Potassium. Reviewed with Misty Stanley, NP. YES. Rx sent to pharmacy. Pt made aware.

## 2012-07-01 ENCOUNTER — Other Ambulatory Visit: Payer: Self-pay | Admitting: Oncology

## 2012-07-03 ENCOUNTER — Other Ambulatory Visit: Payer: Self-pay | Admitting: *Deleted

## 2012-07-03 DIAGNOSIS — C182 Malignant neoplasm of ascending colon: Secondary | ICD-10-CM

## 2012-07-03 MED ORDER — CAPECITABINE 500 MG PO TABS: 1000.0000 mg | ORAL_TABLET | Freq: Two times a day (BID) | ORAL | Status: DC

## 2012-07-03 NOTE — Telephone Encounter (Signed)
Needs refill authorization for Xeloda. Use reference [id number]

## 2012-07-06 ENCOUNTER — Telehealth: Payer: Self-pay | Admitting: Oncology

## 2012-07-06 ENCOUNTER — Other Ambulatory Visit: Payer: Self-pay | Admitting: Oncology

## 2012-07-06 ENCOUNTER — Other Ambulatory Visit (HOSPITAL_BASED_OUTPATIENT_CLINIC_OR_DEPARTMENT_OTHER): Payer: Medicare Other | Admitting: Lab

## 2012-07-06 ENCOUNTER — Ambulatory Visit (HOSPITAL_BASED_OUTPATIENT_CLINIC_OR_DEPARTMENT_OTHER): Payer: Medicare Other

## 2012-07-06 ENCOUNTER — Ambulatory Visit (HOSPITAL_BASED_OUTPATIENT_CLINIC_OR_DEPARTMENT_OTHER): Payer: Medicare Other | Admitting: Oncology

## 2012-07-06 ENCOUNTER — Telehealth: Payer: Self-pay | Admitting: *Deleted

## 2012-07-06 VITALS — BP 124/71 | HR 88 | Temp 97.3°F | Resp 20 | Ht 64.0 in | Wt 201.7 lb

## 2012-07-06 DIAGNOSIS — C182 Malignant neoplasm of ascending colon: Secondary | ICD-10-CM

## 2012-07-06 DIAGNOSIS — D509 Iron deficiency anemia, unspecified: Secondary | ICD-10-CM | POA: Diagnosis not present

## 2012-07-06 DIAGNOSIS — Z5111 Encounter for antineoplastic chemotherapy: Secondary | ICD-10-CM

## 2012-07-06 DIAGNOSIS — C180 Malignant neoplasm of cecum: Secondary | ICD-10-CM

## 2012-07-06 DIAGNOSIS — R911 Solitary pulmonary nodule: Secondary | ICD-10-CM | POA: Diagnosis not present

## 2012-07-06 DIAGNOSIS — N898 Other specified noninflammatory disorders of vagina: Secondary | ICD-10-CM

## 2012-07-06 LAB — CBC WITH DIFFERENTIAL/PLATELET
Eosinophils Absolute: 0.2 10*3/uL (ref 0.0–0.5)
MCV: 87.3 fL (ref 79.5–101.0)
MONO#: 0.8 10*3/uL (ref 0.1–0.9)
MONO%: 20.8 % — ABNORMAL HIGH (ref 0.0–14.0)
NEUT#: 1.4 10*3/uL — ABNORMAL LOW (ref 1.5–6.5)
RBC: 4.03 10*6/uL (ref 3.70–5.45)
RDW: 19.4 % — ABNORMAL HIGH (ref 11.2–14.5)
WBC: 3.8 10*3/uL — ABNORMAL LOW (ref 3.9–10.3)
lymph#: 1.4 10*3/uL (ref 0.9–3.3)

## 2012-07-06 LAB — COMPREHENSIVE METABOLIC PANEL (CC13)
AST: 26 U/L (ref 5–34)
Alkaline Phosphatase: 124 U/L (ref 40–150)
Glucose: 123 mg/dl — ABNORMAL HIGH (ref 70–99)
Sodium: 142 mEq/L (ref 136–145)
Total Bilirubin: 0.45 mg/dL (ref 0.20–1.20)
Total Protein: 7.3 g/dL (ref 6.4–8.3)

## 2012-07-06 MED ORDER — DEXAMETHASONE SODIUM PHOSPHATE 10 MG/ML IJ SOLN
10.0000 mg | Freq: Once | INTRAMUSCULAR | Status: AC
  Administered 2012-07-06: 10 mg via INTRAVENOUS

## 2012-07-06 MED ORDER — PALONOSETRON HCL INJECTION 0.25 MG/5ML
0.2500 mg | Freq: Once | INTRAVENOUS | Status: AC
  Administered 2012-07-06: 0.25 mg via INTRAVENOUS

## 2012-07-06 MED ORDER — HEPARIN SOD (PORK) LOCK FLUSH 100 UNIT/ML IV SOLN
500.0000 [IU] | Freq: Once | INTRAVENOUS | Status: AC | PRN
  Administered 2012-07-06: 500 [IU]
  Filled 2012-07-06: qty 5

## 2012-07-06 MED ORDER — DEXTROSE 5 % IV SOLN
Freq: Once | INTRAVENOUS | Status: AC
  Administered 2012-07-06: 11:00:00 via INTRAVENOUS

## 2012-07-06 MED ORDER — FLUCONAZOLE 150 MG PO TABS: 150.0000 mg | ORAL_TABLET | Freq: Once | ORAL | Status: DC

## 2012-07-06 MED ORDER — OXALIPLATIN CHEMO INJECTION 100 MG/20ML
80.0000 mg/m2 | Freq: Once | INTRAVENOUS | Status: AC
  Administered 2012-07-06: 160 mg via INTRAVENOUS
  Filled 2012-07-06: qty 32

## 2012-07-06 MED ORDER — SODIUM CHLORIDE 0.9 % IJ SOLN
10.0000 mL | INTRAMUSCULAR | Status: DC | PRN
  Administered 2012-07-06: 10 mL
  Filled 2012-07-06: qty 10

## 2012-07-06 NOTE — Telephone Encounter (Signed)
Per staff message and POF I have scheduled appts.  JMW  

## 2012-07-06 NOTE — Progress Notes (Signed)
Ok to treat with ANC 1.4 per Dr. Sherrill.  

## 2012-07-06 NOTE — Progress Notes (Signed)
   [facility]    OFFICE PROGRESS NOTE   INTERVAL HISTORY:   She returns as scheduled. She completed another cycle of CAPOX beginning on 06/15/2012. She reports cold sensitivity lasting for approximately 2 weeks following chemotherapy. No peripheral numbness or tingling. Mild diarrhea lasted for a few days. She complains of a vaginal discharge. No nausea or mouth sores. The hand/foot hyperpigmentation has improved. The discomfort at the right lower quadrant scar has improved.  Objective:  Vital signs in last 24 hours:  Blood pressure 124/71, pulse 88, temperature 97.3 F (36.3 C), resp. rate 20, height 5\' 4"  (1.626 m), weight 201 lb 11.2 oz (91.491 kg).    HEENT: Hyperpigmentation at the buccal mucosa and tongue, no thrush or ulcers Resp: Lungs clear bilaterally Cardio: Regular rate and rhythm GI: No hepatosplenomegaly, mild tenderness in the right low abdomen, no mass Vascular: No leg edema Neuro: Mild to moderate decrease in vibratory sense at the fingertips and toes  Skin: Hyperpigmentation at the hands and feet without skin breakdown   Portacath/PICC-without erythema  Lab Results:  Lab Results  Component Value Date   WBC 3.8* 07/06/2012   HGB 11.6 07/06/2012   HCT 35.2 07/06/2012   MCV 87.3 07/06/2012   PLT 116* 07/06/2012   ANC 1.4    Medications: I have reviewed the patient's current medications.  Assessment/Plan: 1. 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 She began adjuvant CAPOX chemotherapy on 02/24/2012. She has completed 6 cycles. 2. Delayed nausea following cycle 1 CAPOX. Aloxi was added beginning with cycle 2 with improvement. 3. Diabetes. 4. Microcytic anemia. Hemoglobin is stable. She continues oral iron. 5. Remote history of a "colon tumor" removed endoscopically in 1980. 6. Left lower lung nodule noted on a CT 02/21/2012, slightly larger than on a CT 12/03/2011. Chest CT 06/12/2012 showed  the previously seen 5 mm pulmonary nodule in left lower lobe was scarcely visualized, estimated at 3 mm. No other suspicious pulmonary nodules were identified. 7. Status post Port-A-Cath placement 02/22/2012. Status post Port-A-Cath repositioning 03/14/2012. 8. Probable acute laryngopharyngeal dysesthesia following cycle 2 CAPOX. Symptoms resolved with warming.  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 Mild to moderate loss of vibratory sense on exam today. 10. Anterior chest pain-she reports undergoing an evaluation by Dr. Rennis Golden. 11. 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 12. Vaginal discharge-she will complete treatment with Diflucan   Disposition:  The plan is to proceed with cycle 7 of adjuvant CAPOX today. We will dose reduce the oxaliplatin secondary to neuropathy and cytopenias. She knows to contact us for a fever or signs of infection. She will return for an office visit and the final cycle of CAPOX on 07/28/2011.   Thornton Papas, MD  07/06/2012  10:20 AM

## 2012-07-06 NOTE — Telephone Encounter (Signed)
appts made and printed for pt pt aware that chemo will follow md and email to mw    anne

## 2012-07-06 NOTE — Patient Instructions (Signed)
[facility] Discharge Instructions for Patients Receiving Chemotherapy  Today you received the following chemotherapy agents Oxaliplatin  To help prevent nausea and vomiting after your treatment, we encourage you to take your nausea medication as prescribed.   If you develop nausea and vomiting that is not controlled by your nausea medication, call the clinic. If it is after clinic hours your family physician or the after hours number for the clinic or go to the Emergency Department.   BELOW ARE SYMPTOMS THAT SHOULD BE REPORTED IMMEDIATELY:  *FEVER GREATER THAN 100.5 F  *CHILLS WITH OR WITHOUT FEVER  NAUSEA AND VOMITING THAT IS NOT CONTROLLED WITH YOUR NAUSEA MEDICATION  *UNUSUAL SHORTNESS OF BREATH  *UNUSUAL BRUISING OR BLEEDING  TENDERNESS IN MOUTH AND THROAT WITH OR WITHOUT PRESENCE OF ULCERS  *URINARY PROBLEMS  *BOWEL PROBLEMS  UNUSUAL RASH Items with * indicate a potential emergency and should be followed up as soon as possible.  One of the nurses will contact you 24 hours after your treatment. Please let the nurse know about any problems that you may have experienced. Feel free to call the clinic you have any questions or concerns. The clinic phone number is [phone].   I have been informed and understand all the instructions given to me. I know to contact the clinic, my physician, or go to the Emergency Department if any problems should occur. I do not have any questions at this time, but understand that I may call the clinic during office hours   should I have any questions or need assistance in obtaining follow up care.    __________________________________________  _____________  __________ Signature of Patient or Authorized Representative            Date                   Time    __________________________________________ Nurse's Signature    

## 2012-07-21 ENCOUNTER — Telehealth: Payer: Self-pay | Admitting: *Deleted

## 2012-07-21 NOTE — Telephone Encounter (Signed)
Call from pt asking if she needs to refill the Xeloda prescription. Last Oxaliplatin tx scheduled for 07/27/12 with office visit that day. Instructed her to call and refill med, do not start until she sees MD. She voiced understanding.

## 2012-07-26 ENCOUNTER — Other Ambulatory Visit: Payer: Self-pay | Admitting: Oncology

## 2012-07-27 ENCOUNTER — Ambulatory Visit: Payer: Medicare Other

## 2012-07-27 ENCOUNTER — Other Ambulatory Visit (HOSPITAL_BASED_OUTPATIENT_CLINIC_OR_DEPARTMENT_OTHER): Payer: Medicare Other | Admitting: Lab

## 2012-07-27 ENCOUNTER — Telehealth: Payer: Self-pay | Admitting: Oncology

## 2012-07-27 ENCOUNTER — Ambulatory Visit (HOSPITAL_BASED_OUTPATIENT_CLINIC_OR_DEPARTMENT_OTHER): Payer: Medicare Other

## 2012-07-27 ENCOUNTER — Ambulatory Visit (HOSPITAL_BASED_OUTPATIENT_CLINIC_OR_DEPARTMENT_OTHER): Payer: Medicare Other | Admitting: Nurse Practitioner

## 2012-07-27 VITALS — BP 121/72 | HR 73 | Temp 97.3°F | Resp 18 | Ht 64.0 in | Wt 200.6 lb

## 2012-07-27 DIAGNOSIS — C180 Malignant neoplasm of cecum: Secondary | ICD-10-CM | POA: Diagnosis not present

## 2012-07-27 DIAGNOSIS — G620 Drug-induced polyneuropathy: Secondary | ICD-10-CM

## 2012-07-27 DIAGNOSIS — C182 Malignant neoplasm of ascending colon: Secondary | ICD-10-CM

## 2012-07-27 DIAGNOSIS — R911 Solitary pulmonary nodule: Secondary | ICD-10-CM | POA: Diagnosis not present

## 2012-07-27 DIAGNOSIS — Z5111 Encounter for antineoplastic chemotherapy: Secondary | ICD-10-CM

## 2012-07-27 LAB — CBC WITH DIFFERENTIAL/PLATELET
Eosinophils Absolute: 0.2 10*3/uL (ref 0.0–0.5)
MONO#: 0.7 10*3/uL (ref 0.1–0.9)
MONO%: 18.9 % — ABNORMAL HIGH (ref 0.0–14.0)
NEUT#: 1.3 10*3/uL — ABNORMAL LOW (ref 1.5–6.5)
RBC: 4.15 10*6/uL (ref 3.70–5.45)
RDW: 18.3 % — ABNORMAL HIGH (ref 11.2–14.5)
WBC: 3.9 10*3/uL (ref 3.9–10.3)
nRBC: 0 % (ref 0–0)

## 2012-07-27 LAB — COMPREHENSIVE METABOLIC PANEL (CC13)
ALT: 9 U/L (ref 0–55)
AST: 24 U/L (ref 5–34)
Alkaline Phosphatase: 120 U/L (ref 40–150)
Calcium: 9.1 mg/dL (ref 8.4–10.4)
Chloride: 105 mEq/L (ref 98–107)
Creatinine: 0.8 mg/dL (ref 0.6–1.1)
Potassium: 3.2 mEq/L — ABNORMAL LOW (ref 3.5–5.1)

## 2012-07-27 MED ORDER — HEPARIN SOD (PORK) LOCK FLUSH 100 UNIT/ML IV SOLN
500.0000 [IU] | Freq: Once | INTRAVENOUS | Status: AC | PRN
  Administered 2012-07-27: 500 [IU]
  Filled 2012-07-27: qty 5

## 2012-07-27 MED ORDER — SODIUM CHLORIDE 0.9 % IJ SOLN
10.0000 mL | INTRAMUSCULAR | Status: DC | PRN
  Administered 2012-07-27: 10 mL
  Filled 2012-07-27: qty 10

## 2012-07-27 MED ORDER — OXALIPLATIN CHEMO INJECTION 100 MG/20ML
80.0000 mg/m2 | Freq: Once | INTRAVENOUS | Status: AC
  Administered 2012-07-27: 160 mg via INTRAVENOUS
  Filled 2012-07-27: qty 32

## 2012-07-27 MED ORDER — PALONOSETRON HCL INJECTION 0.25 MG/5ML
0.2500 mg | Freq: Once | INTRAVENOUS | Status: AC
  Administered 2012-07-27: 0.25 mg via INTRAVENOUS

## 2012-07-27 MED ORDER — DEXTROSE 5 % IV SOLN
Freq: Once | INTRAVENOUS | Status: AC
  Administered 2012-07-27: 13:00:00 via INTRAVENOUS

## 2012-07-27 MED ORDER — DEXAMETHASONE SODIUM PHOSPHATE 10 MG/ML IJ SOLN
10.0000 mg | Freq: Once | INTRAMUSCULAR | Status: AC
  Administered 2012-07-27: 10 mg via INTRAVENOUS

## 2012-07-27 NOTE — Progress Notes (Signed)
OFFICE PROGRESS NOTE  Interval history:  [facility] French returns as scheduled. She completed cycle 7 CAPOX beginning 07/06/2012. She had mild nausea. No vomiting. She had diarrhea for approximately 3 days. She took Lomotil with good results. She denies mouth sores. The cold sensitivity lasted approximately 2 weeks. She has persistent numbness in the fingertips and feet. The numbness does not interfere with activity. No hand or foot pain or redness. The vaginal discharge she was experiencing at the time of her last visit has resolved. She continues to note mild vaginal itching.   Objective: Blood pressure 121/72, pulse 73, temperature 97.3 F (36.3 C), temperature source Oral, resp. rate 18, height 5\' 4"  (1.626 m), weight 200 lb 9.6 oz (90.992 kg).  Oropharynx is without thrush or ulceration. Lungs are clear. Regular cardiac rhythm. Port-A-Cath site is without erythema. Abdomen is soft and nontender. No hepatomegaly. Extremities without edema. Moderate decrease in vibratory sense at the fingertips.  Lab Results: Lab Results  Component Value Date   WBC 3.9 07/27/2012   HGB 12.2 07/27/2012   HCT 36.9 07/27/2012   MCV 88.9 07/27/2012   PLT 125* 07/27/2012    Chemistry:    Chemistry      Component Value Date/Time   NA 142 07/06/2012 0941   NA 139 02/29/2012 1051   K 3.4* 07/06/2012 0941   K 3.7 02/29/2012 1051   CL 107 07/06/2012 0941   CL 97 02/24/2012 1120   CO2 25 07/06/2012 0941   CO2 28 02/24/2012 1120   BUN 12.0 07/06/2012 0941   BUN 7 02/24/2012 1120   CREATININE 0.9 07/06/2012 0941   CREATININE 0.65 02/24/2012 1120   CREATININE 0.91 12/02/2011 1159      Component Value Date/Time   CALCIUM 8.9 07/06/2012 0941   CALCIUM 9.1 02/24/2012 1120   ALKPHOS 124 07/06/2012 0941   ALKPHOS 88 02/24/2012 1120   AST 26 07/06/2012 0941   AST 73* 02/24/2012 1120   ALT 13 07/06/2012 0941   ALT 13 02/24/2012 1120   BILITOT 0.45 07/06/2012 0941   BILITOT 0.3 02/24/2012 1120        Studies/Results: No results found.  Medications: I have reviewed the patient's current medications.  Assessment/Plan:  1. 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 She began adjuvant CAPOX chemotherapy on 02/24/2012. She has completed 7 cycles. 2. Delayed nausea following cycle 1 CAPOX. Aloxi was added beginning with cycle 2 with improvement. 3. Diabetes. 4. Microcytic anemia. Hemoglobin is stable. She continues oral iron. 5. Remote history of a "colon tumor" removed endoscopically in 1980. 6. Left lower lung nodule noted on a CT 02/21/2012, slightly larger than on a CT 12/03/2011. Chest CT 06/12/2012 showed the previously seen 5 mm pulmonary nodule in left lower lobe was scarcely visualized, estimated at 3 mm. No other suspicious pulmonary nodules were identified. 7. Status post Port-A-Cath placement 02/22/2012. Status post Port-A-Cath repositioning 03/14/2012. 8. Probable acute laryngopharyngeal dysesthesia following cycle 2 CAPOX. Symptoms resolved with warming.  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  Moderate loss of vibratory sense on exam today. 10. History of anterior chest pain-she reports undergoing an evaluation by Dr. Rennis Golden. 11. 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 12. Vaginal discharge reported when here on 07/06/2012. She completed a course of Diflucan.  Disposition-[name] appear stable. Plan to proceed with the eighth and final cycle of adjuvant CAPOX today as scheduled. She will return for a followup CBC in 2 weeks.  She will return for a followup visit in one month. She will contact the office in the interim with any problems. We specifically discussed fever, signs of infection, bleeding.  Plan reviewed with Dr. Truett Perna.  Peggy French ANP/GNP-BC

## 2012-07-27 NOTE — Telephone Encounter (Signed)
Gave pt appt for labs on 08/10/12 then see MD with labs on February 2014

## 2012-07-27 NOTE — Patient Instructions (Addendum)
[facility] Discharge Instructions for Patients Receiving Chemotherapy  Today you received the following chemotherapy agents Oxaliplatin.  To help prevent nausea and vomiting after your treatment, we encourage you to take your nausea medication as prescribed.   If you develop nausea and vomiting that is not controlled by your nausea medication, call the clinic. If it is after clinic hours your family physician or the after hours number for the clinic or go to the Emergency Department.   BELOW ARE SYMPTOMS THAT SHOULD BE REPORTED IMMEDIATELY:  *FEVER GREATER THAN 100.5 F  *CHILLS WITH OR WITHOUT FEVER  NAUSEA AND VOMITING THAT IS NOT CONTROLLED WITH YOUR NAUSEA MEDICATION  *UNUSUAL SHORTNESS OF BREATH  *UNUSUAL BRUISING OR BLEEDING  TENDERNESS IN MOUTH AND THROAT WITH OR WITHOUT PRESENCE OF ULCERS  *URINARY PROBLEMS  *BOWEL PROBLEMS  UNUSUAL RASH Items with * indicate a potential emergency and should be followed up as soon as possible.  Feel free to call the clinic you have any questions or concerns. The clinic phone number is [phone].   I have been informed and understand all the instructions given to me. I know to contact the clinic, my physician, or go to the Emergency Department if any problems should occur. I do not have any questions at this time, but understand that I may call the clinic during office hours   should I have any questions or need assistance in obtaining follow up care.    __________________________________________  _____________  __________ Signature of Patient or Authorized Representative            Date                   Time    __________________________________________ Nurse's Signature    

## 2012-07-28 ENCOUNTER — Other Ambulatory Visit: Payer: Self-pay | Admitting: *Deleted

## 2012-07-28 ENCOUNTER — Telehealth: Payer: Self-pay | Admitting: *Deleted

## 2012-07-28 DIAGNOSIS — E876 Hypokalemia: Secondary | ICD-10-CM

## 2012-07-28 LAB — CEA: CEA: 4.6 ng/mL (ref 0.0–5.0)

## 2012-07-28 NOTE — Addendum Note (Signed)
Addended by: Caren Griffins on: 07/28/2012 01:03 PM   Modules accepted: Orders

## 2012-07-28 NOTE — Telephone Encounter (Signed)
Message copied by Caren Griffins on Fri Jul 28, 2012 12:09 PM ------      Message from: Wandalee Ferdinand      Created: Fri Jul 28, 2012 10:45 AM                   ----- Message -----         From: [name], MD         Sent: 07/27/2012   9:40 PM           To: Wandalee Ferdinand, RN, Glori Luis, RN, #            Please call patient, K+ is low , increase KCL to bid, check bmet next lab

## 2012-07-28 NOTE — Telephone Encounter (Signed)
Spoke to pt, she verbalized understanding regarding K BID.     Correction with previous note-- BMET to be drawn on 08/10/12, not 08/28/12.  Appropriate orders placed in EPIC.

## 2012-07-28 NOTE — Telephone Encounter (Signed)
Left msg with instructions per Dr Truett Perna regarding increasing K to BID, gave call back # if she has any questions.    BMET added to f/u scheduled 2/17.  SLJ

## 2012-07-31 ENCOUNTER — Other Ambulatory Visit: Payer: Self-pay | Admitting: *Deleted

## 2012-07-31 ENCOUNTER — Telehealth: Payer: Self-pay | Admitting: *Deleted

## 2012-07-31 DIAGNOSIS — C182 Malignant neoplasm of ascending colon: Secondary | ICD-10-CM

## 2012-07-31 NOTE — Telephone Encounter (Signed)
Called pt, [name] results given. Informed her we will repeat [name] at next office visit. Next appt confirmed. She voiced understanding.

## 2012-08-10 ENCOUNTER — Other Ambulatory Visit: Payer: Medicare Other

## 2012-08-10 ENCOUNTER — Telehealth: Payer: Self-pay | Admitting: Oncology

## 2012-08-10 NOTE — Telephone Encounter (Signed)
Pt called and r/s labs from today to tomorrow 08/11/12

## 2012-08-11 ENCOUNTER — Other Ambulatory Visit (HOSPITAL_BASED_OUTPATIENT_CLINIC_OR_DEPARTMENT_OTHER): Payer: Medicare Other | Admitting: Lab

## 2012-08-11 DIAGNOSIS — C180 Malignant neoplasm of cecum: Secondary | ICD-10-CM

## 2012-08-11 DIAGNOSIS — E876 Hypokalemia: Secondary | ICD-10-CM | POA: Diagnosis not present

## 2012-08-11 DIAGNOSIS — C182 Malignant neoplasm of ascending colon: Secondary | ICD-10-CM

## 2012-08-11 LAB — BASIC METABOLIC PANEL (CC13)
BUN: 7.7 mg/dL (ref 7.0–26.0)
Chloride: 106 mEq/L (ref 98–107)
Glucose: 195 mg/dl — ABNORMAL HIGH (ref 70–99)
Potassium: 3.8 mEq/L (ref 3.5–5.1)
Sodium: 138 mEq/L (ref 136–145)

## 2012-08-11 LAB — CBC WITH DIFFERENTIAL/PLATELET
BASO%: 0.8 % (ref 0.0–2.0)
EOS%: 4.5 % (ref 0.0–7.0)
HCT: 36.5 % (ref 34.8–46.6)
LYMPH%: 41.6 % (ref 14.0–49.7)
MCH: 30.3 pg (ref 25.1–34.0)
MCHC: 33.5 g/dL (ref 31.5–36.0)
NEUT#: 1.2 10*3/uL — ABNORMAL LOW (ref 1.5–6.5)
NEUT%: 36.1 % — ABNORMAL LOW (ref 38.4–76.8)
RBC: 4.05 10*6/uL (ref 3.70–5.45)
WBC: 3.2 10*3/uL — ABNORMAL LOW (ref 3.9–10.3)
lymph#: 1.3 10*3/uL (ref 0.9–3.3)

## 2012-08-18 ENCOUNTER — Telehealth: Payer: Self-pay | Admitting: *Deleted

## 2012-08-18 DIAGNOSIS — C182 Malignant neoplasm of ascending colon: Secondary | ICD-10-CM

## 2012-08-18 NOTE — Telephone Encounter (Signed)
Call from pt requesting lab results. Results given. Asking if she needs to continue Potassium. She is no longer having diarrhea. OK to discontinue, will recheck at next office visit. She voiced understanding.

## 2012-08-28 ENCOUNTER — Telehealth: Payer: Self-pay | Admitting: Oncology

## 2012-08-28 ENCOUNTER — Other Ambulatory Visit: Payer: Self-pay | Admitting: *Deleted

## 2012-08-28 ENCOUNTER — Ambulatory Visit (HOSPITAL_BASED_OUTPATIENT_CLINIC_OR_DEPARTMENT_OTHER): Payer: Medicare Other | Admitting: Oncology

## 2012-08-28 ENCOUNTER — Other Ambulatory Visit (HOSPITAL_BASED_OUTPATIENT_CLINIC_OR_DEPARTMENT_OTHER): Payer: Medicare Other | Admitting: Lab

## 2012-08-28 VITALS — BP 141/70 | HR 76 | Temp 98.1°F | Resp 18 | Ht 64.0 in | Wt 204.8 lb

## 2012-08-28 DIAGNOSIS — C182 Malignant neoplasm of ascending colon: Secondary | ICD-10-CM

## 2012-08-28 DIAGNOSIS — C180 Malignant neoplasm of cecum: Secondary | ICD-10-CM

## 2012-08-28 DIAGNOSIS — R110 Nausea: Secondary | ICD-10-CM

## 2012-08-28 DIAGNOSIS — E119 Type 2 diabetes mellitus without complications: Secondary | ICD-10-CM

## 2012-08-28 DIAGNOSIS — L270 Generalized skin eruption due to drugs and medicaments taken internally: Secondary | ICD-10-CM | POA: Diagnosis not present

## 2012-08-28 LAB — CBC WITH DIFFERENTIAL/PLATELET
Eosinophils Absolute: 0.2 10*3/uL (ref 0.0–0.5)
HCT: 36.7 % (ref 34.8–46.6)
LYMPH%: 37.2 % (ref 14.0–49.7)
MCV: 90.2 fL (ref 79.5–101.0)
MONO#: 0.7 10*3/uL (ref 0.1–0.9)
MONO%: 17.3 % — ABNORMAL HIGH (ref 0.0–14.0)
NEUT#: 1.5 10*3/uL (ref 1.5–6.5)
NEUT%: 39.8 % (ref 38.4–76.8)
Platelets: 117 10*3/uL — ABNORMAL LOW (ref 145–400)
RBC: 4.07 10*6/uL (ref 3.70–5.45)
WBC: 3.8 10*3/uL — ABNORMAL LOW (ref 3.9–10.3)

## 2012-08-28 LAB — BASIC METABOLIC PANEL (CC13)
BUN: 12 mg/dL (ref 7.0–26.0)
CO2: 25 mEq/L (ref 22–29)
Calcium: 9.3 mg/dL (ref 8.4–10.4)
Creatinine: 1 mg/dL (ref 0.6–1.1)
Glucose: 200 mg/dl — ABNORMAL HIGH (ref 70–99)

## 2012-08-28 NOTE — Telephone Encounter (Signed)
gv and printed appt schedule for pt for Feb, April and May...gv pt barium and advised that central scheduling will call with d/t of ct

## 2012-08-28 NOTE — Progress Notes (Signed)
   [facility]    OFFICE PROGRESS NOTE   INTERVAL HISTORY:   She returns as scheduled. She completed a final cycle of chemotherapy on 07/27/2012. The skin hyperpigmentation and neuropathy symptoms have improved. She feels well.   Objective:  Vital signs in last 24 hours:  Blood pressure 141/70, pulse 76, temperature 98.1 F (36.7 C), temperature source Oral, resp. rate 18, height 5\' 4"  (1.626 m), weight 204 lb 12.8 oz (92.897 kg).    HEENT: No thrush or ulcers Resp: Lungs clear bilaterally Cardio: Regular rate and rhythm GI: No hepatomegaly, mild tenderness at the right abdominal scar, no mass Vascular: No leg edema Skin: Improved hyperpigmentation at the hands   Portacath/PICC-without erythema  Lab Results:  Lab Results  Component Value Date   WBC 3.8* 08/28/2012   HGB 12.5 08/28/2012   HCT 36.7 08/28/2012   MCV 90.2 08/28/2012   PLT 117* 08/28/2012   ANC 1.5  [name] on 07/27/2012-4.6    Medications: I have reviewed the patient's current medications.  Assessment/Plan: 1. 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 She began adjuvant CAPOX chemotherapy on 02/24/2012. She completed cycle 8 on 07/27/2012. 2. Delayed nausea following cycle 1 CAPOX. Aloxi was added beginning with cycle 2 with improvement. 3. Diabetes. 4. Microcytic anemia. Hemoglobin is normal. She will discontinue iron. 5. Remote history of a "colon tumor" removed endoscopically in 1980. 6. Left lower lung nodule noted on a CT 02/21/2012, slightly larger than on a CT 12/03/2011. Chest CT 06/12/2012 showed the previously seen 5 mm pulmonary nodule in left lower lobe was scarcely visualized, estimated at 3 mm. No other suspicious pulmonary nodules were identified. 7. Status post Port-A-Cath placement 02/22/2012. Status post Port-A-Cath repositioning 03/14/2012. 8. Probable acute laryngopharyngeal dysesthesia following cycle 2 CAPOX. Symptoms resolved  with warming.  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 She reports improvement in the neuropathy symptoms today 10. History of anterior chest pain-she reports undergoing an evaluation by Dr. Rennis Golden. 11. 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 12. [name] at the upper end of the normal range on 07/27/2012-the [name] will be repeated when she returns on 09/07/2012   Disposition:  She has completed the planned course of adjuvant therapy. She remains in clinical remission. Ms. Cory will return for a Port-A-Cath flush and [name] on 09/07/2012 and 10/19/2012.  She will be scheduled for a restaging CT evaluation prior to an office visit in May of 2014. We will arrange for removal of the Port-A-Cath if the staging CTs are negative.   Thornton Papas, MD  08/28/2012  6:11 PM

## 2012-08-30 ENCOUNTER — Telehealth: Payer: Self-pay | Admitting: *Deleted

## 2012-08-30 NOTE — Telephone Encounter (Signed)
Notified patient that her K+ level was slightly low-MD wants her to continue Ktab 20 meq daily. Call pharmacy when she needs refill.

## 2012-09-07 ENCOUNTER — Other Ambulatory Visit: Payer: Medicare Other | Admitting: Lab

## 2012-09-07 ENCOUNTER — Ambulatory Visit (HOSPITAL_BASED_OUTPATIENT_CLINIC_OR_DEPARTMENT_OTHER): Payer: Medicare Other

## 2012-09-07 VITALS — BP 126/81 | HR 76 | Temp 97.7°F

## 2012-09-07 DIAGNOSIS — Z452 Encounter for adjustment and management of vascular access device: Secondary | ICD-10-CM

## 2012-09-07 DIAGNOSIS — C189 Malignant neoplasm of colon, unspecified: Secondary | ICD-10-CM

## 2012-09-07 DIAGNOSIS — C182 Malignant neoplasm of ascending colon: Secondary | ICD-10-CM

## 2012-09-07 DIAGNOSIS — C180 Malignant neoplasm of cecum: Secondary | ICD-10-CM

## 2012-09-07 LAB — CEA: CEA: 3.8 ng/mL (ref 0.0–5.0)

## 2012-09-07 MED ORDER — SODIUM CHLORIDE 0.9 % IJ SOLN
10.0000 mL | INTRAMUSCULAR | Status: DC | PRN
  Administered 2012-09-07: 10 mL via INTRAVENOUS
  Filled 2012-09-07: qty 10

## 2012-09-07 MED ORDER — HEPARIN SOD (PORK) LOCK FLUSH 100 UNIT/ML IV SOLN
500.0000 [IU] | Freq: Once | INTRAVENOUS | Status: AC
  Administered 2012-09-07: 500 [IU] via INTRAVENOUS
  Filled 2012-09-07: qty 5

## 2012-09-15 ENCOUNTER — Telehealth: Payer: Self-pay | Admitting: *Deleted

## 2012-09-15 NOTE — Telephone Encounter (Signed)
[name] results given to pt per Dr. Truett Perna.  Pt verbalized understanding and expressed appreciation for call.

## 2012-09-18 ENCOUNTER — Telehealth: Payer: Self-pay | Admitting: *Deleted

## 2012-09-18 NOTE — Telephone Encounter (Signed)
Call from pt reporting bright red blood after bowel movement. Scant amount on paper after wiping. No change in stool. No abdominal pain. Has noticed this x 2 days. Asking if [name] result suggested her cancer is coming back. Informed her [name] was normal. She voiced understanding.

## 2012-09-19 NOTE — Telephone Encounter (Signed)
Per Dr. Truett Perna: have pt contact GI doctor. May be due for F/U colonoscopy. Pt made aware, she voiced understanding.

## 2012-09-26 DIAGNOSIS — E7800 Pure hypercholesterolemia, unspecified: Secondary | ICD-10-CM | POA: Diagnosis not present

## 2012-09-26 DIAGNOSIS — I10 Essential (primary) hypertension: Secondary | ICD-10-CM | POA: Diagnosis not present

## 2012-09-26 DIAGNOSIS — G609 Hereditary and idiopathic neuropathy, unspecified: Secondary | ICD-10-CM | POA: Diagnosis not present

## 2012-10-19 ENCOUNTER — Ambulatory Visit (HOSPITAL_BASED_OUTPATIENT_CLINIC_OR_DEPARTMENT_OTHER): Payer: Medicare Other

## 2012-10-19 ENCOUNTER — Other Ambulatory Visit (HOSPITAL_BASED_OUTPATIENT_CLINIC_OR_DEPARTMENT_OTHER): Payer: Medicare Other | Admitting: Lab

## 2012-10-19 ENCOUNTER — Ambulatory Visit (INDEPENDENT_AMBULATORY_CARE_PROVIDER_SITE_OTHER): Payer: Medicare Other | Admitting: Surgery

## 2012-10-19 VITALS — BP 132/80 | HR 64 | Temp 98.0°F | Resp 18 | Ht 63.0 in | Wt 209.6 lb

## 2012-10-19 VITALS — BP 142/74 | HR 77 | Temp 98.3°F

## 2012-10-19 DIAGNOSIS — Z6837 Body mass index (BMI) 37.0-37.9, adult: Secondary | ICD-10-CM | POA: Diagnosis not present

## 2012-10-19 DIAGNOSIS — Z9049 Acquired absence of other specified parts of digestive tract: Secondary | ICD-10-CM

## 2012-10-19 DIAGNOSIS — C801 Malignant (primary) neoplasm, unspecified: Secondary | ICD-10-CM

## 2012-10-19 DIAGNOSIS — Z452 Encounter for adjustment and management of vascular access device: Secondary | ICD-10-CM

## 2012-10-19 DIAGNOSIS — C180 Malignant neoplasm of cecum: Secondary | ICD-10-CM

## 2012-10-19 DIAGNOSIS — C182 Malignant neoplasm of ascending colon: Secondary | ICD-10-CM | POA: Diagnosis not present

## 2012-10-19 LAB — BASIC METABOLIC PANEL (CC13)
BUN: 10.2 mg/dL (ref 7.0–26.0)
Potassium: 3.8 mEq/L (ref 3.5–5.1)

## 2012-10-19 MED ORDER — SODIUM CHLORIDE 0.9 % IJ SOLN
10.0000 mL | INTRAMUSCULAR | Status: DC | PRN
  Administered 2012-10-19: 10 mL via INTRAVENOUS
  Filled 2012-10-19: qty 10

## 2012-10-19 MED ORDER — HEPARIN SOD (PORK) LOCK FLUSH 100 UNIT/ML IV SOLN
500.0000 [IU] | Freq: Once | INTRAVENOUS | Status: AC
  Administered 2012-10-19: 500 [IU] via INTRAVENOUS
  Filled 2012-10-19: qty 5

## 2012-10-19 NOTE — Progress Notes (Signed)
Meredeth Ide 72 y.o.  Body mass index is 37.14 kg/(m^2).  Patient Active Problem List  Diagnosis  . Gout  . Diabetes mellitus  . Hypertension  . Hyperlipidemia  . Cancer of the ileocecal junction  . S/P right colectomy    Allergies  Allergen Reactions  . Aspirin Palpitations    Past Surgical History  Procedure Laterality Date  . Cholecystectomy    . Abdominal hysterectomy    . Colon surgery      Tumor  . Esophagogastroduodenoscopy  06/16/2011    Procedure: ESOPHAGOGASTRODUODENOSCOPY (EGD);  Surgeon: Malissa Hippo, MD;  Location: [facility];  Service: Endoscopy;  Laterality: N/A;  2:15  . Colonoscopy  11/26/2011    Procedure: COLONOSCOPY;  Surgeon: Malissa Hippo, MD;  Location: [facility];  Service: Endoscopy;  Laterality: N/A;  2:15  . Appendectomy    . Cardiac catheterization  2001,2012  . Portacath placement  02/22/2012    Procedure: INSERTION PORT-A-CATH;  Surgeon: Valarie Merino, MD;  Location: [facility];  Service: General;  Laterality: N/A;  left subclavian   [name], MD No diagnosis found.  Mrs. Racette has some soreness in her incision. On Valsalva I do not feel a hernia. This is in a roll of fatty tissue and it could be she has some fat compression. I will see her again after she has her subsequent followup studies and a release her to remove her Port-A-Cath. Matt B. Daphine Deutscher, MD, [facility] [phone] beeper [phone]  10/19/2012 11:38 AM

## 2012-10-19 NOTE — Patient Instructions (Signed)
Follow up for port removal after colonscopy and CT scan

## 2012-10-20 LAB — CEA: CEA: 3.4 ng/mL (ref 0.0–5.0)

## 2012-10-23 DIAGNOSIS — R069 Unspecified abnormalities of breathing: Secondary | ICD-10-CM | POA: Diagnosis not present

## 2012-10-23 DIAGNOSIS — R079 Chest pain, unspecified: Secondary | ICD-10-CM | POA: Diagnosis not present

## 2012-10-23 DIAGNOSIS — E669 Obesity, unspecified: Secondary | ICD-10-CM | POA: Diagnosis not present

## 2012-10-31 DIAGNOSIS — E119 Type 2 diabetes mellitus without complications: Secondary | ICD-10-CM | POA: Diagnosis not present

## 2012-10-31 DIAGNOSIS — M353 Polymyalgia rheumatica: Secondary | ICD-10-CM | POA: Diagnosis not present

## 2012-11-03 DIAGNOSIS — E119 Type 2 diabetes mellitus without complications: Secondary | ICD-10-CM | POA: Diagnosis not present

## 2012-11-03 DIAGNOSIS — H524 Presbyopia: Secondary | ICD-10-CM | POA: Diagnosis not present

## 2012-11-03 DIAGNOSIS — H52229 Regular astigmatism, unspecified eye: Secondary | ICD-10-CM | POA: Diagnosis not present

## 2012-11-03 DIAGNOSIS — H5231 Anisometropia: Secondary | ICD-10-CM | POA: Diagnosis not present

## 2012-11-07 ENCOUNTER — Telehealth (INDEPENDENT_AMBULATORY_CARE_PROVIDER_SITE_OTHER): Payer: Self-pay | Admitting: *Deleted

## 2012-11-07 NOTE — Telephone Encounter (Signed)
Charina said Dr. Daphine Deutscher wanted her to call Dr. Karilyn Cota and see if he would do another TCS to make sure there was no signs of cancer.  Chelsi just finished her chemo. The return phone number is [phone].

## 2012-11-08 NOTE — Telephone Encounter (Signed)
I talked with the patient and she states that she saw Dr.Martin in March and thought they  Were going to send Dr.Rehman notes. She is to have her CT on May 19 th , and Dr.Martin wanted her to have a Colonoscopy after it was done. Patient was advised that this would be sent to Dr. Karilyn Cota and once he addressed it , we would call her back.

## 2012-11-10 NOTE — Telephone Encounter (Signed)
Yes patient will need colonoscopy this month.

## 2012-11-10 NOTE — Telephone Encounter (Signed)
To Ann.

## 2012-11-10 NOTE — Telephone Encounter (Signed)
Forwadred to [place] to arrange

## 2012-11-13 ENCOUNTER — Telehealth (INDEPENDENT_AMBULATORY_CARE_PROVIDER_SITE_OTHER): Payer: Self-pay | Admitting: *Deleted

## 2012-11-13 ENCOUNTER — Other Ambulatory Visit (INDEPENDENT_AMBULATORY_CARE_PROVIDER_SITE_OTHER): Payer: Self-pay | Admitting: *Deleted

## 2012-11-13 DIAGNOSIS — Z85038 Personal history of other malignant neoplasm of large intestine: Secondary | ICD-10-CM

## 2012-11-13 DIAGNOSIS — Z1211 Encounter for screening for malignant neoplasm of colon: Secondary | ICD-10-CM

## 2012-11-13 MED ORDER — PEG-KCL-NACL-NASULF-NA ASC-C 100 G PO SOLR: 1.0000 | Freq: Once | ORAL | Status: DC

## 2012-11-13 NOTE — Telephone Encounter (Signed)
TCS sch'd 12/08/12, patient aware

## 2012-11-13 NOTE — Telephone Encounter (Signed)
  Procedure: tcs  Reason/Indication:  Hx colon ca  Has patient had this procedure before?  Yes, 11/2011  If so, when, by whom and where?    Is there a family history of colon cancer?    Who?  What age when diagnosed?    Is patient diabetic?   yes      Does patient have prosthetic heart valve?  no  Do you have a pacemaker?  no  Has patient ever had endocarditis? no  Has patient had joint replacement within last 12 months?  no  Is patient on Coumadin, Plavix and/or Aspirin? no  Medications: Tylenol bid prn, amlodipine 5 mg daily, nexium 40 mg daily, ferrous sulfate 325 mg bid, fexofenadine 180 mg prn, gabapentin 300 mg tid, hyoscyamine 125 mg prn, lidocaine cream -prilocaine (porta cath) 1-2 hours prior to use of porta cath, linagliptin 5 mg (tradjenta) daily in am (DM), oxycodone 5/325 mg 1 every 4 hours prn, propranolol/hctz 40/25 mg daily, ramipril 5 mg daily, simvastatin 40 mg daily, tramadol 50 mg tid  Allergies: aspirin  Medication Adjustment: hold iron 10 days, hold morning dose of Tradjenta morning of procedure  Procedure date & time: 12/08/12 at 815

## 2012-11-13 NOTE — Telephone Encounter (Signed)
agree

## 2012-11-15 DIAGNOSIS — M353 Polymyalgia rheumatica: Secondary | ICD-10-CM | POA: Diagnosis not present

## 2012-11-15 DIAGNOSIS — E119 Type 2 diabetes mellitus without complications: Secondary | ICD-10-CM | POA: Diagnosis not present

## 2012-11-16 ENCOUNTER — Encounter: Payer: Self-pay | Admitting: Internal Medicine

## 2012-11-24 ENCOUNTER — Other Ambulatory Visit: Payer: Self-pay | Admitting: *Deleted

## 2012-11-24 DIAGNOSIS — C182 Malignant neoplasm of ascending colon: Secondary | ICD-10-CM

## 2012-11-27 ENCOUNTER — Ambulatory Visit (HOSPITAL_COMMUNITY)
Admission: RE | Admit: 2012-11-27 | Discharge: 2012-11-27 | Disposition: A | Discharge status: Home / Self Care | Payer: Medicare Other | Source: Ambulatory Visit | Attending: Oncology | Admitting: Oncology

## 2012-11-27 ENCOUNTER — Other Ambulatory Visit (HOSPITAL_BASED_OUTPATIENT_CLINIC_OR_DEPARTMENT_OTHER): Payer: Medicare Other | Admitting: Lab

## 2012-11-27 DIAGNOSIS — C189 Malignant neoplasm of colon, unspecified: Secondary | ICD-10-CM | POA: Diagnosis not present

## 2012-11-27 DIAGNOSIS — R911 Solitary pulmonary nodule: Secondary | ICD-10-CM | POA: Diagnosis not present

## 2012-11-27 DIAGNOSIS — R918 Other nonspecific abnormal finding of lung field: Secondary | ICD-10-CM | POA: Diagnosis not present

## 2012-11-27 DIAGNOSIS — C182 Malignant neoplasm of ascending colon: Secondary | ICD-10-CM

## 2012-11-27 DIAGNOSIS — Z9221 Personal history of antineoplastic chemotherapy: Secondary | ICD-10-CM | POA: Diagnosis not present

## 2012-11-27 DIAGNOSIS — C180 Malignant neoplasm of cecum: Secondary | ICD-10-CM | POA: Diagnosis not present

## 2012-11-27 DIAGNOSIS — K5730 Diverticulosis of large intestine without perforation or abscess without bleeding: Secondary | ICD-10-CM | POA: Diagnosis not present

## 2012-11-27 DIAGNOSIS — Z9049 Acquired absence of other specified parts of digestive tract: Secondary | ICD-10-CM | POA: Diagnosis not present

## 2012-11-27 LAB — COMPREHENSIVE METABOLIC PANEL (CC13)
Albumin: 3.2 g/dL — ABNORMAL LOW (ref 3.5–5.0)
BUN: 14.8 mg/dL (ref 7.0–26.0)
CO2: 25 mEq/L (ref 22–29)
Calcium: 9.4 mg/dL (ref 8.4–10.4)
Chloride: 101 mEq/L (ref 98–107)
Creatinine: 0.9 mg/dL (ref 0.6–1.1)
Glucose: 128 mg/dl — ABNORMAL HIGH (ref 70–99)
Potassium: 3.7 mEq/L (ref 3.5–5.1)

## 2012-11-27 MED ORDER — IOHEXOL 300 MG/ML  SOLN
100.0000 mL | Freq: Once | INTRAMUSCULAR | Status: AC | PRN
  Administered 2012-11-27: 100 mL via INTRAVENOUS

## 2012-11-28 ENCOUNTER — Telehealth: Payer: Self-pay | Admitting: Oncology

## 2012-11-28 ENCOUNTER — Ambulatory Visit (HOSPITAL_BASED_OUTPATIENT_CLINIC_OR_DEPARTMENT_OTHER): Payer: Medicare Other | Admitting: Nurse Practitioner

## 2012-11-28 VITALS — BP 148/66 | HR 74 | Temp 98.3°F | Resp 18 | Ht 63.0 in | Wt 208.1 lb

## 2012-11-28 DIAGNOSIS — R911 Solitary pulmonary nodule: Secondary | ICD-10-CM

## 2012-11-28 DIAGNOSIS — E119 Type 2 diabetes mellitus without complications: Secondary | ICD-10-CM

## 2012-11-28 DIAGNOSIS — C180 Malignant neoplasm of cecum: Secondary | ICD-10-CM | POA: Diagnosis not present

## 2012-11-28 DIAGNOSIS — C182 Malignant neoplasm of ascending colon: Secondary | ICD-10-CM

## 2012-11-28 NOTE — Telephone Encounter (Signed)
gv and printed appt sched and avs for pt  °

## 2012-11-28 NOTE — Progress Notes (Signed)
OFFICE PROGRESS NOTE  Interval history:  Peggy French returns as scheduled. Overall she is feeling well. She has a good appetite and good energy level. No change in bowel habits. No bloody or black stools. She reports she is scheduled for a routine colonoscopy next week. She has mild intermittent numbness in the fingers and toes. She has occasional abdominal pain. Stable mild dyspnea on exertion. She has never smoked.   Objective: Blood pressure 148/66, pulse 74, temperature 98.3 F (36.8 C), temperature source Oral, resp. rate 18, height 5\' 3"  (1.6 m), weight 208 lb 1.6 oz (94.394 kg).  No thrush or ulcerations. No palpable cervical, supraclavicular, axillary or inguinal lymph nodes. Lungs are clear. No wheezes or rales. Regular cardiac rhythm. Port-A-Cath site without erythema. Abdomen is soft. No hepatomegaly. No mass. Extremities are without edema.  Lab Results: Lab Results  Component Value Date   WBC 3.8* 08/28/2012   HGB 12.5 08/28/2012   HCT 36.7 08/28/2012   MCV 90.2 08/28/2012   PLT 117* 08/28/2012    Chemistry:    Chemistry      Component Value Date/Time   NA 138 11/27/2012 0807   NA 139 02/29/2012 1051   K 3.7 11/27/2012 0807   K 3.7 02/29/2012 1051   CL 101 11/27/2012 0807   CL 97 02/24/2012 1120   CO2 25 11/27/2012 0807   CO2 28 02/24/2012 1120   BUN 14.8 11/27/2012 0807   BUN 7 02/24/2012 1120   CREATININE 0.9 11/27/2012 0807   CREATININE 0.65 02/24/2012 1120   CREATININE 0.91 12/02/2011 1159      Component Value Date/Time   CALCIUM 9.4 11/27/2012 0807   CALCIUM 9.1 02/24/2012 1120   ALKPHOS 117 11/27/2012 0807   ALKPHOS 88 02/24/2012 1120   AST 29 11/27/2012 0807   AST 73* 02/24/2012 1120   ALT 15 11/27/2012 0807   ALT 13 02/24/2012 1120   BILITOT 0.40 11/27/2012 0807   BILITOT 0.3 02/24/2012 1120       Studies/Results: Ct Chest W Contrast  11/27/2012   *RADIOLOGY REPORT*  Clinical Data:  Colon cancer diagnosed 1980.  Chemotherapy complete.  Prior colon resection.  CT  CHEST, ABDOMEN AND PELVIS WITH CONTRAST  Technique:  Multidetector CT imaging of the chest, abdomen and pelvis was performed following the standard protocol during bolus administration of intravenous contrast.  Contrast: OMNIPAQUE IOHEXOL 300 MG/ML  SOLN  Comparison:  CT 06/12/2012 and  CT CHEST  Findings:  The left lower pulmonary nodules is increased in the interval measuring 7 mm x 8 mm (image 36, series 5) compared to 5 mm x 6 mm on CT 02/21/2012 and 3 mm on CT of 06/12/2012  There are no new pulmonary nodules.  No axillary or supraclavicular lymphadenopathy.  No mediastinal or hilar lymphadenopathy.  No pericardial fluid.  There is a port in the left anterior chest wall.  Esophagus is normal.  IMPRESSION:  1.  Interval enlargement of left lower lobe pulmonary nodule is concerning for pulmonary metastasis versus primary bronchogenic carcinoma. The small size of nodule  and proximity to the diaphragm would make biopsy difficult and may limit accurate FDG PET characterization.  Consider short-term CT follow-up versus FDG PET CT.  CT ABDOMEN AND PELVIS  Findings:  No focal hepatic lesion.  Gallbladder is surgically absent.  The pancreas, spleen, adrenal glands, and kidneys are normal.  The stomach, small bowel, and cecum are normal.  The colon and rectosigmoid colon are normal.  Abdominal aorta is normal  caliber.  No retroperitoneal periportal lymphadenopathy.  No free fluid the pelvis.  No pelvic lymphadenopathy.  Post hysterectomy anatomy.  The ovaries are normal.  Normal bladder. There is several diverticula  of the sigmoid colon without acute inflammation.  There is a partial right hemicolectomy anatomy.  No peritoneal disease.  IMPRESSION:  1.  No evidence of local colon cancer recurrence. 2.  No evidence of metastasis. 3.  Right hemicolectomy anatomy.   Original Report Authenticated By: Genevive Bi, M.D.   Ct Abdomen Pelvis W Contrast  11/27/2012   *RADIOLOGY REPORT*  Clinical Data:  Colon cancer  diagnosed 1980.  Chemotherapy complete.  Prior colon resection.  CT CHEST, ABDOMEN AND PELVIS WITH CONTRAST  Technique:  Multidetector CT imaging of the chest, abdomen and pelvis was performed following the standard protocol during bolus administration of intravenous contrast.  Contrast: OMNIPAQUE IOHEXOL 300 MG/ML  SOLN  Comparison:  CT 06/12/2012 and  CT CHEST  Findings:  The left lower pulmonary nodules is increased in the interval measuring 7 mm x 8 mm (image 36, series 5) compared to 5 mm x 6 mm on CT 02/21/2012 and 3 mm on CT of 06/12/2012  There are no new pulmonary nodules.  No axillary or supraclavicular lymphadenopathy.  No mediastinal or hilar lymphadenopathy.  No pericardial fluid.  There is a port in the left anterior chest wall.  Esophagus is normal.  IMPRESSION:  1.  Interval enlargement of left lower lobe pulmonary nodule is concerning for pulmonary metastasis versus primary bronchogenic carcinoma. The small size of nodule  and proximity to the diaphragm would make biopsy difficult and may limit accurate FDG PET characterization.  Consider short-term CT follow-up versus FDG PET CT.  CT ABDOMEN AND PELVIS  Findings:  No focal hepatic lesion.  Gallbladder is surgically absent.  The pancreas, spleen, adrenal glands, and kidneys are normal.  The stomach, small bowel, and cecum are normal.  The colon and rectosigmoid colon are normal.  Abdominal aorta is normal caliber.  No retroperitoneal periportal lymphadenopathy.  No free fluid the pelvis.  No pelvic lymphadenopathy.  Post hysterectomy anatomy.  The ovaries are normal.  Normal bladder. There is several diverticula  of the sigmoid colon without acute inflammation.  There is a partial right hemicolectomy anatomy.  No peritoneal disease.  IMPRESSION:  1.  No evidence of local colon cancer recurrence. 2.  No evidence of metastasis. 3.  Right hemicolectomy anatomy.   Original Report Authenticated By: Genevive Bi, M.D.    Medications: I have  reviewed the patient's current medications.  Assessment/Plan:  1. 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 She began adjuvant CAPOX chemotherapy on 02/24/2012. She completed cycle 8 on 07/27/2012. 2. Delayed nausea following cycle 1 CAPOX. Aloxi was added beginning with cycle 2 with improvement. 3. Diabetes. 4. Microcytic anemia. Hemoglobin is normal. She will discontinue iron. 5. Remote history of a "colon tumor" removed endoscopically in 1980. 6. Left lower lung nodule noted on a CT 02/21/2012, slightly larger than on a CT 12/03/2011. Chest CT 06/12/2012 showed the previously seen 5 mm pulmonary nodule in left lower lobe was scarcely visualized, estimated at 3 mm. No other suspicious pulmonary nodules were identified. Chest CT 11/27/2012 showed interval enlargement of the left lower lobe pulmonary nodule with a current measurement of 7 mm x 8 mm. No new pulmonary nodules. 7. Status post Port-A-Cath placement 02/22/2012. Status post Port-A-Cath repositioning 03/14/2012. 8. Probable acute laryngopharyngeal dysesthesia following cycle 2 CAPOX.  Symptoms resolved with warming.  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 She reports improvement in the neuropathy symptoms today. 10. History of anterior chest pain-she reports undergoing an evaluation by Dr. Rennis Golden. 11. 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 12. [name] at the upper end of the normal range on 07/27/2012; 3.8 on 09/07/2012; 3.4 on 10/19/2012.  Position-Ms. Mcglaun appears stable. Dr. Truett Perna reviewed the chest CT images on the computer with Ms. Hrivnak and her husband. Dr. Truett Perna will present her case at the upcoming GI tumor conference to decide on observation with a repeat chest CT at a three-month interval versus a referral to thoracic surgery. We will contact her with the recommendation. She will return for a followup visit in 3  months. That appointment will be adjusted accordingly pending the consensus opinion from the GI tumor conference.  She will contact the office prior to her next visit with any problems.  Patient seen with Dr. Truett Perna.  Lonna Cobb ANP/GNP-BC

## 2012-12-08 ENCOUNTER — Encounter (HOSPITAL_COMMUNITY)
Admission: RE | Disposition: A | Discharge status: Home / Self Care | Payer: Self-pay | Source: Ambulatory Visit | Attending: Internal Medicine

## 2012-12-08 ENCOUNTER — Encounter (HOSPITAL_COMMUNITY): Payer: Self-pay | Admitting: *Deleted

## 2012-12-08 ENCOUNTER — Ambulatory Visit (HOSPITAL_COMMUNITY)
Admission: RE | Admit: 2012-12-08 | Discharge: 2012-12-08 | Disposition: A | Discharge status: Home / Self Care | Payer: Medicare Other | Source: Ambulatory Visit | Attending: Internal Medicine | Admitting: Internal Medicine

## 2012-12-08 DIAGNOSIS — K644 Residual hemorrhoidal skin tags: Secondary | ICD-10-CM

## 2012-12-08 DIAGNOSIS — I10 Essential (primary) hypertension: Secondary | ICD-10-CM | POA: Insufficient documentation

## 2012-12-08 DIAGNOSIS — Z9049 Acquired absence of other specified parts of digestive tract: Secondary | ICD-10-CM | POA: Insufficient documentation

## 2012-12-08 DIAGNOSIS — K648 Other hemorrhoids: Secondary | ICD-10-CM | POA: Diagnosis not present

## 2012-12-08 DIAGNOSIS — Z09 Encounter for follow-up examination after completed treatment for conditions other than malignant neoplasm: Secondary | ICD-10-CM | POA: Diagnosis not present

## 2012-12-08 DIAGNOSIS — Z85038 Personal history of other malignant neoplasm of large intestine: Secondary | ICD-10-CM | POA: Diagnosis not present

## 2012-12-08 DIAGNOSIS — Z79899 Other long term (current) drug therapy: Secondary | ICD-10-CM | POA: Diagnosis not present

## 2012-12-08 DIAGNOSIS — K633 Ulcer of intestine: Secondary | ICD-10-CM | POA: Diagnosis not present

## 2012-12-08 DIAGNOSIS — K6389 Other specified diseases of intestine: Secondary | ICD-10-CM | POA: Diagnosis not present

## 2012-12-08 DIAGNOSIS — E119 Type 2 diabetes mellitus without complications: Secondary | ICD-10-CM | POA: Diagnosis not present

## 2012-12-08 HISTORY — PX: COLONOSCOPY: SHX5424

## 2012-12-08 SURGERY — COLONOSCOPY
Anesthesia: Moderate Sedation

## 2012-12-08 MED ORDER — MIDAZOLAM HCL 5 MG/5ML IJ SOLN
INTRAMUSCULAR | Status: DC | PRN
  Administered 2012-12-08 (×3): 2 mg via INTRAVENOUS

## 2012-12-08 MED ORDER — MEPERIDINE HCL 50 MG/ML IJ SOLN
INTRAMUSCULAR | Status: AC
  Filled 2012-12-08: qty 1

## 2012-12-08 MED ORDER — SODIUM CHLORIDE 0.9 % IV SOLN
INTRAVENOUS | Status: DC
  Administered 2012-12-08: 10:00:00 via INTRAVENOUS

## 2012-12-08 MED ORDER — MIDAZOLAM HCL 5 MG/5ML IJ SOLN
INTRAMUSCULAR | Status: AC
  Filled 2012-12-08: qty 10

## 2012-12-08 MED ORDER — MEPERIDINE HCL 50 MG/ML IJ SOLN
INTRAMUSCULAR | Status: DC | PRN
  Administered 2012-12-08 (×2): 25 mg via INTRAVENOUS

## 2012-12-08 MED ORDER — STERILE WATER FOR IRRIGATION IR SOLN
Status: DC | PRN
  Administered 2012-12-08: 11:00:00

## 2012-12-08 NOTE — H&P (Signed)
Peggy French is an 72 y.o. female.   Chief Complaint: Patient's here for colonoscopy. HPI: Patient is 72 year old African female history of colon carcinoma right hemicolectomy in June 2013 fundus T3 N2 disease. She is doing well. She does have 7 x 8 mm  nodule in the left lung base which is being followed closely. She has occasional hematochezia felt to be secondary hemorrhoids. She generally has 2 formed stools daily. Family history significant for colon carcinoma in 3 siblings. One sister was 46 at the time of diagnosis. Past Medical History  Diagnosis Date  . Gout   . Diabetes mellitus     x over 10 yrs  . Hyperlipidemia   . GERD (gastroesophageal reflux disease)   . Chest pain   . H/O hiatal hernia   . Headache(784.0)     hx migraines  . Arthritis   . Sleep apnea     does not have CPAP machine  sleep study done on 05/19/2011  AHI during total sleep time (3h 53 min) 13.1/hr and REM sleep at 46.7/hr  . Cancer     LOV Dr Truett Perna 02/14/12 [facility]  . Asthmatic bronchitis 2011  . Hypertension     EKG 10/12 [facility], chest- 1 view  6/13 [facility]    Last 2D Echo on 05/02/2012 showed EF of greater than 55%  . Coronary artery disease   . Myocardial infarction     2001    Past Surgical History  Procedure Laterality Date  . Cholecystectomy    . Abdominal hysterectomy    . Colon surgery      Tumor  . Esophagogastroduodenoscopy  06/16/2011    Procedure: ESOPHAGOGASTRODUODENOSCOPY (EGD);  Surgeon: Malissa Hippo, MD;  [facility]: [facility];  Service: Endoscopy;  Laterality: N/A;  2:15  . Colonoscopy  11/26/2011    Procedure: COLONOSCOPY;  Surgeon: Malissa Hippo, MD;  Location: [facility];  Service: Endoscopy;  Laterality: N/A;  2:15  . Appendectomy    . Cardiac catheterization  2001,2012  . Portacath placement  02/22/2012    Procedure: INSERTION PORT-A-CATH;  Surgeon: Valarie Merino, MD;  Location: [facility];  Service: General;  Laterality: N/A;  left subclavian    Family History   Problem Relation Age of Onset  . Cancer Maternal Aunt     throat,    Social History:  reports that she has never smoked. She has never used smokeless tobacco. She reports that she does not drink alcohol or use illicit drugs.  Allergies:  Allergies  Allergen Reactions  . Aspirin Palpitations    Medications Prior to Admission  Medication Sig Dispense Refill  . acetaminophen (TYLENOL) 500 MG tablet Take 1,000 mg by mouth every 6 (six) hours as needed. For pain      . amLODipine (NORVASC) 5 MG tablet Take 5 mg by mouth daily after breakfast.       . esomeprazole (NEXIUM) 40 MG capsule Take 40 mg by mouth every evening.      . ferrous sulfate 325 (65 FE) MG tablet Take 325 mg by mouth daily with breakfast. Takes one tablet twice a day      . fexofenadine (ALLEGRA) 180 MG tablet Take 180 mg by mouth daily as needed. For Allergies      . gabapentin (NEURONTIN) 100 MG capsule Take 300 mg by mouth 3 (three) times daily.       . hyoscyamine (LEVSIN, ANASPAZ) 0.125 MG tablet Take 0.125 mg by mouth every 4 (four) hours  as needed. For cramping      . lidocaine-prilocaine (EMLA) cream Apply topically as needed. Apply to port-a-cath 1-2 hours prior to chemo.  Cover with plastic wrap  30 g  1  . linagliptin (TRADJENTA) 5 MG TABS tablet Take 5 mg by mouth daily after breakfast.       . oxyCODONE-acetaminophen (PERCOCET/ROXICET) 5-325 MG per tablet Take 1 tablet by mouth every 4 (four) hours as needed.      . peg 3350 powder (MOVIPREP) 100 G SOLR Take 1 kit (100 g total) by mouth once.  1 kit  0  . potassium chloride SA (K-DUR,KLOR-CON) 20 MEQ tablet Take 1 tablet (20 mEq total) by mouth daily.  30 tablet  3  . prochlorperazine (COMPAZINE) 10 MG tablet Take 1 tablet (10 mg total) by mouth every 6 (six) hours as needed.  30 tablet  3  . propranolol-hydrochlorothiazide (INDERIDE) 40-25 MG per tablet Take 1 tablet by mouth daily with breakfast.       . ramipril (ALTACE) 5 MG capsule Take 5 mg by mouth daily  with breakfast.       . simvastatin (ZOCOR) 40 MG tablet Take 40 mg by mouth at bedtime.         No results found for this or any previous visit (from the past 48 hour(s)). No results found.  ROS  Blood pressure 159/97, temperature 97.8 F (36.6 C), temperature source Oral, resp. rate 22, SpO2 98.00%. Physical Exam  Constitutional: She appears well-developed and well-nourished.  HENT:  Mouth/Throat: Oropharynx is clear and moist.  Eyes: No scleral icterus.  Neck: No thyromegaly present.  Cardiovascular: Normal rate, regular rhythm and normal heart sounds.   No murmur heard. Respiratory: Effort normal and breath sounds normal.  Patient has  Port-A-Cath at left pectoral region  GI:  Abdomen is full with horizontal scar right mid abdomen. No organomegaly or masses  Musculoskeletal: She exhibits no edema.  Lymphadenopathy:    She has no cervical adenopathy.  Neurological: She is alert.  Skin: Skin is warm and dry.     Assessment/Plan history of colon carcinoma. Surveillance colonoscopy  REHMAN,NAJEEB U 12/08/2012, 10:26 AM

## 2012-12-08 NOTE — Op Note (Signed)
COLONOSCOPY PROCEDURE REPORT  PATIENT:  Peggy French  MR#:  [id number] Birthdate:  28-Jul-1940, 72 y.o., female Endoscopist:  Dr. Malissa Hippo, MD Referred By:  Dr. Mirna Mires, MD Procedure Date: 12/08/2012  Procedure:   Colonoscopy  Indications: Patient is 72 year old African female with history of colon carcinoma. She status post right hemicolectomy in June 2013. She is undergoing surveillance colonoscopy. She finished chemotherapy in January 2014. She has small left lung nodule which is being monitored.  Informed Consent:  The procedure and risks were reviewed with the patient and informed consent was obtained.  Medications:  Demerol 50 mg IV Versed 6 mg IV  Description of procedure:  After a digital rectal exam was performed, that colonoscope was advanced from the anus through the rectum and colon to the area of the cecum, ileocecal valve and appendiceal orifice. The cecum was deeply intubated. These structures were well-seen and photographed for the record. From the level of the cecum and ileocecal valve, the scope was slowly and cautiously withdrawn. The mucosal surfaces were carefully surveyed utilizing scope tip to flexion to facilitate fold flattening as needed. The scope was pulled down into the rectum where a thorough exam including retroflexion was performed.  Findings:   Prep excellent. Wide-open ileocolonic anastomosis. Focal erythema and edema noted along with one area with spontaneous oozing. This was located next to the suture. Biopsy taken for routine histology. The rest of the mucosa was normal. Rectal mucosa was also normal. Small hemorrhoids noted proximal to dentate line.   Therapeutic/Diagnostic Maneuvers Performed:  See above  Complications:  None  Colon Withdrawal Time:  30 minutes  Impression:  Examination performed to the ileocolonic anastomosis. Focal abnormality anastomosis possibly related to suture reaction. Biopsy taken for histologic  confirmation. Small internal hemorrhoids. No evidence of colonic polyps or recurrent neoplasm.  Recommendations:  Standard instructions given. I will contact patient with biopsy results and further recommendations.  [name]  12/08/2012 11:00 AM  CC: Dr. [name], MD & Dr. Bonnetta Barry ref. provider found

## 2012-12-11 ENCOUNTER — Encounter (HOSPITAL_COMMUNITY): Payer: Self-pay | Admitting: Internal Medicine

## 2012-12-14 ENCOUNTER — Encounter (INDEPENDENT_AMBULATORY_CARE_PROVIDER_SITE_OTHER): Payer: Self-pay | Admitting: *Deleted

## 2012-12-27 DIAGNOSIS — G589 Mononeuropathy, unspecified: Secondary | ICD-10-CM | POA: Diagnosis not present

## 2012-12-27 DIAGNOSIS — E119 Type 2 diabetes mellitus without complications: Secondary | ICD-10-CM | POA: Diagnosis not present

## 2012-12-27 DIAGNOSIS — M353 Polymyalgia rheumatica: Secondary | ICD-10-CM | POA: Diagnosis not present

## 2013-01-08 ENCOUNTER — Ambulatory Visit (HOSPITAL_BASED_OUTPATIENT_CLINIC_OR_DEPARTMENT_OTHER): Payer: Medicare Other

## 2013-01-08 VITALS — BP 150/82 | HR 84 | Temp 97.1°F | Resp 18

## 2013-01-08 DIAGNOSIS — C189 Malignant neoplasm of colon, unspecified: Secondary | ICD-10-CM | POA: Diagnosis not present

## 2013-01-08 DIAGNOSIS — Z452 Encounter for adjustment and management of vascular access device: Secondary | ICD-10-CM

## 2013-01-08 MED ORDER — SODIUM CHLORIDE 0.9 % IJ SOLN
10.0000 mL | INTRAMUSCULAR | Status: DC | PRN
  Administered 2013-01-08: 10 mL via INTRAVENOUS
  Filled 2013-01-08: qty 10

## 2013-01-08 MED ORDER — HEPARIN SOD (PORK) LOCK FLUSH 100 UNIT/ML IV SOLN
500.0000 [IU] | Freq: Once | INTRAVENOUS | Status: AC
  Administered 2013-01-08: 500 [IU] via INTRAVENOUS
  Filled 2013-01-08: qty 5

## 2013-01-08 NOTE — Patient Instructions (Signed)
Call MD for problems or concerns 

## 2013-02-19 ENCOUNTER — Ambulatory Visit (HOSPITAL_COMMUNITY): Payer: Medicare Other

## 2013-02-19 ENCOUNTER — Other Ambulatory Visit: Payer: Medicare Other | Admitting: Lab

## 2013-02-19 ENCOUNTER — Ambulatory Visit (HOSPITAL_BASED_OUTPATIENT_CLINIC_OR_DEPARTMENT_OTHER): Payer: Medicare Other

## 2013-02-19 ENCOUNTER — Telehealth: Payer: Self-pay | Admitting: *Deleted

## 2013-02-19 VITALS — BP 146/65 | HR 71 | Temp 98.0°F | Resp 18

## 2013-02-19 DIAGNOSIS — Z452 Encounter for adjustment and management of vascular access device: Secondary | ICD-10-CM | POA: Diagnosis not present

## 2013-02-19 DIAGNOSIS — C182 Malignant neoplasm of ascending colon: Secondary | ICD-10-CM

## 2013-02-19 DIAGNOSIS — C180 Malignant neoplasm of cecum: Secondary | ICD-10-CM | POA: Diagnosis not present

## 2013-02-19 MED ORDER — HEPARIN SOD (PORK) LOCK FLUSH 100 UNIT/ML IV SOLN
500.0000 [IU] | Freq: Once | INTRAVENOUS | Status: AC
  Administered 2013-02-19: 500 [IU] via INTRAVENOUS
  Filled 2013-02-19: qty 5

## 2013-02-19 MED ORDER — SODIUM CHLORIDE 0.9 % IJ SOLN
10.0000 mL | INTRAMUSCULAR | Status: DC | PRN
  Administered 2013-02-19: 10 mL via INTRAVENOUS
  Filled 2013-02-19: qty 10

## 2013-02-19 NOTE — Telephone Encounter (Signed)
Patient was FTKA for CT chest today according to [facility] in file room. Called patient to follow up. She reports she was not aware of the appointment. Per Dr. Truett Perna : Keep OV on 8/12 and scan will be rescheduled.

## 2013-02-19 NOTE — Patient Instructions (Addendum)

## 2013-02-20 ENCOUNTER — Telehealth: Payer: Self-pay | Admitting: Oncology

## 2013-02-20 ENCOUNTER — Ambulatory Visit (HOSPITAL_BASED_OUTPATIENT_CLINIC_OR_DEPARTMENT_OTHER): Payer: Medicare Other | Admitting: Oncology

## 2013-02-20 VITALS — BP 140/64 | HR 70 | Temp 97.7°F | Resp 18 | Ht 63.0 in | Wt 206.6 lb

## 2013-02-20 DIAGNOSIS — C180 Malignant neoplasm of cecum: Secondary | ICD-10-CM

## 2013-02-20 DIAGNOSIS — E119 Type 2 diabetes mellitus without complications: Secondary | ICD-10-CM | POA: Diagnosis not present

## 2013-02-20 DIAGNOSIS — C182 Malignant neoplasm of ascending colon: Secondary | ICD-10-CM

## 2013-02-20 NOTE — Progress Notes (Signed)
   [facility]    OFFICE PROGRESS NOTE   INTERVAL HISTORY:   She returns as scheduled. She reports well. The neuropathy symptoms in the hands have improved significantly. She continues to have numbness in the feet. She somnolence with Neurontin, but this has helped tenderness over the thighs.  A colonoscopy on 12/08/2012. There were no polyps or evidence of recurrent neoplasm  Objective:  Vital signs in last 24 hours:  Blood pressure 140/64, pulse 70, temperature 97.7 F (36.5 C), temperature source Oral, resp. rate 18, height 5\' 3"  (1.6 m), weight 206 lb 9.6 oz (93.713 kg).    HEENT: neck without mass Lymphatics: no cervical, supra-clavicular, axillary, or inguinal nodes Resp: lungs clear bilaterally Cardio: regular rate and rhythm GI: no hepatomegaly, no mass Vascular: no leg edema Skin:improved hyperpigmentation at the hands   Portacath/PICC-without erythema  Lab Results:  [name] 11.9 on 02/19/2013  Medications: I have reviewed the patient's current medications.  Assessment/Plan: 1. 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 She began adjuvant CAPOX chemotherapy on 02/24/2012. She completed cycle 8 on 07/27/2012.Negative surveillance colonoscopy 12/08/2012 2. Delayed nausea following cycle 1 CAPOX. Aloxi was added beginning with cycle 2 with improvement. 3. Diabetes. 4. Microcytic anemia. Hemoglobin is normal. She will discontinue iron. 5. Remote history of a "colon tumor" removed endoscopically in 1980. 6. Left lower lung nodule noted on a CT 02/21/2012, slightly larger than on a CT 12/03/2011. Chest CT 06/12/2012 showed the previously seen 5 mm pulmonary nodule in left lower lobe was scarcely visualized, estimated at 3 mm. No other suspicious pulmonary nodules were identified. Chest CT 11/27/2012 showed interval enlargement of the left lower lobe pulmonary nodule with a current measurement of 7 mm x 8 mm. No new  pulmonary nodules. 7. Status post Port-A-Cath placement 02/22/2012. Status post Port-A-Cath repositioning 03/14/2012. 8. Probable acute laryngopharyngeal dysesthesia following cycle 2 CAPOX. Symptoms resolved with warming.  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 She reports improvement in the neuropathy symptoms today. 10. History of anterior chest pain-she reports undergoing an evaluation by Dr. Rennis Golden. 11. 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 12. [name] at the upper end of the normal range on 07/27/2012; 3.8 on 09/07/2012; 3.4 on 10/19/2012.Increased at 11.9 on 02/19/2013   Disposition:  She appears well. She will be scheduled for a restaging CT of the chest on 02/22/2013. Ms. Bolda returns for an office visit on 02/23/2013.   Thornton Papas, MD  02/20/2013  7:18 PM

## 2013-02-20 NOTE — Telephone Encounter (Signed)
gv and printed appt sched and avs for pt  °

## 2013-02-22 ENCOUNTER — Ambulatory Visit (HOSPITAL_COMMUNITY)
Admission: RE | Admit: 2013-02-22 | Discharge: 2013-02-22 | Disposition: A | Discharge status: Home / Self Care | Payer: Medicare Other | Source: Ambulatory Visit | Attending: Nurse Practitioner | Admitting: Nurse Practitioner

## 2013-02-22 DIAGNOSIS — R911 Solitary pulmonary nodule: Secondary | ICD-10-CM | POA: Diagnosis not present

## 2013-02-22 DIAGNOSIS — C189 Malignant neoplasm of colon, unspecified: Secondary | ICD-10-CM | POA: Diagnosis not present

## 2013-02-22 DIAGNOSIS — E049 Nontoxic goiter, unspecified: Secondary | ICD-10-CM | POA: Diagnosis not present

## 2013-02-22 DIAGNOSIS — C182 Malignant neoplasm of ascending colon: Secondary | ICD-10-CM | POA: Diagnosis not present

## 2013-02-23 ENCOUNTER — Telehealth: Payer: Self-pay | Admitting: Oncology

## 2013-02-23 ENCOUNTER — Ambulatory Visit (HOSPITAL_BASED_OUTPATIENT_CLINIC_OR_DEPARTMENT_OTHER): Payer: Medicare Other | Admitting: Oncology

## 2013-02-23 VITALS — BP 154/69 | HR 82 | Temp 97.4°F | Resp 18 | Ht 63.0 in | Wt 206.3 lb

## 2013-02-23 DIAGNOSIS — C182 Malignant neoplasm of ascending colon: Secondary | ICD-10-CM | POA: Diagnosis not present

## 2013-02-23 NOTE — Progress Notes (Signed)
   [facility]    OFFICE PROGRESS NOTE   INTERVAL HISTORY:   Ms. Beske returns as scheduled. She underwent a restaging CT of the chest on 02/22/2013. Compared to 11/27/2012 the anterior left lower lobe nodule has increased in size, now measuring 11 x 12 mm. No other suspicious pulmonary nodules or masses are identified. No lymphadenopathy or effusion.  Objective:  Vital signs in last 24 hours:  Blood pressure 154/69, pulse 82, temperature 97.4 F (36.3 C), temperature source Oral, resp. rate 18, height 5\' 3"  (1.6 m), weight 206 lb 4.8 oz (93.577 kg).   Physical exam-not performed today  Lab Results:  [name] on 02/19/2013-11.9   Medications: I have reviewed the patient's current medications.  Assessment/Plan: 1. 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 She began adjuvant CAPOX chemotherapy on 02/24/2012. She completed cycle 8 on 07/27/2012.Negative surveillance colonoscopy 12/08/2012 2. Delayed nausea following cycle 1 CAPOX. Aloxi was added beginning with cycle 2 with improvement. 3. Diabetes. 4. Microcytic anemia. Hemoglobin is normal. She will discontinue iron. 5. Remote history of a "colon tumor" removed endoscopically in 1980. 6. Left lower lung nodule noted on a CT 02/21/2012, slightly larger than on a CT 12/03/2011. Chest CT 06/12/2012 showed the previously seen 5 mm pulmonary nodule in left lower lobe was scarcely visualized, estimated at 3 mm. No other suspicious pulmonary nodules were identified. Chest CT 11/27/2012 showed interval enlargement of the left lower lobe pulmonary nodule with a current measurement of 7 mm x 8 mm. No new pulmonary nodules. CT 02/22/2013 with enlargement of the anterior left lower lobe nodule and no other lung nodules. 7. Status post Port-A-Cath placement 02/22/2012. Status post Port-A-Cath repositioning 03/14/2012. 8. Probable acute laryngopharyngeal dysesthesia following cycle 2  CAPOX. Symptoms resolved with warming.  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 She reports improvement in the neuropathy symptoms today. 10. History of anterior chest pain-she reports undergoing an evaluation by Dr. Rennis Golden. 11. 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 12. [name] at the upper end of the normal range on 07/27/2012; 3.8 on 09/07/2012; 3.4 on 10/19/2012.Increased at 11.9 on 02/19/2013   Disposition:  The left lower lobe nodule has increased in size and the [name] is elevated. The nodule could represent a benign finding, metastatic colon cancer, or a primary lung tumor. I reviewed the CT images with Ms. Monger. We discussed the differential diagnosis. The plan is to proceed with a staging PET scan to evaluate the lung nodule and look for additional evidence of metastatic disease. I will present her case at the GI tumor conference on 03/07/2013 and she will return for an office visit on 03/08/2013   Thornton Papas, MD  02/23/2013  12:33 PM

## 2013-02-23 NOTE — Telephone Encounter (Signed)
Gave pt appt for lMl visit after PET Scan, gave order to [facility] for precert

## 2013-03-02 ENCOUNTER — Encounter (HOSPITAL_COMMUNITY)
Admission: RE | Admit: 2013-03-02 | Discharge: 2013-03-02 | Disposition: A | Discharge status: Home / Self Care | Payer: Medicare Other | Source: Ambulatory Visit | Attending: Oncology | Admitting: Oncology

## 2013-03-02 ENCOUNTER — Other Ambulatory Visit: Payer: Self-pay | Admitting: Oncology

## 2013-03-02 ENCOUNTER — Encounter (HOSPITAL_COMMUNITY): Payer: Self-pay

## 2013-03-02 DIAGNOSIS — C182 Malignant neoplasm of ascending colon: Secondary | ICD-10-CM | POA: Diagnosis not present

## 2013-03-02 DIAGNOSIS — R911 Solitary pulmonary nodule: Secondary | ICD-10-CM | POA: Diagnosis not present

## 2013-03-02 MED ORDER — FLUDEOXYGLUCOSE F - 18 (FDG) INJECTION
17.2000 | Freq: Once | INTRAVENOUS | Status: AC | PRN
  Administered 2013-03-02: 17.2 via INTRAVENOUS

## 2013-03-05 LAB — GLUCOSE, CAPILLARY: Glucose-Capillary: 108 mg/dL — ABNORMAL HIGH (ref 70–99)

## 2013-03-07 ENCOUNTER — Telehealth: Payer: Self-pay | Admitting: *Deleted

## 2013-03-07 NOTE — Telephone Encounter (Signed)
Spoke with pt regarding appt for MTOC 03/15/13 at 3:00 arrive at 2:45.  She verbalized understanding of time and place of appt

## 2013-03-08 ENCOUNTER — Telehealth: Payer: Self-pay

## 2013-03-08 ENCOUNTER — Ambulatory Visit (HOSPITAL_BASED_OUTPATIENT_CLINIC_OR_DEPARTMENT_OTHER): Payer: Medicare Other | Admitting: Nurse Practitioner

## 2013-03-08 VITALS — BP 132/77 | HR 69 | Temp 97.0°F | Resp 18 | Ht 63.0 in | Wt 205.6 lb

## 2013-03-08 DIAGNOSIS — C182 Malignant neoplasm of ascending colon: Secondary | ICD-10-CM | POA: Diagnosis not present

## 2013-03-08 NOTE — Telephone Encounter (Signed)
gv and printed appt sched and avs for pt aug and OCT .Marland KitchenMarland KitchenMarland KitchenPt sched with Dr Pollyann Kennedy for 9.2.14 @ 2:00pm

## 2013-03-08 NOTE — Progress Notes (Addendum)
OFFICE PROGRESS NOTE  Interval history:  Peggy French returns as scheduled. PET scan on 03/02/2013 showed no hypermetabolism associated with the 12 mm left lower lobe pulmonary nodule. There was no abnormal hypermetabolic activity within the liver, pancreas, adrenal glands or spleen. No hypermetabolic lymph nodes in the abdomen, pelvis, chest or neck. Hypermetabolic uptake was noted in the posterior left nasopharyngeal mucosa.  She overall feels well. No shortness of breath. She has some "sinus drainage" and periodic headaches. Overall good appetite. No pain. Bowels moving regularly.   Objective: Blood pressure 132/77, pulse 69, temperature 97 F (36.1 C), temperature source Oral, resp. rate 18, height 5\' 3"  (1.6 m), weight 205 lb 9.6 oz (93.26 kg).  No palpable cervical, supraclavicular or axillary lymph nodes. Lungs are clear. Regular cardiac rhythm. Port-A-Cath site without erythema. Abdomen soft. No hepatomegaly. Extremities without edema.  Lab Results: Lab Results  Component Value Date   WBC 3.8* 08/28/2012   HGB 12.5 08/28/2012   HCT 36.7 08/28/2012   MCV 90.2 08/28/2012   PLT 117* 08/28/2012    Chemistry:    Chemistry      Component Value Date/Time   NA 138 11/27/2012 0807   NA 139 02/29/2012 1051   K 3.7 11/27/2012 0807   K 3.7 02/29/2012 1051   CL 101 11/27/2012 0807   CL 97 02/24/2012 1120   CO2 25 11/27/2012 0807   CO2 28 02/24/2012 1120   BUN 14.8 11/27/2012 0807   BUN 7 02/24/2012 1120   CREATININE 0.9 11/27/2012 0807   CREATININE 0.65 02/24/2012 1120   CREATININE 0.91 12/02/2011 1159      Component Value Date/Time   CALCIUM 9.4 11/27/2012 0807   CALCIUM 9.1 02/24/2012 1120   ALKPHOS 117 11/27/2012 0807   ALKPHOS 88 02/24/2012 1120   AST 29 11/27/2012 0807   AST 73* 02/24/2012 1120   ALT 15 11/27/2012 0807   ALT 13 02/24/2012 1120   BILITOT 0.40 11/27/2012 0807   BILITOT 0.3 02/24/2012 1120       Studies/Results: Ct Chest Wo Contrast  02/22/2013   *RADIOLOGY REPORT*   Clinical Data: Follow up indeterminate left lung nodule.  Colon carcinoma.  CT CHEST WITHOUT CONTRAST  Technique:  Multidetector CT imaging of the chest was performed following the standard protocol without IV contrast.  Comparison: 11/27/2012  Findings: 11 x 12 mm pulmonary nodule in the anterior left lower lobe on image 39 is increased in size since previous study when measured 7 x 9 mm.  This has the lobulated margins, and may represent a primary bronchogenic carcinoma rather than metastasis. No other suspicious pulmonary nodules or masses are identified.  No evidence of pulmonary infiltrate or central endobronchial lesion.  No evidence of pleural or pericardial effusion.  No evidence of hilar mediastinal masses.  No adenopathy seen elsewhere within the thorax.  Mild goiter again noted.  No suspicious bone lesions are identified.  IMPRESSION:  1.  Increased size of 12 mm lobulated nodule in the anterior left lower lobe.  This is suspicious for primary bronchogenic carcinoma, although a solitary metastasis cannot definitely be excluded. Consider PET CT and/or biopsy for further evaluation. 2.  No evidence of lymphadenopathy or pleural effusion. 3.  Stable mild goiter.   Original Report Authenticated By: Myles Rosenthal, M.D.   Nm Pet Image Restag (ps) Skull Base To Thigh  03/02/2013   *RADIOLOGY REPORT*  Clinical Data:  Initial treatment strategy for lung nodule.  The patient also with history of colon cancer.  NUCLEAR MEDICINE PET WHOLE BODY  Fasting Blood Glucose:  108  Technique:  17.2 mCi F-18 FDG was injected intravenously. CT data was obtained and used for attenuation correction and anatomic localization only.  (This was not acquired as a diagnostic CT examination.) Additional exam technical data entered on technologist worksheet.  Comparison:  Chest CT from 02/22/2013, 06/12/2012, and 12/03/2011  Findings:  Head/Neck:  No hypermetabolic lymph nodes are identified in the neck.  The patient is noted to have  hypermetabolic uptake in the region of the left fossa of Rosenmuller.  Chest:  No hypermetabolic lymphadenopathy in the chest.  12 mm left lower lobe pulmonary nodule shows no associated hypermetabolism.  Abdomen/Pelvis:  No abnormal hypermetabolic activity within the liver, pancreas, adrenal glands, or spleen.  No hypermetabolic lymph nodes in the abdomen or pelvis.  Skeleton:  No focal hypermetabolic activity to suggest skeletal metastasis.  Extremities:  No hypermetabolic activity to suggest metastasis.  IMPRESSION: The 12 mm left lower lobe pulmonary nodule shows no evidence for hypermetabolism on today's PET scan although when comparing back to 02/22/2013 and 06/12/2012, this has certainly progressed substantially in size since the 2013 exam. Well differentiated and low grade neoplasm can be poorly FDG avid and primary lung neoplasm is not entirely excluded.  Metastatic disease is also a consideration, but is considered less likely.  Abnormal F D G accumulation in the posterior left nasopharyngeal mucosa, in the region of the left fossa of Rosenmuller. 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  MRI without and with contrast or direct visualization recommended to further evaluate.   Original Report Authenticated By: Kennith Center, M.D.    Medications: I have reviewed the patient's current medications.  Assessment/Plan:  1. 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 She began adjuvant CAPOX chemotherapy on 02/24/2012. She completed cycle 8 on 07/27/2012.Negative surveillance colonoscopy 12/08/2012 2. Delayed nausea following cycle 1 CAPOX. Aloxi was added beginning with cycle 2 with improvement. 3. Diabetes. 4. Microcytic anemia. Hemoglobin is normal. She will discontinue iron. 5. Remote history of a "colon tumor" removed endoscopically in 1980. 6. Left lower lung nodule noted on a CT 02/21/2012, slightly larger than on a CT 12/03/2011.  Chest CT 06/12/2012 showed the previously seen 5 mm pulmonary nodule in left lower lobe was scarcely visualized, estimated at 3 mm. No other suspicious pulmonary nodules were identified. Chest CT 11/27/2012 showed interval enlargement of the left lower lobe pulmonary nodule with a current measurement of 7 mm x 8 mm. No new pulmonary nodules. CT 02/22/2013 with enlargement of the anterior left lower lobe nodule and no other lung nodules. PET scan 03/02/2013 showed no associated hypermetabolism with the 12 mm left lower lobe pulmonary nodule. 7. Status post Port-A-Cath placement 02/22/2012. Status post Port-A-Cath repositioning 03/14/2012. 8. Probable acute laryngopharyngeal dysesthesia following cycle 2 CAPOX. Symptoms resolved with warming.  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 She reports improvement in the neuropathy symptoms today. 10. History of anterior chest pain-she reports undergoing an evaluation by Dr. Rennis Golden. 11. 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 12. [name] at the upper end of the normal range on 07/27/2012; 3.8 on 09/07/2012; 3.4 on 10/19/2012. Increased at 11.9 on 02/19/2013. 13. PET scan 03/02/2013 showed no associated hypermetabolism with the 12 mm left lower lobe pulmonary nodule. No hypermetabolic lymph nodes in the neck, chest, abdomen or pelvis. No abnormal hypermetabolic activity within the liver, pancreas, adrenal glands or spleen. Abnormal FDG accumulation in  the posterior left nasopharyngeal mucosa.  Disposition-Ms. Clouatre appears stable. Dr. Truett Perna reviewed the PET scan findings with her and her husband. She is scheduled to be seen at the thoracic clinic next week. We also made a referral to Dr. Pollyann Kennedy, ENT for evaluation of the hypermetabolic activity at the posterior left nasopharyngeal mucosa.  Dr. Truett Perna will see her back in approximately 6 weeks.   Lonna Cobb ANP/GNP-BC   This was a shared visit with  Lonna Cobb.  I reviewed the PET scan findings and treatment options with Ms. Guettler. She understands the left lower lobe nodule has increased in size and is likely malignant despite the lack of hypermetabolic activity on the PET scan. Her case was presented at the GI tumor conference on 03/07/2013. The consensus recommendation was to proceed with resection of the lung nodule. She has been referred to the multidisciplinary thoracic clinic and has an appointment on 03/15/2013.  Ms. Weatherholtz understands it is possible the lung lesion represents a primary lung cancer, benign process, or metastatic colon cancer. She also understands resection of the lung nodule may not be curative. She agrees to proceed with the surgical consultation.  If she does not have surgery she will be considered for stereotactic radiosurgery.   Mancel Bale, M.D.

## 2013-03-13 DIAGNOSIS — Z85038 Personal history of other malignant neoplasm of large intestine: Secondary | ICD-10-CM | POA: Diagnosis not present

## 2013-03-15 ENCOUNTER — Ambulatory Visit: Payer: Medicare Other | Admitting: Physical Therapy

## 2013-03-15 ENCOUNTER — Encounter: Payer: Self-pay | Admitting: Cardiothoracic Surgery

## 2013-03-15 ENCOUNTER — Institutional Professional Consult (permissible substitution) (INDEPENDENT_AMBULATORY_CARE_PROVIDER_SITE_OTHER): Payer: Medicare Other | Admitting: Cardiothoracic Surgery

## 2013-03-15 VITALS — BP 130/71 | HR 70 | Resp 20 | Ht 63.0 in | Wt 205.0 lb

## 2013-03-15 DIAGNOSIS — R911 Solitary pulmonary nodule: Secondary | ICD-10-CM | POA: Diagnosis not present

## 2013-03-15 NOTE — H&P (Signed)
[address]       [place] [address]             [phone]                    Peggy French [facility] Medical Record #[id number] Date of Birth: [birth year]  Referring: [name], MD Primary Care: [name], MD  Chief Complaint:    Chief Complaint  Patient presents with  . Lung Lesion    Surgical eval on left lower lobe lung nodule, Chest CT 02/22/13, PET Scan 03/02/13    History of Present Illness:    Patient is a 72 year old female with a history of colon cancer more than 30 years ago, and then a more recent colon resection by Dr. Daphine Deutscher. June 2013. At that time apT3N2a  With Kras mutation  Was resected. She began adjuvant CAPOX chemotherapy on 02/24/2012. She completed cycle 8 on 07/27/2012.Negative surveillance colonoscopy 12/08/2012 .  Followup CT scan has showed progressively enlarging and 1.2 cm lesion in the left long along the cardiac border, suspicious for malignancy.   Current Activity/ Functional Status:  Patient is independent with mobility/ambulation, transfers, ADL's, IADL's.  Zubrod Score: At the time of surgery this patient's most appropriate activity status/level should be described as: []  Normal activity, no symptoms [x]  Symptoms, fully ambulatory []  Symptoms, in bed less than or equal to 50% of the time []  Symptoms, in bed greater than 50% of the time but less than 100% []  Bedridden []  Moribund   Past Medical History  Diagnosis Date  . Gout   . Diabetes mellitus     x over 10 yrs  . Hyperlipidemia   . GERD (gastroesophageal reflux disease)   . Chest pain   . H/O hiatal hernia   . Headache(784.0)     hx migraines  . Arthritis   . Sleep apnea     does not have CPAP machine  sleep study done on 05/19/2011  AHI during total sleep time (3h 53 min) 13.1/hr and REM sleep at 46.7/hr  . Cancer     LOV Dr Truett Perna 02/14/12 EPIC  . Asthmatic bronchitis 2011  . Hypertension     EKG 10/12 EPIC, chest- 1 view  6/13  EPIC    Last 2D Echo on 05/02/2012 showed EF of greater than 55%  . Coronary artery disease -cardiac cath Dr Rennis Golden 2012 - no significant stenosis   . Myocardial infarction     2001    Past Surgical History  Procedure Laterality Date  . Cholecystectomy    . Abdominal hysterectomy    . Colon surgery      Tumor  . Esophagogastroduodenoscopy  06/16/2011    Procedure: ESOPHAGOGASTRODUODENOSCOPY (EGD);  Surgeon: Malissa Hippo, MD;  Location: [facility];  Service: Endoscopy;  Laterality: N/A;  2:15  . Colonoscopy  11/26/2011    Procedure: COLONOSCOPY;  Surgeon: Malissa Hippo, MD;  Location: [facility];  Service: Endoscopy;  Laterality: N/A;  2:15  . Appendectomy    . Cardiac catheterization  2001,2012  . Portacath placement  02/22/2012    Procedure: INSERTION PORT-A-CATH;  Surgeon: Valarie Merino, MD;  Location: [facility];  Service: General;  Laterality: N/A;  left subclavian  . Colonoscopy N/A 12/08/2012    Procedure: COLONOSCOPY;  Surgeon: Malissa Hippo, MD;  Location: [facility];  Service: Endoscopy;  Laterality: N/A;  815-moved to 0950 Ann to notify pt  Family History  Problem Relation Age of Onset  . Cancer Maternal Aunt     throat,     History   Social History  . Marital Status: Married    Spouse Name: N/A    Number of Children: 4  . Years of Education: N/A   Occupational History  .     Social History Main Topics  . Smoking status: Never Smoker   . Smokeless tobacco: Never Used  . Alcohol Use: No  . Drug Use: No  . Sexual Activity: Not on file   Other Topics Concern  . Not on file   Social History Narrative   Married to husband, Molly Maduro   Retired Scientist, product/process development    History  Smoking status  . Never Smoker   Smokeless tobacco  . Never Used    History  Alcohol Use No     Allergies  Allergen Reactions  . Aspirin Palpitations    Current Outpatient Prescriptions  Medication Sig Dispense Refill  . acetaminophen (TYLENOL) 500 MG tablet Take  1,000 mg by mouth every 6 (six) hours as needed. For pain      . amLODipine (NORVASC) 5 MG tablet Take 5 mg by mouth daily after breakfast.       . esomeprazole (NEXIUM) 40 MG capsule Take 40 mg by mouth every evening.      . fexofenadine (ALLEGRA) 180 MG tablet Take 180 mg by mouth daily as needed. For Allergies      . gabapentin (NEURONTIN) 100 MG capsule Take 300 mg by mouth 4 (four) times daily.       Marland Kitchen lidocaine-prilocaine (EMLA) cream Apply topically as needed. Apply to port-a-cath 1-2 hours prior to chemo.  Cover with plastic wrap  30 g  1  . linagliptin (TRADJENTA) 5 MG TABS tablet Take 5 mg by mouth daily after breakfast.       . oxyCODONE-acetaminophen (PERCOCET/ROXICET) 5-325 MG per tablet Take 1 tablet by mouth every 4 (four) hours as needed.      . propranolol-hydrochlorothiazide (INDERIDE) 40-25 MG per tablet Take 1 tablet by mouth daily with breakfast.       . ramipril (ALTACE) 5 MG capsule Take 5 mg by mouth daily with breakfast.       . simvastatin (ZOCOR) 40 MG tablet Take 40 mg by mouth at bedtime.       . traMADol (ULTRAM) 50 MG tablet Take 50 mg by mouth every 8 (eight) hours as needed for pain.       No current facility-administered medications for this visit.   Facility-Administered Medications Ordered in Other Visits  Medication Dose Route Frequency Provider Last Rate Last Dose  . sodium chloride 0.9 % injection 10 mL  10 mL Intracatheter PRN [name], MD           Review of Systems:     Cardiac Review of Systems: Y or N  Chest Pain [  n  ]  Resting SOB [  n ] Exertional SOB  [ mild ]  Orthopnea [ n ]   Pedal Edema [ n  ]    Palpitations [n  ] Syncope  [ n ]   Presyncope [n   ]  General Review of Systems: [Y] = yes [  ]=no Constitional: recent weight change [ n ]; anorexia [  ]; fatigue [place]ienne  ]; nausea [  ]; night sweats [ n ]; fever [ n ]; or chills [  n];  Dental: poor dentition[ dentures ]; Last Dentist visit:   Eye : blurred vision [ n ]; diplopia [   ]; vision changes [  ];  Amaurosis fugax[  ]; Resp: cough [  n];  wheezing[n  ];  hemoptysis[ n ]; shortness of breath[n  ]; paroxysmal nocturnal dyspnea[n  ]; dyspnea on exertion[ y ]; or orthopnea[  ];  GI:  gallstones[  ], vomiting[  ];  dysphagia[  ]; melena[  ];  hematochezia [  ]; heartburn[  ];   Hx of  Colonoscopy[  ]; GU: kidney stones [  ]; hematuria[  ];   dysuria [  ];  nocturia[  ];  history of     obstruction [  ]; urinary frequency [  ]             Skin: rash, swelling[  ];, hair loss[  ];  peripheral edema[  ];  or itching[  ]; Musculosketetal: myalgias[  ];  joint swelling[  ];  joint erythema[  ];  joint pain[  ];  back pain[  ];  Heme/Lymph: bruising[  ];  bleeding[ n ];  anemia[  ];  Neuro: TIA[  ];  headaches[  ];  stroke[  ];  vertigo[  ];  seizures[n  ];   paresthesias[n  ];  difficulty walking[ n ];  Psych:depression[  ]; anxiety[  ];  Endocrine: diabetes[12 years  ];  thyroid dysfunction[  ];  Immunizations: Flu [ ? ]; Pneumococcal[ ? ];  Other:  Physical Exam: BP 130/71  Pulse 70  Resp 20  Ht 5\' 3"  (1.6 m)  Wt 205 lb (92.987 kg)  BMI 36.32 kg/m2  SpO2 95%  General appearance: alert and cooperative Neurologic: intact Heart: regular rate and rhythm, S1, S2 normal, no murmur, click, rub or gallop and normal apical impulse Lungs: clear to auscultation bilaterally and normal percussion bilaterally Abdomen: soft, non-tender; bowel sounds normal; no masses,  no organomegaly Extremities: extremities normal, atraumatic, no cyanosis or edema and Homans sign is negative, no sign of DVT Wound: rt mid abdominal incision well healed    Diagnostic Studies & Laboratory data:     Recent Radiology Findings:   Ct Chest Wo Contrast  02/22/2013   *RADIOLOGY REPORT*  Clinical Data: Follow up indeterminate left lung nodule.  Colon carcinoma.  CT  CHEST WITHOUT CONTRAST  Technique:  Multidetector CT imaging of the chest was performed following the standard protocol without IV contrast.  Comparison: 11/27/2012  Findings: 11 x 12 mm pulmonary nodule in the anterior left lower lobe on image 39 is increased in size since previous study when measured 7 x 9 mm.  This has the lobulated margins, and may represent a primary bronchogenic carcinoma rather than metastasis. No other suspicious pulmonary nodules or masses are identified.  No evidence of pulmonary infiltrate or central endobronchial lesion.  No evidence of pleural or pericardial effusion.  No evidence of hilar mediastinal masses.  No adenopathy seen elsewhere within the thorax.  Mild goiter again noted.  No suspicious bone lesions are identified.  IMPRESSION:  1.  Increased size of 12 mm lobulated nodule in the anterior left lower lobe.  This is suspicious for primary bronchogenic carcinoma, although a solitary metastasis cannot definitely be excluded. Consider PET CT and/or biopsy for further evaluation. 2.  No evidence of lymphadenopathy or pleural effusion. 3.  Stable mild goiter.   Original Report Authenticated By: Myles Rosenthal, M.D.   Nm Pet Image Restag (ps) Skull Base To  Thigh  03/02/2013   *RADIOLOGY REPORT*  Clinical Data:  Initial treatment strategy for lung nodule.  The patient also with history of colon cancer.  NUCLEAR MEDICINE PET WHOLE BODY  Fasting Blood Glucose:  108  Technique:  17.2 mCi F-18 FDG was injected intravenously. CT data was obtained and used for attenuation correction and anatomic localization only.  (This was not acquired as a diagnostic CT examination.) Additional exam technical data entered on technologist worksheet.  Comparison:  Chest CT from 02/22/2013, 06/12/2012, and 12/03/2011  Findings:  Head/Neck:  No hypermetabolic lymph nodes are identified in the neck.  The patient is noted to have hypermetabolic uptake in the region of the left fossa of Rosenmuller.  Chest:  No  hypermetabolic lymphadenopathy in the chest.  12 mm left lower lobe pulmonary nodule shows no associated hypermetabolism.  Abdomen/Pelvis:  No abnormal hypermetabolic activity within the liver, pancreas, adrenal glands, or spleen.  No hypermetabolic lymph nodes in the abdomen or pelvis.  Skeleton:  No focal hypermetabolic activity to suggest skeletal metastasis.  Extremities:  No hypermetabolic activity to suggest metastasis.  IMPRESSION: The 12 mm left lower lobe pulmonary nodule shows no evidence for hypermetabolism on today's PET scan although when comparing back to 02/22/2013 and 06/12/2012, this has certainly progressed substantially in size since the 2013 exam. Well differentiated and low grade neoplasm can be poorly FDG avid and primary lung neoplasm is not entirely excluded.  Metastatic disease is also a consideration, but is considered less likely.  Abnormal F D G accumulation in the posterior left nasopharyngeal mucosa, in the region of the left fossa of Rosenmuller. 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  MRI without and with contrast or direct visualization recommended to further evaluate.   Original Report Authenticated By: Kennith Center, M.D.   On review I believe that there is misregistration and infact the left lung nodule is hypermetabolic   Recent Lab Findings: Lab Results  Component Value Date   WBC 3.8* 08/28/2012   HGB 12.5 08/28/2012   HCT 36.7 08/28/2012   PLT 117* 08/28/2012   GLUCOSE 128* 11/27/2012   CHOL 163 04/02/2011   TRIG 57 04/02/2011   HDL 60 04/02/2011   LDLCALC 92 04/02/2011   ALT 15 11/27/2012   AST 29 11/27/2012   NA 138 11/27/2012   K 3.7 11/27/2012   CL 101 11/27/2012   CREATININE 0.9 11/27/2012   BUN 14.8 11/27/2012   CO2 25 11/27/2012   TSH 1.908 04/01/2011   INR 1.01 04/02/2011   HGBA1C 6.5* 04/01/2011   Cardiac cath done 2012: Dr Rennis Golden 04/02/2011  DATE OF DISCHARGE:  CARDIAC CATHETERIZATION  OPERATOR: [place] Hilty, MD  INDICATIONS: Chest  pain, nausea and vomiting concerning for unstable  angina.  HISTORY OF PRESENT ILLNESS: Peggy French is a 72 year old female  with a history of diabetes type 2, hypertension, gout and history of  cholecystectomy, status post partial colectomy approximately 20 years  ago who presents now with nausea, vomiting, chest pain and had a cardiac  catheterization last in 2001 which showed normal coronaries with some  RCA spasm. Yesterday presented with chest pain and did rule out for MI  by troponins and is referred for cardiac catheterization.  PROCEDURE IN DETAILS: After informed consent was obtained, the patient  was brought to cardiac catheterization lab, sterilely prepped and draped  in usual fashion after procedural radiation safety time-out. The area  around the right femoral artery was identified, however, pulse was noted  to be  very weak. There was neither a good pulse on the left groin  either. That area was then prepped and draped. Fluoroscopy was used to  identify the location of the suspected femoral artery and attempts were  made to access that. Then attempts were made using a "smart" needle  which allowed direct access to the right femoral artery. Once access  was obtained, the sheath was placed without difficulty and subsequent  catheterization was performed with a JL-4 pigtail catheters and number  of right coronary catheters. Ultimately an AR-1 was used to engage the  right coronary artery which took somewhat of an inferior takeoff. A LV  gram was performed. Estimated blood loss was less than 10 mL. There  were no acute complications. The patient received 1 mg of Versed, 25  mcg of fentanyl for moderate sedation and 10 mL of 1% local lidocaine.  FINDINGS:  1. Left main - no disease.  2. LAD - there is a 20-30% ostial bifurcation stenosis, however,  distally there are only mild luminal irregularities.  3. Left circumflex. There was no significant disease. There were 2    large OM branches also free of disease.  4. Right coronary artery. No spasm was noted. With the catheter was  engaged with an AR-1 as mentioned. There is a dominant vessel with  only mild luminal irregularities.  5. LVEDP = 9 mmHg.  6. LV gram - EF greater than 60%, no wall motion abnormalities.  IMPRESSION:  1. No significant coronary artery disease.  2. Left ventricular ejection fraction greater than 60%.  PLAN: Peggy French continues to have some nausea, vomiting and chest  pressure. I wonder this could be secondary to gastroparesis or possibly  small gastroparesis. I doubt she has a small bowel obstruction as she  has been having a normal bowel movement. She is followed by Dr. Karilyn Cota for  GI and we may consider a need for further workup, perhaps of gastric  emptying study. This may represent some gastroparesis given her  longstanding diabetes. I would also increase her home omeprazole dose to  20 mg by mouth twice daily.  [place] Hilty, MD    Assessment / Plan:     Patient with known carcinoma of the colon now presents with a solitary enlarging left lung lesion. I recommended to the patient proceeding with bronchoscopy left video-assisted thoracoscopy and wedge resection of the lesion, both for treatment and for diagnosis. The risks and options of observation versus proceeding with resection were discussed in detail. The patient is willing to proceed and tentatively we have arranged for surgery on Tuesday, September 16. She will obtain pulmonary functions prior to that time.   Delight Ovens MD      [address] [place] [place] [address] Office [phone]   Beeper [phone]  03/15/2013 5:08 PM

## 2013-03-16 ENCOUNTER — Other Ambulatory Visit: Payer: Self-pay | Admitting: *Deleted

## 2013-03-16 DIAGNOSIS — R918 Other nonspecific abnormal finding of lung field: Secondary | ICD-10-CM

## 2013-03-22 ENCOUNTER — Encounter (HOSPITAL_COMMUNITY): Payer: Self-pay | Admitting: Respiratory Therapy

## 2013-03-22 MED ORDER — NALOXONE HCL 0.4 MG/ML IJ SOLN
INTRAMUSCULAR | Status: AC
Start: 2013-03-22 — End: 2013-03-22
  Filled 2013-03-22: qty 1

## 2013-03-23 ENCOUNTER — Ambulatory Visit (HOSPITAL_COMMUNITY)
Admission: RE | Admit: 2013-03-23 | Discharge: 2013-03-23 | Disposition: A | Discharge status: Home / Self Care | Payer: Medicare Other | Source: Ambulatory Visit | Attending: Cardiothoracic Surgery | Admitting: Cardiothoracic Surgery

## 2013-03-23 ENCOUNTER — Encounter (HOSPITAL_COMMUNITY): Payer: Self-pay

## 2013-03-23 ENCOUNTER — Encounter (HOSPITAL_COMMUNITY)
Admission: RE | Admit: 2013-03-23 | Discharge: 2013-03-23 | Disposition: A | Discharge status: Home / Self Care | Payer: Medicare Other | Source: Ambulatory Visit | Attending: Cardiothoracic Surgery | Admitting: Cardiothoracic Surgery

## 2013-03-23 VITALS — BP 107/71 | HR 64 | Temp 98.1°F | Resp 18 | Ht 63.0 in | Wt 204.2 lb

## 2013-03-23 DIAGNOSIS — R911 Solitary pulmonary nodule: Secondary | ICD-10-CM | POA: Insufficient documentation

## 2013-03-23 DIAGNOSIS — Z01818 Encounter for other preprocedural examination: Secondary | ICD-10-CM | POA: Diagnosis not present

## 2013-03-23 DIAGNOSIS — R918 Other nonspecific abnormal finding of lung field: Secondary | ICD-10-CM

## 2013-03-23 DIAGNOSIS — Z01810 Encounter for preprocedural cardiovascular examination: Secondary | ICD-10-CM | POA: Insufficient documentation

## 2013-03-23 DIAGNOSIS — Z01812 Encounter for preprocedural laboratory examination: Secondary | ICD-10-CM | POA: Insufficient documentation

## 2013-03-23 LAB — URINE MICROSCOPIC-ADD ON

## 2013-03-23 LAB — CBC
HCT: 39.7 % (ref 36.0–46.0)
Hemoglobin: 13.8 g/dL (ref 12.0–15.0)
MCH: 27.4 pg (ref 26.0–34.0)
MCHC: 34.8 g/dL (ref 30.0–36.0)
MCV: 78.9 fL (ref 78.0–100.0)
Platelets: 178 10*3/uL (ref 150–400)
RBC: 5.03 MIL/uL (ref 3.87–5.11)
RDW: 13.7 % (ref 11.5–15.5)
WBC: 5.4 10*3/uL (ref 4.0–10.5)

## 2013-03-23 LAB — COMPREHENSIVE METABOLIC PANEL
ALT: 13 U/L (ref 0–35)
AST: 24 U/L (ref 0–37)
Albumin: 3.4 g/dL — ABNORMAL LOW (ref 3.5–5.2)
Alkaline Phosphatase: 128 U/L — ABNORMAL HIGH (ref 39–117)
BUN: 14 mg/dL (ref 6–23)
CO2: 22 mEq/L (ref 19–32)
Calcium: 9.2 mg/dL (ref 8.4–10.5)
Chloride: 99 mEq/L (ref 96–112)
Creatinine, Ser: 0.83 mg/dL (ref 0.50–1.10)
GFR calc Af Amer: 81 mL/min — ABNORMAL LOW (ref >90)
GFR calc non Af Amer: 70 mL/min — ABNORMAL LOW (ref >90)
Glucose, Bld: 113 mg/dL — ABNORMAL HIGH (ref 70–99)
Potassium: 3.5 mEq/L (ref 3.5–5.1)
Sodium: 135 mEq/L (ref 135–145)
Total Bilirubin: 0.3 mg/dL (ref 0.3–1.2)
Total Protein: 7.9 g/dL (ref 6.0–8.3)

## 2013-03-23 LAB — PULMONARY FUNCTION TEST

## 2013-03-23 LAB — URINALYSIS, ROUTINE W REFLEX MICROSCOPIC
Bilirubin Urine: NEGATIVE
Glucose, UA: NEGATIVE mg/dL
Hgb urine dipstick: NEGATIVE
Ketones, ur: NEGATIVE mg/dL
Nitrite: NEGATIVE
Protein, ur: NEGATIVE mg/dL
Specific Gravity, Urine: 1.017 (ref 1.005–1.030)
Urobilinogen, UA: 1 mg/dL (ref 0.0–1.0)
pH: 6 (ref 5.0–8.0)

## 2013-03-23 LAB — BLOOD GAS, ARTERIAL
Acid-Base Excess: 3.6 mmol/L — ABNORMAL HIGH (ref 0.0–2.0)
Bicarbonate: 27.5 mEq/L — ABNORMAL HIGH (ref 20.0–24.0)
Drawn by: 206361
FIO2: 0.21 %
O2 Saturation: 97.5 %
Patient temperature: 98.6
TCO2: 28.7 mmol/L (ref 0–100)
pCO2 arterial: 40.4 mmHg (ref 35.0–45.0)
pH, Arterial: 7.447 (ref 7.350–7.450)
pO2, Arterial: 95.6 mmHg (ref 80.0–100.0)

## 2013-03-23 LAB — SURGICAL PCR SCREEN
MRSA, PCR: NEGATIVE
Staphylococcus aureus: NEGATIVE

## 2013-03-23 LAB — PROTIME-INR
INR: 0.96 (ref 0.00–1.49)
Prothrombin Time: 12.6 seconds (ref 11.6–15.2)

## 2013-03-23 LAB — ABO/RH: ABO/RH(D): B POS

## 2013-03-23 LAB — APTT: aPTT: 36 seconds (ref 24–37)

## 2013-03-23 MED ORDER — ALBUTEROL SULFATE (5 MG/ML) 0.5% IN NEBU
2.5000 mg | INHALATION_SOLUTION | Freq: Once | RESPIRATORY_TRACT | Status: AC
  Administered 2013-03-23: 2.5 mg via RESPIRATORY_TRACT

## 2013-03-23 NOTE — Pre-Procedure Instructions (Signed)
Peggy French  03/23/2013   Your procedure is scheduled on: Tuesday, September 16th.  Report to [facility] at 5:30AM.  Call this number if you have problems the morning of surgery: [phone]   Remember:   Do not eat food or drink liquids after midnight.   Take these medicines the morning of surgery with A SIP OF WATER: amLODipine (NORVASC) ,fexofenadine (ALLEGRA), gabapentin (NEURONTIN), propranolol-hydrochlorothiazide (INDERIDE).  Take if needed:traMADol [name]).      Do not wear jewelry, make-up or nail polish.  Do not wear lotions, powders, or perfumes. You may wear deodorant.  Do not shave 48 hours prior to surgery.   Do not bring valuables to the hospital.  [name] is not responsible for any belongings or valuables.  Contacts, dentures or bridgework may not be worn into surgery.  Leave suitcase in the car. After surgery it may be brought to your room.  For patients admitted to the hospital, checkout time is 11:00 AM the day of discharge.     Special Instructions: Shower using CHG 2 nights before surgery and the night before surgery.  If you shower the day of surgery use CHG.  Use special wash - you have one bottle of CHG for all showers.  You should use approximately 1/3 of the bottle for each shower.   Please read over the following fact sheets that you were given: Pain Booklet, Coughing and Deep Breathing, Blood Transfusion Information and Surgical Site Infection Prevention

## 2013-03-25 LAB — URINE CULTURE: Colony Count: 10000

## 2013-03-26 MED ORDER — DEXTROSE 5 % IV SOLN
1.5000 g | INTRAVENOUS | Status: AC
  Administered 2013-03-27: 1.5 g via INTRAVENOUS
  Filled 2013-03-26: qty 1.5

## 2013-03-27 ENCOUNTER — Inpatient Hospital Stay (HOSPITAL_COMMUNITY): Payer: Medicare Other | Admitting: Anesthesiology

## 2013-03-27 ENCOUNTER — Encounter (HOSPITAL_COMMUNITY): Payer: Self-pay | Admitting: *Deleted

## 2013-03-27 ENCOUNTER — Encounter (HOSPITAL_COMMUNITY)
Admission: RE | Disposition: A | Discharge status: Home / Self Care | Payer: Self-pay | Source: Ambulatory Visit | Attending: Cardiothoracic Surgery

## 2013-03-27 ENCOUNTER — Inpatient Hospital Stay (HOSPITAL_COMMUNITY)
Admission: RE | Admit: 2013-03-27 | Discharge: 2013-03-31 | DRG: 164 | Disposition: A | Discharge status: Home / Self Care | Payer: Medicare Other | Source: Ambulatory Visit | Attending: Cardiothoracic Surgery | Admitting: Cardiothoracic Surgery

## 2013-03-27 ENCOUNTER — Encounter (HOSPITAL_COMMUNITY): Payer: Self-pay | Admitting: Anesthesiology

## 2013-03-27 ENCOUNTER — Inpatient Hospital Stay (HOSPITAL_COMMUNITY): Payer: Medicare Other

## 2013-03-27 DIAGNOSIS — Z85038 Personal history of other malignant neoplasm of large intestine: Secondary | ICD-10-CM | POA: Diagnosis not present

## 2013-03-27 DIAGNOSIS — Z9221 Personal history of antineoplastic chemotherapy: Secondary | ICD-10-CM

## 2013-03-27 DIAGNOSIS — Z79899 Other long term (current) drug therapy: Secondary | ICD-10-CM | POA: Diagnosis not present

## 2013-03-27 DIAGNOSIS — G4730 Sleep apnea, unspecified: Secondary | ICD-10-CM | POA: Diagnosis present

## 2013-03-27 DIAGNOSIS — E1149 Type 2 diabetes mellitus with other diabetic neurological complication: Secondary | ICD-10-CM | POA: Diagnosis not present

## 2013-03-27 DIAGNOSIS — C7800 Secondary malignant neoplasm of unspecified lung: Principal | ICD-10-CM | POA: Diagnosis present

## 2013-03-27 DIAGNOSIS — E119 Type 2 diabetes mellitus without complications: Secondary | ICD-10-CM | POA: Diagnosis not present

## 2013-03-27 DIAGNOSIS — R918 Other nonspecific abnormal finding of lung field: Secondary | ICD-10-CM | POA: Diagnosis not present

## 2013-03-27 DIAGNOSIS — I2510 Atherosclerotic heart disease of native coronary artery without angina pectoris: Secondary | ICD-10-CM | POA: Diagnosis present

## 2013-03-27 DIAGNOSIS — M109 Gout, unspecified: Secondary | ICD-10-CM | POA: Diagnosis present

## 2013-03-27 DIAGNOSIS — K3184 Gastroparesis: Secondary | ICD-10-CM | POA: Diagnosis not present

## 2013-03-27 DIAGNOSIS — J90 Pleural effusion, not elsewhere classified: Secondary | ICD-10-CM | POA: Diagnosis not present

## 2013-03-27 DIAGNOSIS — Z23 Encounter for immunization: Secondary | ICD-10-CM | POA: Diagnosis not present

## 2013-03-27 DIAGNOSIS — R911 Solitary pulmonary nodule: Secondary | ICD-10-CM | POA: Diagnosis not present

## 2013-03-27 DIAGNOSIS — E785 Hyperlipidemia, unspecified: Secondary | ICD-10-CM | POA: Diagnosis present

## 2013-03-27 DIAGNOSIS — I252 Old myocardial infarction: Secondary | ICD-10-CM

## 2013-03-27 DIAGNOSIS — K219 Gastro-esophageal reflux disease without esophagitis: Secondary | ICD-10-CM | POA: Diagnosis present

## 2013-03-27 DIAGNOSIS — E876 Hypokalemia: Secondary | ICD-10-CM | POA: Diagnosis not present

## 2013-03-27 DIAGNOSIS — D381 Neoplasm of uncertain behavior of trachea, bronchus and lung: Secondary | ICD-10-CM

## 2013-03-27 DIAGNOSIS — C189 Malignant neoplasm of colon, unspecified: Secondary | ICD-10-CM | POA: Diagnosis present

## 2013-03-27 DIAGNOSIS — I10 Essential (primary) hypertension: Secondary | ICD-10-CM | POA: Diagnosis not present

## 2013-03-27 DIAGNOSIS — K5900 Constipation, unspecified: Secondary | ICD-10-CM | POA: Diagnosis not present

## 2013-03-27 DIAGNOSIS — D649 Anemia, unspecified: Secondary | ICD-10-CM | POA: Diagnosis not present

## 2013-03-27 DIAGNOSIS — J9819 Other pulmonary collapse: Secondary | ICD-10-CM | POA: Diagnosis not present

## 2013-03-27 HISTORY — PX: VIDEO ASSISTED THORACOSCOPY (VATS)/WEDGE RESECTION: SHX6174

## 2013-03-27 HISTORY — DX: Secondary malignant neoplasm of unspecified lung: C18.9

## 2013-03-27 HISTORY — DX: Malignant neoplasm of colon, unspecified: C78.00

## 2013-03-27 HISTORY — PX: VIDEO BRONCHOSCOPY: SHX5072

## 2013-03-27 LAB — GLUCOSE, CAPILLARY
Glucose-Capillary: 113 mg/dL — ABNORMAL HIGH (ref 70–99)
Glucose-Capillary: 158 mg/dL — ABNORMAL HIGH (ref 70–99)
Glucose-Capillary: 136 mg/dL — ABNORMAL HIGH (ref 70–99)
Glucose-Capillary: 107 mg/dL — ABNORMAL HIGH (ref 70–99)

## 2013-03-27 SURGERY — BRONCHOSCOPY, VIDEO-ASSISTED
Anesthesia: General | Site: Chest | Wound class: Clean Contaminated

## 2013-03-27 MED ORDER — PANTOPRAZOLE SODIUM 40 MG PO TBEC
40.0000 mg | DELAYED_RELEASE_TABLET | Freq: Every day | ORAL | Status: DC
  Administered 2013-03-27 – 2013-03-31 (×5): 40 mg via ORAL
  Filled 2013-03-27 (×4): qty 1

## 2013-03-27 MED ORDER — HYDROMORPHONE HCL PF 1 MG/ML IJ SOLN
0.2500 mg | INTRAMUSCULAR | Status: DC | PRN
  Administered 2013-03-27 (×4): 0.5 mg via INTRAVENOUS

## 2013-03-27 MED ORDER — NEOSTIGMINE METHYLSULFATE 1 MG/ML IJ SOLN
INTRAMUSCULAR | Status: DC | PRN
  Administered 2013-03-27: 4 mg via INTRAVENOUS

## 2013-03-27 MED ORDER — VECURONIUM BROMIDE 10 MG IV SOLR: INTRAVENOUS | Status: DC | PRN

## 2013-03-27 MED ORDER — PROPRANOLOL-HCTZ 40-25 MG PO TABS: 1.0000 | ORAL_TABLET | Freq: Every day | ORAL | Status: DC

## 2013-03-27 MED ORDER — LACTATED RINGERS IV SOLN
INTRAVENOUS | Status: DC | PRN
  Administered 2013-03-27: 07:00:00 via INTRAVENOUS

## 2013-03-27 MED ORDER — DEXTROSE 5 % IV SOLN
1.5000 g | Freq: Two times a day (BID) | INTRAVENOUS | Status: AC
  Administered 2013-03-27 – 2013-03-28 (×2): 1.5 g via INTRAVENOUS
  Filled 2013-03-27 (×2): qty 1.5

## 2013-03-27 MED ORDER — TRAMADOL HCL 50 MG PO TABS
50.0000 mg | ORAL_TABLET | Freq: Four times a day (QID) | ORAL | Status: DC | PRN
  Administered 2013-03-29: 50 mg via ORAL
  Filled 2013-03-27: qty 1

## 2013-03-27 MED ORDER — FENTANYL 10 MCG/ML IV SOLN
INTRAVENOUS | Status: DC
  Administered 2013-03-27: 13:00:00 via INTRAVENOUS
  Administered 2013-03-27: 100 ug via INTRAVENOUS
  Administered 2013-03-27: 162 ug via INTRAVENOUS
  Administered 2013-03-28: 80 ug via INTRAVENOUS
  Administered 2013-03-28: 114 ug via INTRAVENOUS
  Administered 2013-03-28: 10 ug via INTRAVENOUS
  Administered 2013-03-28: 62 ug via INTRAVENOUS
  Administered 2013-03-28: 175.9 ug via INTRAVENOUS
  Administered 2013-03-28: 114 ug via INTRAVENOUS
  Administered 2013-03-28: 05:00:00 via INTRAVENOUS
  Administered 2013-03-28: 20 ug via INTRAVENOUS
  Administered 2013-03-29: 50 ug via INTRAVENOUS
  Administered 2013-03-29: 10 ug via INTRAVENOUS
  Filled 2013-03-27 (×4): qty 50

## 2013-03-27 MED ORDER — POTASSIUM CHLORIDE 10 MEQ/50ML IV SOLN
10.0000 meq | Freq: Every day | INTRAVENOUS | Status: DC | PRN
  Administered 2013-03-28 – 2013-03-29 (×5): 10 meq via INTRAVENOUS
  Filled 2013-03-27 (×4): qty 50

## 2013-03-27 MED ORDER — SUCCINYLCHOLINE CHLORIDE 20 MG/ML IJ SOLN
INTRAMUSCULAR | Status: DC | PRN
  Administered 2013-03-27: 80 mg via INTRAVENOUS

## 2013-03-27 MED ORDER — HYDROMORPHONE HCL PF 1 MG/ML IJ SOLN: 0.2500 mg | INTRAMUSCULAR | Status: DC | PRN

## 2013-03-27 MED ORDER — ONDANSETRON HCL 4 MG/2ML IJ SOLN
INTRAMUSCULAR | Status: DC | PRN
  Administered 2013-03-27: 4 mg via INTRAVENOUS

## 2013-03-27 MED ORDER — ONDANSETRON HCL 4 MG/2ML IJ SOLN: 4.0000 mg | Freq: Once | INTRAMUSCULAR | Status: DC | PRN

## 2013-03-27 MED ORDER — PROPRANOLOL HCL 40 MG PO TABS
40.0000 mg | ORAL_TABLET | Freq: Once | ORAL | Status: AC
  Administered 2013-03-27: 40 mg via ORAL
  Filled 2013-03-27: qty 1

## 2013-03-27 MED ORDER — DIPHENHYDRAMINE HCL 50 MG/ML IJ SOLN
12.5000 mg | Freq: Four times a day (QID) | INTRAMUSCULAR | Status: DC | PRN
  Filled 2013-03-27: qty 0.25

## 2013-03-27 MED ORDER — LIDOCAINE HCL (CARDIAC) 20 MG/ML IV SOLN
INTRAVENOUS | Status: DC | PRN
  Administered 2013-03-27: 100 mg via INTRAVENOUS

## 2013-03-27 MED ORDER — GLYCOPYRROLATE 0.2 MG/ML IJ SOLN
INTRAMUSCULAR | Status: DC | PRN
  Administered 2013-03-27: .8 mg via INTRAVENOUS

## 2013-03-27 MED ORDER — DIPHENHYDRAMINE HCL 12.5 MG/5ML PO ELIX
12.5000 mg | ORAL_SOLUTION | Freq: Four times a day (QID) | ORAL | Status: DC | PRN
  Filled 2013-03-27: qty 5

## 2013-03-27 MED ORDER — LABETALOL HCL 5 MG/ML IV SOLN
5.0000 mg | INTRAVENOUS | Status: DC | PRN
  Administered 2013-03-27 (×4): 5 mg via INTRAVENOUS

## 2013-03-27 MED ORDER — RAMIPRIL 5 MG PO CAPS
5.0000 mg | ORAL_CAPSULE | Freq: Every day | ORAL | Status: DC
  Administered 2013-03-30 – 2013-03-31 (×2): 5 mg via ORAL
  Filled 2013-03-27 (×4): qty 1

## 2013-03-27 MED ORDER — OXYCODONE HCL 5 MG PO TABS: 5.0000 mg | ORAL_TABLET | Freq: Once | ORAL | Status: DC | PRN

## 2013-03-27 MED ORDER — AMLODIPINE BESYLATE 5 MG PO TABS
5.0000 mg | ORAL_TABLET | Freq: Every day | ORAL | Status: DC
  Administered 2013-03-27 – 2013-03-31 (×5): 5 mg via ORAL
  Filled 2013-03-27 (×5): qty 1

## 2013-03-27 MED ORDER — SODIUM CHLORIDE 0.9 % IJ SOLN: 9.0000 mL | INTRAMUSCULAR | Status: DC | PRN

## 2013-03-27 MED ORDER — SODIUM CHLORIDE 0.9 % IV SOLN
10.0000 mg | INTRAVENOUS | Status: DC | PRN
  Administered 2013-03-27: 25 ug/min via INTRAVENOUS

## 2013-03-27 MED ORDER — HYDROCHLOROTHIAZIDE 25 MG PO TABS
25.0000 mg | ORAL_TABLET | Freq: Every day | ORAL | Status: DC
  Filled 2013-03-27: qty 1

## 2013-03-27 MED ORDER — MIDAZOLAM HCL 5 MG/5ML IJ SOLN
INTRAMUSCULAR | Status: DC | PRN
  Administered 2013-03-27: 2 mg via INTRAVENOUS

## 2013-03-27 MED ORDER — PNEUMOCOCCAL VAC POLYVALENT 25 MCG/0.5ML IJ INJ
0.5000 mL | INJECTION | INTRAMUSCULAR | Status: AC | PRN
  Administered 2013-03-31: 0.5 mL via INTRAMUSCULAR

## 2013-03-27 MED ORDER — MICROFIBRILLAR COLL HEMOSTAT EX PADS
MEDICATED_PAD | CUTANEOUS | Status: DC | PRN
  Administered 2013-03-27: 1 via TOPICAL

## 2013-03-27 MED ORDER — GABAPENTIN 300 MG PO CAPS
300.0000 mg | ORAL_CAPSULE | Freq: Four times a day (QID) | ORAL | Status: DC
  Administered 2013-03-27 – 2013-03-31 (×15): 300 mg via ORAL
  Filled 2013-03-27 (×18): qty 1

## 2013-03-27 MED ORDER — OXYCODONE HCL 5 MG/5ML PO SOLN: 5.0000 mg | Freq: Once | ORAL | Status: DC | PRN

## 2013-03-27 MED ORDER — INFLUENZA VAC SPLIT QUAD 0.5 ML IM SUSP: 0.5000 mL | INTRAMUSCULAR | Status: DC | PRN

## 2013-03-27 MED ORDER — PROPOFOL 10 MG/ML IV BOLUS
INTRAVENOUS | Status: DC | PRN
  Administered 2013-03-27: 200 mg via INTRAVENOUS

## 2013-03-27 MED ORDER — ACETAMINOPHEN 160 MG/5ML PO SOLN: 1000.0000 mg | Freq: Four times a day (QID) | ORAL | Status: AC

## 2013-03-27 MED ORDER — INSULIN ASPART 100 UNIT/ML ~~LOC~~ SOLN: 0.0000 [IU] | Freq: Every day | SUBCUTANEOUS | Status: DC

## 2013-03-27 MED ORDER — POTASSIUM CHLORIDE IN NACL 20-0.9 MEQ/L-% IV SOLN
INTRAVENOUS | Status: DC
  Administered 2013-03-27 – 2013-03-29 (×3): via INTRAVENOUS
  Filled 2013-03-27 (×6): qty 1000

## 2013-03-27 MED ORDER — OXYCODONE HCL 5 MG PO TABS
5.0000 mg | ORAL_TABLET | ORAL | Status: AC | PRN
  Administered 2013-03-27 – 2013-03-28 (×2): 10 mg via ORAL
  Filled 2013-03-27 (×2): qty 2

## 2013-03-27 MED ORDER — HYDROMORPHONE HCL PF 1 MG/ML IJ SOLN
INTRAMUSCULAR | Status: AC
  Filled 2013-03-27: qty 1

## 2013-03-27 MED ORDER — ONDANSETRON HCL 4 MG/2ML IJ SOLN: 4.0000 mg | Freq: Four times a day (QID) | INTRAMUSCULAR | Status: DC | PRN

## 2013-03-27 MED ORDER — FENTANYL CITRATE 0.05 MG/ML IJ SOLN
INTRAMUSCULAR | Status: DC | PRN
  Administered 2013-03-27: 50 ug via INTRAVENOUS
  Administered 2013-03-27 (×3): 100 ug via INTRAVENOUS

## 2013-03-27 MED ORDER — ACETAMINOPHEN 500 MG PO TABS
1000.0000 mg | ORAL_TABLET | Freq: Four times a day (QID) | ORAL | Status: AC
  Administered 2013-03-27 – 2013-03-28 (×4): 1000 mg via ORAL
  Filled 2013-03-27 (×3): qty 2

## 2013-03-27 MED ORDER — LABETALOL HCL 5 MG/ML IV SOLN
INTRAVENOUS | Status: AC
  Administered 2013-03-27: 5 mg via INTRAVENOUS
  Filled 2013-03-27: qty 4

## 2013-03-27 MED ORDER — ATORVASTATIN CALCIUM 20 MG PO TABS
20.0000 mg | ORAL_TABLET | Freq: Every day | ORAL | Status: DC
  Administered 2013-03-28 – 2013-03-30 (×3): 20 mg via ORAL
  Filled 2013-03-27 (×4): qty 1

## 2013-03-27 MED ORDER — ONDANSETRON HCL 4 MG/2ML IJ SOLN
4.0000 mg | Freq: Four times a day (QID) | INTRAMUSCULAR | Status: DC | PRN
  Administered 2013-03-28 (×2): 4 mg via INTRAVENOUS
  Filled 2013-03-27 (×3): qty 2

## 2013-03-27 MED ORDER — SENNOSIDES-DOCUSATE SODIUM 8.6-50 MG PO TABS
1.0000 | ORAL_TABLET | Freq: Every evening | ORAL | Status: DC | PRN
  Administered 2013-03-28: 1 via ORAL
  Filled 2013-03-27: qty 1

## 2013-03-27 MED ORDER — HYDROCHLOROTHIAZIDE 25 MG PO TABS
25.0000 mg | ORAL_TABLET | Freq: Every day | ORAL | Status: DC
  Administered 2013-03-27 – 2013-03-29 (×3): 25 mg via ORAL
  Filled 2013-03-27 (×6): qty 1

## 2013-03-27 MED ORDER — 0.9 % SODIUM CHLORIDE (POUR BTL) OPTIME
TOPICAL | Status: DC | PRN
  Administered 2013-03-27: 2000 mL

## 2013-03-27 MED ORDER — PROPRANOLOL HCL 40 MG PO TABS
40.0000 mg | ORAL_TABLET | Freq: Every day | ORAL | Status: DC
  Administered 2013-03-28 – 2013-03-31 (×4): 40 mg via ORAL
  Filled 2013-03-27 (×7): qty 1

## 2013-03-27 MED ORDER — AMLODIPINE BESYLATE 5 MG PO TABS: 5.0000 mg | ORAL_TABLET | Freq: Every day | ORAL | Status: DC

## 2013-03-27 MED ORDER — ROCURONIUM BROMIDE 100 MG/10ML IV SOLN
INTRAVENOUS | Status: DC | PRN
  Administered 2013-03-27 (×2): 25 mg via INTRAVENOUS

## 2013-03-27 MED ORDER — NALOXONE HCL 0.4 MG/ML IJ SOLN
0.4000 mg | INTRAMUSCULAR | Status: DC | PRN
  Filled 2013-03-27: qty 1

## 2013-03-27 MED ORDER — BISACODYL 5 MG PO TBEC
10.0000 mg | DELAYED_RELEASE_TABLET | Freq: Every day | ORAL | Status: DC
  Administered 2013-03-27 – 2013-03-30 (×4): 10 mg via ORAL
  Filled 2013-03-27 (×4): qty 2

## 2013-03-27 MED ORDER — SIMVASTATIN 40 MG PO TABS
40.0000 mg | ORAL_TABLET | Freq: Every day | ORAL | Status: DC
  Administered 2013-03-27: 40 mg via ORAL
  Filled 2013-03-27: qty 1

## 2013-03-27 MED ORDER — OXYCODONE-ACETAMINOPHEN 5-325 MG PO TABS
1.0000 | ORAL_TABLET | ORAL | Status: DC | PRN
  Administered 2013-03-29 – 2013-03-30 (×2): 2 via ORAL
  Filled 2013-03-27 (×2): qty 2

## 2013-03-27 MED ORDER — INSULIN ASPART 100 UNIT/ML ~~LOC~~ SOLN
0.0000 [IU] | Freq: Three times a day (TID) | SUBCUTANEOUS | Status: DC
  Administered 2013-03-27 – 2013-03-28 (×4): 2 [IU] via SUBCUTANEOUS
  Administered 2013-03-29: 3 [IU] via SUBCUTANEOUS
  Administered 2013-03-29: 2 [IU] via SUBCUTANEOUS

## 2013-03-27 SURGICAL SUPPLY — 85 items
ADH SKN CLS APL DERMABOND .7 (GAUZE/BANDAGES/DRESSINGS) ×2
APL SRG 22X2 LUM MLBL SLNT (VASCULAR PRODUCTS)
APL SRG 7X2 LUM MLBL SLNT (VASCULAR PRODUCTS)
APPLICATOR TIP COSEAL (VASCULAR PRODUCTS) IMPLANT
APPLICATOR TIP EXT COSEAL (VASCULAR PRODUCTS) IMPLANT
BLADE SURG 11 STRL SS (BLADE) IMPLANT
BRUSH CYTOL CELLEBRITY 1.5X140 (MISCELLANEOUS) IMPLANT
CANISTER SUCTION 2500CC (MISCELLANEOUS) ×3 IMPLANT
CATH KIT ON Q 5IN SLV (PAIN MANAGEMENT) IMPLANT
CATH THORACIC 28FR (CATHETERS) IMPLANT
CATH THORACIC 36FR (CATHETERS) IMPLANT
CATH THORACIC 36FR RT ANG (CATHETERS) IMPLANT
CLIP TI MEDIUM 6 (CLIP) IMPLANT
CONN ST 1/4X3/8  BEN (MISCELLANEOUS)
CONN ST 1/4X3/8 BEN (MISCELLANEOUS) IMPLANT
CONN Y 3/8X3/8X3/8  BEN (MISCELLANEOUS)
CONN Y 3/8X3/8X3/8 BEN (MISCELLANEOUS) IMPLANT
CONT SPEC 4OZ CLIKSEAL STRL BL (MISCELLANEOUS) ×12 IMPLANT
COVER TABLE BACK 60X90 (DRAPES) ×3 IMPLANT
DERMABOND ADVANCED (GAUZE/BANDAGES/DRESSINGS) ×1
DERMABOND ADVANCED .7 DNX12 (GAUZE/BANDAGES/DRESSINGS) ×2 IMPLANT
DRAIN CHANNEL 28F RND 3/8 FF (WOUND CARE) ×3 IMPLANT
DRAPE LAPAROSCOPIC ABDOMINAL (DRAPES) ×3 IMPLANT
DRAPE WARM FLUID 44X44 (DRAPE) ×3 IMPLANT
DRILL BIT 7/64X5 (BIT) IMPLANT
DRILL BIT TWIST 2.9 (BIT) ×3 IMPLANT
DRSG AQUACEL AG ADV 3.5X14 (GAUZE/BANDAGES/DRESSINGS) ×3 IMPLANT
ELECT BLADE 4.0 EZ CLEAN MEGAD (MISCELLANEOUS) ×3
ELECT REM PT RETURN 9FT ADLT (ELECTROSURGICAL) ×3
ELECTRODE BLDE 4.0 EZ CLN MEGD (MISCELLANEOUS) ×2 IMPLANT
ELECTRODE REM PT RTRN 9FT ADLT (ELECTROSURGICAL) ×2 IMPLANT
FORCEPS BIOP RJ4 1.8 (CUTTING FORCEPS) IMPLANT
GLOVE BIO SURGEON STRL SZ 6.5 (GLOVE) ×6 IMPLANT
GLOVE BIOGEL PI IND STRL 7.0 (GLOVE) ×8 IMPLANT
GLOVE BIOGEL PI INDICATOR 7.0 (GLOVE) ×4
GOWN STRL NON-REIN LRG LVL3 (GOWN DISPOSABLE) ×12 IMPLANT
HANDLE STAPLE ENDO GIA SHORT (STAPLE) ×1
KIT BASIN OR (CUSTOM PROCEDURE TRAY) ×3 IMPLANT
KIT ROOM TURNOVER OR (KITS) ×3 IMPLANT
KIT SUCTION CATH 14FR (SUCTIONS) ×3 IMPLANT
MARKER SKIN DUAL TIP RULER LAB (MISCELLANEOUS) ×3 IMPLANT
NEEDLE BIOPSY TRANSBRONCH 21G (NEEDLE) IMPLANT
NS IRRIG 1000ML POUR BTL (IV SOLUTION) ×6 IMPLANT
OIL SILICONE PENTAX (PARTS (SERVICE/REPAIRS)) ×3 IMPLANT
PACK CHEST (CUSTOM PROCEDURE TRAY) ×3 IMPLANT
PAD ARMBOARD 7.5X6 YLW CONV (MISCELLANEOUS) ×6 IMPLANT
PASSER SUT SWANSON 36MM LOOP (INSTRUMENTS) ×3 IMPLANT
RELOAD EGIA BLACK ROTIC 45MM (STAPLE) ×9 IMPLANT
SCISSORS LAP 5X35 DISP (ENDOMECHANICALS) IMPLANT
SEALANT PROGEL (MISCELLANEOUS) IMPLANT
SEALANT SURG COSEAL 4ML (VASCULAR PRODUCTS) IMPLANT
SEALANT SURG COSEAL 8ML (VASCULAR PRODUCTS) IMPLANT
SOLUTION ANTI FOG 6CC (MISCELLANEOUS) ×6 IMPLANT
SPONGE GAUZE 4X4 12PLY (GAUZE/BANDAGES/DRESSINGS) ×3 IMPLANT
STAPLER ENDO GIA 12MM SHORT (STAPLE) ×2 IMPLANT
SUT PROLENE 3 0 SH DA (SUTURE) IMPLANT
SUT PROLENE 4 0 RB 1 (SUTURE)
SUT PROLENE 4-0 RB1 .5 CRCL 36 (SUTURE) IMPLANT
SUT SILK  1 MH (SUTURE) ×4
SUT SILK 1 MH (SUTURE) ×8 IMPLANT
SUT SILK 2 0 SH (SUTURE) IMPLANT
SUT SILK 2 0SH CR/8 30 (SUTURE) IMPLANT
SUT SILK 3 0SH CR/8 30 (SUTURE) IMPLANT
SUT STEEL 1 (SUTURE) IMPLANT
SUT VIC AB 1 CTX 18 (SUTURE) ×3 IMPLANT
SUT VIC AB 1 CTX 36 (SUTURE)
SUT VIC AB 1 CTX36XBRD ANBCTR (SUTURE) IMPLANT
SUT VIC AB 2-0 CTX 36 (SUTURE) IMPLANT
SUT VIC AB 2-0 UR6 27 (SUTURE) IMPLANT
SUT VIC AB 3-0 SH 8-18 (SUTURE) IMPLANT
SUT VIC AB 3-0 X1 27 (SUTURE) ×6 IMPLANT
SUT VICRYL 2 TP 1 (SUTURE) ×3 IMPLANT
SWAB COLLECTION DEVICE MRSA (MISCELLANEOUS) IMPLANT
SYR 20ML ECCENTRIC (SYRINGE) ×3 IMPLANT
SYSTEM SAHARA CHEST DRAIN ATS (WOUND CARE) ×3 IMPLANT
TAPE CLOTH SURG 4X10 WHT LF (GAUZE/BANDAGES/DRESSINGS) ×3 IMPLANT
TIP APPLICATOR SPRAY EXTEND 16 (VASCULAR PRODUCTS) IMPLANT
TOWEL OR 17X24 6PK STRL BLUE (TOWEL DISPOSABLE) ×6 IMPLANT
TOWEL OR 17X26 10 PK STRL BLUE (TOWEL DISPOSABLE) ×6 IMPLANT
TRAP SPECIMEN MUCOUS 40CC (MISCELLANEOUS) ×3 IMPLANT
TRAY FOLEY CATH 14FRSI W/METER (CATHETERS) ×3 IMPLANT
TUBE ANAEROBIC SPECIMEN COL (MISCELLANEOUS) IMPLANT
TUBE CONNECTING 12X1/4 (SUCTIONS) ×6 IMPLANT
TUNNELER SHEATH ON-Q 11GX8 DSP (PAIN MANAGEMENT) ×3 IMPLANT
WATER STERILE IRR 1000ML POUR (IV SOLUTION) ×6 IMPLANT

## 2013-03-27 NOTE — Brief Op Note (Addendum)
      [address]       [place] [address]             [phone]       03/27/2013  10:00 AM  PATIENT:  Peggy French  72 y.o. female  PRE-OPERATIVE DIAGNOSIS:  1. Left lung nodule 2.History of colon cancer  POST-OPERATIVE DIAGNOSIS:  1. Left lung nodule 2.History of colon cancer 3. 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 Frozen section consistent with adenocarcinoma.  COUNTS CORRECT:  YES  DICTATION: .Dragon Dictation  PLAN OF CARE: Admit to inpatient   PATIENT DISPOSITION:  PACU - hemodynamically stable.   Delay start of Pharmacological VTE agent (>24hrs) due to surgical blood loss or risk of bleeding: yes

## 2013-03-27 NOTE — Anesthesia Preprocedure Evaluation (Addendum)
Anesthesia Evaluation  Patient identified by MRN, date of birth, ID band Patient awake    Reviewed: Allergy & Precautions, H&P , NPO status , Patient's Chart, lab work & pertinent test results  Airway Mallampati: I TM Distance: >3 FB Neck ROM: Full    Dental  (+) Teeth Intact, Partial Upper and Dental Advisory Given   Pulmonary asthma , sleep apnea ,  breath sounds clear to auscultation        Cardiovascular hypertension, Pt. on medications and Pt. on home beta blockers + CAD and + Past MI Rhythm:Regular Rate:Normal     Neuro/Psych    GI/Hepatic hiatal hernia, GERD-  ,  Endo/Other  diabetes, Well Controlled, Type 2, Oral Hypoglycemic AgentsMorbid obesity  Renal/GU      Musculoskeletal   Abdominal   Peds  Hematology   Anesthesia Other Findings   Reproductive/Obstetrics                          Anesthesia Physical Anesthesia Plan  ASA: III  Anesthesia Plan: General   Post-op Pain Management:    Induction: Intravenous  Airway Management Planned: Double Lumen EBT  Additional Equipment: Arterial line and CVP  Intra-op Plan:   Post-operative Plan: Possible Post-op intubation/ventilation  Informed Consent: I have reviewed the patients History and Physical, chart, labs and discussed the procedure including the risks, benefits and alternatives for the proposed anesthesia with the patient or authorized representative who has indicated his/her understanding and acceptance.   Dental advisory given  Plan Discussed with: CRNA, Anesthesiologist and Surgeon  Anesthesia Plan Comments:        Anesthesia Quick Evaluation

## 2013-03-27 NOTE — Progress Notes (Signed)
S/p wedge resection  Resting comfortably  Has been hypertensive but is improving  Doing well early postop

## 2013-03-27 NOTE — Anesthesia Procedure Notes (Signed)
Procedure Name: Intubation Date/Time: 03/27/2013 7:54 AM Performed by: Whitman Hero Pre-anesthesia Checklist: Patient identified, Emergency Drugs available, Suction available, Patient being monitored and Timeout performed Patient Re-evaluated:Patient Re-evaluated prior to inductionOxygen Delivery Method: Circle system utilized Preoxygenation: Pre-oxygenation with 100% oxygen Intubation Type: IV induction Ventilation: Mask ventilation without difficulty Laryngoscope Size: Mac and 4 Grade View: Grade III Endobronchial tube: Left, EBT position confirmed by auscultation, EBT position confirmed by fiberoptic bronchoscope and Double lumen EBT and 35 Fr Number of attempts: 2 Airway Equipment and Method: Stylet Placement Confirmation: ETT inserted through vocal cords under direct vision,  positive ETCO2 and breath sounds checked- equal and bilateral

## 2013-03-27 NOTE — H&P (Signed)
[address]       [place] [address]             [phone]                          Peggy French [facility] Medical Record #[id number] Date of Birth: 1941-04-13  Referring: Dr Truett Perna Primary Care: [name], MD  Chief Complaint:    Lung nodule   History of Present Illness:    Patient is a 72 year old female with a history of colon cancer more than 30 years ago, and then a more recent colon resection by Dr. Daphine Deutscher. June 2013. At that time apT3N2a  With Kras mutation  Was resected. She began adjuvant CAPOX chemotherapy on 02/24/2012. She completed cycle 8 on 07/27/2012.Negative surveillance colonoscopy 12/08/2012 .  Followup CT scan has showed progressively enlarging and 1.2 cm lesion in the left long along the cardiac border, suspicious for malignancy.   Current Activity/ Functional Status:  Patient is independent with mobility/ambulation, transfers, ADL's, IADL's.  Zubrod Score: At the time of surgery this patient's most appropriate activity status/level should be described as: []  Normal activity, no symptoms [x]  Symptoms, fully ambulatory []  Symptoms, in bed less than or equal to 50% of the time []  Symptoms, in bed greater than 50% of the time but less than 100% []  Bedridden []  Moribund   Past Medical History  Diagnosis Date  . Gout   . Diabetes mellitus     x over 10 yrs  . Hyperlipidemia   . GERD (gastroesophageal reflux disease)   . Chest pain   . H/O hiatal hernia   . Headache(784.0)     hx migraines  . Arthritis   . Sleep apnea     does not have CPAP machine  sleep study done on 05/19/2011  AHI during total sleep time (3h 53 min) 13.1/hr and REM sleep at 46.7/hr  . Cancer     LOV Dr Truett Perna 02/14/12 EPIC  . Asthmatic bronchitis 2011  . Hypertension     EKG 10/12 EPIC, chest- 1 view  6/13 EPIC    Last 2D Echo on 05/02/2012 showed EF of greater than 55%  . Coronary artery disease -cardiac cath Dr Rennis Golden 2012 - no  significant stenosis   . Myocardial infarction     2001    Past Surgical History  Procedure Laterality Date  . Cholecystectomy    . Abdominal hysterectomy    . Colon surgery      Tumor  . Esophagogastroduodenoscopy  06/16/2011    Procedure: ESOPHAGOGASTRODUODENOSCOPY (EGD);  Surgeon: Malissa Hippo, MD;  Location: [facility];  Service: Endoscopy;  Laterality: N/A;  2:15  . Colonoscopy  11/26/2011    Procedure: COLONOSCOPY;  Surgeon: Malissa Hippo, MD;  Location: [facility];  Service: Endoscopy;  Laterality: N/A;  2:15  . Appendectomy    . Cardiac catheterization  2001,2012  . Portacath placement  02/22/2012    Procedure: INSERTION PORT-A-CATH;  Surgeon: Valarie Merino, MD;  Location: [facility];  Service: General;  Laterality: N/A;  left subclavian  . Colonoscopy N/A 12/08/2012    Procedure: COLONOSCOPY;  Surgeon: Malissa Hippo, MD;  Location: [facility];  Service: Endoscopy;  Laterality: N/A;  815-moved to 23 Ann to notify pt    Family History  Problem Relation Age of Onset  . Cancer Maternal Aunt  throat,     History   Social History  . Marital Status: Married    Spouse Name: N/A    Number of Children: 4  . Years of Education: N/A   Occupational History  .     Social History Main Topics  . Smoking status: Never Smoker   . Smokeless tobacco: Never Used  . Alcohol Use: No  . Drug Use: No  . Sexual Activity: Not on file   Other Topics Concern  . Not on file   Social History Narrative   Married to husband, Molly Maduro   Retired Scientist, product/process development    History  Smoking status  . Never Smoker   Smokeless tobacco  . Never Used    History  Alcohol Use No     Allergies  Allergen Reactions  . Aspirin Palpitations    Current Facility-Administered Medications  Medication Dose Route Frequency Provider Last Rate Last Dose  . cefUROXime (ZINACEF) 1.5 g in dextrose 5 % 50 mL IVPB  1.5 g Intravenous 60 min Pre-Op Delight Ovens, MD        Facility-Administered Medications Ordered in Other Encounters  Medication Dose Route Frequency Provider Last Rate Last Dose  . sodium chloride 0.9 % injection 10 mL  10 mL Intracatheter PRN [name], MD           Review of Systems:     Cardiac Review of Systems: Y or N  Chest Pain [  n  ]  Resting SOB [  n ] Exertional SOB  [ mild ]  Orthopnea [ n ]   Pedal Edema [ n  ]    Palpitations [n  ] Syncope  [ n ]   Presyncope [n   ]  General Review of Systems: [Y] = yes [  ]=no Constitional: recent weight change [ n ]; anorexia [  ]; fatigue [place]  ]; nausea [  ]; night sweats [ n ]; fever [ n ]; or chills [  n];                                                                                                                                          Dental: poor dentition[ dentures ]; Last Dentist visit:   Eye : blurred vision [ n ]; diplopia [   ]; vision changes [  ];  Amaurosis fugax[  ]; Resp: cough [  n];  wheezing[n  ];  hemoptysis[ n ]; shortness of breath[n  ]; paroxysmal nocturnal dyspnea[n  ]; dyspnea on exertion[ y ]; or orthopnea[  ];  GI:  gallstones[  ], vomiting[  ];  dysphagia[  ]; melena[  ];  hematochezia [  ]; heartburn[  ];   Hx of  Colonoscopy[  ]; GU: kidney stones [  ]; hematuria[  ];   dysuria [  ];  nocturia[  ];  history of  obstruction [  ]; urinary frequency [  ]             Skin: rash, swelling[  ];, hair loss[  ];  peripheral edema[  ];  or itching[  ]; Musculosketetal: myalgias[  ];  joint swelling[  ];  joint erythema[  ];  joint pain[  ];  back pain[  ];  Heme/Lymph: bruising[  ];  bleeding[ n ];  anemia[  ];  Neuro: TIA[  ];  headaches[  ];  stroke[  ];  vertigo[  ];  seizures[n  ];   paresthesias[n  ];  difficulty walking[ n ];  Psych:depression[  ]; anxiety[  ];  Endocrine: diabetes[12 years  ];  thyroid dysfunction[  ];  Immunizations: Flu [ ? ]; Pneumococcal[ ? ];  Other:  Physical Exam: BP 165/79  Pulse 104  Temp(Src) 97.5 F (36.4 C) (Oral)   Resp 20  SpO2 98%  General appearance: alert and cooperative Neurologic: intact Heart: regular rate and rhythm, S1, S2 normal, no murmur, click, rub or gallop and normal apical impulse Lungs: clear to auscultation bilaterally and normal percussion bilaterally Abdomen: soft, non-tender; bowel sounds normal; no masses,  no organomegaly Extremities: extremities normal, atraumatic, no cyanosis or edema and Homans sign is negative, no sign of DVT Wound: rt mid abdominal incision well healed    Diagnostic Studies & Laboratory data:     Recent Radiology Findings:   Ct Chest Wo Contrast  02/22/2013   *RADIOLOGY REPORT*  Clinical Data: Follow up indeterminate left lung nodule.  Colon carcinoma.  CT CHEST WITHOUT CONTRAST  Technique:  Multidetector CT imaging of the chest was performed following the standard protocol without IV contrast.  Comparison: 11/27/2012  Findings: 11 x 12 mm pulmonary nodule in the anterior left lower lobe on image 39 is increased in size since previous study when measured 7 x 9 mm.  This has the lobulated margins, and may represent a primary bronchogenic carcinoma rather than metastasis. No other suspicious pulmonary nodules or masses are identified.  No evidence of pulmonary infiltrate or central endobronchial lesion.  No evidence of pleural or pericardial effusion.  No evidence of hilar mediastinal masses.  No adenopathy seen elsewhere within the thorax.  Mild goiter again noted.  No suspicious bone lesions are identified.  IMPRESSION:  1.  Increased size of 12 mm lobulated nodule in the anterior left lower lobe.  This is suspicious for primary bronchogenic carcinoma, although a solitary metastasis cannot definitely be excluded. Consider PET CT and/or biopsy for further evaluation. 2.  No evidence of lymphadenopathy or pleural effusion. 3.  Stable mild goiter.   Original Report Authenticated By: Myles Rosenthal, M.D.   Nm Pet Image Restag (ps) Skull Base To Thigh  03/02/2013    *RADIOLOGY REPORT*  Clinical Data:  Initial treatment strategy for lung nodule.  The patient also with history of colon cancer.  NUCLEAR MEDICINE PET WHOLE BODY  Fasting Blood Glucose:  108  Technique:  17.2 mCi F-18 FDG was injected intravenously. CT data was obtained and used for attenuation correction and anatomic localization only.  (This was not acquired as a diagnostic CT examination.) Additional exam technical data entered on technologist worksheet.  Comparison:  Chest CT from 02/22/2013, 06/12/2012, and 12/03/2011  Findings:  Head/Neck:  No hypermetabolic lymph nodes are identified in the neck.  The patient is noted to have hypermetabolic uptake in the region of the left fossa of Rosenmuller.  Chest:  No hypermetabolic lymphadenopathy in the chest.  12 mm left lower lobe pulmonary nodule shows no associated hypermetabolism.  Abdomen/Pelvis:  No abnormal hypermetabolic activity within the liver, pancreas, adrenal glands, or spleen.  No hypermetabolic lymph nodes in the abdomen or pelvis.  Skeleton:  No focal hypermetabolic activity to suggest skeletal metastasis.  Extremities:  No hypermetabolic activity to suggest metastasis.  IMPRESSION: The 12 mm left lower lobe pulmonary nodule shows no evidence for hypermetabolism on today's PET scan although when comparing back to 02/22/2013 and 06/12/2012, this has certainly progressed substantially in size since the 2013 exam. Well differentiated and low grade neoplasm can be poorly FDG avid and primary lung neoplasm is not entirely excluded.  Metastatic disease is also a consideration, but is considered less likely.  Abnormal F D G accumulation in the posterior left nasopharyngeal mucosa, in the region of the left fossa of Rosenmuller. 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  MRI without and with contrast or direct visualization recommended to further evaluate.   Original Report Authenticated By: Kennith Center, M.D.   On review I believe that  there is misregistration and infact the left lung nodule is hypermetabolic   Recent Lab Findings: Lab Results  Component Value Date   WBC 5.4 03/23/2013   HGB 13.8 03/23/2013   HCT 39.7 03/23/2013   PLT 178 03/23/2013   GLUCOSE 113* 03/23/2013   CHOL 163 04/02/2011   TRIG 57 04/02/2011   HDL 60 04/02/2011   LDLCALC 92 04/02/2011   ALT 13 03/23/2013   AST 24 03/23/2013   NA 135 03/23/2013   K 3.5 03/23/2013   CL 99 03/23/2013   CREATININE 0.83 03/23/2013   BUN 14 03/23/2013   CO2 22 03/23/2013   TSH 1.908 04/01/2011   INR 0.96 03/23/2013   HGBA1C 6.5* 04/01/2011   Cardiac cath done 2012: Dr Rennis Golden 04/02/2011  DATE OF DISCHARGE:  CARDIAC CATHETERIZATION  OPERATOR: [place] Hilty, MD  INDICATIONS: Chest pain, nausea and vomiting concerning for unstable  angina.  HISTORY OF PRESENT ILLNESS: Ms. Lamika Connolly is a 72 year old female  with a history of diabetes type 2, hypertension, gout and history of  cholecystectomy, status post partial colectomy approximately 20 years  ago who presents now with nausea, vomiting, chest pain and had a cardiac  catheterization last in 2001 which showed normal coronaries with some  RCA spasm. Yesterday presented with chest pain and did rule out for MI  by troponins and is referred for cardiac catheterization.  PROCEDURE IN DETAILS: After informed consent was obtained, the patient  was brought to cardiac catheterization lab, sterilely prepped and draped  in usual fashion after procedural radiation safety time-out. The area  around the right femoral artery was identified, however, pulse was noted  to be very weak. There was neither a good pulse on the left groin  either. That area was then prepped and draped. Fluoroscopy was used to  identify the location of the suspected femoral artery and attempts were  made to access that. Then attempts were made using a "smart" needle  which allowed direct access to the right femoral artery. Once access  was obtained, the  sheath was placed without difficulty and subsequent  catheterization was performed with a JL-4 pigtail catheters and number  of right coronary catheters. Ultimately an AR-1 was used to engage the  right coronary artery which took somewhat of an inferior takeoff. A LV  gram was performed. Estimated blood loss was less than 10 mL. There  were no acute complications. The patient received  1 mg of Versed, 25  mcg of fentanyl for moderate sedation and 10 mL of 1% local lidocaine.  FINDINGS:  1. Left main - no disease.  2. LAD - there is a 20-30% ostial bifurcation stenosis, however,  distally there are only mild luminal irregularities.  3. Left circumflex. There was no significant disease. There were 2  large OM branches also free of disease.  4. Right coronary artery. No spasm was noted. With the catheter was  engaged with an AR-1 as mentioned. There is a dominant vessel with  only mild luminal irregularities.  5. LVEDP = 9 mmHg.  6. LV gram - EF greater than 60%, no wall motion abnormalities.  IMPRESSION:  1. No significant coronary artery disease.  2. Left ventricular ejection fraction greater than 60%.  PLAN: Ms. Peaster continues to have some nausea, vomiting and chest  pressure. I wonder this could be secondary to gastroparesis or possibly  small gastroparesis. I doubt she has a small bowel obstruction as she  has been having a normal bowel movement. She is followed by Dr. Karilyn Cota for  GI and we may consider a need for further workup, perhaps of gastric  emptying study. This may represent some gastroparesis given her  longstanding diabetes. I would also increase her home omeprazole dose to  20 mg by mouth twice daily.  [place] Hilty, MD   PFT done with significant diffusion deficit   Assessment / Plan:     Patient with known carcinoma of the colon now presents with a solitary enlarging left lung lesion. I recommended to the patient proceeding with bronchoscopy left video-assisted  thoracoscopy and wedge resection of the lesion, both for treatment and for diagnosis. The risks and options of observation versus proceeding with resection were discussed in detail. The patient is willing to proceed. The goals risks and alternatives of the planned surgical procedure Bronch, left VATS ling resection have been discussed with the patient in detail. The risks of the procedure including death, infection, stroke, myocardial infarction, bleeding, blood transfusion have all been discussed specifically.  I have quoted Peggy French a 2% of perioperative mortality and a complication rate as high as 20 %. The patient's questions have been answered.Peggy French is willing  to proceed with the planned procedure.    Delight Ovens MD      [address] [place] [place] [address] Office [phone]   Beeper [phone]  03/27/2013 7:42 AM

## 2013-03-27 NOTE — Anesthesia Postprocedure Evaluation (Signed)
  Anesthesia Post-op Note  Patient: Peggy French  Procedure(s) Performed: Procedure(s): VIDEO BRONCHOSCOPY (N/A) VIDEO ASSISTED THORACOSCOPY (VATS)/WEDGE RESECTION (Left)  Patient Location: PACU  Anesthesia Type:General  Level of Consciousness: awake, oriented, sedated and patient cooperative  Airway and Oxygen Therapy: Patient Spontanous Breathing  Post-op Pain: moderate  Post-op Assessment: Post-op Vital signs reviewed, Patient's Cardiovascular Status Stable, Respiratory Function Stable, Patent Airway, No signs of Nausea or vomiting and Pain level controlled  Post-op Vital Signs: stable  Complications: No apparent anesthesia complications

## 2013-03-27 NOTE — Transfer of Care (Signed)
Immediate Anesthesia Transfer of Care Note  Patient: Peggy French  Procedure(s) Performed: Procedure(s): VIDEO BRONCHOSCOPY (N/A) VIDEO ASSISTED THORACOSCOPY (VATS)/WEDGE RESECTION (Left)  Patient Location: PACU  Anesthesia Type:General  Level of Consciousness: awake and alert   Airway & Oxygen Therapy: Patient Spontanous Breathing and Patient connected to face mask  Post-op Assessment: Report given to PACU RN and Post -op Vital signs reviewed and stable  Post vital signs: Reviewed and stable  Complications: No apparent anesthesia complications

## 2013-03-27 NOTE — Progress Notes (Signed)
Utilization Review Completed.Peggy French T9/16/2014

## 2013-03-28 ENCOUNTER — Inpatient Hospital Stay (HOSPITAL_COMMUNITY): Payer: Medicare Other

## 2013-03-28 DIAGNOSIS — R918 Other nonspecific abnormal finding of lung field: Secondary | ICD-10-CM | POA: Diagnosis not present

## 2013-03-28 LAB — BASIC METABOLIC PANEL
BUN: 10 mg/dL (ref 6–23)
CO2: 26 mEq/L (ref 19–32)
Chloride: 97 mEq/L (ref 96–112)
Creatinine, Ser: 0.76 mg/dL (ref 0.50–1.10)
GFR calc Af Amer: >90 mL/min (ref >90)
Glucose, Bld: 143 mg/dL — ABNORMAL HIGH (ref 70–99)
Potassium: 3 mEq/L — ABNORMAL LOW (ref 3.5–5.1)

## 2013-03-28 LAB — GLUCOSE, CAPILLARY
Glucose-Capillary: 139 mg/dL — ABNORMAL HIGH (ref 70–99)
Glucose-Capillary: 121 mg/dL — ABNORMAL HIGH (ref 70–99)
Glucose-Capillary: 124 mg/dL — ABNORMAL HIGH (ref 70–99)
Glucose-Capillary: 114 mg/dL — ABNORMAL HIGH (ref 70–99)

## 2013-03-28 LAB — CBC
HCT: 37.3 % (ref 36.0–46.0)
MCH: 27.6 pg (ref 26.0–34.0)
MCHC: 35.4 g/dL (ref 30.0–36.0)
RDW: 13.4 % (ref 11.5–15.5)

## 2013-03-28 LAB — BLOOD GAS, ARTERIAL
Acid-Base Excess: 2 mmol/L (ref 0.0–2.0)
Bicarbonate: 25.9 mEq/L — ABNORMAL HIGH (ref 20.0–24.0)
FIO2: 0.21 %
O2 Saturation: 96.8 %
Patient temperature: 98.6
pO2, Arterial: 86.7 mmHg (ref 80.0–100.0)

## 2013-03-28 NOTE — Progress Notes (Addendum)
      [address]       [place] [address]             [phone]      1 Day Post-Op Procedure(s) (LRB): VIDEO BRONCHOSCOPY (N/A) VIDEO ASSISTED THORACOSCOPY (VATS)/WEDGE RESECTION (Left)  Subjective:  Peggy French is without complaints this morning.  Her pain is under control and she states she has been using her IS so she can get her tube out.    Objective: Vital signs in last 24 hours: Temp:  [97.5 F (36.4 C)-99.1 F (37.3 C)] 99.1 F (37.3 C) (09/17 0743) Pulse Rate:  [73-115] 97 (09/17 0700) Cardiac Rhythm:  [-] Sinus tachycardia (09/17 0700) Resp:  [0-28] 22 (09/17 0700) BP: (116-184)/(78-94) 116/87 mmHg (09/17 0700) SpO2:  [94 %-100 %] 94 % (09/17 0700) Arterial Line BP: (171-201)/(71-97) 171/71 mmHg (09/17 0700) Weight:  [204 lb 12.9 oz (92.9 kg)-205 lb (92.987 kg)] 204 lb 12.9 oz (92.9 kg) (09/17 0600)  Intake/Output from previous day: 09/16 0701 - 09/17 0700 In: 2800 [P.O.:180; I.V.:2470; IV Piggyback:150] Out: 2359 [Urine:2305; Chest Tube:54]  General appearance: alert, cooperative and no distress Heart: regular rate and rhythm Lungs: clear to auscultation bilaterally Abdomen: soft, non-tender; bowel sounds normal; no masses,  no organomegaly Wound: clean and dry  Lab Results:  Recent Labs  03/28/13 0350  WBC 10.2  HGB 13.2  HCT 37.3  PLT 158   BMET:  Recent Labs  03/28/13 0350  NA 136  K 3.0*  CL 97  CO2 26  GLUCOSE 143*  BUN 10  CREATININE 0.76  CALCIUM 8.7    PT/INR: No results found for this basename: LABPROT, INR,  in the last 72 hours ABG    Component Value Date/Time   PHART 7.434 03/28/2013 0442   HCO3 25.9* 03/28/2013 0442   TCO2 27.1 03/28/2013 0442   O2SAT 96.8 03/28/2013 0442   CBG (last 3)   Recent Labs  03/27/13 1741 03/27/13 2156 03/28/13 0741  GLUCAP 136* 107* 139*    Assessment/Plan: S/P Procedure(s) (LRB): VIDEO BRONCHOSCOPY (N/A) VIDEO ASSISTED THORACOSCOPY (VATS)/WEDGE RESECTION  (Left)  1. Chest tube- no air leak present, minimal output since surgery, CXR shows no evidence of pneumothorax, can possibly transition chest tube to water seal will discuss with staff 2. Pulm- wean oxygen as tolerated, good use of IS 3. Hypokalemia- will replace with IV per protocol 4. D/C Arterial Line 5. Decrease IV Fluids, start diet 6. D/C Foley catheter 7. Dispo- patient stable, can transfer to 3300   LOS: 1 day    BARRETT, ERIN 03/28/2013  I have seen and examined Peggy French and agree with the above assessment  and plan.  Delight Ovens MD Beeper [phone] Office [phone] 03/28/2013 9:18 AM

## 2013-03-28 NOTE — Op Note (Signed)
NAMEKELISHA, DALL NO.:  [id number]  MEDICAL RECORD NO.:  [id number]  LOCATION:  2S15C                        FACILITY:  [facility]  PHYSICIAN:  Sheliah Plane, MD    DATE OF BIRTH:  [birth year]  DATE OF PROCEDURE:  03/27/2013 DATE OF DISCHARGE:                              OPERATIVE REPORT   PREOPERATIVE DIAGNOSIS:  New left lung mass.  POSTOPERATIVE DIAGNOSIS:  New left lung mass, adenocarcinoma by frozen section, question colon metastasis versus primary tumor.  PROCEDURE PERFORMED:  Bronchoscopy, left video-assisted thoracoscopy, wedge resection, superior segment left lower lobe, and lymph node dissection.  SURGEON:  Sheliah Plane, MD  FIRST ASSISTANT:  Doree Fudge, PA  BRIEF HISTORY:  The patient is a 72 year old female with known node positive colon cancer which has been resected on followup the patient. The patient had evidence of a new left lung lesion developing along the heart border and was referred to Thoracic Surgery for diagnosis and treatment.  With the new development of the lung nodule which was hypermetabolic on PET scan, a surgical resection of both for diagnosis and treatment was recommended to the patient who agreed and signed informed consent.  DESCRIPTION OF PROCEDURE:  The patient underwent general endotracheal anesthesia without incident with a double-lumen endotracheal tube. Bronchoscopy was performed through the endotracheal tube.  Both down both the right and left endotracheal tube lumens to position the tube properly.  The scope was passed to the lower lobes without evidence of endobronchial lesions.  The scope was removed.  The patient was then turned in lateral decubitus position with left side up.  A VATS port incision was made in the anterior axillary line 4th intercostal space. This gave good visualization with a 30-degree scope.  There were very few pulmonary adhesions.  A 2nd small incision was made slightly  more lower and more anterior through the 2 incisions.  The lung was able to be palpated and a 1.2 cm, firm mass in the superior segment of the left lower lobe was isolated and using purple staplers, a generous wedge resection was obtained with at least in the raw tissue 2 cm margin. With the scope, large 4L lymph nodes were identified and resected for pathologic examination.  A single 28 chest tube was left in place through the most posterior port site.  The lung was reinflated.  The more anterior port site was closed with interrupted 0 Vicryl 2-0 Vicryl in a running subcuticular stitch.  Frozen section on the specimen confirmed adenocarcinoma.  Final identification of this is metastasis or a primary lung tumor is pending further stains.  Blood loss was minimal.  Sponge and needle count was reported as correct at the completion of procedure.  The patient tolerated the procedure without obvious complication, was extubated in the operating room and transferred to recovery room for further postop care.     Sheliah Plane, MD     EG/MEDQ  D:  03/28/2013  T:  03/28/2013  Job:  [id number]

## 2013-03-28 NOTE — Progress Notes (Signed)
Pt admitted from 2S15. Vital signs stable and pt oriented to room. Family present at bedside.

## 2013-03-29 ENCOUNTER — Other Ambulatory Visit: Payer: Self-pay | Admitting: *Deleted

## 2013-03-29 ENCOUNTER — Encounter (HOSPITAL_COMMUNITY): Payer: Self-pay | Admitting: Cardiothoracic Surgery

## 2013-03-29 ENCOUNTER — Inpatient Hospital Stay (HOSPITAL_COMMUNITY): Payer: Medicare Other

## 2013-03-29 DIAGNOSIS — R918 Other nonspecific abnormal finding of lung field: Secondary | ICD-10-CM

## 2013-03-29 DIAGNOSIS — J90 Pleural effusion, not elsewhere classified: Secondary | ICD-10-CM | POA: Diagnosis not present

## 2013-03-29 LAB — COMPREHENSIVE METABOLIC PANEL
ALT: 16 U/L (ref 0–35)
AST: 26 U/L (ref 0–37)
Albumin: 2.9 g/dL — ABNORMAL LOW (ref 3.5–5.2)
CO2: 32 mEq/L (ref 19–32)
Calcium: 8.6 mg/dL (ref 8.4–10.5)
Creatinine, Ser: 0.76 mg/dL (ref 0.50–1.10)
GFR calc non Af Amer: 83 mL/min — ABNORMAL LOW (ref >90)
Sodium: 137 mEq/L (ref 135–145)
Total Protein: 7.2 g/dL (ref 6.0–8.3)

## 2013-03-29 LAB — TYPE AND SCREEN
ABO/RH(D): B POS
Antibody Screen: NEGATIVE
Unit division: 0
Unit division: 0

## 2013-03-29 LAB — CBC
MCH: 27.6 pg (ref 26.0–34.0)
MCHC: 35 g/dL (ref 30.0–36.0)
MCV: 78.8 fL (ref 78.0–100.0)
Platelets: 174 10*3/uL (ref 150–400)
RBC: 4.71 MIL/uL (ref 3.87–5.11)
RDW: 13.7 % (ref 11.5–15.5)

## 2013-03-29 LAB — GLUCOSE, CAPILLARY
Glucose-Capillary: 145 mg/dL — ABNORMAL HIGH (ref 70–99)
Glucose-Capillary: 174 mg/dL — ABNORMAL HIGH (ref 70–99)

## 2013-03-29 MED ORDER — FUROSEMIDE 10 MG/ML IJ SOLN
INTRAMUSCULAR | Status: AC
  Filled 2013-03-29: qty 4

## 2013-03-29 MED ORDER — POTASSIUM CHLORIDE CRYS ER 20 MEQ PO TBCR
40.0000 meq | EXTENDED_RELEASE_TABLET | Freq: Once | ORAL | Status: AC
  Administered 2013-03-29: 40 meq via ORAL
  Filled 2013-03-29: qty 2

## 2013-03-29 MED ORDER — FUROSEMIDE 10 MG/ML IJ SOLN
20.0000 mg | Freq: Once | INTRAMUSCULAR | Status: AC
  Administered 2013-03-29: 20 mg via INTRAVENOUS

## 2013-03-29 NOTE — Discharge Summary (Signed)
Physician Discharge Summary  Patient ID: Peggy French MRN: [id number] DOB/AGE: 71-18-1942 71 y.o.  Admit date: 03/27/2013 Discharge date: 03/31/2013  Admission Diagnoses:  Patient Active Problem List   Diagnosis Date Noted  . Lung mass- left 03/27/2013  . S/P right colectomy 01/07/2012  . 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 Gout   . Diabetes mellitus   . Hypertension   . Hyperlipidemia    Discharge Diagnoses:   Patient Active Problem List   Diagnosis Date Noted  . Lung mass- left 03/27/2013  . S/P right colectomy 01/07/2012  . 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 Gout   . Diabetes mellitus   . Hypertension   . Hyperlipidemia    Discharged Condition: good  History of Present Illness:   Peggy French is a 72 yo African American Female with known history of colon cancer that occurred over 30 years ago.  She also underwent a Colon Resection performed by Dr. Daphine Deutscher in 2013.  The patient completed adjuvant chemotherapy in 2013.  The patient underwent CT scan which showed a progressively enlarging 1.2 cm in the left lung along the cardiac border.  There was concern this was possibly a malignancy.  The patient underwent PET CT scan which did not show evidence of hypermetabolic activity.  She was referred to [facility] for possible surgical resection.  She was evaluated by Dr. Tyrone Sage on 03/15/2013 at which time it was felt the patient should undergo Bronchoscopy with Left VATS and wedge resection of the lesion.  The risks and benefits of the procedure were explained to the patient and she was agreeable to proceed.    Hospital Course:  The patient presented to [facility] on 03/27/2013.  She was taken to the operating room and underwent Video Bronchoscopy, Left Video Assisted Thoracotomy, Left Mini Thoracotomy, Wedge Resection of Superior Segment of LLL, and Lymph Node Sampling.  The patient tolerated the procedure and was taken to the SICU in stable condition.   The patient has done well post operatively.  She had some issues with hypertension that resolved with initiation of home medications.  Her chest tube has not had any evidence of air leak.  The tube has been weaned off suction and was removed without difficulty. CXR this am shows interval decrease in size of left pleural effusion with improved aeration of the left lung base. No pneumothorax.   Final pathology shows metastatic adenocarcinoma of colorectal primary source and one benign lymph node. The patient will follow up with Dr. Tyrone Sage in 2 weeks with a CXR prior to her appointment.      Significant Diagnostic Studies:   1. Increased size of 12 mm lobulated nodule in the anterior left  lower lobe. This is suspicious for primary bronchogenic carcinoma,  although a solitary metastasis cannot definitely be excluded.  Consider PET CT and/or biopsy for further evaluation.  2. No evidence of lymphadenopathy or pleural effusion.  3. Stable mild goiter.  Treatments: surgery:   Bronchoscopy, left video-assisted thoracoscopy, wedge resection, superior segment left lower lobe, and lymph node  dissection.  Final Pathology: 1. Lung, wedge biopsy/resection, Inferior segment of left lower lobe - METASTATIC ADENOCARCINOMA OF COLORECTAL PRIMARY SOURCE, 1.7 CM. - MARGIN IS NEGATIVE. - SEE COMMENT. 2. Lymph node, biopsy, 4 L - ONE BENIGN LYMPH NODE WITH NO TUMOR SEEN (0/1).  Disposition: Home  Discharge Medications:    Medication List         acetaminophen 500 MG tablet  Commonly  known as:  TYLENOL  Take 1,000 mg by mouth every 6 (six) hours as needed. For pain     amLODipine 5 MG tablet  Commonly known as:  NORVASC  Take 5 mg by mouth daily as needed.     esomeprazole 40 MG capsule  Commonly known as:  NEXIUM  Take 40 mg by mouth every evening.     fexofenadine 180 MG tablet  Commonly known as:  ALLEGRA  Take 180 mg by mouth daily as needed. For Allergies     furosemide 40 MG tablet   Commonly known as:  LASIX  Take 1 tablet (40 mg total) by mouth daily. For 5 days then stop.     gabapentin 100 MG capsule  Commonly known as:  NEURONTIN  Take 300 mg by mouth 4 (four) times daily.     guaiFENesin 600 MG 12 hr tablet  Commonly known as:  MUCINEX  Take 1 tablet (600 mg total) by mouth 2 (two) times daily as needed for congestion.     lidocaine-prilocaine cream  Commonly known as:  EMLA  Apply topically as needed. Apply to port-a-cath 1-2 hours prior to chemo.  Cover with plastic wrap     linagliptin 5 MG Tabs tablet  Commonly known as:  TRADJENTA  Take 5 mg by mouth daily after breakfast.     potassium chloride SA 20 MEQ tablet  Commonly known as:  K-DUR,KLOR-CON  Take 1 tablet (20 mEq total) by mouth daily. For 5 days then stop.     propranolol 40 MG tablet  Commonly known as:  INDERAL  Take 1 tablet (40 mg total) by mouth daily with breakfast. For 5 days     propranolol-hydrochlorothiazide 40-25 MG per tablet  Commonly known as:  INDERIDE  Take 1 tablet by mouth daily with breakfast.  Start taking on:  04/06/2013     ramipril 5 MG capsule  Commonly known as:  ALTACE  Take 5 mg by mouth daily with breakfast.     simvastatin 40 MG tablet  Commonly known as:  ZOCOR  Take 40 mg by mouth at bedtime.     traMADol 50 MG tablet  Commonly known as:  ULTRAM  Take 1 tablet (50 mg total) by mouth every 8 (eight) hours as needed for pain.          Future Appointments Provider Department Dept Phone   04/02/2013 11:00 AM [facility] Nurse [facility] MEDICAL ONCOLOGY [phone]   04/12/2013 11:00 AM Delight Ovens, MD [facility] [phone]   04/24/2013 12:00 PM [facility] MEDICAL ONCOLOGY [phone]   04/24/2013 12:30 PM [name], MD [facility] MEDICAL ONCOLOGY [phone]     Follow-up Information   Follow up with Delight Ovens,  MD On 04/12/2013. (Appointment is at 11:00)    Specialty:  Cardiothoracic Surgery   Contact information:   [address] [place] [address] [place] [address] [phone]       Follow up with [facility] On 04/12/2013. (Please get CXR at 10:00)       Follow up with Thornton Papas, MD. (Call for a follow up appointment)    Specialty:  Oncology   Contact information:   [address] [place] [address] [phone]       Signed: Ardelle Balls PA-C 03/31/2013, 9:53 AM

## 2013-03-29 NOTE — Progress Notes (Addendum)
Chest tube removed per MD order. Pt tolerated procedure well. Will continue to monitor.  

## 2013-03-29 NOTE — Progress Notes (Addendum)
[facility] DAILY ICU PROGRESS NOTE                   [address]            [place] [address]          [phone]   2 Days Post-Op Procedure(s) (LRB): VIDEO BRONCHOSCOPY (N/A) VIDEO ASSISTED THORACOSCOPY (VATS)/WEDGE RESECTION (Left)  Total Length of Stay:  LOS: 2 days   Subjective: feels ok  Objective: Vital signs in last 24 hours: Temp:  [98.2 F (36.8 C)-99.6 F (37.6 C)] 98.8 F (37.1 C) (09/18 0749) Pulse Rate:  [81-110] 109 (09/18 0749) Cardiac Rhythm:  [-] Sinus tachycardia (09/18 0749) Resp:  [0-26] 22 (09/18 0749) BP: (127-149)/(63-93) 127/72 mmHg (09/18 0749) SpO2:  [91 %-100 %] 97 % (09/18 0749) Arterial Line BP: (146)/(65) 146/65 mmHg (09/17 1000)  Filed Weights   03/27/13 1439 03/28/13 0600  Weight: 205 lb (92.987 kg) 204 lb 12.9 oz (92.9 kg)    Weight change:    Hemodynamic parameters for last 24 hours:    Intake/Output from previous day: 09/17 0701 - 09/18 0700 In: 2014 [P.O.:600; I.V.:1364; IV Piggyback:50] Out: 1485 [Urine:1440; Chest Tube:45]  Intake/Output this shift: Total I/O In: 290 [P.O.:240; I.V.:50] Out: 0   Current Meds: Scheduled Meds: . amLODipine  5 mg Oral Daily  . atorvastatin  20 mg Oral q1800  . bisacodyl  10 mg Oral Daily  . furosemide  20 mg Intravenous Once  . gabapentin  300 mg Oral QID  . hydrochlorothiazide  25 mg Oral Q breakfast  . insulin aspart  0-15 Units Subcutaneous TID WC  . insulin aspart  0-5 Units Subcutaneous QHS  . pantoprazole  40 mg Oral Daily  . propranolol  40 mg Oral Q breakfast  . [START ON 03/30/2013] ramipril  5 mg Oral Q breakfast   Continuous Infusions: . 0.9 % NaCl with KCl 20 mEq / L 50 mL/hr at 03/29/13 0800   PRN Meds:.influenza vac split quadrivalent PF, oxyCODONE-acetaminophen, pneumococcal 23 valent vaccine, potassium chloride, senna-docusate, traMADol  General appearance: alert, cooperative and no distress Heart: regular rate and rhythm Lungs: clear to auscultation  bilaterally Abdomen: benign Extremities: no edema Wound: incisions healing well  Lab Results: CBC: Recent Labs  03/28/13 0350 03/29/13 0345  WBC 10.2 11.0*  HGB 13.2 13.0  HCT 37.3 37.1  PLT 158 174   BMET:  Recent Labs  03/28/13 0350 03/29/13 0345  NA 136 137  K 3.0* 3.1*  CL 97 97  CO2 26 32  GLUCOSE 143* 132*  BUN 10 10  CREATININE 0.76 0.76  CALCIUM 8.7 8.6    PT/INR: No results found for this basename: LABPROT, INR,  in the last 72 hours Radiology: Dg Chest Port 1 View  03/29/2013   CLINICAL DATA:  Chest tube  EXAM: PORTABLE CHEST - 1 VIEW  COMPARISON:  03/28/2013  FINDINGS: Left chest tube in good position without pneumothorax. Port-A-Cath tip in central line unchanged.  Increase in left lower lobe density which may be progression of airspace disease and effusion. No significant effusion on the right. Negative for edema.  IMPRESSION: Progression of left lower lobe density consistent with airspace disease and effusion. No pneumothorax. Left chest tube unchanged.   Electronically Signed   By: Marlan Palau M.D.   On: 03/29/2013 08:17   Dg Chest Port 1 View  03/28/2013   *RADIOLOGY REPORT*  Clinical Data: Status post thoracic surgery.  PORTABLE CHEST - 1  VIEW  Comparison: March 27, 2013.  Findings: Left-sided chest tube is unchanged in position.  No definite pneumothorax is noted. Bilateral basilar opacity is noted most consistent with subsegmental atelectasis.  No change is noted in position of left subclavian Port-A-Cath or right internal jugular venous catheter. Stable cardiomediastinal silhouette.  IMPRESSION: No pneumothorax seen.  Left-sided chest tube is unchanged in position.  Mild bilateral basilar opacities are noted most consistent with subsegmental atelectasis.   Original Report Authenticated By: Lupita Raider.,  M.D.   Dg Chest Portable 1 View  03/27/2013   CLINICAL DATA:  Postop VAT S.  EXAM: PORTABLE CHEST - 1 VIEW  COMPARISON:  03/23/2013  FINDINGS: Left  chest tube in place. No visible pneumothorax. Left Port-A-Cath is unchanged. Right central line tip is in the upper right atrium, again without pneumothorax. Low lung volumes with areas of atelectasis and mild vascular congestion.  IMPRESSION: Low lung volumes. Areas of atelectasis bilaterally. No pneumothorax.   Electronically Signed   By: Charlett Nose M.D.   On: 03/27/2013 11:33     Assessment/Plan: S/P Procedure(s) (LRB): VIDEO BRONCHOSCOPY (N/A) VIDEO ASSISTED THORACOSCOPY (VATS)/WEDGE RESECTION (Left)  1 conts to do well- D/C CT 2 replace  K+ 3 sugars ok 4 H/H stable 5 poss home soon 6 cont pulm toilet/mobilize   GOLD,WAYNE E 03/29/2013 9:24 AM  Diagnosis 1. Lung, wedge biopsy/resection, Inferior segment of left lower lobe - METASTATIC ADENOCARCINOMA OF COLORECTAL PRIMARY SOURCE, 1.7 CM. - MARGIN IS NEGATIVE. - SEE COMMENT. 2. Lymph node, biopsy, 4 L - ONE BENIGN LYMPH NODE WITH NO TUMOR SEEN (0/1). Microscopic  I have seen and examined Meredeth Ide and agree with the above assessment  and plan.  Delight Ovens MD Beeper [phone] Office [phone] 03/29/2013 5:52 PM

## 2013-03-30 ENCOUNTER — Inpatient Hospital Stay (HOSPITAL_COMMUNITY): Payer: Medicare Other

## 2013-03-30 DIAGNOSIS — J90 Pleural effusion, not elsewhere classified: Secondary | ICD-10-CM | POA: Diagnosis not present

## 2013-03-30 DIAGNOSIS — J9819 Other pulmonary collapse: Secondary | ICD-10-CM | POA: Diagnosis not present

## 2013-03-30 LAB — BASIC METABOLIC PANEL
BUN: 14 mg/dL (ref 6–23)
CO2: 32 mEq/L (ref 19–32)
Calcium: 8.4 mg/dL (ref 8.4–10.5)
Chloride: 101 mEq/L (ref 96–112)
Creatinine, Ser: 0.8 mg/dL (ref 0.50–1.10)
GFR calc Af Amer: 85 mL/min — ABNORMAL LOW (ref >90)
GFR calc non Af Amer: 73 mL/min — ABNORMAL LOW (ref >90)
Glucose, Bld: 104 mg/dL — ABNORMAL HIGH (ref 70–99)
Potassium: 3.5 mEq/L (ref 3.5–5.1)
Sodium: 140 mEq/L (ref 135–145)

## 2013-03-30 LAB — GLUCOSE, CAPILLARY
Glucose-Capillary: 117 mg/dL — ABNORMAL HIGH (ref 70–99)
Glucose-Capillary: 117 mg/dL — ABNORMAL HIGH (ref 70–99)
Glucose-Capillary: 97 mg/dL (ref 70–99)
Glucose-Capillary: 108 mg/dL — ABNORMAL HIGH (ref 70–99)
Glucose-Capillary: 119 mg/dL — ABNORMAL HIGH (ref 70–99)

## 2013-03-30 LAB — CBC
HCT: 34.7 % — ABNORMAL LOW (ref 36.0–46.0)
Hemoglobin: 11.5 g/dL — ABNORMAL LOW (ref 12.0–15.0)
MCH: 26.4 pg (ref 26.0–34.0)
MCHC: 33.1 g/dL (ref 30.0–36.0)
MCV: 79.8 fL (ref 78.0–100.0)
Platelets: 146 10*3/uL — ABNORMAL LOW (ref 150–400)
RBC: 4.35 MIL/uL (ref 3.87–5.11)
RDW: 14 % (ref 11.5–15.5)
WBC: 8.6 10*3/uL (ref 4.0–10.5)

## 2013-03-30 MED ORDER — POTASSIUM CHLORIDE CRYS ER 20 MEQ PO TBCR
40.0000 meq | EXTENDED_RELEASE_TABLET | Freq: Once | ORAL | Status: AC
  Administered 2013-03-30: 40 meq via ORAL
  Filled 2013-03-30: qty 2

## 2013-03-30 MED ORDER — LIDOCAINE-PRILOCAINE 2.5-2.5 % EX CREA
TOPICAL_CREAM | Freq: Once | CUTANEOUS | Status: AC
  Administered 2013-03-30: 09:00:00 via TOPICAL
  Filled 2013-03-30: qty 5

## 2013-03-30 MED ORDER — LINAGLIPTIN 5 MG PO TABS
5.0000 mg | ORAL_TABLET | Freq: Every day | ORAL | Status: DC
  Administered 2013-03-30 – 2013-03-31 (×2): 5 mg via ORAL
  Filled 2013-03-30 (×2): qty 1

## 2013-03-30 MED ORDER — POTASSIUM CHLORIDE CRYS ER 20 MEQ PO TBCR
20.0000 meq | EXTENDED_RELEASE_TABLET | Freq: Every day | ORAL | Status: DC
  Administered 2013-03-30 – 2013-03-31 (×2): 20 meq via ORAL
  Filled 2013-03-30 (×2): qty 1

## 2013-03-30 MED ORDER — HEPARIN SOD (PORK) LOCK FLUSH 100 UNIT/ML IV SOLN
500.0000 [IU] | INTRAVENOUS | Status: AC | PRN
  Administered 2013-03-30: 500 [IU]

## 2013-03-30 MED ORDER — GUAIFENESIN ER 600 MG PO TB12
600.0000 mg | ORAL_TABLET | Freq: Two times a day (BID) | ORAL | Status: DC
  Administered 2013-03-30 – 2013-03-31 (×3): 600 mg via ORAL
  Filled 2013-03-30 (×4): qty 1

## 2013-03-30 MED ORDER — FUROSEMIDE 40 MG PO TABS
40.0000 mg | ORAL_TABLET | Freq: Every day | ORAL | Status: DC
  Administered 2013-03-30 – 2013-03-31 (×2): 40 mg via ORAL
  Filled 2013-03-30 (×2): qty 1

## 2013-03-30 MED ORDER — LACTULOSE 10 GM/15ML PO SOLN
20.0000 g | Freq: Once | ORAL | Status: DC
  Filled 2013-03-30: qty 30

## 2013-03-30 MED ORDER — HYDROCHLOROTHIAZIDE 25 MG PO TABS: 25.0000 mg | ORAL_TABLET | Freq: Every day | ORAL | Status: DC

## 2013-03-30 NOTE — Progress Notes (Signed)
Utilization review completed.  

## 2013-03-30 NOTE — Progress Notes (Addendum)
      [address]       [place] [address]             [phone]       3 Days Post-Op Procedure(s) (LRB): VIDEO BRONCHOSCOPY (N/A) VIDEO ASSISTED THORACOSCOPY (VATS)/WEDGE RESECTION (Left)  Subjective: Patient passing flatus but no bowel movement yet.  Objective: Vital signs in last 24 hours: Temp:  [97.7 F (36.5 C)-98.2 F (36.8 C)] 98.2 F (36.8 C) (09/19 0700) Pulse Rate:  [80-97] 88 (09/19 0431) Cardiac Rhythm:  [-] Sinus tachycardia (09/18 2000) Resp:  [19-28] 19 (09/19 0431) BP: (100-125)/(47-74) 125/55 mmHg (09/19 0431) SpO2:  [94 %-97 %] 94 % (09/19 0431) Weight:  [93.9 kg (207 lb 0.2 oz)] 93.9 kg (207 lb 0.2 oz) (09/19 0431)      Intake/Output from previous day: 09/18 0701 - 09/19 0700 In: 770 [P.O.:480; I.V.:290] Out: 1150 [Urine:1150]   Physical Exam:  Cardiovascular: RRR Pulmonary: Clear to auscultation on the right;diminished at left base; no rales, wheezes, or rhonchi. Abdomen: Soft, non tender, bowel sounds present. Extremities: Mild bilateral lower extremity edema. Wounds: Clean and dry.  No erythema or signs of infection.   Lab Results: CBC: Recent Labs  03/29/13 0345 03/30/13 0500  WBC 11.0* 8.6  HGB 13.0 11.5*  HCT 37.1 34.7*  PLT 174 146*   BMET:  Recent Labs  03/29/13 0345 03/30/13 0500  NA 137 140  K 3.1* 3.5  CL 97 101  CO2 32 32  GLUCOSE 132* 104*  BUN 10 14  CREATININE 0.76 0.80  CALCIUM 8.6 8.4    PT/INR: No results found for this basename: LABPROT, INR,  in the last 72 hours ABG:  INR: Will add last result for INR, ABG once components are confirmed Will add last 4 CBG results once components are confirmed  Assessment/Plan:  1. CV - SR 2.  Pulmonary - CXR this am appears to show left base opacity (plerual effusion and atelectasis) and no pneumothroax. Encourage incentive spirometer. Check CXR in am. Pathology showed metastatic adenocarcinoma, of color rectal primary source. 3.Anemia-H and  H 11.5 and 34.7 4.Supplement potassium 5.Lasix daily for left pleural effusion 6.Remove central line 7.LOC constipation 8.Dm-CBGs 174/117/117. On Insulin. Will restart Tradjenta. 9.Likely discharge in am  ZIMMERMAN,DONIELLE MPA-C 03/30/2013,8:09 AM  I have seen and examined Peggy French and agree with the above assessment  and plan.  Delight Ovens MD Beeper [phone] Office [phone] 03/30/2013 8:24 AM

## 2013-03-30 NOTE — Progress Notes (Signed)
CL removed per order, pt tolerated well, will continue to monitor.  IV team also at bedside for flushing of port-a-cath

## 2013-03-31 ENCOUNTER — Inpatient Hospital Stay (HOSPITAL_COMMUNITY): Payer: Medicare Other

## 2013-03-31 DIAGNOSIS — J90 Pleural effusion, not elsewhere classified: Secondary | ICD-10-CM | POA: Diagnosis not present

## 2013-03-31 LAB — GLUCOSE, CAPILLARY: Glucose-Capillary: 103 mg/dL — ABNORMAL HIGH (ref 70–99)

## 2013-03-31 MED ORDER — POTASSIUM CHLORIDE CRYS ER 20 MEQ PO TBCR: 20.0000 meq | EXTENDED_RELEASE_TABLET | Freq: Every day | ORAL | Status: DC

## 2013-03-31 MED ORDER — LUNG SURGERY BOOK
Freq: Once | Status: AC
  Administered 2013-03-31: 10:00:00
  Filled 2013-03-31: qty 1

## 2013-03-31 MED ORDER — TRAMADOL HCL 50 MG PO TABS: 50.0000 mg | ORAL_TABLET | Freq: Three times a day (TID) | ORAL | Status: DC | PRN

## 2013-03-31 MED ORDER — PROPRANOLOL-HCTZ 40-25 MG PO TABS: 1.0000 | ORAL_TABLET | Freq: Every day | ORAL | Status: DC

## 2013-03-31 MED ORDER — GUAIFENESIN ER 600 MG PO TB12: 600.0000 mg | ORAL_TABLET | Freq: Two times a day (BID) | ORAL | Status: DC | PRN

## 2013-03-31 MED ORDER — INFLUENZA VAC SPLIT QUAD 0.5 ML IM SUSP: 0.5000 mL | INTRAMUSCULAR | Status: DC | PRN

## 2013-03-31 MED ORDER — PROPRANOLOL HCL 40 MG PO TABS: 40.0000 mg | ORAL_TABLET | Freq: Every day | ORAL | Status: DC

## 2013-03-31 MED ORDER — FUROSEMIDE 40 MG PO TABS: 40.0000 mg | ORAL_TABLET | Freq: Every day | ORAL | Status: DC

## 2013-03-31 MED ORDER — INFLUENZA VAC SPLIT QUAD 0.5 ML IM SUSP
0.5000 mL | INTRAMUSCULAR | Status: AC | PRN
  Administered 2013-03-31: 0.5 mL via INTRAMUSCULAR
  Filled 2013-03-31: qty 0.5

## 2013-03-31 MED ORDER — PNEUMOCOCCAL VAC POLYVALENT 25 MCG/0.5ML IJ INJ
0.5000 mL | INJECTION | INTRAMUSCULAR | Status: DC
Start: 2013-03-31 — End: 2013-03-31
  Filled 2013-03-31: qty 0.5

## 2013-03-31 NOTE — Progress Notes (Signed)
Patient has been discharged home. IV has been removed. Reviewed all discharge instructions with patient as well as her husband. All questions and concerns have been answered.

## 2013-03-31 NOTE — Progress Notes (Signed)
      [address]       [place] [address]             [phone]       4 Days Post-Op Procedure(s) (LRB): VIDEO BRONCHOSCOPY (N/A) VIDEO ASSISTED THORACOSCOPY (VATS)/WEDGE RESECTION (Left)  Subjective: Patient without complaints. Has had bowel movement.Wants to go home.  Objective: Vital signs in last 24 hours: Temp:  [97.8 F (36.6 C)-99.2 F (37.3 C)] 97.8 F (36.6 C) (09/20 0300) Pulse Rate:  [80-86] 80 (09/20 0341) Cardiac Rhythm:  [-] Sinus tachycardia (09/19 2000) Resp:  [15-24] 15 (09/20 0341) BP: (101-129)/(45-98) 117/54 mmHg (09/20 0341) SpO2:  [93 %-97 %] 93 % (09/20 0341)     Intake/Output from previous day: 09/19 0701 - 09/20 0700 In: 1280 [P.O.:1280] Out: 300 [Urine:300]   Physical Exam:  Cardiovascular: RRR Pulmonary: Mostly clear to auscultation on the right;diminished at left base; no rales, wheezes, or rhonchi. Abdomen: Soft, non tender, bowel sounds present. Extremities: Mild bilateral lower extremity edema. Wounds: Clean and dry.  No erythema or signs of infection.   Lab Results: CBC:  Recent Labs  03/29/13 0345 03/30/13 0500  WBC 11.0* 8.6  HGB 13.0 11.5*  HCT 37.1 34.7*  PLT 174 146*   BMET:   Recent Labs  03/29/13 0345 03/30/13 0500  NA 137 140  K 3.1* 3.5  CL 97 101  CO2 32 32  GLUCOSE 132* 104*  BUN 10 14  CREATININE 0.76 0.80  CALCIUM 8.6 8.4    PT/INR: No results found for this basename: LABPROT, INR,  in the last 72 hours ABG:  INR: Will add last result for INR, ABG once components are confirmed Will add last 4 CBG results once components are confirmed  Assessment/Plan:  1. CV - SR 2.  Pulmonary - CXR this am shows decrease in left base opacity (plerual effusion and atelectasis) and no pneumothroax. Encourage incentive spirometer.Pathology showed metastatic adenocarcinoma, of color rectal primary source. Will follow up with oncologist as an outpatient. 3.Anemia-H and H 11.5 and  34.7 4.Supplement potassium 5.Lasix daily for left pleural effusion 6.DM-CBGs 108/119/103. On Insulin. Will restart Tradjenta. 7.Discharge today  Ceyda Peterka MPA-C 03/31/2013,9:23 AM

## 2013-04-09 ENCOUNTER — Other Ambulatory Visit: Payer: Self-pay | Admitting: *Deleted

## 2013-04-09 DIAGNOSIS — R918 Other nonspecific abnormal finding of lung field: Secondary | ICD-10-CM

## 2013-04-12 ENCOUNTER — Ambulatory Visit
Admission: RE | Admit: 2013-04-12 | Discharge: 2013-04-12 | Disposition: A | Discharge status: Home / Self Care | Payer: Medicare Other | Source: Ambulatory Visit | Attending: Cardiothoracic Surgery | Admitting: Cardiothoracic Surgery

## 2013-04-12 ENCOUNTER — Encounter: Payer: Self-pay | Admitting: Cardiothoracic Surgery

## 2013-04-12 ENCOUNTER — Ambulatory Visit (INDEPENDENT_AMBULATORY_CARE_PROVIDER_SITE_OTHER): Payer: Self-pay | Admitting: Cardiothoracic Surgery

## 2013-04-12 DIAGNOSIS — R918 Other nonspecific abnormal finding of lung field: Secondary | ICD-10-CM

## 2013-04-12 DIAGNOSIS — Z09 Encounter for follow-up examination after completed treatment for conditions other than malignant neoplasm: Secondary | ICD-10-CM

## 2013-04-12 DIAGNOSIS — J9819 Other pulmonary collapse: Secondary | ICD-10-CM | POA: Diagnosis not present

## 2013-04-12 DIAGNOSIS — C3430 Malignant neoplasm of lower lobe, unspecified bronchus or lung: Secondary | ICD-10-CM

## 2013-04-12 DIAGNOSIS — J90 Pleural effusion, not elsewhere classified: Secondary | ICD-10-CM | POA: Diagnosis not present

## 2013-04-12 NOTE — Progress Notes (Signed)
[address]       [place] [address]             [phone]                  Peggy French [facility] Medical Record #[id number] Date of Birth: 27-Nov-1940  Peggy Artist, MD Peggy Courier, MD  Chief Complaint:   PostOp Follow Up Visit 03/27/2013  PREOPERATIVE DIAGNOSIS: New left lung mass.  POSTOPERATIVE DIAGNOSIS: New left lung mass, adenocarcinoma by frozen  section, question colon metastasis versus primary tumor.  PROCEDURE PERFORMED: Bronchoscopy, left video-assisted thoracoscopy,  wedge resection, superior segment left lower lobe, and lymph node  dissection.   PATH: 1. Lung, wedge biopsy/resection, Inferior segment of left lower lobe - METASTATIC ADENOCARCINOMA OF COLORECTAL PRIMARY SOURCE, 1.7 CM. - MARGIN IS NEGATIVE. - SEE COMMENT. 2. Lymph node, biopsy, 4 L - ONE BENIGN LYMPH NODE WITH NO TUMOR SEEN (0/1).   History of Present Illness:               History  Smoking status  . Never Smoker   Smokeless tobacco  . Never Used       Allergies  Allergen Reactions  . Aspirin Palpitations    Current Outpatient Prescriptions  Medication Sig Dispense Refill  . acetaminophen (TYLENOL) 500 MG tablet Take 1,000 mg by mouth every 6 (six) hours as needed. For pain      . amLODipine (NORVASC) 5 MG tablet Take 5 mg by mouth daily as needed.       Marland Kitchen esomeprazole (NEXIUM) 40 MG capsule Take 40 mg by mouth every evening.      . fexofenadine (ALLEGRA) 180 MG tablet Take 180 mg by mouth daily as needed. For Allergies      . furosemide (LASIX) 40 MG tablet Take 1 tablet (40 mg total) by mouth daily. For 5 days then stop.  5 tablet  0  . gabapentin (NEURONTIN) 100 MG capsule Take 300 mg by mouth 4 (four) times daily.       Marland Kitchen lidocaine-prilocaine (EMLA) cream Apply topically as needed. Apply to port-a-cath 1-2 hours prior to chemo.  Cover with plastic wrap  30 g  1  . linagliptin (TRADJENTA) 5 MG TABS tablet Take 5 mg by mouth daily  after breakfast.       . potassium chloride SA (K-DUR,KLOR-CON) 20 MEQ tablet Take 1 tablet (20 mEq total) by mouth daily. For 5 days then stop.  5 tablet  0  . propranolol (INDERAL) 40 MG tablet Take 1 tablet (40 mg total) by mouth daily with breakfast. For 5 days  5 tablet  0  . propranolol-hydrochlorothiazide (INDERIDE) 40-25 MG per tablet Take 1 tablet by mouth daily with breakfast.      . ramipril (ALTACE) 5 MG capsule Take 5 mg by mouth daily with breakfast.       . simvastatin (ZOCOR) 40 MG tablet Take 40 mg by mouth at bedtime.       . traMADol (ULTRAM) 50 MG tablet Take 1 tablet (50 mg total) by mouth every 8 (eight) hours as needed for pain.  30 tablet  0   No current facility-administered medications for this visit.   Facility-Administered Medications Ordered in Other Visits  Medication Dose Route Frequency Provider Last Rate Last Dose  . sodium chloride 0.9 % injection 10 mL  10 mL Intracatheter PRN Peggy Artist, MD  Physical Exam: BP 129/78  Pulse 79  Resp 16  Ht 5\' 4"  (1.626 m)  Wt 204 lb (92.534 kg)  BMI 35 kg/m2  SpO2 97%  General appearance: alert and cooperative Neurologic: intact Heart: regular rate and rhythm, S1, S2 normal, no murmur, click, rub or gallop Lungs: clear to auscultation bilaterally and normal percussion bilaterally Abdomen: soft, non-tender; bowel sounds normal; no masses,  no organomegaly Extremities: extremities normal, atraumatic, no cyanosis or edema and Homans sign is negative, no sign of DVT Wound: some erythymia around chest tube sutures, removed today   Diagnostic Studies & Laboratory data:         Recent Radiology Findings: Dg Chest 2 View  04/12/2013   CLINICAL DATA:  72 year old female with lung cancer. Recent surgery. Shortness of Breath. Subsequent encounter.  EXAM: CHEST  2 VIEW  COMPARISON:  03/2013 and earlier.  FINDINGS: Improved lung volumes. Improved ventilation at the left lung base. Small to moderate residual  effusion with atelectasis. No pneumothorax identified. Stable cardiac size and mediastinal contours. Visualized tracheal air column is within normal limits. Of the right lung is grossly clear. Left chest porta cath again noted. No acute osseous abnormality identified.  IMPRESSION: Improved lung volumes. Improved left basilar ventilation with small to moderate residual effusion and atelectasis.   Electronically Signed   By: Augusto Gamble M.D.   On: 04/12/2013 11:17      Recent Labs: Lab Results  Component Value Date   WBC 8.6 03/30/2013   HGB 11.5* 03/30/2013   HCT 34.7* 03/30/2013   PLT 146* 03/30/2013   GLUCOSE 104* 03/30/2013   CHOL 163 04/02/2011   TRIG 57 04/02/2011   HDL 60 04/02/2011   LDLCALC 92 04/02/2011   ALT 16 03/29/2013   AST 26 03/29/2013   NA 140 03/30/2013   K 3.5 03/30/2013   CL 101 03/30/2013   CREATININE 0.80 03/30/2013   BUN 14 03/30/2013   CO2 32 03/30/2013   TSH 1.908 04/01/2011   INR 0.96 03/23/2013   HGBA1C 6.5* 04/01/2011      Assessment / Plan:      Stable progress following wedge resection of left lung lesion which was determined to be an isolated nodule of metastatic adenocarcinoma from the colon. Patient's postop chest x-ray is improving Plan to see her back in one week for wound check She has followup appointment with oncology, at this point they have recommended to her close followup without further chemotherapy.    Peggy French B 04/12/2013 11:47 AM

## 2013-04-12 NOTE — Patient Instructions (Signed)
Keep incision with dry dressing No lifting over 10 lbs Return one week for wound check

## 2013-04-23 ENCOUNTER — Ambulatory Visit (INDEPENDENT_AMBULATORY_CARE_PROVIDER_SITE_OTHER): Payer: Self-pay | Admitting: Physician Assistant

## 2013-04-23 DIAGNOSIS — Z09 Encounter for follow-up examination after completed treatment for conditions other than malignant neoplasm: Secondary | ICD-10-CM

## 2013-04-23 DIAGNOSIS — C3430 Malignant neoplasm of lower lobe, unspecified bronchus or lung: Secondary | ICD-10-CM

## 2013-04-23 NOTE — Progress Notes (Signed)
[address]       [place] [address]             [phone]       CARDIAC SURGERY POSTOPERATIVE VISIT  Patient Name: Peggy French MRN: [id number] DOB: 1940/08/25  Subjective: Peggy French is a 72 y.o. female here for wound check of both incisional and chest tube wounds. She is s/p bronchoscopy, left VATS, superior segmentectomy of LLL by Dr. Tyrone Sage on 03/27/2013. She was instructed at her last appointment on 10/02 to cleanse both wounds daily and let open to air. She denies any fever, chills, drainage from wounds, or shortness of breath.  Past Medical History  Diagnosis Date  . Gout   . Diabetes mellitus     x over 10 yrs  . Hyperlipidemia   . GERD (gastroesophageal reflux disease)   . Chest pain   . H/O hiatal hernia   . Headache(784.0)     hx migraines  . Arthritis   . Sleep apnea     does not have CPAP machine  sleep study done on 05/19/2011  AHI during total sleep time (3h 53 min) 13.1/hr and REM sleep at 46.7/hr  . Cancer     LOV Dr Truett Perna 02/14/12 [facility]  . Asthmatic bronchitis 2011  . Hypertension     EKG 10/12 [facility], chest- 1 view  6/13 [facility]    Last 2D Echo on 05/02/2012 showed EF of greater than 55%  . Coronary artery disease   . Myocardial infarction     2001   Prior to Admission medications   Medication Sig Start Date End Date Taking? Authorizing Provider  acetaminophen (TYLENOL) 500 MG tablet Take 1,000 mg by mouth every 6 (six) hours as needed. For pain   Yes Historical Provider, MD  amLODipine (NORVASC) 5 MG tablet Take 5 mg by mouth daily as needed.    Yes Historical Provider, MD  esomeprazole (NEXIUM) 40 MG capsule Take 40 mg by mouth every evening. 07/16/11  Yes Malissa Hippo, MD  fexofenadine (ALLEGRA) 180 MG tablet Take 180 mg by mouth daily as needed. For Allergies   Yes Historical Provider, MD  gabapentin (NEURONTIN) 100 MG capsule Take 300 mg by mouth 4 (four) times daily.    Yes Historical Provider, MD    lidocaine-prilocaine (EMLA) cream Apply topically as needed. Apply to port-a-cath 1-2 hours prior to chemo.  Cover with plastic wrap 02/17/12  Yes [name], MD  linagliptin (TRADJENTA) 5 MG TABS tablet Take 5 mg by mouth daily after breakfast.    Yes Historical Provider, MD  propranolol-hydrochlorothiazide (INDERIDE) 40-25 MG per tablet Take 1 tablet by mouth daily with breakfast. 04/06/13  Yes Donielle Margaretann Loveless, PA-C  ramipril (ALTACE) 5 MG capsule Take 5 mg by mouth daily with breakfast.    Yes Historical Provider, MD  simvastatin (ZOCOR) 40 MG tablet Take 40 mg by mouth at bedtime.    Yes Historical Provider, MD  traMADol (ULTRAM) 50 MG tablet Take 1 tablet (50 mg total) by mouth every 8 (eight) hours as needed for pain. 03/31/13  Yes Donielle Margaretann Loveless, PA-C    Physical Exam:  Filed Vitals:   04/23/13 1422  BP: 134/77  Pulse: 77  Resp: 20    GENERAL: Well-nourished, well-developed, in no acute distress CARDIOVASCULAR: Regular rate and rhythm. No peripheral edema. RESPIRATORY: Respiratory effort is normal. Lungs clear to auscultation. ABDOMEN: Bowel sounds present. No masses or tenderness. WOUNDS: Clean  and dry. No erythema or drainage. Skin edges of main incision and chest tube site not completely healed yet, but no signs of infection.   Impression/Plan: Overall, her wounds are continuing to heal. I instructed her to continue with daily cleansing of wounds. She is not to use Neosporin and she is to let the wounds open to the air. Dr. Tyrone Sage has seen the wounds as well.She will return to see Dr. Tyrone Sage for a wound check in 2 weeks. If she develops fever, chills, drainage, or erythema, she is to contact us sooner. She has an appointment to see the oncologist in the morning.   Doree Fudge, PA-C 04/23/2013 2:42 PM

## 2013-04-24 ENCOUNTER — Other Ambulatory Visit (HOSPITAL_BASED_OUTPATIENT_CLINIC_OR_DEPARTMENT_OTHER): Payer: Medicare Other | Admitting: Lab

## 2013-04-24 ENCOUNTER — Encounter: Payer: Self-pay | Admitting: *Deleted

## 2013-04-24 ENCOUNTER — Ambulatory Visit (HOSPITAL_BASED_OUTPATIENT_CLINIC_OR_DEPARTMENT_OTHER): Payer: Medicare Other | Admitting: Oncology

## 2013-04-24 ENCOUNTER — Telehealth: Payer: Self-pay | Admitting: Oncology

## 2013-04-24 VITALS — BP 134/74 | HR 74 | Temp 97.9°F | Resp 20 | Ht 64.0 in | Wt 204.6 lb

## 2013-04-24 DIAGNOSIS — R110 Nausea: Secondary | ICD-10-CM | POA: Diagnosis not present

## 2013-04-24 DIAGNOSIS — C7800 Secondary malignant neoplasm of unspecified lung: Secondary | ICD-10-CM | POA: Diagnosis not present

## 2013-04-24 DIAGNOSIS — C180 Malignant neoplasm of cecum: Secondary | ICD-10-CM

## 2013-04-24 DIAGNOSIS — E119 Type 2 diabetes mellitus without complications: Secondary | ICD-10-CM

## 2013-04-24 DIAGNOSIS — C182 Malignant neoplasm of ascending colon: Secondary | ICD-10-CM

## 2013-04-24 LAB — CEA: CEA: 3.9 ng/mL (ref 0.0–5.0)

## 2013-04-24 MED ORDER — SODIUM CHLORIDE 0.9 % IJ SOLN
10.0000 mL | INTRAMUSCULAR | Status: DC | PRN
  Administered 2013-04-24: 10 mL via INTRAVENOUS
  Filled 2013-04-24: qty 10

## 2013-04-24 MED ORDER — HEPARIN SOD (PORK) LOCK FLUSH 100 UNIT/ML IV SOLN
500.0000 [IU] | Freq: Once | INTRAVENOUS | Status: AC
  Administered 2013-04-24: 500 [IU] via INTRAVENOUS
  Filled 2013-04-24: qty 5

## 2013-04-24 NOTE — Progress Notes (Signed)
[facility]    OFFICE PROGRESS NOTE   INTERVAL HISTORY:   Peggy French returns for scheduled followup of metastatic colon cancer. She underwent a wedge resection of the left lung nodule on 03/27/2013. The pathology ([id number]) confirmed metastatic adenocarcinoma of a colorectal primary. The resection margins were negative. A level 4L LYMPH node was negative.  She denies pain. The surgical wounds are healing. She continues to have neuropathy symptoms in the hands and feet. The numbness at the fingers has improved.  Objective:  Vital signs in last 24 hours:  Blood pressure 134/74, pulse 74, temperature 97.9 F (36.6 C), temperature source Oral, resp. rate 20, height 5\' 4"  (1.626 m), weight 204 lb 9.6 oz (92.806 kg).    HEENT: Neck without mass Lymphatics: No cervical, supraclavicular, or axillary nodes Resp: Lungs clear bilaterally Cardio: Regular rate and rhythm GI: No hepatomegaly, no mass, mild tenderness at the right mid abdominal scar Vascular: No leg edema  Skin: Healing surgical incisions at the left anterolateral chest without evidence of infection   Portacath/PICC-without erythema  Lab Results:  [name] pending  Medications: I have reviewed the patient's current medications.  Assessment/Plan: 1. 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 She began adjuvant CAPOX chemotherapy on 02/24/2012. She completed cycle 8 on 07/27/2012.Negative surveillance colonoscopy 12/08/2012 2. Delayed nausea following cycle 1 CAPOX. Aloxi was added beginning with cycle 2 with improvement. 3. Diabetes. 4. Microcytic anemia. Hemoglobin is normal. She will discontinue iron. 5. Remote history of a "colon tumor" removed endoscopically in 1980. 6. Left lower lung nodule noted on a CT 02/21/2012, slightly larger than on a CT 12/03/2011. Chest CT 06/12/2012 showed the previously seen 5 mm pulmonary nodule in left lower lobe was scarcely  visualized, estimated at 3 mm. No other suspicious pulmonary nodules were identified. Chest CT 11/27/2012 showed interval enlargement of the left lower lobe pulmonary nodule with a current measurement of 7 mm x 8 mm. No new pulmonary nodules. CT 02/22/2013 with enlargement of the anterior left lower lobe nodule and no other lung nodules. PET scan 03/02/2013 showed no associated hypermetabolism with the 12 mm left lower lobe pulmonary nodule. No hypermetabolic lymph nodes in the neck, chest, abdomen or pelvis. No abnormal hypermetabolic activity within the liver, pancreas, adrenal glands or spleen. Abnormal FDG accumulation in the posterior left nasopharyngeal mucosa.  Status post a wedge resection of the left lower lobe nodule on 03/27/2013 with the pathology confirming metastatic adenocarcinoma of colorectal primary 7. Status post Port-A-Cath placement 02/22/2012. Status post Port-A-Cath repositioning 03/14/2012. 8. Probable acute laryngopharyngeal dysesthesia following cycle 2 CAPOX. Symptoms resolved with warming.  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 She reports improvement in the neuropathy symptoms today. 10. History of anterior chest pain-she reports undergoing an evaluation by Dr. Rennis Golden. 11. 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 12. [name] at the upper end of the normal range on 07/27/2012; 3.8 on 09/07/2012; 3.4 on 10/19/2012. Increased at 11.9 on 02/19/2013. 13. PET scan 03/02/2013 showed no associated hypermetabolism with the 12 mm left lower lobe pulmonary nodule. No hypermetabolic lymph nodes in the neck, chest, abdomen or pelvis. No abnormal hypermetabolic activity within the liver, pancreas, adrenal glands or spleen. Abnormal FDG accumulation in the posterior left nasopharyngeal mucosa.   Disposition:  Peggy French has been diagnosed with metastatic colon cancer involving a left lung nodule. She is status post wedge resection  of the left lung nodule on 03/27/2013. I  discussed the diagnosis and treatment options with Peggy French today. She was treated with CAPOX in the adjuvant setting. There is no evidence of a benefit from additional chemotherapy at present. I do not recommend "adjuvant "chemotherapy following the lung resection.  We will followup on the [name] today. Ms. Salaz return for an office visit and [name] in 6 weeks. We will plan for a restaging CT of the chest in 3-4 months.  She will meet with the research nurse today to discuss enrollment on the [employer] colon cancer tissue bank study.   Thornton Papas, MD  04/24/2013  12:57 PM

## 2013-04-24 NOTE — Telephone Encounter (Signed)
gv and printed appt sched and avs for pt for NOV °

## 2013-04-25 ENCOUNTER — Telehealth: Payer: Self-pay | Admitting: *Deleted

## 2013-04-25 NOTE — Telephone Encounter (Signed)
Notified of normal [name]. 

## 2013-04-25 NOTE — Telephone Encounter (Signed)
Message copied by Wandalee Ferdinand on Wed Apr 25, 2013  4:47 PM ------      Message from: Peggy French      Created: Wed Apr 25, 2013  4:43 PM       Please call patient, cea is normal ------

## 2013-05-07 ENCOUNTER — Other Ambulatory Visit: Payer: Self-pay | Admitting: *Deleted

## 2013-05-07 DIAGNOSIS — R918 Other nonspecific abnormal finding of lung field: Secondary | ICD-10-CM

## 2013-05-10 ENCOUNTER — Ambulatory Visit
Admission: RE | Admit: 2013-05-10 | Discharge: 2013-05-10 | Disposition: A | Discharge status: Home / Self Care | Payer: Medicare Other | Source: Ambulatory Visit | Attending: Cardiothoracic Surgery | Admitting: Cardiothoracic Surgery

## 2013-05-10 ENCOUNTER — Encounter: Payer: Self-pay | Admitting: Cardiothoracic Surgery

## 2013-05-10 ENCOUNTER — Ambulatory Visit (INDEPENDENT_AMBULATORY_CARE_PROVIDER_SITE_OTHER): Payer: Self-pay | Admitting: Cardiothoracic Surgery

## 2013-05-10 DIAGNOSIS — C3430 Malignant neoplasm of lower lobe, unspecified bronchus or lung: Secondary | ICD-10-CM

## 2013-05-10 DIAGNOSIS — J984 Other disorders of lung: Secondary | ICD-10-CM | POA: Diagnosis not present

## 2013-05-10 DIAGNOSIS — Z09 Encounter for follow-up examination after completed treatment for conditions other than malignant neoplasm: Secondary | ICD-10-CM

## 2013-05-10 DIAGNOSIS — R918 Other nonspecific abnormal finding of lung field: Secondary | ICD-10-CM

## 2013-05-10 NOTE — Progress Notes (Signed)
[address]       [place] [address]             [phone]                  Peggy French [facility] Medical Record #[id number] Date of Birth: [birth year]  Peggy Artist, MD Peggy Courier, MD  Chief Complaint:   PostOp Follow Up Visit 03/27/2013  PREOPERATIVE DIAGNOSIS: New left lung mass.  POSTOPERATIVE DIAGNOSIS: New left lung mass, adenocarcinoma by frozen  section, question colon metastasis versus primary tumor.  PROCEDURE PERFORMED: Bronchoscopy, left video-assisted thoracoscopy,  wedge resection, superior segment left lower lobe, and lymph node  dissection.   PATH: 1. Lung, wedge biopsy/resection, Inferior segment of left lower lobe - METASTATIC ADENOCARCINOMA OF COLORECTAL PRIMARY SOURCE, 1.7 CM. - MARGIN IS NEGATIVE. - SEE COMMENT. 2. Lymph node, biopsy, 4 L - ONE BENIGN LYMPH NODE WITH NO TUMOR SEEN (0/1).   History of Present Illness:      Patient doing well postoperatively, she has returned to near normal activities. She reports that her recent blood tests or "good" and that she will not need chemotherapy currently.      History  Smoking status  . Never Smoker   Smokeless tobacco  . Never Used       Allergies  Allergen Reactions  . Aspirin Palpitations    Current Outpatient Prescriptions  Medication Sig Dispense Refill  . acetaminophen (TYLENOL) 500 MG tablet Take 1,000 mg by mouth every 6 (six) hours as needed. For pain      . amLODipine (NORVASC) 5 MG tablet Take 5 mg by mouth daily as needed.       Marland Kitchen esomeprazole (NEXIUM) 40 MG capsule Take 40 mg by mouth every evening.      . fexofenadine (ALLEGRA) 180 MG tablet Take 180 mg by mouth daily as needed. For Allergies      . gabapentin (NEURONTIN) 100 MG capsule Take 300 mg by mouth 4 (four) times daily.       Marland Kitchen lidocaine-prilocaine (EMLA) cream Apply topically as needed. Apply to port-a-cath 1-2 hours prior to chemo.  Cover with plastic wrap  30 g  1  .  linagliptin (TRADJENTA) 5 MG TABS tablet Take 5 mg by mouth daily after breakfast.       . propranolol-hydrochlorothiazide (INDERIDE) 40-25 MG per tablet Take 1 tablet by mouth daily with breakfast.      . ramipril (ALTACE) 5 MG capsule Take 5 mg by mouth daily with breakfast.       . simvastatin (ZOCOR) 40 MG tablet Take 40 mg by mouth at bedtime.       . traMADol (ULTRAM) 50 MG tablet Take 1 tablet (50 mg total) by mouth every 8 (eight) hours as needed for pain.  30 tablet  0   No current facility-administered medications for this visit.   Facility-Administered Medications Ordered in Other Visits  Medication Dose Route Frequency Provider Last Rate Last Dose  . sodium chloride 0.9 % injection 10 mL  10 mL Intracatheter PRN Peggy Artist, MD           Physical Exam: BP 135/75  Pulse 75  Resp 18  Ht 5\' 4"  (1.626 m)  Wt 204 lb (92.534 kg)  BMI 35 kg/m2  SpO2 96%  General appearance: alert and cooperative Neurologic: intact Heart: regular rate and rhythm, S1, S2 normal, no murmur, click, rub or gallop Lungs: clear  to auscultation bilaterally and normal percussion bilaterally Abdomen: soft, non-tender; bowel sounds normal; no masses,  no organomegaly Extremities: extremities normal, atraumatic, no cyanosis or edema and Homans sign is negative, no sign of DVT Wound: Now well healed, with very small area of "proud flesh" at the chest tube site without evidence of infection   Diagnostic Studies & Laboratory data:         Recent Radiology Findings: Dg Chest 2 View  05/10/2013   CLINICAL DATA:  Status post partial left lung resection.  EXAM: CHEST  2 VIEW  COMPARISON:  04/12/2013.  FINDINGS: Of the Port-A-Cath is stable. The cardiac silhouette, mediastinal and hilar contours are within normal limits and unchanged. There is persistent left basilar atelectasis or scarring change. The left pleural effusion has resolved. No pulmonary edema or pneumothorax.  IMPRESSION: Probable  postoperative scarring changes involving the left lower lobe  Resolution of left pleural effusion.   Electronically Signed   By: Loralie Champagne M.D.   On: 05/10/2013 14:12      Recent Labs: Lab Results  Component Value Date   WBC 8.6 03/30/2013   HGB 11.5* 03/30/2013   HCT 34.7* 03/30/2013   PLT 146* 03/30/2013   GLUCOSE 104* 03/30/2013   CHOL 163 04/02/2011   TRIG 57 04/02/2011   HDL 60 04/02/2011   LDLCALC 92 04/02/2011   ALT 16 03/29/2013   AST 26 03/29/2013   NA 140 03/30/2013   K 3.5 03/30/2013   CL 101 03/30/2013   CREATININE 0.80 03/30/2013   BUN 14 03/30/2013   CO2 32 03/30/2013   TSH 1.908 04/01/2011   INR 0.96 03/23/2013   HGBA1C 6.5* 04/01/2011      Assessment / Plan:      Stable progress following wedge resection of left lung lesion which was determined to be an isolated nodule of metastatic adenocarcinoma from the colon. Patient's postop chest x-ray is improved  Plan to see her back in in 3 months with followup chest x-ray   Peggy French B 05/10/2013 2:43 PM

## 2013-05-25 DIAGNOSIS — C189 Malignant neoplasm of colon, unspecified: Secondary | ICD-10-CM | POA: Diagnosis not present

## 2013-05-25 DIAGNOSIS — E119 Type 2 diabetes mellitus without complications: Secondary | ICD-10-CM | POA: Diagnosis not present

## 2013-05-25 DIAGNOSIS — I10 Essential (primary) hypertension: Secondary | ICD-10-CM | POA: Diagnosis not present

## 2013-06-05 ENCOUNTER — Ambulatory Visit (HOSPITAL_BASED_OUTPATIENT_CLINIC_OR_DEPARTMENT_OTHER): Payer: Medicare Other | Admitting: Nurse Practitioner

## 2013-06-05 ENCOUNTER — Ambulatory Visit (HOSPITAL_BASED_OUTPATIENT_CLINIC_OR_DEPARTMENT_OTHER): Payer: Medicare Other

## 2013-06-05 ENCOUNTER — Other Ambulatory Visit: Payer: Medicare Other | Admitting: Lab

## 2013-06-05 ENCOUNTER — Telehealth: Payer: Self-pay | Admitting: Oncology

## 2013-06-05 VITALS — BP 144/87 | HR 89 | Temp 98.3°F | Resp 20 | Ht 64.0 in | Wt 208.1 lb

## 2013-06-05 DIAGNOSIS — Z85038 Personal history of other malignant neoplasm of large intestine: Secondary | ICD-10-CM | POA: Diagnosis not present

## 2013-06-05 DIAGNOSIS — C189 Malignant neoplasm of colon, unspecified: Secondary | ICD-10-CM

## 2013-06-05 DIAGNOSIS — R970 Elevated carcinoembryonic antigen [CEA]: Secondary | ICD-10-CM

## 2013-06-05 DIAGNOSIS — C182 Malignant neoplasm of ascending colon: Secondary | ICD-10-CM | POA: Diagnosis not present

## 2013-06-05 DIAGNOSIS — R948 Abnormal results of function studies of other organs and systems: Secondary | ICD-10-CM | POA: Diagnosis not present

## 2013-06-05 MED ORDER — HEPARIN SOD (PORK) LOCK FLUSH 100 UNIT/ML IV SOLN
500.0000 [IU] | Freq: Once | INTRAVENOUS | Status: AC
  Administered 2013-06-05: 500 [IU] via INTRAVENOUS
  Filled 2013-06-05: qty 5

## 2013-06-05 MED ORDER — SODIUM CHLORIDE 0.9 % IJ SOLN
10.0000 mL | INTRAMUSCULAR | Status: DC | PRN
  Administered 2013-06-05: 10 mL via INTRAVENOUS
  Filled 2013-06-05: qty 10

## 2013-06-05 NOTE — Patient Instructions (Signed)
Implanted Port Instructions  An implanted port is a central line that has a round shape and is placed under the skin. It is used for long-term IV (intravenous) access for:  · Medicine.  · Fluids.  · Liquid nutrition, such as TPN (total parenteral nutrition).  · Blood samples.  Ports can be placed:  · In the chest area just below the collarbone (this is the most common place.)  · In the arms.  · In the belly (abdomen) area.  · In the legs.  PARTS OF THE PORT  A port has 2 main parts:  · The reservoir. The reservoir is round, disc-shaped, and will be a small, raised area under your skin.  · The reservoir is the part where a needle is inserted (accessed) to either give medicines or to draw blood.  · The catheter. The catheter is a long, slender tube that extends from the reservoir. The catheter is placed into a large vein.  · Medicine that is inserted into the reservoir goes into the catheter and then into the vein.  INSERTION OF THE PORT  · The port is surgically placed in either an operating room or in a procedural area (interventional radiology).  · Medicine may be given to help you relax during the procedure.  · The skin where the port will be inserted is numbed (local anesthetic).  · 1 or 2 small cuts (incisions) will be made in the skin to insert the port.  · The port can be used after it has been inserted.  INCISION SITE CARE  · The incision site may have small adhesive strips on it. This helps keep the incision site closed. Sometimes, no adhesive strips are placed. Instead of adhesive strips, a special kind of surgical glue is used to keep the incision closed.  · If adhesive strips were placed on the incision sites, do not take them off. They will fall off on their own.  · The incision site may be sore for 1 to 2 days. Pain medicine can help.  · Do not get the incision site wet. Bathe or shower as directed by your caregiver.  · The incision site should heal in 5 to 7 days. A small scar may form after the  incision has healed.  ACCESSING THE PORT  Special steps must be taken to access the port:  · Before the port is accessed, a numbing cream can be placed on the skin. This helps numb the skin over the port site.  · A sterile technique is used to access the port.  · The port is accessed with a needle. Only "non-coring" port needles should be used to access the port. Once the port is accessed, a blood return should be checked. This helps ensure the port is in the vein and is not clogged (clotted).  · If your caregiver believes your port should remain accessed, a clear (transparent) bandage will be placed over the needle site. The bandage and needle will need to be changed every week or as directed by your caregiver.  · Keep the bandage covering the needle clean and dry. Do not get it wet. Follow your caregiver's instructions on how to take a shower or bath when the port is accessed.  · If your port does not need to stay accessed, no bandage is needed over the port.  FLUSHING THE PORT  Flushing the port keeps it from getting clogged. How often the port is flushed depends on:  · If a   constant infusion is running. If a constant infusion is running, the port may not need to be flushed.  · If intermittent medicines are given.  · If the port is not being used.  For intermittent medicines:  · The port will need to be flushed:  · After medicines have been given.  · After blood has been drawn.  · As part of routine maintenance.  · A port is normally flushed with:  · Normal saline.  · Heparin.  · Follow your caregiver's advice on how often, how much, and the type of flush to use on your port.  IMPORTANT PORT INFORMATION  · Tell your caregiver if you are allergic to heparin.  · After your port is placed, you will get a manufacturer's information card. The card has information about your port. Keep this card with you at all times.  · There are many types of ports available. Know what kind of port you have.  · In case of an  emergency, it may be helpful to wear a medical alert bracelet. This can help alert health care workers that you have a port.  · The port can stay in for as long as your caregiver believes it is necessary.  · When it is time for the port to come out, surgery will be done to remove it. The surgery will be similar to how the port was put in.  · If you are in the hospital or clinic:  · Your port will be taken care of and flushed by a nurse.  · If you are at home:  · A home health care nurse may give medicines and take care of the port.  · You or a family member can get special training and directions for giving medicine and taking care of the port at home.  SEEK IMMEDIATE MEDICAL CARE IF:   · Your port does not flush or you are unable to get a blood return.  · New drainage or pus is coming from the incision.  · A bad smell is coming from the incision site.  · You develop swelling or increased redness at the incision site.  · You develop increased swelling or pain at the port site.  · You develop swelling or pain in the surrounding skin near the port.  · You have an oral temperature above 102° F (38.9° C), not controlled by medicine.  MAKE SURE YOU:   · Understand these instructions.  · Will watch your condition.  · Will get help right away if you are not doing well or get worse.  Document Released: 06/28/2005 Document Revised: 09/20/2011 Document Reviewed: 09/19/2008  [employer]® Patient Information ©2014 [employer].

## 2013-06-05 NOTE — Telephone Encounter (Signed)
gv and printed papt sched and avs for pt for Jan 2015

## 2013-06-05 NOTE — Progress Notes (Signed)
OFFICE PROGRESS NOTE  Interval history:  Peggy French returns for followup of metastatic colon cancer. Overall she feels well. She continues to have intermittent pain at the left chest wall. She takes Ultram periodically. She denies nausea/vomiting. Bowels moving regularly. No rectal bleeding. She continues to have neuropathy symptoms in the feet. She has a good appetite and overall good energy level. She notes mild shortness of breath with exertion. No cough or fever.   Objective: Blood pressure 144/87, pulse 89, temperature 98.3 F (36.8 C), temperature source Oral, resp. rate 20, height 5\' 4"  (1.626 m), weight 208 lb 1.6 oz (94.394 kg).  Oropharynx is without thrush or ulceration. No palpable cervical, supraclavicular, axillary or inguinal lymph nodes. Lungs are clear. No wheezes or rales. Regular cardiac rhythm. Port-A-Cath site is without erythema. Abdomen is soft and nontender. No hepatomegaly. No leg edema. Well-healed scar at the left anterolateral chest.  Lab Results: Lab Results  Component Value Date   WBC 8.6 03/30/2013   HGB 11.5* 03/30/2013   HCT 34.7* 03/30/2013   MCV 79.8 03/30/2013   PLT 146* 03/30/2013    Chemistry:    Chemistry      Component Value Date/Time   NA 140 03/30/2013 0500   NA 138 11/27/2012 0807   K 3.5 03/30/2013 0500   K 3.7 11/27/2012 0807   CL 101 03/30/2013 0500   CL 101 11/27/2012 0807   CO2 32 03/30/2013 0500   CO2 25 11/27/2012 0807   BUN 14 03/30/2013 0500   BUN 14.8 11/27/2012 0807   CREATININE 0.80 03/30/2013 0500   CREATININE 0.9 11/27/2012 0807   CREATININE 0.91 12/02/2011 1159      Component Value Date/Time   CALCIUM 8.4 03/30/2013 0500   CALCIUM 9.4 11/27/2012 0807   ALKPHOS 107 03/29/2013 0345   ALKPHOS 117 11/27/2012 0807   AST 26 03/29/2013 0345   AST 29 11/27/2012 0807   ALT 16 03/29/2013 0345   ALT 15 11/27/2012 0807   BILITOT 0.5 03/29/2013 0345   BILITOT 0.40 11/27/2012 0807       Studies/Results: Dg Chest 2 View  05/10/2013    CLINICAL DATA:  Status post partial left lung resection.  EXAM: CHEST  2 VIEW  COMPARISON:  04/12/2013.  FINDINGS: Of the Port-A-Cath is stable. The cardiac silhouette, mediastinal and hilar contours are within normal limits and unchanged. There is persistent left basilar atelectasis or scarring change. The left pleural effusion has resolved. No pulmonary edema or pneumothorax.  IMPRESSION: Probable postoperative scarring changes involving the left lower lobe  Resolution of left pleural effusion.   Electronically Signed   By: Loralie Champagne M.D.   On: 05/10/2013 14:12    Medications: I have reviewed the patient's current medications.  Assessment/Plan:  1. 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 She began adjuvant CAPOX chemotherapy on 02/24/2012. She completed cycle 8 on 07/27/2012. Negative surveillance colonoscopy 12/08/2012 2. Delayed nausea following cycle 1 CAPOX. Aloxi was added beginning with cycle 2 with improvement. 3. Diabetes. 4. Microcytic anemia. Hemoglobin is normal. She will discontinue iron. 5. Remote history of a "colon tumor" removed endoscopically in 1980. 6. Left lower lung nodule noted on a CT 02/21/2012, slightly larger than on a CT 12/03/2011. Chest CT 06/12/2012 showed the previously seen 5 mm pulmonary nodule in left lower lobe was scarcely visualized, estimated at 3 mm. No other suspicious pulmonary nodules were identified. Chest CT 11/27/2012 showed interval enlargement of the left lower lobe pulmonary nodule  with a current measurement of 7 mm x 8 mm. No new pulmonary nodules. CT 02/22/2013 with enlargement of the anterior left lower lobe nodule and no other lung nodules. PET scan 03/02/2013 showed no associated hypermetabolism with the 12 mm left lower lobe pulmonary nodule. No hypermetabolic lymph nodes in the neck, chest, abdomen or pelvis. No abnormal hypermetabolic activity within the liver, pancreas, adrenal glands or  spleen. Abnormal FDG accumulation in the posterior left nasopharyngeal mucosa. Status post a wedge resection of the left lower lobe nodule on 03/27/2013 with the pathology confirming metastatic adenocarcinoma of colorectal primary. 7. Status post Port-A-Cath placement 02/22/2012. Status post Port-A-Cath repositioning 03/14/2012. 8. Probable acute laryngopharyngeal dysesthesia following cycle 2 CAPOX. Symptoms resolved with warming.  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 She has persistent neuropathy symptoms involving the feet. 10. History of anterior chest pain-she reports undergoing an evaluation by Dr. Rennis Golden. 11. 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 12. [name] at the upper end of the normal range on 07/27/2012; 3.8 on 09/07/2012; 3.4 on 10/19/2012. Increased at 11.9 on 02/19/2013. [name] normal at 3.9 on 04/24/2013. 13. PET scan 03/02/2013 showed no associated hypermetabolism with the 12 mm left lower lobe pulmonary nodule. No hypermetabolic lymph nodes in the neck, chest, abdomen or pelvis. No abnormal hypermetabolic activity within the liver, pancreas, adrenal glands or spleen. Abnormal FDG accumulation in the posterior left nasopharyngeal mucosa.  Disposition-she appears stable. She remains in clinical remission from metastatic colon cancer. We will followup on the [name] from today. Dr. Truett Perna recommends a followup noncontrast chest CT in approximately 6 weeks. We will see her a few days after the scan to review the results. She will contact the office in the interim with any problems.  Plan reviewed with Dr. Truett Perna.  Lonna Cobb ANP/GNP-BC

## 2013-06-06 LAB — CEA: CEA: 2.9 ng/mL (ref 0.0–5.0)

## 2013-06-08 ENCOUNTER — Telehealth: Payer: Self-pay | Admitting: *Deleted

## 2013-06-08 NOTE — Telephone Encounter (Signed)
Called and informed patient of normal [name].  Per Dr. Sherrill.  Patient verbalized understanding. 

## 2013-06-08 NOTE — Telephone Encounter (Signed)
Message copied by Raphael Gibney on Fri Jun 08, 2013 10:31 AM ------      Message from: Wandalee Ferdinand      Created: Fri Jun 08, 2013  9:11 AM                   ----- Message -----         From: [name], MD         Sent: 06/06/2013  11:14 PM           To: Wandalee Ferdinand, RN, Glori Luis, RN, #            Please call patient, cea is normal ------

## 2013-07-17 ENCOUNTER — Other Ambulatory Visit: Payer: Medicare Other | Admitting: Lab

## 2013-07-18 ENCOUNTER — Ambulatory Visit (HOSPITAL_BASED_OUTPATIENT_CLINIC_OR_DEPARTMENT_OTHER): Payer: Medicare Other

## 2013-07-18 ENCOUNTER — Other Ambulatory Visit (HOSPITAL_BASED_OUTPATIENT_CLINIC_OR_DEPARTMENT_OTHER): Payer: Medicare Other

## 2013-07-18 ENCOUNTER — Ambulatory Visit (HOSPITAL_COMMUNITY)
Admission: RE | Admit: 2013-07-18 | Discharge: 2013-07-18 | Disposition: A | Discharge status: Home / Self Care | Payer: Medicare Other | Source: Ambulatory Visit | Attending: Nurse Practitioner | Admitting: Nurse Practitioner

## 2013-07-18 VITALS — BP 141/65 | HR 76 | Temp 97.8°F

## 2013-07-18 DIAGNOSIS — C7800 Secondary malignant neoplasm of unspecified lung: Secondary | ICD-10-CM

## 2013-07-18 DIAGNOSIS — I2510 Atherosclerotic heart disease of native coronary artery without angina pectoris: Secondary | ICD-10-CM | POA: Diagnosis not present

## 2013-07-18 DIAGNOSIS — R911 Solitary pulmonary nodule: Secondary | ICD-10-CM | POA: Diagnosis not present

## 2013-07-18 DIAGNOSIS — C189 Malignant neoplasm of colon, unspecified: Secondary | ICD-10-CM | POA: Insufficient documentation

## 2013-07-18 DIAGNOSIS — C180 Malignant neoplasm of cecum: Secondary | ICD-10-CM

## 2013-07-18 LAB — COMPREHENSIVE METABOLIC PANEL (CC13)
ALT: 10 U/L (ref 0–55)
AST: 19 U/L (ref 5–34)
Albumin: 3.2 g/dL — ABNORMAL LOW (ref 3.5–5.0)
Alkaline Phosphatase: 126 U/L (ref 40–150)
Anion Gap: 8 mEq/L (ref 3–11)
BUN: 12.2 mg/dL (ref 7.0–26.0)
CALCIUM: 9 mg/dL (ref 8.4–10.4)
CHLORIDE: 102 meq/L (ref 98–109)
CO2: 29 mEq/L (ref 22–29)
Creatinine: 1 mg/dL (ref 0.6–1.1)
Glucose: 132 mg/dl (ref 70–140)
Potassium: 3.7 mEq/L (ref 3.5–5.1)
Sodium: 139 mEq/L (ref 136–145)
Total Bilirubin: 0.3 mg/dL (ref 0.20–1.20)
Total Protein: 7.6 g/dL (ref 6.4–8.3)

## 2013-07-18 LAB — CBC WITH DIFFERENTIAL/PLATELET
BASO%: 0.8 % (ref 0.0–2.0)
Basophils Absolute: 0 10*3/uL (ref 0.0–0.1)
EOS%: 5.1 % (ref 0.0–7.0)
Eosinophils Absolute: 0.3 10*3/uL (ref 0.0–0.5)
HEMATOCRIT: 36.8 % (ref 34.8–46.6)
HEMOGLOBIN: 12.2 g/dL (ref 11.6–15.9)
LYMPH#: 1.4 10*3/uL (ref 0.9–3.3)
LYMPH%: 27.1 % (ref 14.0–49.7)
MCH: 26.7 pg (ref 25.1–34.0)
MCHC: 33 g/dL (ref 31.5–36.0)
MCV: 81 fL (ref 79.5–101.0)
MONO#: 0.6 10*3/uL (ref 0.1–0.9)
MONO%: 11.6 % (ref 0.0–14.0)
NEUT%: 55.4 % (ref 38.4–76.8)
NEUTROS ABS: 2.8 10*3/uL (ref 1.5–6.5)
Platelets: 208 10*3/uL (ref 145–400)
RBC: 4.55 10*6/uL (ref 3.70–5.45)
RDW: 14.5 % (ref 11.2–14.5)
WBC: 5.1 10*3/uL (ref 3.9–10.3)

## 2013-07-18 MED ORDER — SODIUM CHLORIDE 0.9 % IJ SOLN
10.0000 mL | INTRAMUSCULAR | Status: DC | PRN
  Administered 2013-07-18: 10 mL via INTRAVENOUS
  Filled 2013-07-18: qty 10

## 2013-07-18 MED ORDER — HEPARIN SOD (PORK) LOCK FLUSH 100 UNIT/ML IV SOLN
500.0000 [IU] | Freq: Once | INTRAVENOUS | Status: AC
  Administered 2013-07-18: 500 [IU] via INTRAVENOUS
  Filled 2013-07-18: qty 5

## 2013-07-18 NOTE — Patient Instructions (Signed)
Implanted Port Instructions  An implanted port is a central line that has a round shape and is placed under the skin. It is used for long-term IV (intravenous) access for:  · Medicine.  · Fluids.  · Liquid nutrition, such as TPN (total parenteral nutrition).  · Blood samples.  Ports can be placed:  · In the chest area just below the collarbone (this is the most common place.)  · In the arms.  · In the belly (abdomen) area.  · In the legs.  PARTS OF THE PORT  A port has 2 main parts:  · The reservoir. The reservoir is round, disc-shaped, and will be a small, raised area under your skin.  · The reservoir is the part where a needle is inserted (accessed) to either give medicines or to draw blood.  · The catheter. The catheter is a long, slender tube that extends from the reservoir. The catheter is placed into a large vein.  · Medicine that is inserted into the reservoir goes into the catheter and then into the vein.  INSERTION OF THE PORT  · The port is surgically placed in either an operating room or in a procedural area (interventional radiology).  · Medicine may be given to help you relax during the procedure.  · The skin where the port will be inserted is numbed (local anesthetic).  · 1 or 2 small cuts (incisions) will be made in the skin to insert the port.  · The port can be used after it has been inserted.  INCISION SITE CARE  · The incision site may have small adhesive strips on it. This helps keep the incision site closed. Sometimes, no adhesive strips are placed. Instead of adhesive strips, a special kind of surgical glue is used to keep the incision closed.  · If adhesive strips were placed on the incision sites, do not take them off. They will fall off on their own.  · The incision site may be sore for 1 to 2 days. Pain medicine can help.  · Do not get the incision site wet. Bathe or shower as directed by your caregiver.  · The incision site should heal in 5 to 7 days. A small scar may form after the  incision has healed.  ACCESSING THE PORT  Special steps must be taken to access the port:  · Before the port is accessed, a numbing cream can be placed on the skin. This helps numb the skin over the port site.  · A sterile technique is used to access the port.  · The port is accessed with a needle. Only "non-coring" port needles should be used to access the port. Once the port is accessed, a blood return should be checked. This helps ensure the port is in the vein and is not clogged (clotted).  · If your caregiver believes your port should remain accessed, a clear (transparent) bandage will be placed over the needle site. The bandage and needle will need to be changed every week or as directed by your caregiver.  · Keep the bandage covering the needle clean and dry. Do not get it wet. Follow your caregiver's instructions on how to take a shower or bath when the port is accessed.  · If your port does not need to stay accessed, no bandage is needed over the port.  FLUSHING THE PORT  Flushing the port keeps it from getting clogged. How often the port is flushed depends on:  · If a   constant infusion is running. If a constant infusion is running, the port may not need to be flushed.  · If intermittent medicines are given.  · If the port is not being used.  For intermittent medicines:  · The port will need to be flushed:  · After medicines have been given.  · After blood has been drawn.  · As part of routine maintenance.  · A port is normally flushed with:  · Normal saline.  · Heparin.  · Follow your caregiver's advice on how often, how much, and the type of flush to use on your port.  IMPORTANT PORT INFORMATION  · Tell your caregiver if you are allergic to heparin.  · After your port is placed, you will get a manufacturer's information card. The card has information about your port. Keep this card with you at all times.  · There are many types of ports available. Know what kind of port you have.  · In case of an  emergency, it may be helpful to wear a medical alert bracelet. This can help alert health care workers that you have a port.  · The port can stay in for as long as your caregiver believes it is necessary.  · When it is time for the port to come out, surgery will be done to remove it. The surgery will be similar to how the port was put in.  · If you are in the hospital or clinic:  · Your port will be taken care of and flushed by a nurse.  · If you are at home:  · A home health care nurse may give medicines and take care of the port.  · You or a family member can get special training and directions for giving medicine and taking care of the port at home.  SEEK IMMEDIATE MEDICAL CARE IF:   · Your port does not flush or you are unable to get a blood return.  · New drainage or pus is coming from the incision.  · A bad smell is coming from the incision site.  · You develop swelling or increased redness at the incision site.  · You develop increased swelling or pain at the port site.  · You develop swelling or pain in the surrounding skin near the port.  · You have an oral temperature above 102° F (38.9° C), not controlled by medicine.  MAKE SURE YOU:   · Understand these instructions.  · Will watch your condition.  · Will get help right away if you are not doing well or get worse.  Document Released: 06/28/2005 Document Revised: 09/20/2011 Document Reviewed: 09/19/2008  [employer]® Patient Information ©2014 [employer].

## 2013-07-19 LAB — CEA: CEA: 3.4 ng/mL (ref 0.0–5.0)

## 2013-07-20 ENCOUNTER — Ambulatory Visit (HOSPITAL_BASED_OUTPATIENT_CLINIC_OR_DEPARTMENT_OTHER): Payer: Medicare Other | Admitting: Oncology

## 2013-07-20 ENCOUNTER — Telehealth: Payer: Self-pay | Admitting: Oncology

## 2013-07-20 VITALS — BP 148/82 | HR 81 | Temp 97.9°F | Resp 19 | Ht 64.0 in | Wt 207.3 lb

## 2013-07-20 DIAGNOSIS — T451X5A Adverse effect of antineoplastic and immunosuppressive drugs, initial encounter: Secondary | ICD-10-CM | POA: Diagnosis not present

## 2013-07-20 DIAGNOSIS — L270 Generalized skin eruption due to drugs and medicaments taken internally: Secondary | ICD-10-CM

## 2013-07-20 DIAGNOSIS — G622 Polyneuropathy due to other toxic agents: Secondary | ICD-10-CM

## 2013-07-20 DIAGNOSIS — C180 Malignant neoplasm of cecum: Secondary | ICD-10-CM

## 2013-07-20 DIAGNOSIS — D509 Iron deficiency anemia, unspecified: Secondary | ICD-10-CM | POA: Diagnosis not present

## 2013-07-20 DIAGNOSIS — R110 Nausea: Secondary | ICD-10-CM | POA: Diagnosis not present

## 2013-07-20 DIAGNOSIS — C182 Malignant neoplasm of ascending colon: Secondary | ICD-10-CM

## 2013-07-20 NOTE — Telephone Encounter (Signed)
gv and printed appt sched and avs for ptfor Feb and March

## 2013-07-20 NOTE — Progress Notes (Signed)
[facility]    OFFICE PROGRESS NOTE   INTERVAL HISTORY:   Peggy French returns as scheduled. She feels well. She continues to have numbness in the feet. She takes Neurontin 4 times a day. She believes the Neurontin may be causing dizziness and somnolence.  Objective:  Vital signs in last 24 hours:  Blood pressure 148/82, pulse 81, temperature 97.9 F (36.6 C), temperature source Oral, resp. rate 19, height 5\' 4"  (1.626 m), weight 207 lb 4.8 oz (94.031 kg).    HEENT: Neck without mass Lymphatics: No cervical, supraclavicular, axillary, or inguinal nodes Resp: Lungs clear bilaterally Cardio: Regular rate and rhythm GI: No hepatomegaly, no mass Vascular: No leg edema    Portacath/PICC-without erythema  Lab Results:  Lab Results  Component Value Date   WBC 5.1 07/18/2013   HGB 12.2 07/18/2013   HCT 36.8 07/18/2013   MCV 81.0 07/18/2013   PLT 208 07/18/2013   NEUTROABS 2.8 07/18/2013   [name] 3.4  X-rays: CT the chest 07/18/2013, compared 02/22/2013: Interval resection of the left lower lobe pulmonary nodule. No new or progressive disease in the chest   Medications: I have reviewed the patient's current medications.  Assessment/Plan: 1. 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 She began adjuvant CAPOX chemotherapy on 02/24/2012. She completed cycle 8 on 07/27/2012. Negative surveillance colonoscopy 12/08/2012 2. Delayed nausea following cycle 1 CAPOX. Aloxi was added beginning with cycle 2 with improvement. 3. Diabetes. 4. Microcytic anemia. Hemoglobin is normal. She will discontinue iron. 5. Remote history of a "colon tumor" removed endoscopically in 1980. 6. Left lower lung nodule noted on a CT 02/21/2012, slightly larger than on a CT 12/03/2011. Chest CT 06/12/2012 showed the previously seen 5 mm pulmonary nodule in left lower lobe was scarcely visualized, estimated at 3 mm. No other suspicious pulmonary nodules  were identified. Chest CT 11/27/2012 showed interval enlargement of the left lower lobe pulmonary nodule with a current measurement of 7 mm x 8 mm. No new pulmonary nodules. CT 02/22/2013 with enlargement of the anterior left lower lobe nodule and no other lung nodules. PET scan 03/02/2013 showed no associated hypermetabolism with the 12 mm left lower lobe pulmonary nodule. No hypermetabolic lymph nodes in the neck, chest, abdomen or pelvis. No abnormal hypermetabolic activity within the liver, pancreas, adrenal glands or spleen. Abnormal FDG accumulation in the posterior left nasopharyngeal mucosa. Status post a wedge resection of the left lower lobe nodule on 03/27/2013 with the pathology confirming metastatic adenocarcinoma of colorectal primary. Chest CT 07/18/2013-negative for recurrent disease 7. Status post Port-A-Cath placement 02/22/2012. Status post Port-A-Cath repositioning 03/14/2012. 8. Probable acute laryngopharyngeal dysesthesia following cycle 2 CAPOX. Symptoms resolved with warming.  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 She has persistent neuropathy symptoms involving the feet. 10. History of anterior chest pain-she reports undergoing an evaluation by Dr. Debara Pickett. 11. 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 12. [name] at the upper end of the normal range on 07/27/2012; 3.8 on 09/07/2012; 3.4 on 10/19/2012. Increased at 11.9 on 02/19/2013. [name] normal 07/18/2013   Disposition:  Peggy French remains in clinical remission from colon cancer. She will keep the Port-A-Cath in place for now. She will return for a Port-A-Cath flush and [name] in 6 weeks. She will be scheduled for an office visit in 3 months. We will obtain a repeat CT of the chest in 4-6 months.  She will discuss decreasing the Neurontin dose with her  diabetes physician.   Peggy Coder, MD  07/20/2013  4:01 PM

## 2013-08-07 ENCOUNTER — Other Ambulatory Visit: Payer: Self-pay | Admitting: *Deleted

## 2013-08-07 DIAGNOSIS — C3490 Malignant neoplasm of unspecified part of unspecified bronchus or lung: Secondary | ICD-10-CM

## 2013-08-08 DIAGNOSIS — IMO0001 Reserved for inherently not codable concepts without codable children: Secondary | ICD-10-CM | POA: Diagnosis not present

## 2013-08-08 DIAGNOSIS — G609 Hereditary and idiopathic neuropathy, unspecified: Secondary | ICD-10-CM | POA: Diagnosis not present

## 2013-08-08 DIAGNOSIS — I10 Essential (primary) hypertension: Secondary | ICD-10-CM | POA: Diagnosis not present

## 2013-08-09 ENCOUNTER — Ambulatory Visit (INDEPENDENT_AMBULATORY_CARE_PROVIDER_SITE_OTHER): Payer: Medicare Other | Admitting: Cardiothoracic Surgery

## 2013-08-09 ENCOUNTER — Encounter: Payer: Self-pay | Admitting: Cardiothoracic Surgery

## 2013-08-09 VITALS — BP 142/82 | HR 78 | Resp 20 | Ht 64.0 in | Wt 207.0 lb

## 2013-08-09 DIAGNOSIS — Z09 Encounter for follow-up examination after completed treatment for conditions other than malignant neoplasm: Secondary | ICD-10-CM | POA: Diagnosis not present

## 2013-08-09 DIAGNOSIS — C3490 Malignant neoplasm of unspecified part of unspecified bronchus or lung: Secondary | ICD-10-CM

## 2013-08-09 NOTE — Progress Notes (Signed)
[phone]       [place] [phone]             [phone]                     Jamela C Afshar [place] Medical Record #[id number] Date of Birth: 1941/05/07  Iona Beard, MD Maggie Font, MD  Chief Complaint:   PostOp Follow Up Visit 03/27/2013  PREOPERATIVE DIAGNOSIS: New left lung mass.  POSTOPERATIVE DIAGNOSIS: New left lung mass, adenocarcinoma by frozen  section, question colon metastasis versus primary tumor.  PROCEDURE PERFORMED: Bronchoscopy, left video-assisted thoracoscopy,  wedge resection, superior segment left lower lobe, and lymph node  dissection.   PATH: 1. Lung, wedge biopsy/resection, Inferior segment of left lower lobe - METASTATIC ADENOCARCINOMA OF COLORECTAL PRIMARY SOURCE, 1.7 CM. - MARGIN IS NEGATIVE. - SEE COMMENT. 2. Lymph node, biopsy, 4 L - ONE BENIGN LYMPH NODE WITH NO TUMOR SEEN (0/1).   History of Present Illness:     Patient doing well following wedge resection of a metastatic adenocarcinoma of colorectal source in September of 2014. She comes in today for followup visit, 2 weeks ago she had a repeat CT scan of the chest. She's returned to normal activities without shortness of breath. She does note an occasional wheeze. She is up-to-date on pneumococcal and flu vaccination. Zubrod Score: At the time of visit  this patient's most appropriate activity status/level should be described as: [x]     0    Normal activity, no symptoms []     1    Restricted in physical strenuous activity but ambulatory, able to do out light work []     2    Ambulatory and capable of self care, unable to do work activities, up and about >50 % of waking hours                              []     3    Only limited self care, in bed greater than 50% of waking hours []     4    Completely disabled, no self care, confined to bed or chair []     5    Moribund   History  Smoking status  . Never Smoker   Smokeless tobacco  . Never Used        Allergies  Allergen Reactions  . Aspirin Palpitations    Current Outpatient Prescriptions  Medication Sig Dispense Refill  . acetaminophen (TYLENOL) 500 MG tablet Take 1,000 mg by mouth every 6 (six) hours as needed. For pain      . amLODipine (NORVASC) 5 MG tablet Take 5 mg by mouth daily as needed.       Marland Kitchen esomeprazole (NEXIUM) 40 MG capsule Take 40 mg by mouth every evening.      . fexofenadine (ALLEGRA) 180 MG tablet Take 180 mg by mouth daily as needed. For Allergies      . gabapentin (NEURONTIN) 100 MG capsule Take 300 mg by mouth 4 (four) times daily.       Marland Kitchen lidocaine-prilocaine (EMLA) cream Apply topically as needed. Apply to port-a-cath 1-2 hours prior to chemo.  Cover with plastic wrap  30 g  1  . linagliptin (TRADJENTA) 5 MG TABS tablet Take 5 mg by mouth daily after breakfast.       . propranolol-hydrochlorothiazide (INDERIDE) 40-25 MG per tablet Take 1 tablet by  mouth daily with breakfast.      . ramipril (ALTACE) 5 MG capsule Take 5 mg by mouth daily with breakfast.       . simvastatin (ZOCOR) 40 MG tablet Take 40 mg by mouth at bedtime.       . traMADol (ULTRAM) 50 MG tablet Take 1 tablet (50 mg total) by mouth every 8 (eight) hours as needed for pain.  30 tablet  0   No current facility-administered medications for this visit.   Facility-Administered Medications Ordered in Other Visits  Medication Dose Route Frequency Provider Last Rate Last Dose  . sodium chloride 0.9 % injection 10 mL  10 mL Intracatheter PRN Ladell Pier, MD           Physical Exam: BP 142/82  Pulse 78  Resp 20  Ht 5\' 4"  (1.626 m)  Wt 207 lb (93.895 kg)  BMI 35.51 kg/m2  SpO2 96%  General appearance: alert and cooperative Neurologic: intact Heart: regular rate and rhythm, S1, S2 normal, no murmur, click, rub or gallop Lungs: clear to auscultation bilaterally and normal percussion bilaterally Abdomen: soft, non-tender; bowel sounds normal; no masses,  no  organomegaly Extremities: extremities normal, atraumatic, no cyanosis or edema and Homans sign is negative, no sign of DVT Wound: Now well healed, without palpable masses, no significant keloid Patient has no cervical or supraclavicular adenopathy Port site is well-healed without evidence of infection  Diagnostic Studies & Laboratory data:         Recent Radiology Findings: Ct Chest Wo Contrast  07/18/2013   CLINICAL DATA:  Colon cancer.  EXAM: CT CHEST WITHOUT CONTRAST  TECHNIQUE: Multidetector CT imaging of the chest was performed following the standard protocol without IV contrast.  COMPARISON:  02/22/2013  FINDINGS: The tip of the left Port-A-Cath is positioned at the junction of the SVC and RA. There is no axillary lymphadenopathy. No mediastinal or hilar lymphadenopathy. Heart size is normal. Coronary artery calcification is noted. No pericardial or pleural effusion.  Lung windows reveal no worrisome nodule or mass. Surgical suture material is identified in the left lower lobe, at the location that the nodule was seen previously. This represents the 1st post operative CT exam and linear soft tissue attenuation associated with the suture line is felt to be most consistent with scarring. No new parenchymal nodule or mass is evident.  Bone windows reveal no worrisome lytic or sclerotic osseous lesions.  IMPRESSION: Interval resection of the left lower lobe pulmonary nodule with suture line and probable scar at this location today. No new or progressive disease in the chest.   Electronically Signed   By: Misty Stanley M.D.   On: 07/18/2013 13:18    Recent Labs: Lab Results  Component Value Date   WBC 5.1 07/18/2013   HGB 12.2 07/18/2013   HCT 36.8 07/18/2013   PLT 208 07/18/2013   GLUCOSE 132 07/18/2013   CHOL 163 04/02/2011   TRIG 57 04/02/2011   HDL 60 04/02/2011   LDLCALC 92 04/02/2011   ALT 10 07/18/2013   AST 19 07/18/2013   NA 139 07/18/2013   K 3.7 07/18/2013   CL 101 03/30/2013   CREATININE 1.0  07/18/2013   BUN 12.2 07/18/2013   CO2 29 07/18/2013   TSH 1.908 04/01/2011   INR 0.96 03/23/2013   HGBA1C 6.5* 04/01/2011   Lab Results  Component Value Date   [name] 3.4 07/18/2013      Assessment / Plan:      Stable  progress following wedge resection of left lung lesion which was determined to be an isolated nodule of metastatic adenocarcinoma from the colon. Recent CT scan shows no evidence of recurrent pulmonary metastasis. She continues to be followed on a regular basis by oncology, I will leave that to their discretion concerning the timing of further scans I plan to see her back in 6 months.    Toriana Sponsel B 08/09/2013 1:30 PM

## 2013-08-13 ENCOUNTER — Emergency Department (HOSPITAL_COMMUNITY)
Admission: EM | Admit: 2013-08-13 | Discharge: 2013-08-13 | Disposition: A | Discharge status: Home / Self Care | Payer: Medicare Other | Attending: Emergency Medicine | Admitting: Emergency Medicine

## 2013-08-13 ENCOUNTER — Emergency Department (HOSPITAL_COMMUNITY): Payer: Medicare Other

## 2013-08-13 ENCOUNTER — Encounter (HOSPITAL_COMMUNITY): Payer: Self-pay | Admitting: Emergency Medicine

## 2013-08-13 DIAGNOSIS — Z9889 Other specified postprocedural states: Secondary | ICD-10-CM | POA: Insufficient documentation

## 2013-08-13 DIAGNOSIS — K219 Gastro-esophageal reflux disease without esophagitis: Secondary | ICD-10-CM | POA: Diagnosis not present

## 2013-08-13 DIAGNOSIS — Z79899 Other long term (current) drug therapy: Secondary | ICD-10-CM | POA: Insufficient documentation

## 2013-08-13 DIAGNOSIS — I10 Essential (primary) hypertension: Secondary | ICD-10-CM | POA: Diagnosis not present

## 2013-08-13 DIAGNOSIS — Z85038 Personal history of other malignant neoplasm of large intestine: Secondary | ICD-10-CM | POA: Insufficient documentation

## 2013-08-13 DIAGNOSIS — M129 Arthropathy, unspecified: Secondary | ICD-10-CM | POA: Insufficient documentation

## 2013-08-13 DIAGNOSIS — M79662 Pain in left lower leg: Secondary | ICD-10-CM

## 2013-08-13 DIAGNOSIS — I252 Old myocardial infarction: Secondary | ICD-10-CM | POA: Diagnosis not present

## 2013-08-13 DIAGNOSIS — E119 Type 2 diabetes mellitus without complications: Secondary | ICD-10-CM | POA: Insufficient documentation

## 2013-08-13 DIAGNOSIS — E785 Hyperlipidemia, unspecified: Secondary | ICD-10-CM | POA: Diagnosis not present

## 2013-08-13 DIAGNOSIS — I2510 Atherosclerotic heart disease of native coronary artery without angina pectoris: Secondary | ICD-10-CM | POA: Insufficient documentation

## 2013-08-13 DIAGNOSIS — Z85118 Personal history of other malignant neoplasm of bronchus and lung: Secondary | ICD-10-CM | POA: Insufficient documentation

## 2013-08-13 DIAGNOSIS — M79609 Pain in unspecified limb: Secondary | ICD-10-CM | POA: Insufficient documentation

## 2013-08-13 DIAGNOSIS — J45909 Unspecified asthma, uncomplicated: Secondary | ICD-10-CM | POA: Insufficient documentation

## 2013-08-13 LAB — BASIC METABOLIC PANEL
BUN: 14 mg/dL (ref 6–23)
CO2: 31 mEq/L (ref 19–32)
Calcium: 9 mg/dL (ref 8.4–10.5)
Chloride: 102 mEq/L (ref 96–112)
Creatinine, Ser: 0.85 mg/dL (ref 0.50–1.10)
GFR calc Af Amer: 78 mL/min — ABNORMAL LOW (ref >90)
GFR, EST NON AFRICAN AMERICAN: 67 mL/min — AB (ref >90)
Glucose, Bld: 74 mg/dL (ref 70–99)
Potassium: 4 mEq/L (ref 3.7–5.3)
Sodium: 142 mEq/L (ref 137–147)

## 2013-08-13 LAB — CK: Total CK: 163 U/L (ref 7–177)

## 2013-08-13 NOTE — ED Provider Notes (Signed)
CSN: [id number]     Arrival date & time 08/13/13  1118 History   First MD Initiated Contact with Patient 08/13/13 1213     This chart was scribed for Ezequiel Essex, MD by Lovena Le Day, ED scribe. This patient was seen in room APA17/APA17 and the patient's care was started at 1213.  Chief Complaint  Patient presents with  . Leg Pain   The history is provided by the patient. No language interpreter was used.   HPI Comments: Peggy French is a 73 y.o. female who presents to the Emergency Department complaining of non-radiating left calf pain, onset 10 hours ago while sleeping in bed. She states concerned for potential risk of DVT. She had lung surgery for lung CA x4 months ago and is now cancer free. She denies any CP or SOB. She denies calf swelling but reports left posterior calf pain.  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 She denies back pain, fever or cough.  She also had colon CA in 2013 which also spread to her heart and had successful surgery later as well; she is cancer free.   PCP Dr. Berdine Addison  Past Medical History  Diagnosis Date  . Gout   . Diabetes mellitus     x over 10 yrs  . Hyperlipidemia   . GERD (gastroesophageal reflux disease)   . Chest pain   . H/O hiatal hernia   . Headache(784.0)     hx migraines  . Arthritis   . Sleep apnea     does not have CPAP machine  sleep study done on 05/19/2011  AHI during total sleep time (3h 53 min) 13.1/hr and REM sleep at 46.7/hr  . Cancer     LOV Dr Benay Spice 02/14/12 [facility]  . Asthmatic bronchitis 2011  . Hypertension     EKG 10/12 [facility], chest- 1 view  6/13 [facility]    Last 2D Echo on 05/02/2012 showed EF of greater than 55%  . Coronary artery disease   . Myocardial infarction     2001  . 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 Cholecystectomy    . Abdominal hysterectomy    . Colon surgery      Tumor  . Esophagogastroduodenoscopy  06/16/2011     Procedure: ESOPHAGOGASTRODUODENOSCOPY (EGD);  Surgeon: Rogene Houston, MD;  Location: [facility];  Service: Endoscopy;  Laterality: N/A;  2:15  . Colonoscopy  11/26/2011    Procedure: COLONOSCOPY;  Surgeon: Rogene Houston, MD;  Location: [facility];  Service: Endoscopy;  Laterality: N/A;  2:15  . Appendectomy    . Cardiac catheterization  2001,2012  . Portacath placement  02/22/2012    Procedure: INSERTION PORT-A-CATH;  Surgeon: Pedro Earls, MD;  Location: [facility];  Service: General;  Laterality: N/A;  left subclavian  . Colonoscopy N/A 12/08/2012    Procedure: COLONOSCOPY;  Surgeon: Rogene Houston, MD;  Location: [facility];  Service: Endoscopy;  Laterality: N/A;  815-moved to 0950 Ann to notify pt  . Video bronchoscopy N/A 03/27/2013    Procedure: VIDEO BRONCHOSCOPY;  Surgeon: Grace Isaac, MD;  Location: [facility];  Service: Thoracic;  Laterality: N/A;  . Video assisted thoracoscopy (vats)/wedge resection Left 03/27/2013    Procedure: VIDEO ASSISTED THORACOSCOPY (VATS)/WEDGE RESECTION;  Surgeon: Grace Isaac, MD;  Location: [facility];  Service: Thoracic;  Laterality: Left;   Family History  Problem Relation Age  of Onset  . Cancer Maternal Aunt     throat,    History  Substance Use Topics  . Smoking status: Never Smoker   . Smokeless tobacco: Never Used  . Alcohol Use: No   OB History   Grav Para Term Preterm Abortions TAB SAB Ect Mult Living                 Review of Systems  All other systems reviewed and are negative.  A complete 10 system review of systems was obtained and all systems are negative except as noted in the HPI and PMH.   Allergies  Aspirin  Home Medications   Current Outpatient Rx  Name  Route  Sig  Dispense  Refill  . amLODipine (NORVASC) 5 MG tablet   Oral   Take 5 mg by mouth daily as needed.          . carboxymethylcellulose (REFRESH PLUS) 0.5 % SOLN   Both Eyes   Place 2 drops into both eyes daily as needed (dry eyes).          Marland Kitchen esomeprazole (NEXIUM) 40 MG capsule   Oral   Take 40 mg by mouth every evening.         . fexofenadine (ALLEGRA) 180 MG tablet   Oral   Take 180 mg by mouth daily as needed. For Allergies         . gabapentin (NEURONTIN) 100 MG capsule   Oral   Take 300 mg by mouth 4 (four) times daily.          Marland Kitchen lidocaine-prilocaine (EMLA) cream   Topical   Apply topically as needed. Apply to port-a-cath 1-2 hours prior to chemo.  Cover with plastic wrap   30 g   1   . linagliptin (TRADJENTA) 5 MG TABS tablet   Oral   Take 5 mg by mouth daily after breakfast.          . Phenazopyridine HCl (AZO TABS PO)   Oral   Take 1 tablet by mouth 3 (three) times daily.         . propranolol-hydrochlorothiazide (INDERIDE) 40-25 MG per tablet   Oral   Take 1 tablet by mouth daily with breakfast.         . ramipril (ALTACE) 5 MG capsule   Oral   Take 5 mg by mouth daily with breakfast.          . simvastatin (ZOCOR) 40 MG tablet   Oral   Take 40 mg by mouth at bedtime.          . traMADol (ULTRAM) 50 MG tablet   Oral   Take 1 tablet (50 mg total) by mouth every 8 (eight) hours as needed for pain.   30 tablet   0   . acetaminophen (TYLENOL) 500 MG tablet   Oral   Take 1,000 mg by mouth every 6 (six) hours as needed. For pain          Triage Vitals: BP 131/67  Pulse 80  Temp(Src) 98.3 F (36.8 C) (Oral)  Resp 20  Ht 5\' 4"  (1.626 m)  Wt 211 lb (95.709 kg)  BMI 36.20 kg/m2  SpO2 99% Physical Exam  Nursing note and vitals reviewed. Constitutional: She is oriented to person, place, and time. She appears well-developed and well-nourished. No distress.  HENT:  Head: Normocephalic and atraumatic.  Eyes: Conjunctivae are normal. Right eye exhibits no discharge. Left eye exhibits no discharge.  Neck: Normal range of motion.  Cardiovascular: Normal rate and normal heart sounds.   No murmur heard. Pulmonary/Chest: Effort normal and breath sounds normal. No respiratory  distress. She has no wheezes. She has no rales.  Musculoskeletal: Normal range of motion. She exhibits tenderness. She exhibits no edema.  left calf tenderness, no asymmetry intact tp dand dp pulses no overlying skin changes  Neurological: She is alert and oriented to person, place, and time.  Skin: Skin is warm and dry.  Psychiatric: She has a normal mood and affect. Thought content normal.    ED Course  Procedures (including critical care time) DIAGNOSTIC STUDIES: Oxygen Saturation is 99% on room air, normal by my interpretation.    COORDINATION OF CARE: At 2233 PM Discussed treatment plan with patient. Patient agrees.   Labs Review Labs Reviewed  BASIC METABOLIC PANEL - Abnormal; Notable for the following:    GFR calc non Af Amer 67 (*)    GFR calc Af Amer 78 (*)    All other components within normal limits  CK   Imaging Review US Venous Img Lower Unilateral Left  08/13/2013   CLINICAL DATA:  Left lower leg pain  EXAM: Left LOWER EXTREMITY VENOUS DOPPLER ULTRASOUND  TECHNIQUE: Gray-scale sonography with graded compression, as well as color Doppler and duplex ultrasound, were performed to evaluate the deep venous system from the level of the common femoral vein through the popliteal and proximal calf veins. Spectral Doppler was utilized to evaluate flow at rest and with distal augmentation maneuvers.  COMPARISON:  None.  FINDINGS: Thrombus within deep veins:  None visualized.  Compressibility of deep veins:  Normal.  Duplex waveform respiratory phasicity:  Normal.  Duplex waveform response to augmentation:  Normal.  Venous reflux:  None visualized.  Other findings: The visualized superficial great saphenous vein is patent and compressible.  IMPRESSION: No evidence of acute DVT.   Electronically Signed   By: Jacqulynn Cadet M.D.   On: 08/13/2013 13:45   US Arterial Seg Single  08/13/2013   CLINICAL DATA:  Left lower extremity rest pain and claudication  EXAM: NONINVASIVE PHYSIOLOGIC  VASCULAR STUDY OF BILATERAL LOWER EXTREMITIES  TECHNIQUE: Evaluation of both lower extremities were performed at rest, including calculation of ankle-brachial indices with single level Doppler, pressure and pulse volume recording.  COMPARISON:  CT abdomen/ pelvis 11/28/2002 to ; duplex venous ultrasound study performed today 08/13/2013  FINDINGS: Right ABI:  1.1  Left ABI:  1.1  Right Lower Extremity: Unremarkable triphasic posterior tibial and dorsalis pedis waveforms.  Left Lower Extremity: Unremarkable triphasic posterior tibial and dorsalis pedis waveforms.  IMPRESSION: Normal resting ankle-brachial indices bilaterally.   Electronically Signed   By: Jacqulynn Cadet M.D.   On: 08/13/2013 13:43    EKG Interpretation   None       MDM   1. Pain of left calf    Pain in the left calf that started this morning. No injury. No chest pain or shortness of breath. History of colon cancer that metastasized to her lungs. Cancer free at this time.  Distal pulses intact. We'll evaluate for DVT.  Doppler negative for DVT.  Normal ABIs.  Intact distal pulses.  No evidence of acute limb ischemia or arterial insufficiency. Electrolytes and CK normal. Will treat supportively.  BP 139/76  Pulse 68  Temp(Src) 98.3 F (36.8 C) (Oral)  Resp 16  Ht 5\' 4"  (1.626 m)  Wt 211 lb (95.709 kg)  BMI 36.20 kg/m2  SpO2 97%  I personally performed the services described in this documentation, which was scribed in my presence. The recorded information has been reviewed and is accurate.    Ezequiel Essex, MD 08/13/13 [id number]

## 2013-08-13 NOTE — Discharge Instructions (Signed)
Myalgia, Adult There is no evidence of blood clot in the leg. Follow up with your doctor. Return to the ED if you develop new or worsening symptoms. Myalgia is the medical term for muscle pain. It is a symptom of many things. Nearly everyone at some time in their life has this. The most common cause for muscle pain is overuse or straining and more so when you are not in shape. Injuries and muscle bruises cause myalgias. Muscle pain without a history of injury can also be caused by a virus. It frequently comes along with the flu. Myalgia not caused by muscle strain can be present in a large number of infectious diseases. Some autoimmune diseases like lupus and fibromyalgia can cause muscle pain. Myalgia may be mild, or severe. SYMPTOMS  The symptoms of myalgia are simply muscle pain. Most of the time this is short lived and the pain goes away without treatment. DIAGNOSIS  Myalgia is diagnosed by your caregiver by taking your history. This means you tell him when the problems began, what they are, and what has been happening. If this has not been a long term problem, your caregiver may want to watch for a while to see what will happen. If it has been long term, they may want to do additional testing. TREATMENT  The treatment depends on what the underlying cause of the muscle pain is. Often anti-inflammatory medications will help. HOME CARE INSTRUCTIONS  If the pain in your muscles came from overuse, slow down your activities until the problems go away.  Myalgia from overuse of a muscle can be treated with alternating hot and cold packs on the muscle affected or with cold for the first couple days. If either heat or cold seems to make things worse, stop their use.  Apply ice to the sore area for 15-20 minutes, 03-04 times per day, while awake for the first 2 days of muscle soreness, or as directed. Put the ice in a plastic bag and place a towel between the bag of ice and your skin.  Only take  over-the-counter or prescription medicines for pain, discomfort, or fever as directed by your caregiver.  Regular gentle exercise may help if you are not active.  Stretching before strenuous exercise can help lower the risk of myalgia. It is normal when beginning an exercise regimen to feel some muscle pain after exercising. Muscles that have not been used frequently will be sore at first. If the pain is extreme, this may mean injury to a muscle. SEEK MEDICAL CARE IF:  You have an increase in muscle pain that is not relieved with medication.  You begin to run a temperature.  You develop nausea and vomiting.  You develop a stiff and painful neck.  You develop a rash.  You develop muscle pain after a tick bite.  You have continued muscle pain while working out even after you are in good condition. SEEK IMMEDIATE MEDICAL CARE IF: Any of your problems are getting worse and medications are not helping. MAKE SURE YOU:   Understand these instructions.  Will watch your condition.  Will get help right away if you are not doing well or get worse. Document Released: 05/20/2006 Document Revised: 09/20/2011 Document Reviewed: 08/09/2006 [facility] Patient Information 2014 [place], [place].

## 2013-08-13 NOTE — ED Notes (Signed)
Complain of pain in left calf that started this morning. States she was concerned it may be a clot due to having surgery in September

## 2013-08-15 DIAGNOSIS — G2581 Restless legs syndrome: Secondary | ICD-10-CM | POA: Diagnosis not present

## 2013-08-15 DIAGNOSIS — E119 Type 2 diabetes mellitus without complications: Secondary | ICD-10-CM | POA: Diagnosis not present

## 2013-08-15 DIAGNOSIS — C3490 Malignant neoplasm of unspecified part of unspecified bronchus or lung: Secondary | ICD-10-CM | POA: Diagnosis not present

## 2013-08-29 ENCOUNTER — Ambulatory Visit (HOSPITAL_BASED_OUTPATIENT_CLINIC_OR_DEPARTMENT_OTHER): Payer: Medicare Other

## 2013-08-29 ENCOUNTER — Other Ambulatory Visit: Payer: Medicare Other

## 2013-08-29 VITALS — BP 152/68 | HR 72 | Temp 97.9°F

## 2013-08-29 DIAGNOSIS — C182 Malignant neoplasm of ascending colon: Secondary | ICD-10-CM

## 2013-08-29 DIAGNOSIS — C7800 Secondary malignant neoplasm of unspecified lung: Secondary | ICD-10-CM | POA: Diagnosis not present

## 2013-08-29 DIAGNOSIS — C180 Malignant neoplasm of cecum: Secondary | ICD-10-CM | POA: Diagnosis not present

## 2013-08-29 DIAGNOSIS — Z95828 Presence of other vascular implants and grafts: Secondary | ICD-10-CM

## 2013-08-29 MED ORDER — SODIUM CHLORIDE 0.9 % IJ SOLN
10.0000 mL | INTRAMUSCULAR | Status: DC | PRN
  Administered 2013-08-29: 10 mL via INTRAVENOUS
  Filled 2013-08-29: qty 10

## 2013-08-29 MED ORDER — HEPARIN SOD (PORK) LOCK FLUSH 100 UNIT/ML IV SOLN
500.0000 [IU] | Freq: Once | INTRAVENOUS | Status: AC
  Administered 2013-08-29: 500 [IU] via INTRAVENOUS
  Filled 2013-08-29: qty 5

## 2013-08-29 NOTE — Patient Instructions (Signed)

## 2013-08-30 LAB — CEA: CEA: 3.1 ng/mL (ref 0.0–5.0)

## 2013-08-31 ENCOUNTER — Telehealth: Payer: Self-pay | Admitting: *Deleted

## 2013-08-31 NOTE — Telephone Encounter (Signed)
Called and informed patient of normal cea.  Per Dr. Sherrill.  Patient verbalized understanding.  

## 2013-08-31 NOTE — Telephone Encounter (Signed)
Message copied by Norma Fredrickson on Fri Aug 31, 2013  2:13 PM ------      Message from: [place], [place]      Created: Fri Aug 31, 2013  1:43 PM                   ----- Message -----         From: Ladell Pier, MD         Sent: 08/30/2013   8:06 PM           To: Tania Ade, RN, Ludwig Lean, RN, #            Please call patient, cea is normal ------

## 2013-09-14 DIAGNOSIS — K589 Irritable bowel syndrome without diarrhea: Secondary | ICD-10-CM | POA: Diagnosis not present

## 2013-09-14 DIAGNOSIS — I10 Essential (primary) hypertension: Secondary | ICD-10-CM | POA: Diagnosis not present

## 2013-09-14 DIAGNOSIS — E119 Type 2 diabetes mellitus without complications: Secondary | ICD-10-CM | POA: Diagnosis not present

## 2013-09-14 DIAGNOSIS — R197 Diarrhea, unspecified: Secondary | ICD-10-CM | POA: Diagnosis not present

## 2013-09-14 DIAGNOSIS — R0602 Shortness of breath: Secondary | ICD-10-CM | POA: Diagnosis not present

## 2013-09-17 ENCOUNTER — Other Ambulatory Visit (HOSPITAL_COMMUNITY): Payer: Self-pay | Admitting: Family Medicine

## 2013-09-17 DIAGNOSIS — R0602 Shortness of breath: Secondary | ICD-10-CM

## 2013-09-17 DIAGNOSIS — C3490 Malignant neoplasm of unspecified part of unspecified bronchus or lung: Secondary | ICD-10-CM

## 2013-09-19 ENCOUNTER — Ambulatory Visit (HOSPITAL_COMMUNITY)
Admission: RE | Admit: 2013-09-19 | Discharge: 2013-09-19 | Disposition: A | Discharge status: Home / Self Care | Payer: Medicare Other | Source: Ambulatory Visit | Attending: Family Medicine | Admitting: Family Medicine

## 2013-09-19 DIAGNOSIS — Z85118 Personal history of other malignant neoplasm of bronchus and lung: Secondary | ICD-10-CM | POA: Diagnosis not present

## 2013-09-19 DIAGNOSIS — J8410 Pulmonary fibrosis, unspecified: Secondary | ICD-10-CM | POA: Diagnosis not present

## 2013-09-19 DIAGNOSIS — R0602 Shortness of breath: Secondary | ICD-10-CM | POA: Insufficient documentation

## 2013-09-19 DIAGNOSIS — C3490 Malignant neoplasm of unspecified part of unspecified bronchus or lung: Secondary | ICD-10-CM

## 2013-09-19 MED ORDER — IOHEXOL 300 MG/ML  SOLN
80.0000 mL | Freq: Once | INTRAMUSCULAR | Status: AC | PRN
  Administered 2013-09-19: 80 mL via INTRAVENOUS

## 2013-09-21 DIAGNOSIS — R0602 Shortness of breath: Secondary | ICD-10-CM | POA: Diagnosis not present

## 2013-10-09 ENCOUNTER — Ambulatory Visit (HOSPITAL_BASED_OUTPATIENT_CLINIC_OR_DEPARTMENT_OTHER): Payer: Medicare Other

## 2013-10-09 ENCOUNTER — Telehealth: Payer: Self-pay | Admitting: Oncology

## 2013-10-09 ENCOUNTER — Ambulatory Visit (HOSPITAL_BASED_OUTPATIENT_CLINIC_OR_DEPARTMENT_OTHER): Payer: Medicare Other | Admitting: Nurse Practitioner

## 2013-10-09 ENCOUNTER — Other Ambulatory Visit: Payer: Medicare Other

## 2013-10-09 VITALS — BP 161/74 | HR 69 | Temp 98.1°F | Resp 18 | Ht 64.0 in | Wt 211.1 lb

## 2013-10-09 DIAGNOSIS — C7800 Secondary malignant neoplasm of unspecified lung: Principal | ICD-10-CM

## 2013-10-09 DIAGNOSIS — C189 Malignant neoplasm of colon, unspecified: Secondary | ICD-10-CM

## 2013-10-09 DIAGNOSIS — C180 Malignant neoplasm of cecum: Secondary | ICD-10-CM | POA: Diagnosis not present

## 2013-10-09 DIAGNOSIS — Z95828 Presence of other vascular implants and grafts: Secondary | ICD-10-CM

## 2013-10-09 DIAGNOSIS — J8410 Pulmonary fibrosis, unspecified: Secondary | ICD-10-CM

## 2013-10-09 MED ORDER — HEPARIN SOD (PORK) LOCK FLUSH 100 UNIT/ML IV SOLN
500.0000 [IU] | Freq: Once | INTRAVENOUS | Status: AC
  Administered 2013-10-09: 500 [IU] via INTRAVENOUS
  Filled 2013-10-09: qty 5

## 2013-10-09 MED ORDER — SODIUM CHLORIDE 0.9 % IJ SOLN
10.0000 mL | INTRAMUSCULAR | Status: DC | PRN
  Administered 2013-10-09: 10 mL via INTRAVENOUS
  Filled 2013-10-09: qty 10

## 2013-10-09 NOTE — Telephone Encounter (Signed)
gv and printed aptp sched and avs for pt for May and June

## 2013-10-09 NOTE — Progress Notes (Signed)
[place] OFFICE PROGRESS NOTE   Diagnosis:  Colon cancer.  INTERVAL HISTORY:   Peggy French returns as scheduled. She overall feels well. She has periodic leg cramps at nighttime. She denies nausea/vomiting. No abdominal pain. She has periodic loose stools and takes Imodium as needed. She continues to have tingling and leg pain. She takes gabapentin at nighttime. She reports a good appetite.   She was evaluated in the emergency Department on 08/13/2013 for left leg pain. Venous Doppler was negative for DVT. She had a chest CT on 09/19/2013 for evaluation of shortness of breath. Postoperative changes noted in the left lower lobe without evidence of recurrent or metastatic disease. There was mild subpleural reticulation at the lung bases indicative of mild fibrosis.  Objective:  Vital signs in last 24 hours:  Blood pressure 161/74, pulse 69, temperature 98.1 F (36.7 C), temperature source Oral, resp. rate 18, height 5\' 4"  (1.626 m), weight 211 lb 1.6 oz (95.754 kg), SpO2 100.00%.    HEENT: No thrush or ulcerations. Lymphatics: No palpable cervical, supraclavicular, axillary or inguinal lymph nodes. Resp: Lungs clear. Cardio: Regular cardiac rhythm. GI: Abdomen soft and nontender. No mass. No hepatomegaly. Vascular: No leg edema.  Portacath/PICC-without erythema.  Lab Results:  Lab Results  Component Value Date   WBC 5.1 07/18/2013   HGB 12.2 07/18/2013   HCT 36.8 07/18/2013   MCV 81.0 07/18/2013   PLT 208 07/18/2013   NEUTROABS 2.8 07/18/2013    Lab Results  Component Value Date   NA 142 08/13/2013    Lab Results  Component Value Date   [name] 3.1 08/29/2013    Imaging:  No results found.  Medications: I have reviewed the patient's current medications.  Assessment/Plan: 1. 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 She began adjuvant CAPOX chemotherapy on 02/24/2012. She completed cycle 8 on 07/27/2012. Negative  surveillance colonoscopy 12/08/2012 2. Delayed nausea following cycle 1 CAPOX. Aloxi was added beginning with cycle 2 with improvement. 3. Diabetes. 4. Microcytic anemia. Hemoglobin is normal. She will discontinue iron. 5. Remote history of a "colon tumor" removed endoscopically in 1980. 6. Left lower lung nodule noted on a CT 02/21/2012, slightly larger than on a CT 12/03/2011. Chest CT 06/12/2012 showed the previously seen 5 mm pulmonary nodule in left lower lobe was scarcely visualized, estimated at 3 mm. No other suspicious pulmonary nodules were identified. Chest CT 11/27/2012 showed interval enlargement of the left lower lobe pulmonary nodule with a current measurement of 7 mm x 8 mm. No new pulmonary nodules. CT 02/22/2013 with enlargement of the anterior left lower lobe nodule and no other lung nodules. PET scan 03/02/2013 showed no associated hypermetabolism with the 12 mm left lower lobe pulmonary nodule. No hypermetabolic lymph nodes in the neck, chest, abdomen or pelvis. No abnormal hypermetabolic activity within the liver, pancreas, adrenal glands or spleen. Abnormal FDG accumulation in the posterior left nasopharyngeal mucosa. Status post a wedge resection of the left lower lobe nodule on 03/27/2013 with the pathology confirming metastatic adenocarcinoma of colorectal primary.  Chest CT 07/18/2013-negative for recurrent disease. Chest CT 09/19/2013 to evaluate for shortness of breath showed postoperative changes in the left lower lobe without evidence of recurrent or metastatic disease. 7. Status post Port-A-Cath placement 02/22/2012. Status post Port-A-Cath repositioning 03/14/2012. 8. Probable acute laryngopharyngeal dysesthesia following cycle 2 CAPOX. Symptoms resolved with warming.  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 She has persistent neuropathy symptoms involving the feet. 10. History  of anterior chest pain-she reports undergoing an evaluation by Dr.  Debara Pickett. 11. 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 12. [name] at the upper end of the normal range on 07/27/2012; 3.8 on 09/07/2012; 3.4 on 10/19/2012. Increased at 11.9 on 02/19/2013. [name] normal 07/18/2013 and 08/29/2013.   Disposition: She appears stable. She remains in clinical remission from colon cancer. We will followup on the [name] from today.  She will return for a Port-A-Cath flush in 6 weeks. She will return for a [name] and office visit in 3 months. She will contact the office in the interim with any problems.  Plan reviewed with [name].    Ned Card ANP/GNP-BC   10/09/2013  1:29 PM

## 2013-10-09 NOTE — Patient Instructions (Signed)

## 2013-10-10 LAB — CEA: CEA: 3 ng/mL (ref 0.0–5.0)

## 2013-10-11 ENCOUNTER — Telehealth: Payer: Self-pay | Admitting: *Deleted

## 2013-10-11 NOTE — Telephone Encounter (Signed)
Message copied by Norma Fredrickson on Thu Oct 11, 2013  9:55 AM ------      Message from: [name] B      Created: Wed Oct 10, 2013  5:35 PM       Please call patient, cea is normal ------

## 2013-10-11 NOTE — Telephone Encounter (Signed)
Called and informed patient of normal cea.  Per Dr. Sherrill.  Patient verbalized understanding.  

## 2013-11-20 ENCOUNTER — Ambulatory Visit (HOSPITAL_BASED_OUTPATIENT_CLINIC_OR_DEPARTMENT_OTHER): Payer: Medicare Other

## 2013-11-20 VITALS — BP 137/71 | HR 75 | Temp 97.0°F

## 2013-11-20 DIAGNOSIS — C7800 Secondary malignant neoplasm of unspecified lung: Secondary | ICD-10-CM

## 2013-11-20 DIAGNOSIS — C180 Malignant neoplasm of cecum: Secondary | ICD-10-CM

## 2013-11-20 DIAGNOSIS — Z95828 Presence of other vascular implants and grafts: Secondary | ICD-10-CM

## 2013-11-20 DIAGNOSIS — Z452 Encounter for adjustment and management of vascular access device: Secondary | ICD-10-CM | POA: Diagnosis not present

## 2013-11-20 MED ORDER — HEPARIN SOD (PORK) LOCK FLUSH 100 UNIT/ML IV SOLN
500.0000 [IU] | Freq: Once | INTRAVENOUS | Status: AC
  Administered 2013-11-20: 500 [IU] via INTRAVENOUS
  Filled 2013-11-20: qty 5

## 2013-11-20 MED ORDER — SODIUM CHLORIDE 0.9 % IJ SOLN
10.0000 mL | INTRAMUSCULAR | Status: DC | PRN
  Administered 2013-11-20: 10 mL via INTRAVENOUS
  Filled 2013-11-20: qty 10

## 2013-11-20 NOTE — Patient Instructions (Signed)

## 2013-12-05 IMAGING — CR DG CHEST 1V PORT
1 series · 1 of 1 positions shown · non-contrast
Comparison: 03/28/2013

CLINICAL DATA: Chest tube

EXAM:
PORTABLE CHEST - 1 VIEW

[AP]
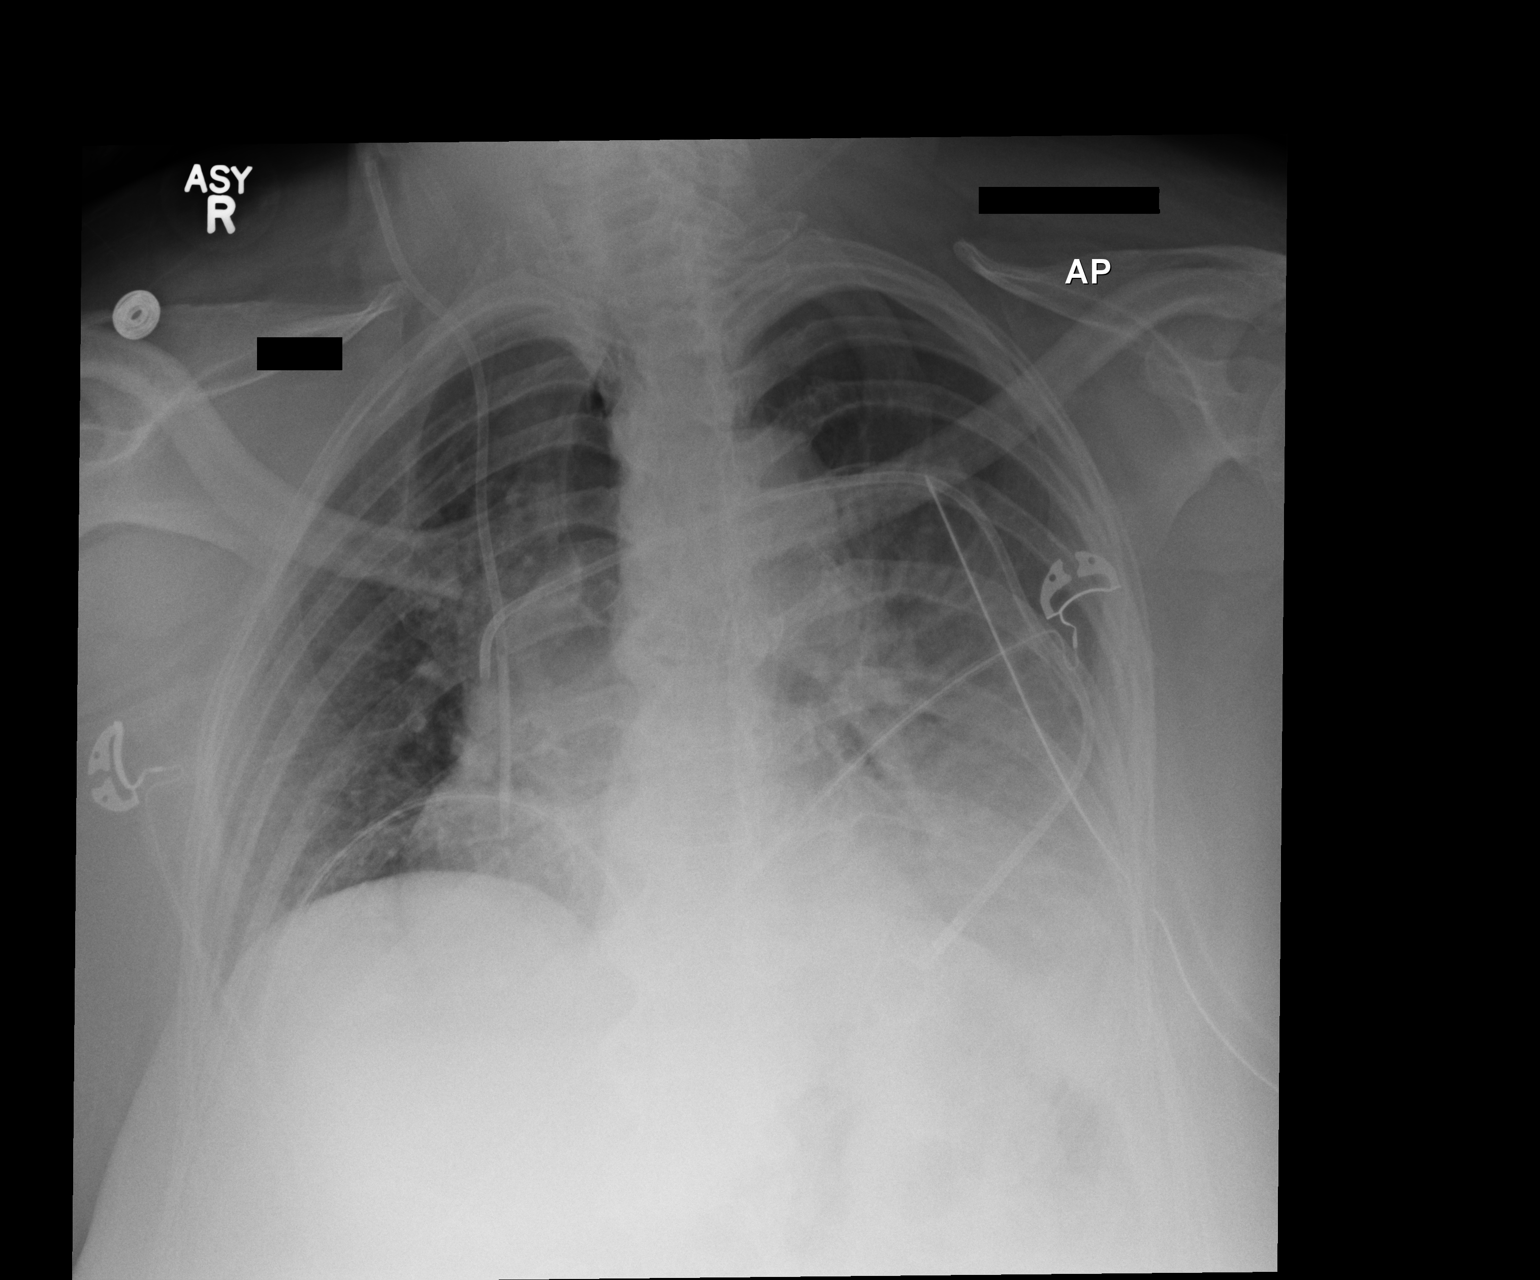

[1 of 1 positions shown; findings below may reference images not displayed]

FINDINGS: Left chest tube in good position without pneumothorax. Port-A-Cath
tip in central line unchanged.

Increase in left lower lobe density which may be progression of
airspace disease and effusion. No significant effusion on the right.
Negative for edema.
IMPRESSION: Progression of left lower lobe density consistent with airspace
disease and effusion. No pneumothorax. Left chest tube unchanged.

## 2013-12-06 IMAGING — CR DG CHEST 2V
2 series · 2 of 2 positions shown · non-contrast
Comparison: March 29, 2013.

CLINICAL DATA: Chest pain after lung resection.

CHEST - 2 VIEW

[w chest pa]
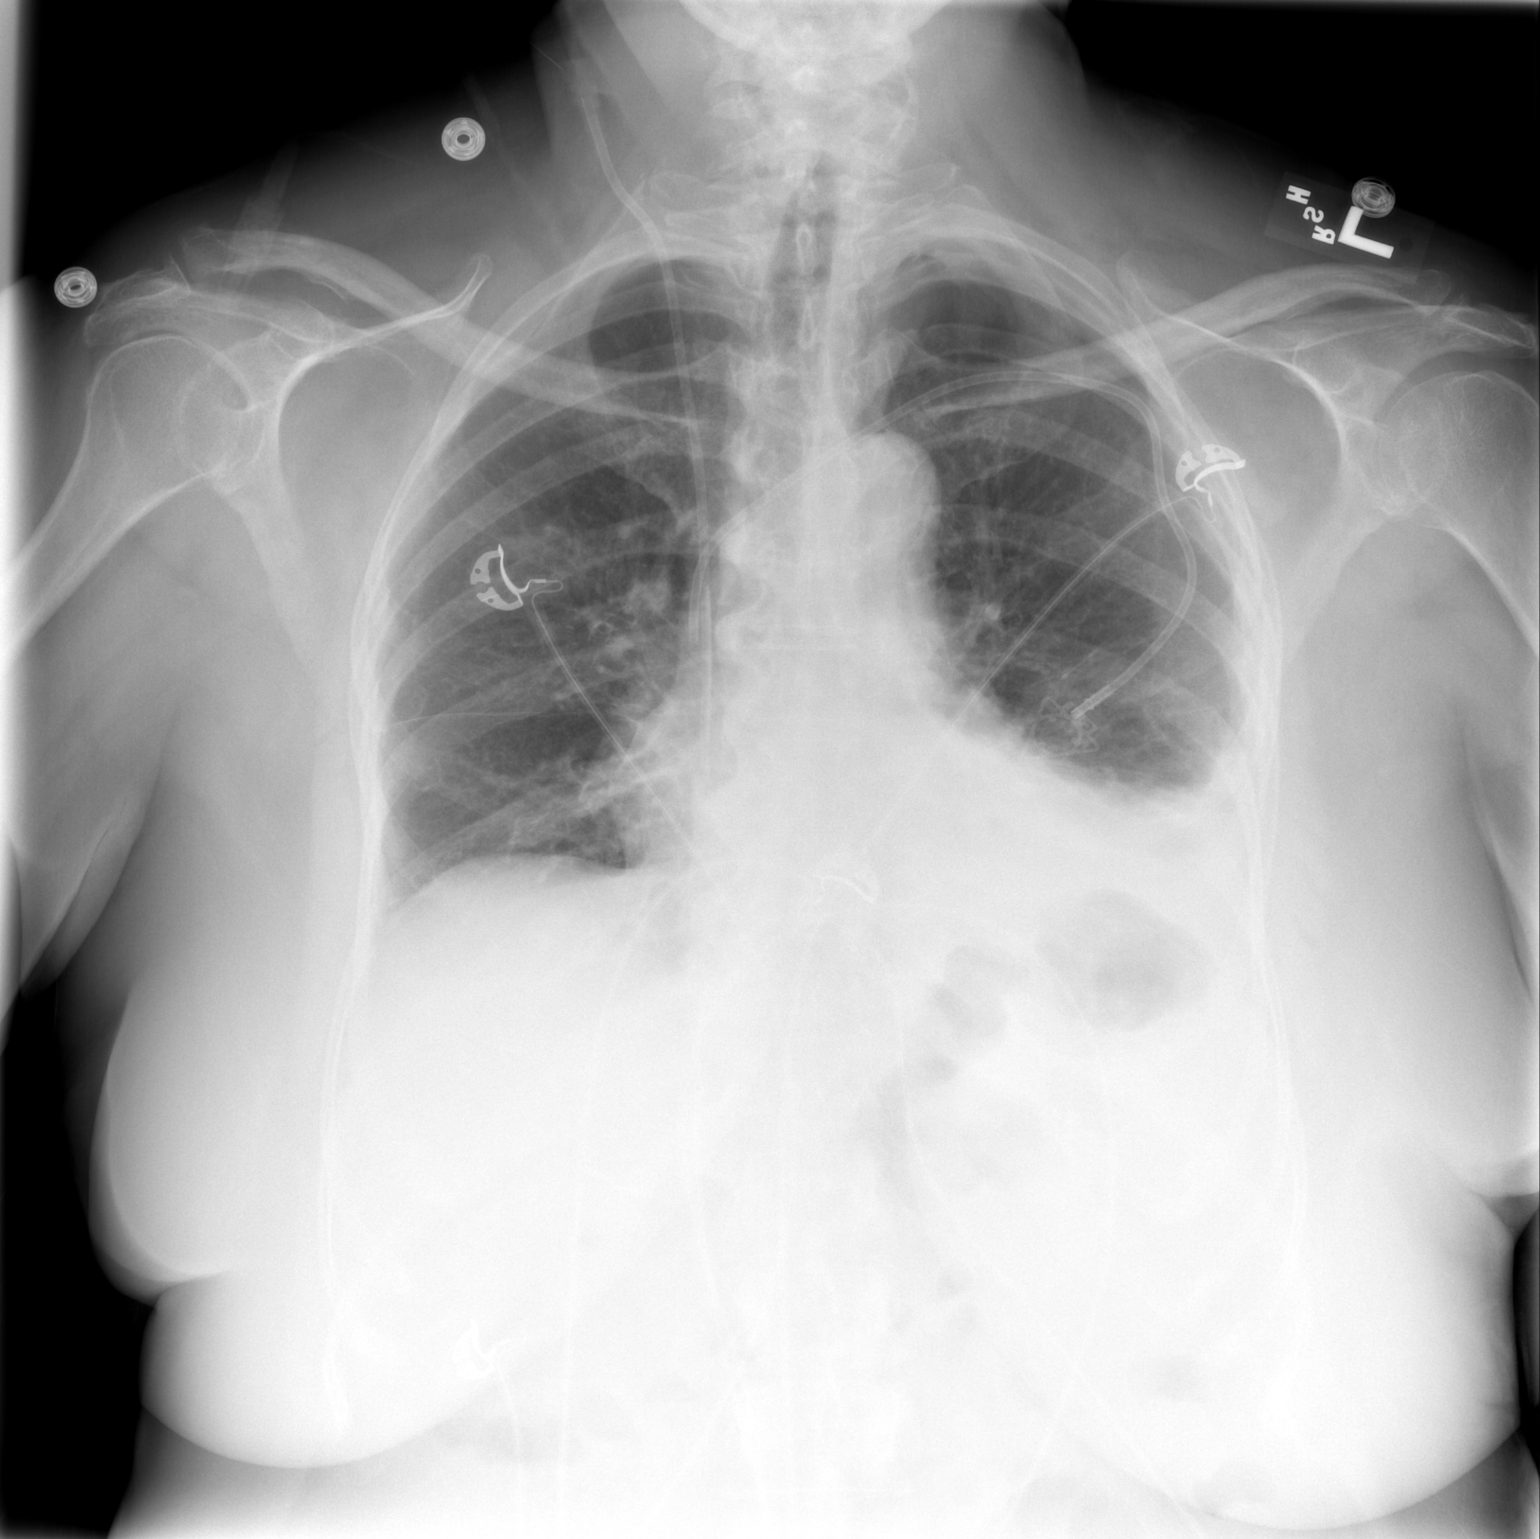

[w chest lat]
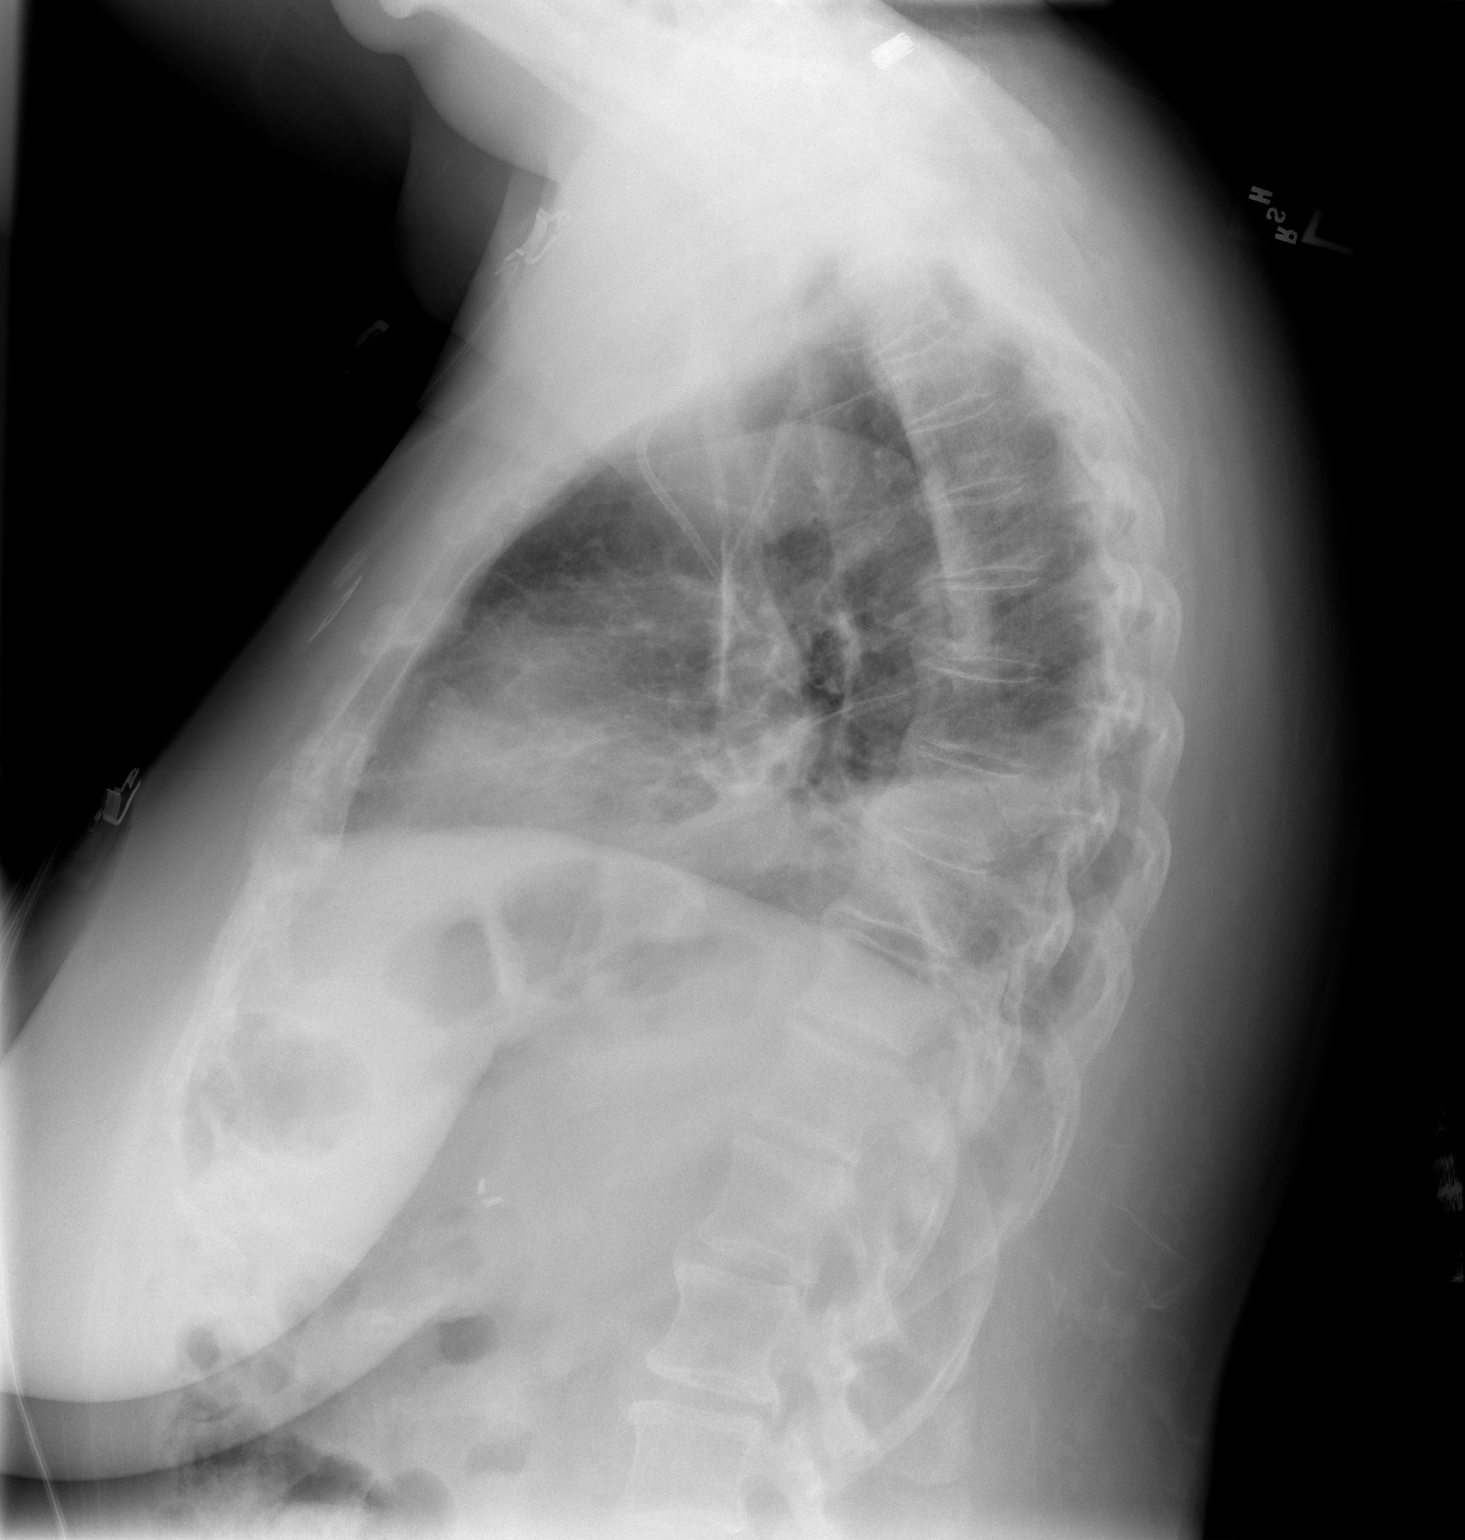

[2 of 2 positions shown; findings below may reference images not displayed]

FINDINGS: Stable cardiomediastinal silhouette.  Left subclavian
Port-A-Cath is unchanged in position.  Right internal jugular
catheter is noted and unchanged in position.  Left-sided chest tube
noted on prior exam has been removed.  No pneumothorax is noted.
Persistent left basilar opacity is noted consistent with
atelectasis and associated pleural effusion.  Right lung is clear.
IMPRESSION: No pneumothorax seen status post left-sided chest tube removal.
Persistent left basilar opacity is noted consistent with
atelectasis and associated pleural effusion.

## 2013-12-18 DIAGNOSIS — G4762 Sleep related leg cramps: Secondary | ICD-10-CM | POA: Diagnosis not present

## 2013-12-18 DIAGNOSIS — B009 Herpesviral infection, unspecified: Secondary | ICD-10-CM | POA: Diagnosis not present

## 2013-12-18 DIAGNOSIS — E1149 Type 2 diabetes mellitus with other diabetic neurological complication: Secondary | ICD-10-CM | POA: Diagnosis not present

## 2014-01-01 ENCOUNTER — Other Ambulatory Visit: Payer: Self-pay | Admitting: Physician Assistant

## 2014-01-01 DIAGNOSIS — E119 Type 2 diabetes mellitus without complications: Secondary | ICD-10-CM | POA: Diagnosis not present

## 2014-01-01 DIAGNOSIS — B009 Herpesviral infection, unspecified: Secondary | ICD-10-CM | POA: Diagnosis not present

## 2014-01-07 ENCOUNTER — Telehealth: Payer: Self-pay | Admitting: *Deleted

## 2014-01-07 NOTE — Telephone Encounter (Signed)
Dr. Berdine Addison has written a script for labs he wants drawn for him and she is asking if it can be done at her appointment tomorrow in our lab. Made her aware as long as she has a script with diagnosis code our lab can draw it and send off for results to go to Dr. Berdine Addison directly

## 2014-01-08 ENCOUNTER — Other Ambulatory Visit: Payer: Medicare Other

## 2014-01-08 ENCOUNTER — Telehealth: Payer: Self-pay | Admitting: Oncology

## 2014-01-08 ENCOUNTER — Ambulatory Visit (HOSPITAL_BASED_OUTPATIENT_CLINIC_OR_DEPARTMENT_OTHER): Payer: Medicare Other

## 2014-01-08 ENCOUNTER — Ambulatory Visit (HOSPITAL_BASED_OUTPATIENT_CLINIC_OR_DEPARTMENT_OTHER): Payer: Medicare Other | Admitting: Oncology

## 2014-01-08 VITALS — BP 141/63 | HR 82 | Temp 98.2°F | Resp 20 | Ht 64.0 in | Wt 209.5 lb

## 2014-01-08 DIAGNOSIS — I10 Essential (primary) hypertension: Secondary | ICD-10-CM | POA: Diagnosis not present

## 2014-01-08 DIAGNOSIS — C189 Malignant neoplasm of colon, unspecified: Secondary | ICD-10-CM | POA: Diagnosis not present

## 2014-01-08 DIAGNOSIS — Z85038 Personal history of other malignant neoplasm of large intestine: Secondary | ICD-10-CM

## 2014-01-08 DIAGNOSIS — C182 Malignant neoplasm of ascending colon: Secondary | ICD-10-CM

## 2014-01-08 DIAGNOSIS — E119 Type 2 diabetes mellitus without complications: Secondary | ICD-10-CM | POA: Diagnosis not present

## 2014-01-08 DIAGNOSIS — Z95828 Presence of other vascular implants and grafts: Secondary | ICD-10-CM

## 2014-01-08 DIAGNOSIS — C7800 Secondary malignant neoplasm of unspecified lung: Principal | ICD-10-CM

## 2014-01-08 MED ORDER — SODIUM CHLORIDE 0.9 % IJ SOLN
10.0000 mL | INTRAMUSCULAR | Status: DC | PRN
  Administered 2014-01-08: 10 mL via INTRAVENOUS
  Filled 2014-01-08: qty 10

## 2014-01-08 MED ORDER — HEPARIN SOD (PORK) LOCK FLUSH 100 UNIT/ML IV SOLN
500.0000 [IU] | Freq: Once | INTRAVENOUS | Status: AC
  Administered 2014-01-08: 500 [IU] via INTRAVENOUS
  Filled 2014-01-08: qty 5

## 2014-01-08 MED ORDER — LIDOCAINE-PRILOCAINE 2.5-2.5 % EX CREA: TOPICAL_CREAM | CUTANEOUS | Status: DC | PRN

## 2014-01-08 NOTE — Progress Notes (Signed)
  [facility] OFFICE PROGRESS NOTE   Diagnosis: Colon cancer  Interval history: Peggy French returns as scheduled. She was recently diagnosed with "shingles "at the left upper leg. The rash has healed. She complains of intermittent "cramps "in the left  Greater than right calf.   Objective:  Vital signs in last 24 hours:  Blood pressure 141/63, pulse 82, temperature 98.2 F (36.8 C), temperature source Oral, resp. rate 20, height 5\' 4"  (1.626 m), weight 209 lb 8 oz (95.029 kg).    HEENT: Neck without mass Lymphatics: No cervical, supra-clavicular, axillary, or inguinal nodes Resp: Lungs clear bilaterally Cardio: Regular rate and rhythm GI: No hepatomegaly, nontender, no mass Vascular: No leg edema, no swelling of the left calf  Skin: Healed rash at the left posterior thigh consistent with a zoster rash   Portacath/PICC-without erythema  Lab Results:   Lab Results  Component Value Date   [name] 3.0 10/09/2013    Imaging:  No results found.  Medications: I have reviewed the patient's current medications.  Assessment/Plan: 1. 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 She began adjuvant CAPOX chemotherapy on 02/24/2012. She completed cycle 8 on 07/27/2012. Negative surveillance colonoscopy 12/08/2012 2. Delayed nausea following cycle 1 CAPOX. Aloxi was added beginning with cycle 2 with improvement. 3. Diabetes. 4. Microcytic anemia. Hemoglobin is normal. She will discontinue iron. 5. Remote history of a "colon tumor" removed endoscopically in 1980. 6. Left lower lung nodule noted on a CT 02/21/2012, slightly larger than on a CT 12/03/2011. Chest CT 06/12/2012 showed the previously seen 5 mm pulmonary nodule in left lower lobe was scarcely visualized, estimated at 3 mm. No other suspicious pulmonary nodules were identified. Chest CT 11/27/2012 showed interval enlargement of the left lower lobe pulmonary nodule with a  current measurement of 7 mm x 8 mm. No new pulmonary nodules. CT 02/22/2013 with enlargement of the anterior left lower lobe nodule and no other lung nodules. PET scan 03/02/2013 showed no associated hypermetabolism with the 12 mm left lower lobe pulmonary nodule. No hypermetabolic lymph nodes in the neck, chest, abdomen or pelvis. No abnormal hypermetabolic activity within the liver, pancreas, adrenal glands or spleen. Abnormal FDG accumulation in the posterior left nasopharyngeal mucosa. Status post a wedge resection of the left lower lobe nodule on 03/27/2013 with the pathology confirming metastatic adenocarcinoma of colorectal primary.  Chest CT 07/18/2013-negative for recurrent disease.  Chest CT 09/19/2013 to evaluate for shortness of breath showed postoperative changes in the left lower lobe without evidence of recurrent or metastatic disease. 7. Status post Port-A-Cath placement 02/22/2012. Status post Port-A-Cath repositioning 03/14/2012. 8. Probable acute laryngopharyngeal dysesthesia following cycle 2 CAPOX. Symptoms resolved with warming.  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 She has persistent neuropathy symptoms involving the feet. 10. History of anterior chest pain-she reports undergoing an evaluation by Dr. Debara Pickett. 11. 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   Disposition:  Peggy French remains in clinical remission from colon cancer. We will followup on the [name] from today. She will return for a Port-A-Cath flush and [name] in 6 weeks. She will be scheduled for a restaging chest CT and office visit in 3 months.  [name], MD  01/08/2014  1:57 PM

## 2014-01-08 NOTE — Telephone Encounter (Signed)
gave pt appt for lab,md and flush for September 2015

## 2014-01-08 NOTE — Patient Instructions (Signed)

## 2014-01-08 NOTE — Progress Notes (Signed)
Denies falls, but has had a couple "near misses". Says her balance is off-more likely due to her medications (she is on several cardiac drugs and gabapentin). Discussed proper shoes and changing positions slowly.

## 2014-01-08 NOTE — Patient Instructions (Signed)
Implanted Port Home Guide  An implanted port is a type of central line that is placed under the skin. Central lines are used to provide IV access when treatment or nutrition needs to be given through a person's veins. Implanted ports are used for long-term IV access. An implanted port may be placed because:    You need IV medicine that would be irritating to the small veins in your hands or arms.    You need long-term IV medicines, such as antibiotics.    You need IV nutrition for a long period.    You need frequent blood draws for lab tests.    You need dialysis.   Implanted ports are usually placed in the chest area, but they can also be placed in the upper arm, the abdomen, or the leg. An implanted port has two main parts:    Reservoir. The reservoir is round and will appear as a small, raised area under your skin. The reservoir is the part where a needle is inserted to give medicines or draw blood.    Catheter. The catheter is a thin, flexible tube that extends from the reservoir. The catheter is placed into a large vein. Medicine that is inserted into the reservoir goes into the catheter and then into the vein.   HOW WILL I CARE FOR MY INCISION SITE?  Do not get the incision site wet. Bathe or shower as directed by your health care Peggy French.   HOW IS MY PORT ACCESSED?  Special steps must be taken to access the port:    Before the port is accessed, a numbing cream can be placed on the skin. This helps numb the skin over the port site.    Your health care Peggy French uses a sterile technique to access the port.   Your health care Peggy French must put on a mask and sterile gloves.   The skin over your port is cleaned carefully with an antiseptic and allowed to dry.   The port is gently pinched between sterile gloves, and a needle is inserted into the port.   Only "non-coring" port needles should be used to access the port. Once the port is accessed, a blood return should be checked. This helps  ensure that the port is in the vein and is not clogged.    If your port needs to remain accessed for a constant infusion, a clear (transparent) bandage will be placed over the needle site. The bandage and needle will need to be changed every week, or as directed by your health care Peggy French.    Keep the bandage covering the needle clean and dry. Do not get it wet. Follow your health care Peggy French's instructions on how to take a shower or bath while the port is accessed.    If your port does not need to stay accessed, no bandage is needed over the port.   WHAT IS FLUSHING?  Flushing helps keep the port from getting clogged. Follow your health care Peggy French's instructions on how and when to flush the port. Ports are usually flushed with saline solution or a medicine called heparin. The need for flushing will depend on how the port is used.    If the port is used for intermittent medicines or blood draws, the port will need to be flushed:    After medicines have been given.    After blood has been drawn.    As part of routine maintenance.    If a constant infusion is   running, the port may not need to be flushed.   HOW LONG WILL MY PORT STAY IMPLANTED?  The port can stay in for as long as your health care Peggy French thinks it is needed. When it is time for the port to come out, surgery will be done to remove it. The procedure is similar to the one performed when the port was put in.   WHEN SHOULD I SEEK IMMEDIATE MEDICAL CARE?  When you have an implanted port, you should seek immediate medical care if:    You notice a bad smell coming from the incision site.    You have swelling, redness, or drainage at the incision site.    You have more swelling or pain at the port site or the surrounding area.    You have a fever that is not controlled with medicine.  Document Released: 06/28/2005 Document Revised: 04/18/2013 Document Reviewed: 03/05/2013  [employer] Patient Information 2015 [employer]. This  information is not intended to replace advice given to you by your health care Peggy French. Make sure you discuss any questions you have with your health care Peggy French.

## 2014-01-09 LAB — CEA: CEA: 2.5 ng/mL (ref 0.0–5.0)

## 2014-01-10 ENCOUNTER — Telehealth: Payer: Self-pay | Admitting: *Deleted

## 2014-01-10 NOTE — Telephone Encounter (Signed)
Called pt with normal [name] result. She voiced appreciation for call.

## 2014-01-10 NOTE — Telephone Encounter (Signed)
Message copied by Brien Few on Thu Jan 10, 2014 11:10 AM ------      Message from: [name] B      Created: Wed Jan 09, 2014  6:39 PM       Please call patient, cea is normal ------

## 2014-01-16 IMAGING — CR DG CHEST 2V
2 series · 2 of 2 positions shown · non-contrast
Comparison: 04/12/2013.

CLINICAL DATA: Status post partial left lung resection.

EXAM:
CHEST  2 VIEW

[w chest pa]
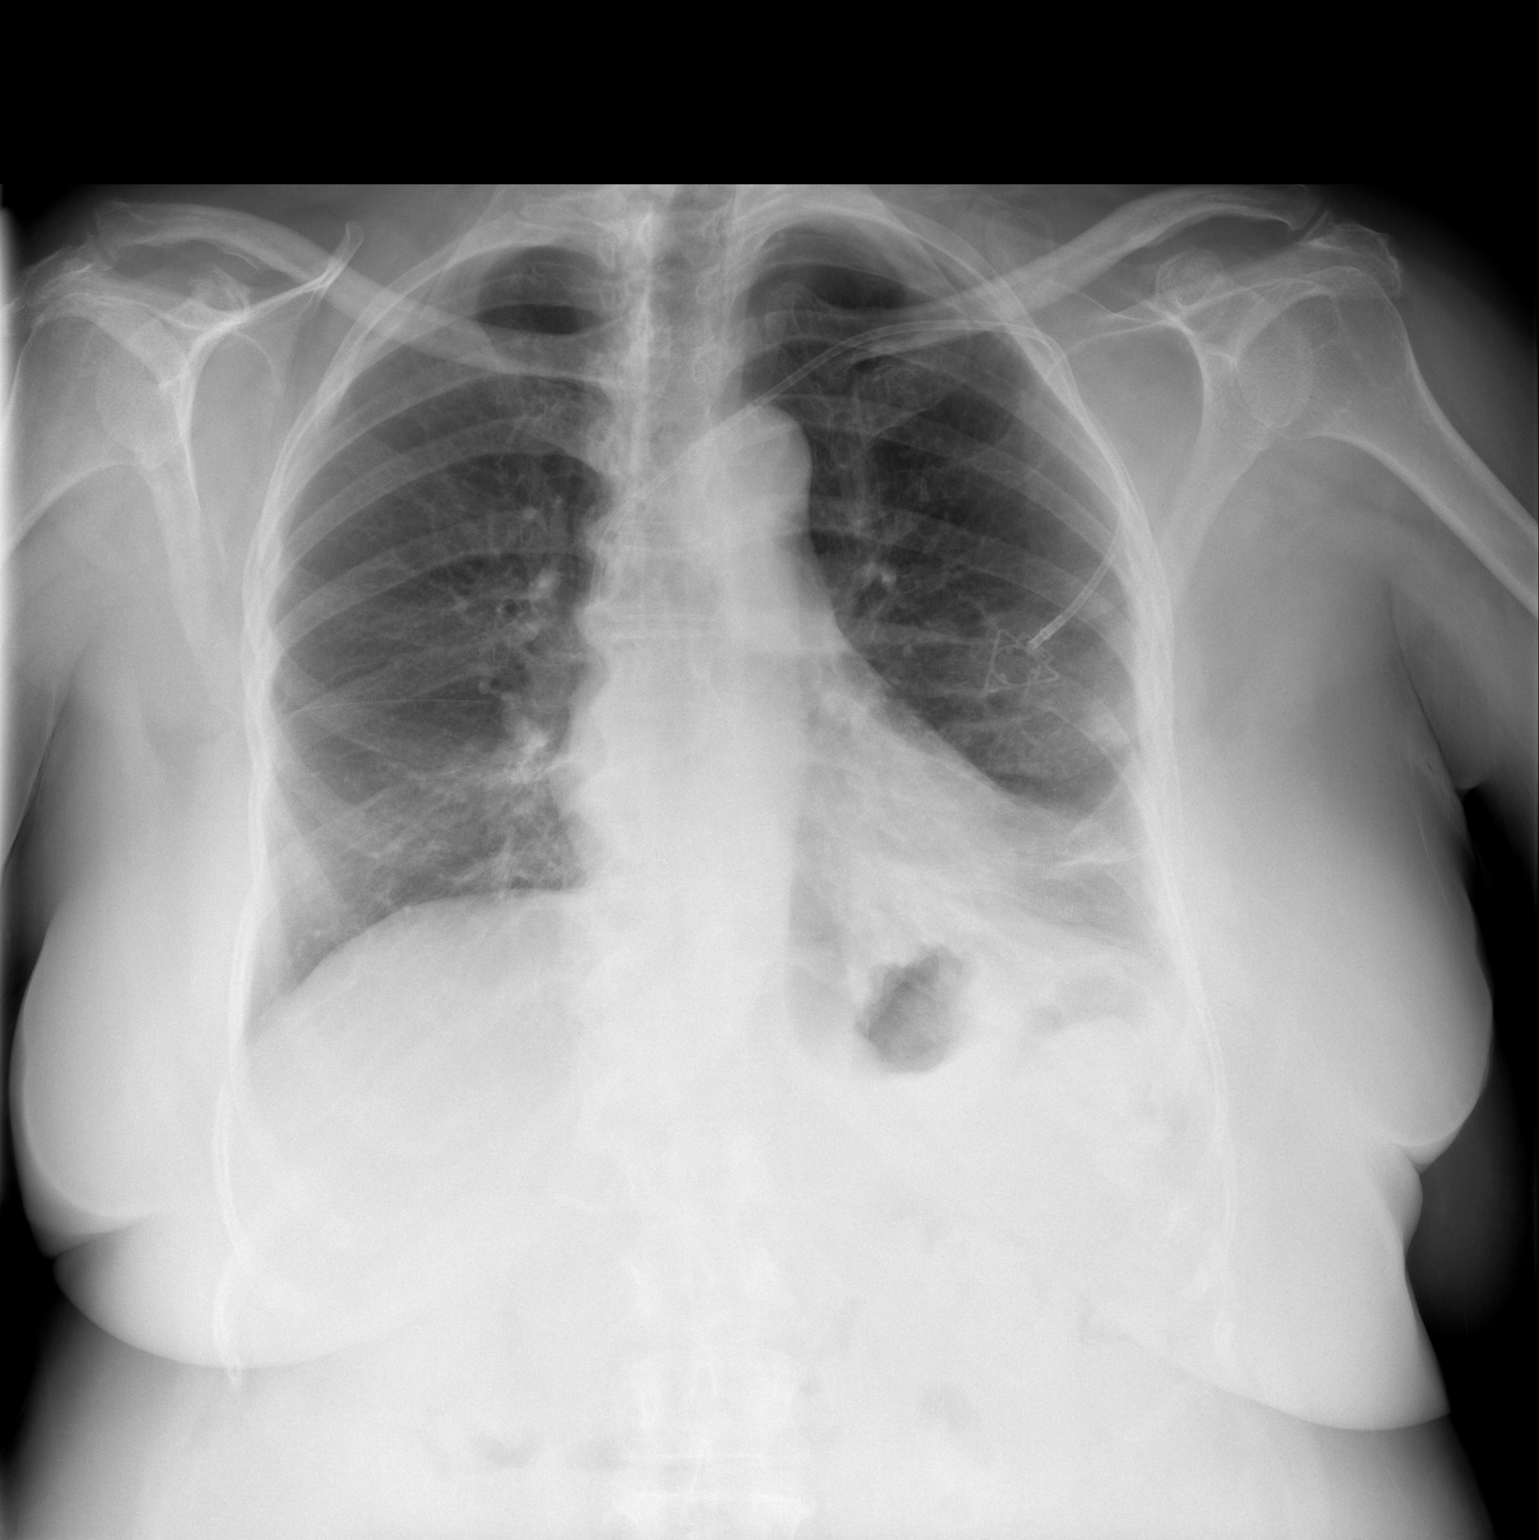

[w chest lat]
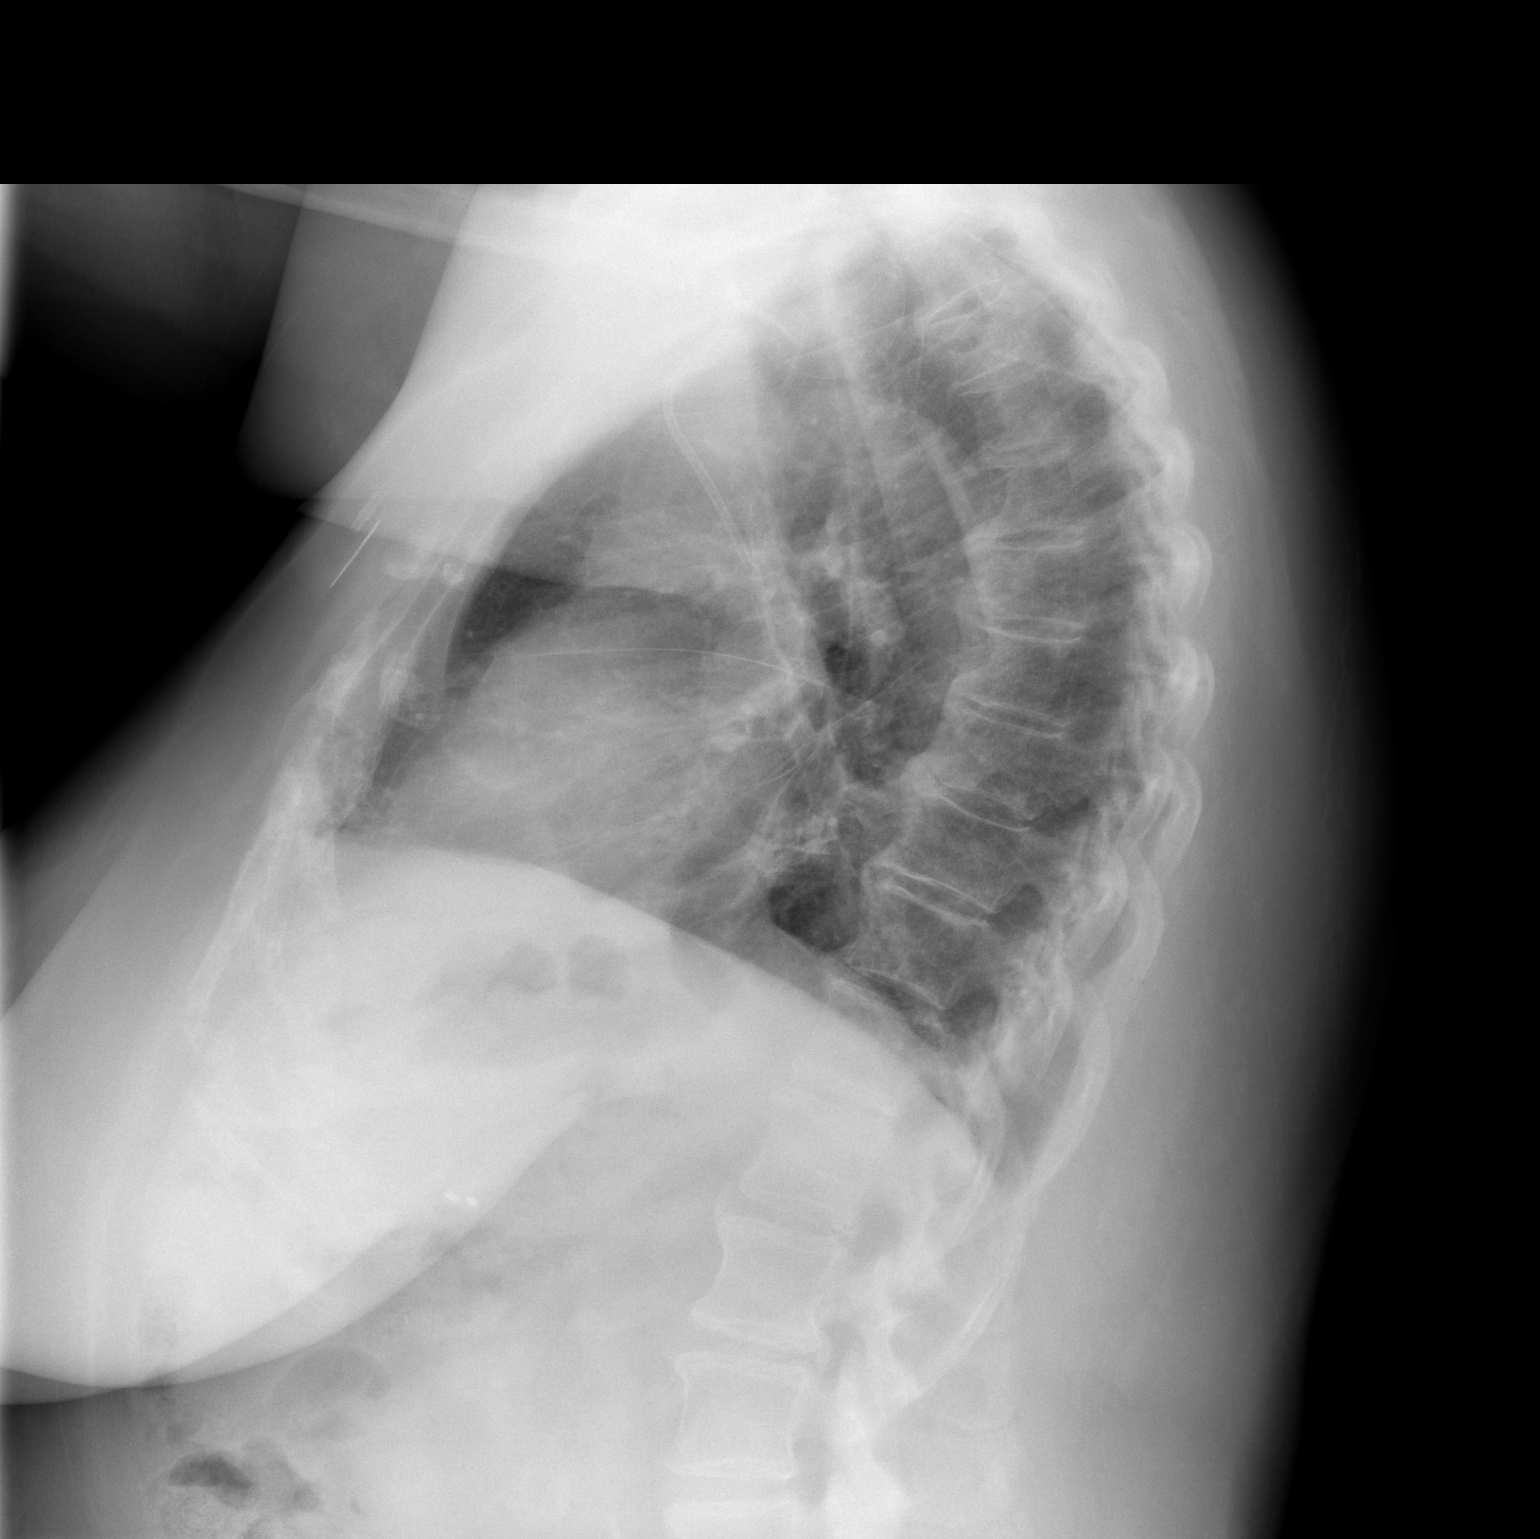

[2 of 2 positions shown; findings below may reference images not displayed]

FINDINGS: Of the Port-A-Cath is stable. The cardiac silhouette, mediastinal
and hilar contours are within normal limits and unchanged. There is
persistent left basilar atelectasis or scarring change. The left
pleural effusion has resolved. No pulmonary edema or pneumothorax.
IMPRESSION: Probable postoperative scarring changes involving the left lower
lobe

Resolution of left pleural effusion.

## 2014-01-31 DIAGNOSIS — M79609 Pain in unspecified limb: Secondary | ICD-10-CM | POA: Diagnosis not present

## 2014-01-31 DIAGNOSIS — G2581 Restless legs syndrome: Secondary | ICD-10-CM | POA: Diagnosis not present

## 2014-01-31 DIAGNOSIS — I10 Essential (primary) hypertension: Secondary | ICD-10-CM | POA: Diagnosis not present

## 2014-01-31 DIAGNOSIS — E119 Type 2 diabetes mellitus without complications: Secondary | ICD-10-CM | POA: Diagnosis not present

## 2014-02-02 ENCOUNTER — Encounter (HOSPITAL_COMMUNITY): Payer: Self-pay | Admitting: Emergency Medicine

## 2014-02-02 ENCOUNTER — Emergency Department (HOSPITAL_COMMUNITY)
Admission: EM | Admit: 2014-02-02 | Discharge: 2014-02-03 | Disposition: A | Discharge status: Home / Self Care | Payer: Medicare Other | Attending: Emergency Medicine | Admitting: Emergency Medicine

## 2014-02-02 ENCOUNTER — Emergency Department (HOSPITAL_COMMUNITY): Payer: Medicare Other

## 2014-02-02 DIAGNOSIS — T63461A Toxic effect of venom of wasps, accidental (unintentional), initial encounter: Secondary | ICD-10-CM | POA: Diagnosis not present

## 2014-02-02 DIAGNOSIS — K219 Gastro-esophageal reflux disease without esophagitis: Secondary | ICD-10-CM | POA: Insufficient documentation

## 2014-02-02 DIAGNOSIS — M129 Arthropathy, unspecified: Secondary | ICD-10-CM | POA: Diagnosis not present

## 2014-02-02 DIAGNOSIS — Z85118 Personal history of other malignant neoplasm of bronchus and lung: Secondary | ICD-10-CM | POA: Insufficient documentation

## 2014-02-02 DIAGNOSIS — T63464A Toxic effect of venom of wasps, undetermined, initial encounter: Secondary | ICD-10-CM

## 2014-02-02 DIAGNOSIS — I2510 Atherosclerotic heart disease of native coronary artery without angina pectoris: Secondary | ICD-10-CM | POA: Insufficient documentation

## 2014-02-02 DIAGNOSIS — G4730 Sleep apnea, unspecified: Secondary | ICD-10-CM | POA: Diagnosis not present

## 2014-02-02 DIAGNOSIS — Y9302 Activity, running: Secondary | ICD-10-CM | POA: Insufficient documentation

## 2014-02-02 DIAGNOSIS — E785 Hyperlipidemia, unspecified: Secondary | ICD-10-CM | POA: Diagnosis not present

## 2014-02-02 DIAGNOSIS — T6391XA Toxic effect of contact with unspecified venomous animal, accidental (unintentional), initial encounter: Secondary | ICD-10-CM | POA: Insufficient documentation

## 2014-02-02 DIAGNOSIS — W1809XA Striking against other object with subsequent fall, initial encounter: Secondary | ICD-10-CM | POA: Diagnosis not present

## 2014-02-02 DIAGNOSIS — Z85038 Personal history of other malignant neoplasm of large intestine: Secondary | ICD-10-CM | POA: Insufficient documentation

## 2014-02-02 DIAGNOSIS — S1093XA Contusion of unspecified part of neck, initial encounter: Principal | ICD-10-CM

## 2014-02-02 DIAGNOSIS — S0990XA Unspecified injury of head, initial encounter: Secondary | ICD-10-CM | POA: Diagnosis not present

## 2014-02-02 DIAGNOSIS — Y929 Unspecified place or not applicable: Secondary | ICD-10-CM | POA: Diagnosis not present

## 2014-02-02 DIAGNOSIS — S0083XA Contusion of other part of head, initial encounter: Principal | ICD-10-CM | POA: Insufficient documentation

## 2014-02-02 DIAGNOSIS — Z9889 Other specified postprocedural states: Secondary | ICD-10-CM | POA: Insufficient documentation

## 2014-02-02 DIAGNOSIS — S0003XA Contusion of scalp, initial encounter: Secondary | ICD-10-CM | POA: Insufficient documentation

## 2014-02-02 DIAGNOSIS — Z9981 Dependence on supplemental oxygen: Secondary | ICD-10-CM | POA: Insufficient documentation

## 2014-02-02 DIAGNOSIS — I10 Essential (primary) hypertension: Secondary | ICD-10-CM | POA: Diagnosis not present

## 2014-02-02 DIAGNOSIS — J45909 Unspecified asthma, uncomplicated: Secondary | ICD-10-CM | POA: Insufficient documentation

## 2014-02-02 DIAGNOSIS — Z79899 Other long term (current) drug therapy: Secondary | ICD-10-CM | POA: Insufficient documentation

## 2014-02-02 DIAGNOSIS — I252 Old myocardial infarction: Secondary | ICD-10-CM | POA: Insufficient documentation

## 2014-02-02 DIAGNOSIS — E119 Type 2 diabetes mellitus without complications: Secondary | ICD-10-CM | POA: Diagnosis not present

## 2014-02-02 NOTE — ED Notes (Signed)
Patient states she was running from a yellow jacket. The yellow jacket stung patient on the bridge of nose, the patient tripped and fell against wall. Patient has hematoma to right side of head. Patient is A&OX4. Able to ambulate and follow commands.

## 2014-02-02 NOTE — ED Provider Notes (Signed)
CSN: [id number]     Arrival date & time 02/02/14  2312 History   First MD Initiated Contact with Patient 02/02/14 2331    This chart was scribed for Merryl Hacker, MD by Terressa Koyanagi, ED Scribe. This patient was seen in room APA14/APA14 and the patient's care was started at 11:35 PM.  Chief Complaint  Patient presents with  . Headache  PCP: Maggie Font, MD  The history is provided by the patient. No language interpreter was used.   HPI Comments: Peggy French is a 73 y.o. female who presents to the Emergency Department complaining of a fall sustained earlier today. Pt also complains of associated injury to her head and nose onset earlier today. Pt states that she was running from some bees today when she fell and hit her head and nose on the side railing of a door frame Pt reports she has been ambulatory since the fall. Pt denies LOC, neck pain, being on any blood thinners.Pt denies any other pain.   Past Medical History  Diagnosis Date  . Gout   . Diabetes mellitus     x over 10 yrs  . Hyperlipidemia   . GERD (gastroesophageal reflux disease)   . Chest pain   . H/O hiatal hernia   . Headache(784.0)     hx migraines  . Arthritis   . Sleep apnea     does not have CPAP machine  sleep study done on 05/19/2011  AHI during total sleep time (3h 53 min) 13.1/hr and REM sleep at 46.7/hr  . Cancer     LOV Dr Benay Spice 02/14/12 [facility]  . Asthmatic bronchitis 2011  . Hypertension     EKG 10/12 [facility], chest- 1 view  6/13 [facility]    Last 2D Echo on 05/02/2012 showed EF of greater than 55%  . Coronary artery disease   . Myocardial infarction     2001  . 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 Cholecystectomy    . Abdominal hysterectomy    . Colon surgery      Tumor  . Esophagogastroduodenoscopy  06/16/2011    Procedure: ESOPHAGOGASTRODUODENOSCOPY (EGD);  Surgeon: Rogene Houston, MD;  Location: [facility];  Service:  Endoscopy;  Laterality: N/A;  2:15  . Colonoscopy  11/26/2011    Procedure: COLONOSCOPY;  Surgeon: Rogene Houston, MD;  Location: [facility];  Service: Endoscopy;  Laterality: N/A;  2:15  . Appendectomy    . Cardiac catheterization  2001,2012  . Portacath placement  02/22/2012    Procedure: INSERTION PORT-A-CATH;  Surgeon: Pedro Earls, MD;  Location: [facility];  Service: General;  Laterality: N/A;  left subclavian  . Colonoscopy N/A 12/08/2012    Procedure: COLONOSCOPY;  Surgeon: Rogene Houston, MD;  Location: [facility];  Service: Endoscopy;  Laterality: N/A;  815-moved to 0950 Ann to notify pt  . Video bronchoscopy N/A 03/27/2013    Procedure: VIDEO BRONCHOSCOPY;  Surgeon: Grace Isaac, MD;  Location: [facility] OR;  Service: Thoracic;  Laterality: N/A;  . Video assisted thoracoscopy (vats)/wedge resection Left 03/27/2013    Procedure: VIDEO ASSISTED THORACOSCOPY (VATS)/WEDGE RESECTION;  Surgeon: Grace Isaac, MD;  Location: [place];  Service: Thoracic;  Laterality: Left;   Family History  Problem Relation Age of Onset  . Cancer Maternal Aunt     throat,    History  Substance Use Topics  . Smoking status: Never Smoker   .  Smokeless tobacco: Never Used  . Alcohol Use: No   OB History   Grav Para Term Preterm Abortions TAB SAB Ect Mult Living                 Review of Systems  Eyes: Negative for visual disturbance.  Respiratory: Negative for chest tightness and shortness of breath.   Cardiovascular: Negative for chest pain.  Gastrointestinal: Negative for abdominal pain.  Skin: Positive for wound.  Neurological: Positive for headaches. Negative for dizziness, syncope and weakness.  All other systems reviewed and are negative.     Allergies  Aspirin  Home Medications   Prior to Admission medications   Medication Sig Start Date End Date Taking? Authorizing Provider  acetaminophen (TYLENOL) 500 MG tablet Take 1,000 mg by mouth every 6 (six) hours as needed. For pain    Yes Historical Provider, MD  amLODipine (NORVASC) 5 MG tablet Take 5 mg by mouth daily as needed.    Yes Historical Provider, MD  carboxymethylcellulose (REFRESH PLUS) 0.5 % SOLN Place 2 drops into both eyes daily as needed (dry eyes).   Yes Historical Provider, MD  esomeprazole (NEXIUM) 40 MG capsule Take 40 mg by mouth every evening. 07/16/11  Yes Rogene Houston, MD  fexofenadine (ALLEGRA) 180 MG tablet Take 180 mg by mouth daily as needed. For Allergies   Yes Historical Provider, MD  gabapentin (NEURONTIN) 100 MG capsule Take 300 mg by mouth 4 (four) times daily.    Yes Historical Provider, MD  lidocaine-prilocaine (EMLA) cream Apply topically as needed. Apply to port-a-cath 1-2 hours prior to chemo.  Cover with plastic wrap 01/08/14  Yes Ladell Pier, MD  linagliptin (TRADJENTA) 5 MG TABS tablet Take 5 mg by mouth daily after breakfast.    Yes Historical Provider, MD  propranolol-hydrochlorothiazide (INDERIDE) 40-25 MG per tablet Take 1 tablet by mouth daily with breakfast. 04/06/13  Yes Donielle Liston Alba, PA-C  ramipril (ALTACE) 5 MG capsule Take 5 mg by mouth daily with breakfast.    Yes Historical Provider, MD  simvastatin (ZOCOR) 40 MG tablet Take 40 mg by mouth at bedtime.    Yes Historical Provider, MD  traMADol (ULTRAM) 50 MG tablet Take 1 tablet (50 mg total) by mouth every 8 (eight) hours as needed for pain. 03/31/13   Donielle Liston Alba, PA-C   Triage Vitals: BP 156/81  Pulse 90  Temp(Src) 97.8 F (36.6 C) (Oral)  Resp 18  Ht 5\' 4"  (1.626 m)  Wt 208 lb (94.348 kg)  BMI 35.69 kg/m2  SpO2 100% Physical Exam  Nursing note and vitals reviewed. Constitutional: She is oriented to person, place, and time. She appears well-developed and well-nourished. No distress.  HENT:  Hematoma noted over the right temporoparietal region, no overlying abrasion or laceration, mild erythema over the bridge of the nose  Eyes: EOM are normal. Pupils are equal, round, and reactive to light.   Neck: Normal range of motion. Neck supple.  No midline tenderness to palpation  Cardiovascular: Normal rate, regular rhythm and normal heart sounds.   Pulmonary/Chest: Effort normal. No respiratory distress. She has no wheezes.  Musculoskeletal: Normal range of motion. She exhibits no edema.  Neurological: She is alert and oriented to person, place, and time.  Skin: Skin is warm and dry.  Psychiatric: She has a normal mood and affect.    ED Course  Procedures (including critical care time) DIAGNOSTIC STUDIES: Oxygen Saturation is 100% on RA, nl by my interpretation.  COORDINATION OF CARE: 11:38 PM-Discussed treatment plan which includes imaging with pt at bedside and pt agreed to plan.   Labs Review Labs Reviewed - No data to display  Imaging Review Ct Head Wo Contrast  02/03/2014   CLINICAL DATA:  Head injury.  EXAM: CT HEAD WITHOUT CONTRAST  TECHNIQUE: Contiguous axial images were obtained from the base of the skull through the vertex without intravenous contrast.  COMPARISON:  09/03/2008.  FINDINGS: There is no evidence for acute hemorrhage, hydrocephalus, mass lesion, or abnormal extra-axial fluid collection. No definite CT evidence for acute infarction. The visualized paranasal sinuses and mastoid air cells are clear.  IMPRESSION: Stable.  No acute intracranial abnormality.   Electronically Signed   By: Misty Stanley M.D.   On: 02/03/2014 00:20     EKG Interpretation None      MDM   Final diagnoses:  Scalp hematoma, initial encounter  Wasp sting, undetermined intent, initial encounter    Patient presents following a fall. Patient reports a mechanical fall as she was being chased by a wasp. She was stung on the nose.  Denies LOC or being on anticoagulants.  Given hematoma, will obtain CT scan of the head. C-spine cleared by Nexus criteria. CT scan negative for intracranial bleed.  Patient has been ambulatory and without evidence of other injury.  After history, exam,  and medical workup I feel the patient has been appropriately medically screened and is safe for discharge home. Pertinent diagnoses were discussed with the patient. Patient was given return precautions.  I personally performed the services described in this documentation, which was scribed in my presence. The recorded information has been reviewed and is accurate.     Merryl Hacker, MD 02/03/14 [phone]

## 2014-02-02 NOTE — ED Notes (Signed)
Pt states she was running from some bees & hit her head on the door facing. Denies any LOC.

## 2014-02-03 DIAGNOSIS — S0990XA Unspecified injury of head, initial encounter: Secondary | ICD-10-CM | POA: Diagnosis not present

## 2014-02-03 NOTE — Discharge Instructions (Signed)

## 2014-02-03 NOTE — ED Notes (Signed)
Patient verbalizes understanding of discharge instructions, home care, and follow up information with PCP. Patient ambulatory out of department at this time escorted by family.

## 2014-02-04 ENCOUNTER — Ambulatory Visit: Payer: Medicare Other | Admitting: Nurse Practitioner

## 2014-02-05 DIAGNOSIS — I10 Essential (primary) hypertension: Secondary | ICD-10-CM | POA: Diagnosis not present

## 2014-02-05 DIAGNOSIS — G609 Hereditary and idiopathic neuropathy, unspecified: Secondary | ICD-10-CM | POA: Diagnosis not present

## 2014-02-05 DIAGNOSIS — IMO0001 Reserved for inherently not codable concepts without codable children: Secondary | ICD-10-CM | POA: Diagnosis not present

## 2014-02-06 DIAGNOSIS — S0990XA Unspecified injury of head, initial encounter: Secondary | ICD-10-CM | POA: Diagnosis not present

## 2014-02-07 ENCOUNTER — Ambulatory Visit: Payer: Medicare Other | Admitting: Cardiothoracic Surgery

## 2014-02-08 DIAGNOSIS — E7800 Pure hypercholesterolemia, unspecified: Secondary | ICD-10-CM | POA: Diagnosis not present

## 2014-02-08 DIAGNOSIS — I10 Essential (primary) hypertension: Secondary | ICD-10-CM | POA: Diagnosis not present

## 2014-02-08 DIAGNOSIS — G609 Hereditary and idiopathic neuropathy, unspecified: Secondary | ICD-10-CM | POA: Diagnosis not present

## 2014-02-08 DIAGNOSIS — IMO0001 Reserved for inherently not codable concepts without codable children: Secondary | ICD-10-CM | POA: Diagnosis not present

## 2014-02-14 ENCOUNTER — Encounter: Payer: Self-pay | Admitting: Cardiothoracic Surgery

## 2014-02-14 ENCOUNTER — Ambulatory Visit (INDEPENDENT_AMBULATORY_CARE_PROVIDER_SITE_OTHER): Payer: Medicare Other | Admitting: Cardiothoracic Surgery

## 2014-02-14 VITALS — BP 136/81 | HR 77 | Ht 64.0 in | Wt 208.0 lb

## 2014-02-14 DIAGNOSIS — C3490 Malignant neoplasm of unspecified part of unspecified bronchus or lung: Secondary | ICD-10-CM | POA: Diagnosis not present

## 2014-02-14 NOTE — Progress Notes (Signed)
[address]       [place] [address]             [phone]                     [facility] Medical Record #[id number] Date of Birth: 1940-11-07  Peggy Beard, MD Maggie Font, MD  Chief Complaint:  Follow up visit 03/27/2013  PREOPERATIVE DIAGNOSIS: New left lung mass.  POSTOPERATIVE DIAGNOSIS: New left lung mass, adenocarcinoma by frozen  section, question colon metastasis versus primary tumor.  PROCEDURE PERFORMED: Bronchoscopy, left video-assisted thoracoscopy,  wedge resection, superior segment left lower lobe, and lymph node  dissection.   PATH: 1. Lung, wedge biopsy/resection, Inferior segment of left lower lobe - METASTATIC ADENOCARCINOMA OF COLORECTAL PRIMARY SOURCE, 1.7 CM. - MARGIN IS NEGATIVE. - SEE COMMENT. 2. Lymph node, biopsy, 4 L - ONE BENIGN LYMPH NODE WITH NO TUMOR SEEN (0/1).   History of Present Illness:     Patient doing well following wedge resection of a metastatic adenocarcinoma of colorectal source in September of 2014.  She's returned to normal activities without shortness of breath. She does note an occasional wheeze. She is up-to-date on pneumococcal and flu vaccination. She notes recent CT of the head secondary to trauma, she was running from a yellow jacket and ran into the wall and hit her head. She notes since her chest surgery she's returned to near normal activities including singing in the choir.   She is scheduled for a CT in September which will be approximately one year postop  Zubrod Score: At the time of visit  this patient's most appropriate activity status/level should be described as: [x]     0    Normal activity, no symptoms []     1    Restricted in physical strenuous activity but ambulatory, able to do out light work []     2    Ambulatory and capable of self care, unable to do work activities, up and about >50 % of waking hours                              []     3    Only limited self  care, in bed greater than 50% of waking hours []     4    Completely disabled, no self care, confined to bed or chair []     5    Moribund   History  Smoking status  . Never Smoker   Smokeless tobacco  . Never Used       Allergies  Allergen Reactions  . Aspirin Palpitations    Current Outpatient Prescriptions  Medication Sig Dispense Refill  . acetaminophen (TYLENOL) 500 MG tablet Take 1,000 mg by mouth every 6 (six) hours as needed. For pain      . amLODipine (NORVASC) 5 MG tablet Take 5 mg by mouth daily as needed.       . carboxymethylcellulose (REFRESH PLUS) 0.5 % SOLN Place 2 drops into both eyes daily as needed (dry eyes).      Marland Kitchen esomeprazole (NEXIUM) 40 MG capsule Take 40 mg by mouth every evening.      . fexofenadine (ALLEGRA) 180 MG tablet Take 180 mg by mouth daily as needed. For Allergies      . gabapentin (NEURONTIN) 100 MG capsule Take 300 mg by mouth 4 (four) times daily.       Marland Kitchen  lidocaine-prilocaine (EMLA) cream Apply topically as needed. Apply to port-a-cath 1-2 hours prior to chemo.  Cover with plastic wrap  30 g  0  . linagliptin (TRADJENTA) 5 MG TABS tablet Take 5 mg by mouth daily after breakfast.       . propranolol-hydrochlorothiazide (INDERIDE) 40-25 MG per tablet Take 1 tablet by mouth daily with breakfast.      . ramipril (ALTACE) 5 MG capsule Take 5 mg by mouth daily with breakfast.       . simvastatin (ZOCOR) 40 MG tablet Take 40 mg by mouth at bedtime.        No current facility-administered medications for this visit.   Facility-Administered Medications Ordered in Other Visits  Medication Dose Route Frequency Provider Last Rate Last Dose  . sodium chloride 0.9 % injection 10 mL  10 mL Intracatheter PRN Ladell Pier, MD           Physical Exam: BP 136/81  Pulse 77  Ht 5\' 4"  (1.626 m)  Wt 208 lb (94.348 kg)  BMI 35.69 kg/m2  SpO2 92%  General appearance: alert and cooperative Neurologic: intact Heart: regular rate and rhythm, S1, S2  normal, no murmur, click, rub or gallop Lungs: clear to auscultation bilaterally and normal percussion bilaterally Abdomen: soft, non-tender; bowel sounds normal; no masses,  no organomegaly Extremities: extremities normal, atraumatic, no cyanosis or edema and Homans sign is negative, no sign of DVT Wound: Now well healed, without palpable masses, no significant keloid Patient has no cervical or supraclavicular adenopathy Port site is well-healed without evidence of infection  Diagnostic Studies & Laboratory data:         Recent Radiology Findings: Ct Head Wo Contrast  02/03/2014   CLINICAL DATA:  Head injury.  EXAM: CT HEAD WITHOUT CONTRAST  TECHNIQUE: Contiguous axial images were obtained from the base of the skull through the vertex without intravenous contrast.  COMPARISON:  09/03/2008.  FINDINGS: There is no evidence for acute hemorrhage, hydrocephalus, mass lesion, or abnormal extra-axial fluid collection. No definite CT evidence for acute infarction. The visualized paranasal sinuses and mastoid air cells are clear.  IMPRESSION: Stable.  No acute intracranial abnormality.   Electronically Signed   By: Misty Stanley M.D.   On: 02/03/2014 00:20  Ct Chest Wo Contrast  07/18/2013   CLINICAL DATA:  Colon cancer.  EXAM: CT CHEST WITHOUT CONTRAST  TECHNIQUE: Multidetector CT imaging of the chest was performed following the standard protocol without IV contrast.  COMPARISON:  02/22/2013  FINDINGS: The tip of the left Port-A-Cath is positioned at the junction of the SVC and RA. There is no axillary lymphadenopathy. No mediastinal or hilar lymphadenopathy. Heart size is normal. Coronary artery calcification is noted. No pericardial or pleural effusion.  Lung windows reveal no worrisome nodule or mass. Surgical suture material is identified in the left lower lobe, at the location that the nodule was seen previously. This represents the 1st post operative CT exam and linear soft tissue attenuation associated  with the suture line is felt to be most consistent with scarring. No new parenchymal nodule or mass is evident.  Bone windows reveal no worrisome lytic or sclerotic osseous lesions.  IMPRESSION: Interval resection of the left lower lobe pulmonary nodule with suture line and probable scar at this location today. No new or progressive disease in the chest.   Electronically Signed   By: Misty Stanley M.D.   On: 07/18/2013 13:18    Recent Labs: Lab Results  Component Value  Date   WBC 5.1 07/18/2013   HGB 12.2 07/18/2013   HCT 36.8 07/18/2013   PLT 208 07/18/2013   GLUCOSE 74 08/13/2013   CHOL 163 04/02/2011   TRIG 57 04/02/2011   HDL 60 04/02/2011   LDLCALC 92 04/02/2011   ALT 10 07/18/2013   AST 19 07/18/2013   NA 142 08/13/2013   K 4.0 08/13/2013   CL 102 08/13/2013   CREATININE 0.85 08/13/2013   BUN 14 08/13/2013   CO2 31 08/13/2013   TSH 1.908 04/01/2011   INR 0.96 03/23/2013   HGBA1C 6.5* 04/01/2011   Lab Results  Component Value Date   [name] 2.5 01/08/2014      Assessment / Plan:      Stable progress following wedge resection of left lung lesion which was determined to be an isolated nodule of metastatic adenocarcinoma from the colon. CT scan of chest scheduled for September approximately 1 year postop She continues to be followed on a regular basis by oncology, I will leave that to their discretion concerning the timing of further scans Issues closely followed by oncology have not made a return appointment to see me but would be glad to see her at her or oncology requested anytime.    Antwuan Eckley B 02/14/2014 11:40 AM

## 2014-02-21 DIAGNOSIS — S0990XA Unspecified injury of head, initial encounter: Secondary | ICD-10-CM | POA: Diagnosis not present

## 2014-02-21 DIAGNOSIS — M25549 Pain in joints of unspecified hand: Secondary | ICD-10-CM | POA: Diagnosis not present

## 2014-02-21 DIAGNOSIS — G4762 Sleep related leg cramps: Secondary | ICD-10-CM | POA: Diagnosis not present

## 2014-02-21 DIAGNOSIS — E119 Type 2 diabetes mellitus without complications: Secondary | ICD-10-CM | POA: Diagnosis not present

## 2014-04-01 ENCOUNTER — Ambulatory Visit (HOSPITAL_COMMUNITY)
Admission: RE | Admit: 2014-04-01 | Discharge: 2014-04-01 | Disposition: A | Discharge status: Home / Self Care | Payer: Medicare Other | Source: Ambulatory Visit | Attending: Oncology | Admitting: Oncology

## 2014-04-01 ENCOUNTER — Ambulatory Visit (HOSPITAL_BASED_OUTPATIENT_CLINIC_OR_DEPARTMENT_OTHER): Payer: Medicare Other

## 2014-04-01 ENCOUNTER — Other Ambulatory Visit: Payer: Medicare Other

## 2014-04-01 VITALS — BP 114/59 | HR 78 | Temp 98.0°F

## 2014-04-01 DIAGNOSIS — C182 Malignant neoplasm of ascending colon: Secondary | ICD-10-CM | POA: Diagnosis not present

## 2014-04-01 DIAGNOSIS — Z85038 Personal history of other malignant neoplasm of large intestine: Secondary | ICD-10-CM | POA: Diagnosis not present

## 2014-04-01 DIAGNOSIS — Z95828 Presence of other vascular implants and grafts: Secondary | ICD-10-CM

## 2014-04-01 DIAGNOSIS — Z452 Encounter for adjustment and management of vascular access device: Secondary | ICD-10-CM | POA: Diagnosis not present

## 2014-04-01 DIAGNOSIS — C260 Malignant neoplasm of intestinal tract, part unspecified: Secondary | ICD-10-CM | POA: Diagnosis not present

## 2014-04-01 MED ORDER — HEPARIN SOD (PORK) LOCK FLUSH 100 UNIT/ML IV SOLN
500.0000 [IU] | Freq: Once | INTRAVENOUS | Status: AC
  Administered 2014-04-01: 500 [IU] via INTRAVENOUS
  Filled 2014-04-01: qty 5

## 2014-04-01 MED ORDER — SODIUM CHLORIDE 0.9 % IJ SOLN
10.0000 mL | INTRAMUSCULAR | Status: DC | PRN
  Administered 2014-04-01: 10 mL via INTRAVENOUS
  Filled 2014-04-01: qty 10

## 2014-04-01 NOTE — Patient Instructions (Signed)

## 2014-04-02 ENCOUNTER — Ambulatory Visit (HOSPITAL_BASED_OUTPATIENT_CLINIC_OR_DEPARTMENT_OTHER): Payer: Medicare Other | Admitting: Nurse Practitioner

## 2014-04-02 ENCOUNTER — Telehealth: Payer: Self-pay | Admitting: Nurse Practitioner

## 2014-04-02 VITALS — BP 132/62 | HR 79 | Temp 98.5°F | Resp 20 | Ht 64.0 in | Wt 217.1 lb

## 2014-04-02 DIAGNOSIS — C7800 Secondary malignant neoplasm of unspecified lung: Principal | ICD-10-CM

## 2014-04-02 DIAGNOSIS — Z85038 Personal history of other malignant neoplasm of large intestine: Secondary | ICD-10-CM | POA: Diagnosis not present

## 2014-04-02 DIAGNOSIS — C189 Malignant neoplasm of colon, unspecified: Secondary | ICD-10-CM

## 2014-04-02 LAB — CEA: CEA: 2.5 ng/mL (ref 0.0–5.0)

## 2014-04-02 NOTE — Telephone Encounter (Signed)
Pt confirmed labs/ov per 09/22 POF, cld [facility] scheduled pt w/MM for port removal, gave pt AVS...Marland KitchenMarland KitchenKJ

## 2014-04-02 NOTE — Progress Notes (Signed)
[facility] OFFICE PROGRESS NOTE   Diagnosis:  Colon cancer  INTERVAL HISTORY:   Ms. Pederson returns as scheduled. She feels well. She has a good appetite. She is gaining weight. She has occasional dyspnea on exertion. No cough or fever. She remains very active. No change in bowel habits. No bloody or black stools. She has intermittent leg cramps. Feetvcontinue to be painful at nighttime. She continues gabapentin.  Objective:  Vital signs in last 24 hours:  Blood pressure 132/62, pulse 79, temperature 98.5 F (36.9 C), temperature source Oral, resp. rate 20, height 5\' 4"  (1.626 m), weight 217 lb 1.6 oz (98.476 kg).    HEENT: No thrush or ulcers. Lymphatics: No palpable cervical, supraclavicular, axillary or inguinal lymph nodes. Resp: Lungs clear bilaterally. Cardio: Regular rate and rhythm. GI: Abdomen soft and nontender. No hepatomegaly. No mass. Vascular: No leg edema.  Lab Results:  Lab Results  Component Value Date   WBC 5.1 07/18/2013   HGB 12.2 07/18/2013   HCT 36.8 07/18/2013   MCV 81.0 07/18/2013   PLT 208 07/18/2013   NEUTROABS 2.8 07/18/2013   04/01/2014 [name] 2.5.  Imaging:  Ct Chest Wo Contrast  04/01/2014   CLINICAL DATA:  COLON CANCER.  EXAM: CT CHEST WITHOUT CONTRAST  TECHNIQUE: Multidetector CT imaging of the chest was performed following the standard protocol without IV contrast.  COMPARISON:  09/19/2013.  FINDINGS: Soft tissue / Mediastinum: There is no mediastinal, axillary, or hilar lymphadenopathy. Left-sided Port-A-Cath tip is positioned at the mid SVC. Thoracic esophagus is unremarkable. No thoracic aortic aneurysm. Heart size is normal. Coronary artery calcification is noted. No pericardial effusion. A small hiatal hernia is evident.  Lungs / Pleura: No pulmonary parenchymal nodule or mass. Suture material is seen in the left lower lobe. As before, there is mild subpleural reticulation in the lung bases bilaterally. No focal airspace consolidation.   Bones: Bone windows reveal no worrisome lytic or sclerotic osseous lesions.  Upper Abdomen: Visualized portions of the liver and spleen have normal on infused features. No adrenal nodule or mass.  IMPRESSION: 1. Stable exam with postsurgical scarring in the left lower lobe. No evidence for metastatic disease in the chest. 2. Stable appearance of mild subpleural reticulation of the lung bases.   Electronically Signed   By: Misty Stanley M.D.   On: 04/01/2014 14:36    Medications: I have reviewed the patient's current medications.  Assessment/Plan: 1. 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 She began adjuvant CAPOX chemotherapy on 02/24/2012. She completed cycle 8 on 07/27/2012. Negative surveillance colonoscopy 12/08/2012 2. Delayed nausea following cycle 1 CAPOX. Aloxi was added beginning with cycle 2 with improvement. 3. Diabetes. 4. Microcytic anemia. Hemoglobin is normal. She will discontinue iron. 5. Remote history of a "colon tumor" removed endoscopically in 1980. 6. Left lower lung nodule noted on a CT 02/21/2012, slightly larger than on a CT 12/03/2011. Chest CT 06/12/2012 showed the previously seen 5 mm pulmonary nodule in left lower lobe was scarcely visualized, estimated at 3 mm. No other suspicious pulmonary nodules were identified. Chest CT 11/27/2012 showed interval enlargement of the left lower lobe pulmonary nodule with a current measurement of 7 mm x 8 mm. No new pulmonary nodules. CT 02/22/2013 with enlargement of the anterior left lower lobe nodule and no other lung nodules. PET scan 03/02/2013 showed no associated hypermetabolism with the 12 mm left lower lobe pulmonary nodule. No hypermetabolic lymph nodes in the neck, chest, abdomen  or pelvis. No abnormal hypermetabolic activity within the liver, pancreas, adrenal glands or spleen. Abnormal FDG accumulation in the posterior left nasopharyngeal mucosa. Status post a wedge resection of  the left lower lobe nodule on 03/27/2013 with the pathology confirming metastatic adenocarcinoma of colorectal primary.  Chest CT 07/18/2013-negative for recurrent disease.  Chest CT 09/19/2013 to evaluate for shortness of breath showed postoperative changes in the left lower lobe without evidence of recurrent or metastatic disease. CT chest 04/01/2014 with postsurgical scarring in the left lower lobe. No evidence for metastatic disease in the chest. Stable appearance of mild subpleural reticulation of the lung bases. 7. Status post Port-A-Cath placement 02/22/2012. Status post Port-A-Cath repositioning 03/14/2012. 8. Probable acute laryngopharyngeal dysesthesia following cycle 2 CAPOX. Symptoms resolved with warming.  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 She has persistent neuropathy symptoms involving the feet. 10. History of anterior chest pain-she reports undergoing an evaluation by Dr. Debara Pickett. 11. 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   Disposition: Ms. Pluta appears stable. She remains in remission from colon cancer. Next restaging CT evaluation will be at a six-month interval.  She will return for a followup visit and [name] in 3 months.  We made a referral to Dr. Hassell Done for removal of the Port-A-Cath.  Plan reviewed with [name].  Ned Card ANP/GNP-BC   04/02/2014  11:26 AM

## 2014-04-26 DIAGNOSIS — E119 Type 2 diabetes mellitus without complications: Secondary | ICD-10-CM | POA: Diagnosis not present

## 2014-04-26 DIAGNOSIS — I10 Essential (primary) hypertension: Secondary | ICD-10-CM | POA: Diagnosis not present

## 2014-04-26 DIAGNOSIS — Z23 Encounter for immunization: Secondary | ICD-10-CM | POA: Diagnosis not present

## 2014-05-03 ENCOUNTER — Ambulatory Visit (INDEPENDENT_AMBULATORY_CARE_PROVIDER_SITE_OTHER): Payer: Self-pay | Admitting: Surgery

## 2014-05-03 DIAGNOSIS — Z95828 Presence of other vascular implants and grafts: Secondary | ICD-10-CM | POA: Diagnosis not present

## 2014-05-03 NOTE — H&P (Signed)
Chief Complaint:  Left subclavian portacath  History of Present Illness:  Peggy French is an 73 y.o. female who has a right colectomy for adenocarcinoma in 2013 and a left wedge lung resection for adeno met to her lung in 2014.  Now for removal of portacath.    Past Medical History  Diagnosis Date  . Gout   . Diabetes mellitus     x over 10 yrs  . Hyperlipidemia   . GERD (gastroesophageal reflux disease)   . Chest pain   . H/O hiatal hernia   . Headache(784.0)     hx migraines  . Arthritis   . Sleep apnea     does not have CPAP machine  sleep study done on 05/19/2011  AHI during total sleep time (3h 53 min) 13.1/hr and REM sleep at 46.7/hr  . Cancer     LOV Dr Benay Spice 02/14/12 [facility]  . Asthmatic bronchitis 2011  . Hypertension     EKG 10/12 [facility], chest- 1 view  6/13 [facility]    Last 2D Echo on 05/02/2012 showed EF of greater than 55%  . Coronary artery disease   . Myocardial infarction     2001  . 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 Cholecystectomy    . Abdominal hysterectomy    . Colon surgery      Tumor  . Esophagogastroduodenoscopy  06/16/2011    Procedure: ESOPHAGOGASTRODUODENOSCOPY (EGD);  Surgeon: Rogene Houston, MD;  [facility];  Service: Endoscopy;  Laterality: N/A;  2:15  . Colonoscopy  11/26/2011    Procedure: COLONOSCOPY;  Surgeon: Rogene Houston, MD;  [facility];  Service: Endoscopy;  Laterality: N/A;  2:15  . Appendectomy    . Cardiac catheterization  2001,2012  . Portacath placement  02/22/2012    Procedure: INSERTION PORT-A-CATH;  Surgeon: Pedro Earls, MD;  [facility]: [facility];  Service: General;  Laterality: N/A;  left subclavian  . Colonoscopy N/A 12/08/2012    Procedure: COLONOSCOPY;  Surgeon: Rogene Houston, MD;  [facility];  Service: Endoscopy;  Laterality: N/A;  815-moved to 0950 Ann to notify pt  . Video bronchoscopy N/A 03/27/2013    Procedure:  VIDEO BRONCHOSCOPY;  Surgeon: Grace Isaac, MD;  [facility] OR;  Service: Thoracic;  Laterality: N/A;  . Video assisted thoracoscopy (vats)/wedge resection Left 03/27/2013    Procedure: VIDEO ASSISTED THORACOSCOPY (VATS)/WEDGE RESECTION;  Surgeon: Grace Isaac, MD;  Location: [facility];  Service: Thoracic;  Laterality: Left;    Current Outpatient Prescriptions  Medication Sig Dispense Refill  . acetaminophen (TYLENOL) 500 MG tablet Take 1,000 mg by mouth every 6 (six) hours as needed. For pain      . amLODipine (NORVASC) 5 MG tablet Take 5 mg by mouth daily as needed.       . carboxymethylcellulose (REFRESH PLUS) 0.5 % SOLN Place 2 drops into both eyes daily as needed (dry eyes).      Marland Kitchen esomeprazole (NEXIUM) 40 MG capsule Take 40 mg by mouth every evening.      . fexofenadine (ALLEGRA) 180 MG tablet Take 180 mg by mouth daily as needed. For Allergies      . gabapentin (NEURONTIN) 100 MG capsule Take 300 mg by mouth 3 (three) times daily.       Marland Kitchen lidocaine-prilocaine (EMLA) cream Apply topically as needed. Apply to port-a-cath 1-2 hours prior to chemo.  Cover  with plastic wrap  30 g  0  . linagliptin (TRADJENTA) 5 MG TABS tablet Take 5 mg by mouth daily after breakfast.       . propranolol-hydrochlorothiazide (INDERIDE) 40-25 MG per tablet Take 1 tablet by mouth daily with breakfast.      . ramipril (ALTACE) 5 MG capsule Take 5 mg by mouth daily with breakfast.       . simvastatin (ZOCOR) 40 MG tablet Take 40 mg by mouth at bedtime.        No current facility-administered medications for this visit.   Facility-Administered Medications Ordered in Other Visits  Medication Dose Route Frequency Provider Last Rate Last Dose  . sodium chloride 0.9 % injection 10 mL  10 mL Intracatheter PRN Ladell Pier, MD       Aspirin Family History  Problem Relation Age of Onset  . Cancer Maternal Aunt     throat,    Social History:   reports that she has never smoked. She has never used  smokeless tobacco. She reports that she does not drink alcohol or use illicit drugs.   REVIEW OF SYSTEMS : Negative except for as seen in HPI  Physical Exam:   There were no vitals taken for this visit. There is no weight on file to calculate BMI.  Gen:  WDWN AAF NAD  Neurological: Alert and oriented to person, place, and time. Motor and sensory function is grossly intact  Head: Normocephalic and atraumatic.  Eyes: Conjunctivae are normal. Pupils are equal, round, and reactive to light. No scleral icterus.  Neck: Normal range of motion. Neck supple. No tracheal deviation or thyromegaly present.  Cardiovascular:  SR without murmurs or gallops.  No carotid bruits Breast:  Not examined Respiratory: Effort normal.  No respiratory distress. No chest wall tenderness. Breath sounds normal.  No wheezes, rales or rhonchi.  Abdomen:  healed GU:  Not examined Musculoskeletal: Normal range of motion. Extremities are nontender. No cyanosis, edema or clubbing noted Lymphadenopathy: No cervical, preauricular, postauricular or axillary adenopathy is present Skin: Skin is warm and dry. No rash noted. No diaphoresis. No erythema. No pallor. Pscyh: Normal mood and affect. Behavior is normal. Judgment and thought content normal.   LABORATORY RESULTS: No results found for this or any previous visit (from the past 48 hour(s)).   RADIOLOGY RESULTS: No results found.  Problem List: Patient Active Problem List   Diagnosis Date Noted  . 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 S/P right colectomy 01/07/2012  . 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 Gout   . Diabetes mellitus   . Hypertension   . Hyperlipidemia     Assessment & Plan: portacath in place;  For removal    Matt B. Hassell Done, MD, [facility] [phone] beeper [phone]  05/03/2014 3:57 PM

## 2014-05-27 ENCOUNTER — Encounter (HOSPITAL_COMMUNITY): Payer: Self-pay

## 2014-05-28 ENCOUNTER — Other Ambulatory Visit (HOSPITAL_COMMUNITY): Payer: Self-pay | Admitting: *Deleted

## 2014-05-28 NOTE — Patient Instructions (Addendum)
SHALA BAUMBACH  05/28/2014                           YOUR PROCEDURE IS SCHEDULED ON:  06/04/14                ENTER FROM [address] - GO TO PARKING DECK               LOOK FOR [employer]  / [employer]                              FOLLOW  SIGNS TO SHORT STAY CENTER                 ARRIVE AT SHORT STAY AT:  7:40 AM               CALL THIS NUMBER IF ANY PROBLEMS THE DAY OF SURGERY :               [phone]                                REMEMBER:   Do not eat food or drink liquids AFTER MIDNIGHT              DISCONTINUE ASPIRIN / HERBAL MEDS 5 DAYS PREOP                  Take these medicines the morning of surgery with               A SIPS OF WATER : AMLODIPINE / GABAPENTIN        Do not wear jewelry, make-up   Do not wear lotions, powders, or perfumes.   Do not shave legs or underarms 12 hrs. before surgery (men may shave face)  Do not bring valuables to the hospital.  Contacts, dentures or bridgework may not be worn into surgery.  Leave suitcase in the car. After surgery it may be brought to your room.  For patients admitted to the hospital more than one night, checkout time is            11:00 AM                                                       The day of discharge.   Patients discharged the day of surgery will not be allowed to drive home.            If going home same day of surgery, must have someone stay with you              FIRST 24 hrs at home and arrange for some one to drive you              home from hospital.   ________________________________________________________________________  [place]  Before surgery, you can play an important role.  Because skin is not sterile, your skin needs to be as free of germs as possible.  You can reduce the number of germs on your skin by washing with CHG (chlorahexidine  gluconate) soap before surgery.  CHG is an antiseptic cleaner which kills germs and bonds with the skin to continue killing germs even after washing. Please DO NOT use if you have an allergy to CHG or antibacterial soaps.  If your skin becomes reddened/irritated stop using the CHG and inform your nurse when you arrive at Short Stay. Do not shave (including legs and underarms) for at least 48 hours prior to the first CHG shower.  You may shave your face. Please follow these instructions carefully:   1.  Shower with CHG Soap the night before surgery and the  morning of Surgery.   2.  If you choose to wash your hair, wash your hair first as usual with your  normal  Shampoo.   3.  After you shampoo, rinse your hair and body thoroughly to remove the  shampoo.                                         4.  Use CHG as you would any other liquid soap.  You can apply chg directly  to the skin and wash . Gently wash with scrungie or clean wascloth    5.  Apply the CHG Soap to your body ONLY FROM THE NECK DOWN.   Do not use on open                           Wound or open sores. Avoid contact with eyes, ears mouth and genitals (private parts).                        Genitals (private parts) with your normal soap.              6.  Wash thoroughly, paying special attention to the area where your surgery  will be performed.   7.  Thoroughly rinse your body with warm water from the neck down.   8.  DO NOT shower/wash with your normal soap after using and rinsing off  the CHG Soap .                9.  Pat yourself dry with a clean towel.             10.  Wear clean pajamas.             11.  Place clean sheets on your bed the night of your first shower and do not  sleep with pets.  Day of Surgery : Do not apply any lotions/deodorants the morning of surgery.  Please wear clean clothes to the hospital/surgery center.  FAILURE TO FOLLOW THESE INSTRUCTIONS MAY RESULT IN THE CANCELLATION OF YOUR  SURGERY    PATIENT SIGNATURE_________________________________  ______________________________________________________________________

## 2014-05-29 ENCOUNTER — Encounter (HOSPITAL_COMMUNITY)
Admission: RE | Admit: 2014-05-29 | Discharge: 2014-05-29 | Disposition: A | Discharge status: Home / Self Care | Payer: Medicare Other | Source: Ambulatory Visit | Attending: Surgery | Admitting: Surgery

## 2014-05-29 ENCOUNTER — Encounter (HOSPITAL_COMMUNITY): Payer: Self-pay

## 2014-05-29 DIAGNOSIS — Z01818 Encounter for other preprocedural examination: Secondary | ICD-10-CM | POA: Insufficient documentation

## 2014-05-29 DIAGNOSIS — I10 Essential (primary) hypertension: Secondary | ICD-10-CM | POA: Insufficient documentation

## 2014-05-29 HISTORY — DX: Polyneuropathy, unspecified: G62.9

## 2014-05-29 HISTORY — DX: Personal history of other malignant neoplasm of large intestine: Z85.038

## 2014-05-29 HISTORY — DX: Personal history of other infectious and parasitic diseases: Z86.19

## 2014-05-29 LAB — CBC
HCT: 40.1 % (ref 36.0–46.0)
HEMOGLOBIN: 13 g/dL (ref 12.0–15.0)
MCH: 25.9 pg — ABNORMAL LOW (ref 26.0–34.0)
MCHC: 32.4 g/dL (ref 30.0–36.0)
MCV: 80 fL (ref 78.0–100.0)
Platelets: 243 10*3/uL (ref 150–400)
RBC: 5.01 MIL/uL (ref 3.87–5.11)
RDW: 14.5 % (ref 11.5–15.5)
WBC: 4.6 10*3/uL (ref 4.0–10.5)

## 2014-05-29 LAB — BASIC METABOLIC PANEL
ANION GAP: 11 (ref 5–15)
BUN: 11 mg/dL (ref 6–23)
CHLORIDE: 102 meq/L (ref 96–112)
CO2: 29 mEq/L (ref 19–32)
Calcium: 9.4 mg/dL (ref 8.4–10.5)
Creatinine, Ser: 0.99 mg/dL (ref 0.50–1.10)
GFR calc non Af Amer: 56 mL/min — ABNORMAL LOW (ref >90)
GFR, EST AFRICAN AMERICAN: 64 mL/min — AB (ref >90)
Glucose, Bld: 99 mg/dL (ref 70–99)
Potassium: 4 mEq/L (ref 3.7–5.3)
SODIUM: 142 meq/L (ref 137–147)

## 2014-06-04 ENCOUNTER — Ambulatory Visit (HOSPITAL_COMMUNITY)
Admission: RE | Admit: 2014-06-04 | Discharge: 2014-06-04 | Disposition: A | Discharge status: Home / Self Care | Payer: Medicare Other | Source: Ambulatory Visit | Attending: Surgery | Admitting: Surgery

## 2014-06-04 ENCOUNTER — Encounter (HOSPITAL_COMMUNITY)
Admission: RE | Disposition: A | Discharge status: Home / Self Care | Payer: Self-pay | Source: Ambulatory Visit | Attending: Surgery

## 2014-06-04 ENCOUNTER — Ambulatory Visit (HOSPITAL_COMMUNITY): Payer: Medicare Other | Admitting: Anesthesiology

## 2014-06-04 ENCOUNTER — Encounter (HOSPITAL_COMMUNITY): Payer: Self-pay | Admitting: *Deleted

## 2014-06-04 DIAGNOSIS — Z9049 Acquired absence of other specified parts of digestive tract: Secondary | ICD-10-CM | POA: Diagnosis not present

## 2014-06-04 DIAGNOSIS — K449 Diaphragmatic hernia without obstruction or gangrene: Secondary | ICD-10-CM | POA: Insufficient documentation

## 2014-06-04 DIAGNOSIS — Z808 Family history of malignant neoplasm of other organs or systems: Secondary | ICD-10-CM | POA: Insufficient documentation

## 2014-06-04 DIAGNOSIS — J45909 Unspecified asthma, uncomplicated: Secondary | ICD-10-CM | POA: Diagnosis not present

## 2014-06-04 DIAGNOSIS — C7800 Secondary malignant neoplasm of unspecified lung: Secondary | ICD-10-CM | POA: Diagnosis not present

## 2014-06-04 DIAGNOSIS — Z452 Encounter for adjustment and management of vascular access device: Secondary | ICD-10-CM | POA: Diagnosis not present

## 2014-06-04 DIAGNOSIS — M1990 Unspecified osteoarthritis, unspecified site: Secondary | ICD-10-CM | POA: Diagnosis not present

## 2014-06-04 DIAGNOSIS — E119 Type 2 diabetes mellitus without complications: Secondary | ICD-10-CM | POA: Diagnosis not present

## 2014-06-04 DIAGNOSIS — G629 Polyneuropathy, unspecified: Secondary | ICD-10-CM | POA: Diagnosis not present

## 2014-06-04 DIAGNOSIS — Z85038 Personal history of other malignant neoplasm of large intestine: Secondary | ICD-10-CM | POA: Insufficient documentation

## 2014-06-04 DIAGNOSIS — K219 Gastro-esophageal reflux disease without esophagitis: Secondary | ICD-10-CM | POA: Diagnosis not present

## 2014-06-04 DIAGNOSIS — G43909 Migraine, unspecified, not intractable, without status migrainosus: Secondary | ICD-10-CM | POA: Diagnosis not present

## 2014-06-04 DIAGNOSIS — G4730 Sleep apnea, unspecified: Secondary | ICD-10-CM | POA: Insufficient documentation

## 2014-06-04 DIAGNOSIS — I10 Essential (primary) hypertension: Secondary | ICD-10-CM | POA: Insufficient documentation

## 2014-06-04 DIAGNOSIS — C189 Malignant neoplasm of colon, unspecified: Secondary | ICD-10-CM | POA: Insufficient documentation

## 2014-06-04 DIAGNOSIS — I2510 Atherosclerotic heart disease of native coronary artery without angina pectoris: Secondary | ICD-10-CM | POA: Insufficient documentation

## 2014-06-04 DIAGNOSIS — E785 Hyperlipidemia, unspecified: Secondary | ICD-10-CM | POA: Insufficient documentation

## 2014-06-04 DIAGNOSIS — M109 Gout, unspecified: Secondary | ICD-10-CM | POA: Insufficient documentation

## 2014-06-04 HISTORY — PX: PORT-A-CATH REMOVAL: SHX5289

## 2014-06-04 LAB — GLUCOSE, CAPILLARY
GLUCOSE-CAPILLARY: 112 mg/dL — AB (ref 70–99)
GLUCOSE-CAPILLARY: 101 mg/dL — AB (ref 70–99)

## 2014-06-04 SURGERY — REMOVAL PORT-A-CATH
Anesthesia: Monitor Anesthesia Care

## 2014-06-04 MED ORDER — PROPOFOL INFUSION 10 MG/ML OPTIME
INTRAVENOUS | Status: DC | PRN
  Administered 2014-06-04: 100 ug/kg/min via INTRAVENOUS

## 2014-06-04 MED ORDER — MIDAZOLAM HCL 5 MG/5ML IJ SOLN
INTRAMUSCULAR | Status: DC | PRN
  Administered 2014-06-04 (×2): 1 mg via INTRAVENOUS

## 2014-06-04 MED ORDER — ACETAMINOPHEN 325 MG PO TABS: 650.0000 mg | ORAL_TABLET | ORAL | Status: DC | PRN

## 2014-06-04 MED ORDER — SODIUM CHLORIDE 0.9 % IJ SOLN: 3.0000 mL | Freq: Two times a day (BID) | INTRAMUSCULAR | Status: DC

## 2014-06-04 MED ORDER — FENTANYL CITRATE 0.05 MG/ML IJ SOLN
INTRAMUSCULAR | Status: DC | PRN
  Administered 2014-06-04 (×2): 50 ug via INTRAVENOUS

## 2014-06-04 MED ORDER — LACTATED RINGERS IV SOLN: INTRAVENOUS | Status: DC

## 2014-06-04 MED ORDER — CEFAZOLIN SODIUM-DEXTROSE 2-3 GM-% IV SOLR
2.0000 g | INTRAVENOUS | Status: AC
  Administered 2014-06-04: 2 g via INTRAVENOUS

## 2014-06-04 MED ORDER — HYDROCODONE-ACETAMINOPHEN 5-325 MG PO TABS: 1.0000 | ORAL_TABLET | ORAL | Status: DC | PRN

## 2014-06-04 MED ORDER — SODIUM CHLORIDE 0.9 % IV SOLN: 250.0000 mL | INTRAVENOUS | Status: DC | PRN

## 2014-06-04 MED ORDER — HEPARIN SODIUM (PORCINE) 5000 UNIT/ML IJ SOLN
5000.0000 [IU] | Freq: Once | INTRAMUSCULAR | Status: AC
  Administered 2014-06-04: 5000 [IU] via SUBCUTANEOUS
  Filled 2014-06-04: qty 1

## 2014-06-04 MED ORDER — OXYCODONE HCL 5 MG PO TABS: 5.0000 mg | ORAL_TABLET | ORAL | Status: DC | PRN

## 2014-06-04 MED ORDER — FENTANYL CITRATE 0.05 MG/ML IJ SOLN: 25.0000 ug | INTRAMUSCULAR | Status: DC | PRN

## 2014-06-04 MED ORDER — FENTANYL CITRATE 0.05 MG/ML IJ SOLN
INTRAMUSCULAR | Status: AC
  Filled 2014-06-04: qty 2

## 2014-06-04 MED ORDER — PROPOFOL 10 MG/ML IV BOLUS
INTRAVENOUS | Status: AC
Start: 2014-06-04 — End: 2014-06-04
  Filled 2014-06-04: qty 20

## 2014-06-04 MED ORDER — PROPRANOLOL HCL 40 MG PO TABS
40.0000 mg | ORAL_TABLET | Freq: Once | ORAL | Status: AC
  Administered 2014-06-04: 40 mg via ORAL
  Filled 2014-06-04: qty 1

## 2014-06-04 MED ORDER — CEFAZOLIN SODIUM-DEXTROSE 2-3 GM-% IV SOLR
INTRAVENOUS | Status: AC
  Filled 2014-06-04: qty 50

## 2014-06-04 MED ORDER — PROPOFOL 10 MG/ML IV BOLUS
INTRAVENOUS | Status: DC | PRN
Start: 2014-06-04 — End: 2014-06-04
  Administered 2014-06-04: 20 mg via INTRAVENOUS

## 2014-06-04 MED ORDER — SODIUM CHLORIDE 0.9 % IJ SOLN: 3.0000 mL | INTRAMUSCULAR | Status: DC | PRN

## 2014-06-04 MED ORDER — LACTATED RINGERS IV SOLN
INTRAVENOUS | Status: DC | PRN
  Administered 2014-06-04: 09:00:00 via INTRAVENOUS

## 2014-06-04 MED ORDER — MIDAZOLAM HCL 2 MG/2ML IJ SOLN
INTRAMUSCULAR | Status: AC
  Filled 2014-06-04: qty 2

## 2014-06-04 MED ORDER — PROMETHAZINE HCL 25 MG/ML IJ SOLN: 6.2500 mg | INTRAMUSCULAR | Status: DC | PRN

## 2014-06-04 MED ORDER — ACETAMINOPHEN 650 MG RE SUPP
650.0000 mg | RECTAL | Status: DC | PRN
  Filled 2014-06-04: qty 1

## 2014-06-04 MED ORDER — BUPIVACAINE-EPINEPHRINE 0.25% -1:200000 IJ SOLN
INTRAMUSCULAR | Status: DC | PRN
  Administered 2014-06-04: 10 mL

## 2014-06-04 SURGICAL SUPPLY — 29 items
APL SKNCLS STERI-STRIP NONHPOA (GAUZE/BANDAGES/DRESSINGS)
BENZOIN TINCTURE PRP APPL 2/3 (GAUZE/BANDAGES/DRESSINGS) IMPLANT
BLADE HEX COATED 2.75 (ELECTRODE) ×3 IMPLANT
BLADE SURG 15 STRL LF DISP TIS (BLADE) ×1 IMPLANT
BLADE SURG 15 STRL SS (BLADE) ×3
CANISTER SUCTION 2500CC (MISCELLANEOUS) IMPLANT
CLOSURE WOUND 1/2 X4 (GAUZE/BANDAGES/DRESSINGS)
DECANTER SPIKE VIAL GLASS SM (MISCELLANEOUS) ×3 IMPLANT
DRAPE LAPAROTOMY TRNSV 102X78 (DRAPE) ×3 IMPLANT
ELECT REM PT RETURN 9FT ADLT (ELECTROSURGICAL) ×3
ELECTRODE REM PT RTRN 9FT ADLT (ELECTROSURGICAL) ×1 IMPLANT
GAUZE SPONGE 4X4 12PLY STRL (GAUZE/BANDAGES/DRESSINGS) IMPLANT
GAUZE SPONGE 4X4 16PLY XRAY LF (GAUZE/BANDAGES/DRESSINGS) ×3 IMPLANT
GLOVE BIOGEL M 8.0 STRL (GLOVE) ×3 IMPLANT
GLOVE BIOGEL PI IND STRL 7.0 (GLOVE) ×1 IMPLANT
GLOVE BIOGEL PI INDICATOR 7.0 (GLOVE) ×2
GOWN SPEC L4 XLG W/TWL (GOWN DISPOSABLE) ×3 IMPLANT
GOWN STRL REUS W/TWL LRG LVL3 (GOWN DISPOSABLE) IMPLANT
GOWN STRL REUS W/TWL XL LVL3 (GOWN DISPOSABLE) ×9 IMPLANT
KIT BASIN OR (CUSTOM PROCEDURE TRAY) ×3 IMPLANT
LIQUID BAND (GAUZE/BANDAGES/DRESSINGS) ×3 IMPLANT
NEEDLE HYPO 25X1 1.5 SAFETY (NEEDLE) ×3 IMPLANT
NS IRRIG 1000ML POUR BTL (IV SOLUTION) IMPLANT
PACK BASIC VI WITH GOWN DISP (CUSTOM PROCEDURE TRAY) ×3 IMPLANT
PENCIL BUTTON HOLSTER BLD 10FT (ELECTRODE) ×3 IMPLANT
STRIP CLOSURE SKIN 1/2X4 (GAUZE/BANDAGES/DRESSINGS) IMPLANT
SUT VIC AB 4-0 SH 18 (SUTURE) ×3 IMPLANT
SYR CONTROL 10ML LL (SYRINGE) ×3 IMPLANT
YANKAUER SUCT BULB TIP 10FT TU (MISCELLANEOUS) IMPLANT

## 2014-06-04 NOTE — Anesthesia Postprocedure Evaluation (Signed)
  Anesthesia Post-op Note  Patient: Peggy French  Procedure(s) Performed: Procedure(s) (LRB): REMOVAL PORT-A-CATH (N/A)  Patient Location: PACU  Anesthesia Type: MAC  Level of Consciousness: awake and alert   Airway and Oxygen Therapy: Patient Spontanous Breathing  Post-op Pain: mild  Post-op Assessment: Post-op Vital signs reviewed, Patient's Cardiovascular Status Stable, Respiratory Function Stable, Patent Airway and No signs of Nausea or vomiting  Last Vitals:  Filed Vitals:   06/04/14 1015  BP: 118/52  Pulse: 80  Temp: 36.4 C  Resp: 16    Post-op Vital Signs: stable   Complications: No apparent anesthesia complications

## 2014-06-04 NOTE — Anesthesia Preprocedure Evaluation (Signed)
Anesthesia Evaluation  Patient identified by MRN, date of birth, ID band Patient awake    Reviewed: Allergy & Precautions, H&P , NPO status , Patient's Chart, lab work & pertinent test results  Airway Mallampati: II  TM Distance: >3 FB Neck ROM: Full    Dental no notable dental hx.    Pulmonary sleep apnea ,  breath sounds clear to auscultation  Pulmonary exam normal       Cardiovascular hypertension, Pt. on medications Rhythm:Regular Rate:Normal     Neuro/Psych negative neurological ROS  negative psych ROS   GI/Hepatic Neg liver ROS, GERD-  Medicated,  Endo/Other  diabetes  Renal/GU negative Renal ROS  negative genitourinary   Musculoskeletal negative musculoskeletal ROS (+)   Abdominal   Peds negative pediatric ROS (+)  Hematology negative hematology ROS (+)   Anesthesia Other Findings   Reproductive/Obstetrics negative OB ROS                             Anesthesia Physical Anesthesia Plan  ASA: III  Anesthesia Plan: MAC   Post-op Pain Management:    Induction: Intravenous  Airway Management Planned: Simple Face Mask  Additional Equipment:   Intra-op Plan:   Post-operative Plan:   Informed Consent: I have reviewed the patients History and Physical, chart, labs and discussed the procedure including the risks, benefits and alternatives for the proposed anesthesia with the patient or authorized representative who has indicated his/her understanding and acceptance.   Dental advisory given  Plan Discussed with: CRNA and Surgeon  Anesthesia Plan Comments:         Anesthesia Quick Evaluation

## 2014-06-04 NOTE — Transfer of Care (Signed)
Immediate Anesthesia Transfer of Care Note  Patient: Peggy French  Procedure(s) Performed: Procedure(s): REMOVAL PORT-A-CATH (N/A)  Patient Location: PACU  Anesthesia Type:MAC  Level of Consciousness: awake, alert , oriented and patient cooperative  Airway & Oxygen Therapy: Patient Spontanous Breathing and Patient connected to face mask oxygen  Post-op Assessment: Report given to PACU RN, Post -op Vital signs reviewed and stable and Patient moving all extremities X 4  Post vital signs: stable  Complications: No apparent anesthesia complications

## 2014-06-04 NOTE — H&P (Signed)
Chief Complaint:  For portacath removal  History of Present Illness:  Peggy French is an 73 y.o. female who had a portacath placed for her colon cancer.  Was found to have lung cancer which was also treated.  Needs portacath removed  Past Medical History  Diagnosis Date  . Gout   . Diabetes mellitus     x over 10 yrs  . Hyperlipidemia   . GERD (gastroesophageal reflux disease)   . Chest pain     NONE SINCE 2013  . H/O hiatal hernia   . Headache(784.0)     hx migraines  . Arthritis   . Asthmatic bronchitis 2011  . Hypertension     EKG 10/12 EPIC, chest- 1 view  6/13 EPIC    Last 2D Echo on 05/02/2012 showed EF of greater than 55%  . Coronary artery disease   . Cancer     LOV Dr Benay Spice 02/14/12 EPIC  . 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 Family history of adverse reaction to anesthesia     FAMILY MEMBERS HAVE HAD N/V  . History of shingles   . History of colon cancer   . Sleep apnea     does not have CPAP machine  sleep study done on 05/19/2011  AHI during total sleep time (3h 53 min) 13.1/hr and REM sleep at 46.7/hr  . Neuropathy     Past Surgical History  Procedure Laterality Date  . Cholecystectomy    . Abdominal hysterectomy    . Esophagogastroduodenoscopy  06/16/2011    Procedure: ESOPHAGOGASTRODUODENOSCOPY (EGD);  Surgeon: Rogene Houston, MD;  [facility];  Service: Endoscopy;  Laterality: N/A;  2:15  . Colonoscopy  11/26/2011    Procedure: COLONOSCOPY;  Surgeon: Rogene Houston, MD;  [facility];  Service: Endoscopy;  Laterality: N/A;  2:15  . Appendectomy    . Cardiac catheterization  2001,2012  . Portacath placement  02/22/2012    Procedure: INSERTION PORT-A-CATH;  Surgeon: Pedro Earls, MD;  [facility]: [facility];  Service: General;  Laterality: N/A;  left subclavian  . Colonoscopy N/A 12/08/2012    Procedure: COLONOSCOPY;  Surgeon: Rogene Houston, MD;  [facility];  Service: Endoscopy;  Laterality: N/A;  815-moved  to 0950 Ann to notify pt  . Video bronchoscopy N/A 03/27/2013    Procedure: VIDEO BRONCHOSCOPY;  Surgeon: Grace Isaac, MD;  Location: [facility];  Service: Thoracic;  Laterality: N/A;  . Video assisted thoracoscopy (vats)/wedge resection Left 03/27/2013    Procedure: VIDEO ASSISTED THORACOSCOPY (VATS)/WEDGE RESECTION;  Surgeon: Grace Isaac, MD;  Location: [facility];  Service: Thoracic;  Laterality: Left;  . Colon surgery      PARTIAL COLECTOMY 30 YRS / AGAIN 2013    Current Facility-Administered Medications  Medication Dose Route Frequency Provider Last Rate Last Dose  . ceFAZolin (ANCEF) IVPB 2 g/50 mL premix  2 g Intravenous On Call to OR Pedro Earls, MD       Facility-Administered Medications Ordered in Other Encounters  Medication Dose Route Frequency Provider Last Rate Last Dose  . lactated ringers infusion    Continuous PRN Dione Booze, CRNA      . sodium chloride 0.9 % injection 10 mL  10 mL Intracatheter PRN Ladell Pier, MD       Aspirin Family History  Problem Relation Age of Onset  . Cancer Maternal Aunt     throat,    Social History:  reports that she has never smoked. She has never used smokeless tobacco. She reports that she does not drink alcohol or use illicit drugs.   REVIEW OF SYSTEMS : Negative except for see HPI  Physical Exam:   Blood pressure 152/77, pulse 98, temperature 97.6 F (36.4 C), temperature source Oral, resp. rate 16, height 5\' 4"  (1.626 m), weight 218 lb (98.884 kg), SpO2 100 %. Body mass index is 37.4 kg/(m^2).  Gen:  WDWN AAF NAD  Neurological: Alert and oriented to person, place, and time. Motor and sensory function is grossly intact  Head: Normocephalic and atraumatic.  Eyes: Conjunctivae are normal. Pupils are equal, round, and reactive to light. No scleral icterus.  Neck: Normal range of motion. Neck supple. No tracheal deviation or thyromegaly present.  Cardiovascular:  SR without murmurs or gallops.  No carotid  bruits Breast:  Not examined Respiratory: Effort normal.  No respiratory distress. No chest wall tenderness. Breath sounds normal.  No wheezes, rales or rhonchi.  Abdomen:  nontender GU:  Not examined Musculoskeletal: Normal range of motion. Extremities are nontender. No cyanosis, edema or clubbing noted Lymphadenopathy: No cervical, preauricular, postauricular or axillary adenopathy is present Skin: Skin is warm and dry. No rash noted. No diaphoresis. No erythema. No pallor. Pscyh: Normal mood and affect. Behavior is normal. Judgment and thought content normal.   LABORATORY RESULTS: Results for orders placed or performed during the hospital encounter of 06/04/14 (from the past 48 hour(s))  Glucose, capillary     Status: Abnormal   Collection Time: 06/04/14  7:47 AM  Result Value Ref Range   Glucose-Capillary 112 (H) 70 - 99 mg/dL   Comment 1 Documented in Chart      RADIOLOGY RESULTS: No results found.  Problem List: Patient Active Problem List   Diagnosis Date Noted  . 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 S/P right colectomy 01/07/2012  . 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 Gout   . Diabetes mellitus   . Hypertension   . Hyperlipidemia     Assessment & Plan: Portacath in place-for removal    Matt B. Hassell Done, MD, [facility] [phone] beeper [phone]  06/04/2014 9:33 AM

## 2014-06-04 NOTE — Op Note (Signed)
Surgeon: Kaylyn Lim, MD, FACS  Asst:  none  Anes:  MAC with 1% lidocaine local  Procedure: Explantation of portacath  Diagnosis: Cancer of the colon and lung  Complications: none  EBL:   minimal cc  Drains: none  Description of Procedure:  The patient was taken to OR 1 at [facility].  After anesthesia was administered and the patient was prepped a timeout was performed.  The left subclavian port site was infiltrated with lidocaine and the old keloid scar excised.  The purple port was freed from the single prolene suture that held it and the port and catheter were removed in toto.  The wound was closed with 4-0 vicryl and Dermabond.    The patient tolerated the procedure well and was taken to the PACU in stable condition.     Matt B. Hassell Done, [place], [facility], [place]

## 2014-06-04 NOTE — Discharge Instructions (Signed)
May shower and get wet  Inspect site daily and report signs of infection to MD.  Conscious Sedation, Adult, Care After Refer to this sheet in the next few weeks. These instructions provide you with information on caring for yourself after your procedure. Your health care provider may also give you more specific instructions. Your treatment has been planned according to current medical practices, but problems sometimes occur. Call your health care provider if you have any problems or questions after your procedure. WHAT TO EXPECT AFTER THE PROCEDURE  After your procedure:  You may feel sleepy, clumsy, and have poor balance for several hours.  Vomiting may occur if you eat too soon after the procedure. HOME CARE INSTRUCTIONS  Do not participate in any activities where you could become injured for at least 24 hours. Do not:  Drive.  Swim.  Ride a bicycle.  Operate heavy machinery.  Cook.  Use power tools.  Climb ladders.  Work from a high place.  Do not make important decisions or sign legal documents until you are improved.  If you vomit, drink water, juice, or soup when you can drink without vomiting. Make sure you have little or no nausea before eating solid foods.  Only take over-the-counter or prescription medicines for pain, discomfort, or fever as directed by your health care provider.  Make sure you and your family fully understand everything about the medicines given to you, including what side effects may occur.  You should not drink alcohol, take sleeping pills, or take medicines that cause drowsiness for at least 24 hours.  If you smoke, do not smoke without supervision.  If you are feeling better, you may resume normal activities 24 hours after you were sedated.  Keep all appointments with your health care provider. SEEK MEDICAL CARE IF:  Your skin is pale or bluish in color.  You continue to feel nauseous or vomit.  Your pain is getting worse and is not  helped by medicine.  You have bleeding or swelling.  You are still sleepy or feeling clumsy after 24 hours. SEEK IMMEDIATE MEDICAL CARE IF:  You develop a rash.  You have difficulty breathing.  You develop any type of allergic problem.  You have a fever. MAKE SURE YOU:  Understand these instructions.  Will watch your condition.  Will get help right away if you are not doing well or get worse. Document Released: 04/18/2013 Document Reviewed: 04/18/2013 [facility] Patient Information 2015 [place], [place]. This information is not intended to replace advice given to you by your health care provider. Make sure you discuss any questions you have with your health care provider.

## 2014-06-07 ENCOUNTER — Encounter (HOSPITAL_COMMUNITY): Payer: Self-pay | Admitting: Surgery

## 2014-07-02 ENCOUNTER — Ambulatory Visit: Payer: Medicare Other | Admitting: Lab

## 2014-07-02 ENCOUNTER — Telehealth: Payer: Self-pay | Admitting: Oncology

## 2014-07-02 ENCOUNTER — Ambulatory Visit (HOSPITAL_BASED_OUTPATIENT_CLINIC_OR_DEPARTMENT_OTHER): Payer: Medicare Other | Admitting: Oncology

## 2014-07-02 VITALS — BP 139/61 | HR 83 | Temp 98.3°F | Resp 19 | Ht 64.0 in | Wt 220.7 lb

## 2014-07-02 DIAGNOSIS — C180 Malignant neoplasm of cecum: Secondary | ICD-10-CM | POA: Diagnosis not present

## 2014-07-02 DIAGNOSIS — C7802 Secondary malignant neoplasm of left lung: Secondary | ICD-10-CM

## 2014-07-02 DIAGNOSIS — G620 Drug-induced polyneuropathy: Secondary | ICD-10-CM

## 2014-07-02 DIAGNOSIS — C189 Malignant neoplasm of colon, unspecified: Secondary | ICD-10-CM | POA: Diagnosis not present

## 2014-07-02 DIAGNOSIS — C7800 Secondary malignant neoplasm of unspecified lung: Principal | ICD-10-CM

## 2014-07-02 LAB — CEA: CEA: 2.5 ng/mL (ref 0.0–5.0)

## 2014-07-02 NOTE — Progress Notes (Signed)
  [place] OFFICE PROGRESS NOTE   Diagnosis: Colon cancer  INTERVAL HISTORY:   Peggy French returns as scheduled. She has intermittent exertional dyspnea. The Port-A-Cath was removed and she is developing a keloid at the Port-A-Cath site. Neuropathy symptoms improved with Neurontin.  Objective:  Vital signs in last 24 hours:  Blood pressure 139/61, pulse 83, temperature 98.3 F (36.8 C), temperature source Oral, resp. rate 19, height 5\' 4"  (1.626 m), weight 220 lb 11.2 oz (100.109 kg), SpO2 98 %.    HEENT: Neck without mass Lymphatics: No cervical, supra-clavicular, axillary, or inguinal nodes Resp: Lungs clear bilaterally Cardio: Regular rate and rhythm GI: No hepatomegaly, nontender, no mass Vascular: No leg edema   Lab Results:  Lab Results  Component Value Date   WBC 4.6 05/29/2014   HGB 13.0 05/29/2014   HCT 40.1 05/29/2014   MCV 80.0 05/29/2014   PLT 243 05/29/2014   NEUTROABS 2.8 07/18/2013      Lab Results  Component Value Date   [name] 2.5 07/02/2014     Medications: I have reviewed the patient's current medications.  Assessment/Plan: 1. 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 She began adjuvant CAPOX chemotherapy on 02/24/2012. She completed cycle 8 on 07/27/2012. Negative surveillance colonoscopy 12/08/2012 2. Delayed nausea following cycle 1 CAPOX. Aloxi was added beginning with cycle 2 with improvement. 3. Diabetes. 4. Microcytic anemia. Hemoglobin is normal. She will discontinue iron. 5. Remote history of a "colon tumor" removed endoscopically in 1980. 6. Left lower lung nodule noted on a CT 02/21/2012, slightly larger than on a CT 12/03/2011. Chest CT 06/12/2012 showed the previously seen 5 mm pulmonary nodule in left lower lobe was scarcely visualized, estimated at 3 mm. No other suspicious pulmonary nodules were identified. Chest CT 11/27/2012 showed interval enlargement of the left  lower lobe pulmonary nodule with a current measurement of 7 mm x 8 mm. No new pulmonary nodules. CT 02/22/2013 with enlargement of the anterior left lower lobe nodule and no other lung nodules. PET scan 03/02/2013 showed no associated hypermetabolism with the 12 mm left lower lobe pulmonary nodule. No hypermetabolic lymph nodes in the neck, chest, abdomen or pelvis. No abnormal hypermetabolic activity within the liver, pancreas, adrenal glands or spleen. Abnormal FDG accumulation in the posterior left nasopharyngeal mucosa.  Status post a wedge resection of the left lower lobe nodule on 03/27/2013 with the pathology confirming metastatic adenocarcinoma of colorectal primary.   Chest CT 07/18/2013-negative for recurrent disease.   Chest CT 09/19/2013 to evaluate for shortness of breath showed postoperative changes in the left lower lobe without evidence of recurrent or metastatic disease.  CT chest 04/01/2014 with postsurgical scarring in the left lower lobe. No evidence for metastatic disease in the chest. Stable appearance of mild subpleural reticulation of the lung bases. 7. Status post Port-A-Cath placement 02/22/2012. Status post Port-A-Cath repositioning 03/14/2012. Port-A-Cath removed 06/04/2014 8. Probable acute laryngopharyngeal dysesthesia following cycle 2 CAPOX. Symptoms resolved with warming.  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 She has persistent neuropathy symptoms involving the feet. Improved with Neurontin 10. History of anterior chest pain-she reports undergoing an evaluation by Dr. Debara Pickett. 11. 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     Disposition:  Peggy French remains in clinical remission from colon cancer. She will return for an office visit and restaging CT evaluation in 3 months.  Peggy Coder, MD  07/02/2014  4:17 PM

## 2014-07-02 NOTE — Telephone Encounter (Signed)
gv adn printed appt sched and avs for pt for March 2016...pt getting barium from [place]

## 2014-07-03 ENCOUNTER — Telehealth: Payer: Self-pay | Admitting: *Deleted

## 2014-07-03 NOTE — Telephone Encounter (Signed)
-----   Message from Owens Shark, NP sent at 07/02/2014  4:32 PM EST ----- Please let her know [name] is normal.

## 2014-07-03 NOTE — Telephone Encounter (Signed)
Called and informed patient of normal cea.  Per Elby Showers. Marcello Moores, NP.  Patient verbalized understanding.

## 2014-07-25 ENCOUNTER — Encounter (HOSPITAL_COMMUNITY): Payer: Self-pay | Admitting: Surgery

## 2014-09-02 DIAGNOSIS — E1121 Type 2 diabetes mellitus with diabetic nephropathy: Secondary | ICD-10-CM | POA: Diagnosis not present

## 2014-09-02 DIAGNOSIS — I10 Essential (primary) hypertension: Secondary | ICD-10-CM | POA: Diagnosis not present

## 2014-10-01 ENCOUNTER — Other Ambulatory Visit: Payer: Self-pay | Admitting: Oncology

## 2014-10-01 ENCOUNTER — Ambulatory Visit (HOSPITAL_COMMUNITY)
Admission: RE | Admit: 2014-10-01 | Discharge: 2014-10-01 | Disposition: A | Discharge status: Home / Self Care | Payer: Medicare Other | Source: Ambulatory Visit | Attending: Oncology | Admitting: Oncology

## 2014-10-01 ENCOUNTER — Other Ambulatory Visit: Payer: Medicare Other

## 2014-10-01 ENCOUNTER — Other Ambulatory Visit (HOSPITAL_BASED_OUTPATIENT_CLINIC_OR_DEPARTMENT_OTHER): Payer: Medicare Other

## 2014-10-01 ENCOUNTER — Encounter (HOSPITAL_COMMUNITY): Payer: Self-pay

## 2014-10-01 DIAGNOSIS — C180 Malignant neoplasm of cecum: Secondary | ICD-10-CM | POA: Diagnosis not present

## 2014-10-01 DIAGNOSIS — C7800 Secondary malignant neoplasm of unspecified lung: Principal | ICD-10-CM

## 2014-10-01 DIAGNOSIS — C7802 Secondary malignant neoplasm of left lung: Secondary | ICD-10-CM | POA: Diagnosis not present

## 2014-10-01 DIAGNOSIS — C189 Malignant neoplasm of colon, unspecified: Secondary | ICD-10-CM

## 2014-10-01 DIAGNOSIS — Z9221 Personal history of antineoplastic chemotherapy: Secondary | ICD-10-CM | POA: Diagnosis not present

## 2014-10-01 LAB — COMPREHENSIVE METABOLIC PANEL (CC13)
ALT: 14 U/L (ref 0–55)
ANION GAP: 12 meq/L — AB (ref 3–11)
AST: 18 U/L (ref 5–34)
Albumin: 3.4 g/dL — ABNORMAL LOW (ref 3.5–5.0)
Alkaline Phosphatase: 115 U/L (ref 40–150)
BILIRUBIN TOTAL: 0.26 mg/dL (ref 0.20–1.20)
BUN: 11.4 mg/dL (ref 7.0–26.0)
CHLORIDE: 101 meq/L (ref 98–109)
CO2: 28 mEq/L (ref 22–29)
Calcium: 9.1 mg/dL (ref 8.4–10.4)
Creatinine: 1 mg/dL (ref 0.6–1.1)
EGFR: 64 mL/min/{1.73_m2} — AB (ref >90)
Glucose: 88 mg/dl (ref 70–140)
Potassium: 3.8 mEq/L (ref 3.5–5.1)
Sodium: 141 mEq/L (ref 136–145)
Total Protein: 8 g/dL (ref 6.4–8.3)

## 2014-10-01 LAB — CEA: CEA: 2.9 ng/mL (ref 0.0–5.0)

## 2014-10-01 MED ORDER — IOHEXOL 300 MG/ML  SOLN
100.0000 mL | Freq: Once | INTRAMUSCULAR | Status: AC | PRN
  Administered 2014-10-01: 100 mL via INTRAVENOUS

## 2014-10-03 ENCOUNTER — Telehealth: Payer: Self-pay | Admitting: *Deleted

## 2014-10-03 ENCOUNTER — Ambulatory Visit (HOSPITAL_BASED_OUTPATIENT_CLINIC_OR_DEPARTMENT_OTHER): Payer: Medicare Other | Admitting: Nurse Practitioner

## 2014-10-03 ENCOUNTER — Telehealth: Payer: Self-pay | Admitting: Oncology

## 2014-10-03 VITALS — BP 134/57 | HR 86 | Temp 98.2°F | Resp 18 | Ht 64.0 in | Wt 220.1 lb

## 2014-10-03 DIAGNOSIS — G620 Drug-induced polyneuropathy: Secondary | ICD-10-CM

## 2014-10-03 DIAGNOSIS — C189 Malignant neoplasm of colon, unspecified: Secondary | ICD-10-CM

## 2014-10-03 DIAGNOSIS — Z85038 Personal history of other malignant neoplasm of large intestine: Secondary | ICD-10-CM

## 2014-10-03 DIAGNOSIS — C7800 Secondary malignant neoplasm of unspecified lung: Principal | ICD-10-CM

## 2014-10-03 NOTE — Telephone Encounter (Signed)
Received phone call back from Dr. Olevia Perches office stating that patient is not due for a colonoscopy until June 2017.  Left message on patient's VM with information.  Per Elby Showers. Marcello Moores, NP.

## 2014-10-03 NOTE — Telephone Encounter (Signed)
Left message at Dr. Olevia Perches office [phone] to see when patient is due for a colonoscopy.  Per Elby Showers. Marcello Moores, NP.

## 2014-10-03 NOTE — Telephone Encounter (Signed)
gave and printed appt sched adn avs for pt for Sept

## 2014-10-03 NOTE — Progress Notes (Addendum)
[place] OFFICE PROGRESS NOTE   Diagnosis:  Colon cancer  INTERVAL HISTORY:   Peggy French returns as scheduled. She feels well. No change in bowel habits. She denies abdominal pain. 4-6 weeks ago she had 2 episodes where she noted bright red blood on the toilet tissue after a bowel movement. No further occurrences. She thinks she is due for a colonoscopy this year. She has a good appetite. Stable dyspnea on exertion. No cough. No fever.  Objective:  Vital signs in last 24 hours:  Blood pressure 134/57, pulse 86, temperature 98.2 F (36.8 C), temperature source Oral, resp. rate 18, height 5\' 4"  (1.626 m), weight 220 lb 1.6 oz (99.837 kg), SpO2 98 %.    HEENT: No thrush or ulcers. Lymphatics: No palpable cervical, supra clavicular, axillary or inguinal lymph nodes. Resp: Lungs clear bilaterally. Cardio: Regular rate and rhythm. GI: Abdomen soft and nontender. No hepatomegaly. No mass. Vascular: No leg edema.  Lab Results:  Lab Results  Component Value Date   WBC 4.6 05/29/2014   HGB 13.0 05/29/2014   HCT 40.1 05/29/2014   MCV 80.0 05/29/2014   PLT 243 05/29/2014   NEUTROABS 2.8 07/18/2013   10/01/2014 [name] 2.9  Imaging:  Ct Chest W Contrast  10/01/2014   CLINICAL DATA:  Colon cancer diagnosed May 2013 with resection x2. IV and oral chemotherapy complete 2014. Metastatic to lung.  EXAM: CT CHEST, ABDOMEN, AND PELVIS WITH CONTRAST  TECHNIQUE: Multidetector CT imaging of the chest, abdomen and pelvis was performed following the standard protocol during bolus administration of intravenous contrast.  CONTRAST:  171mL OMNIPAQUE IOHEXOL 300 MG/ML  SOLN  COMPARISON:  04/01/2014 chest CT. CT the abdomen and pelvis 11/27/2012.  FINDINGS: CT CHEST FINDINGS  Mediastinum/Nodes: Stable scattered coronary artery calcifications. Small hiatal hernia. Heart is normal size. Aorta is normal caliber. No mediastinal, hilar, or axillary adenopathy.  Lungs/Pleura: Postoperative  changes in the left lung base. Areas of scarring and subpleural reticulation in the lower lobes bilaterally, stable. No pleural effusions. No pulmonary nodules or areas of consolidation.  Musculoskeletal: Left side port remains in place, interval removal of previously seen left side port. Thyroid appears diffusely enlarged without focal abnormality. Chest wall soft tissues are unremarkable. No acute bony abnormality or focal bone lesion.  CT ABDOMEN PELVIS FINDINGS  Hepatobiliary: Prior cholecystectomy. No focal hepatic lesion or biliary ductal dilatation.  Pancreas: No focal abnormality or ductal dilatation.  Spleen: No focal abnormality.  Normal size.  Adrenals/Urinary Tract: No focal renal or adrenal abnormality. No hydronephrosis. Urinary bladder is unremarkable.  Stomach/Bowel: Stomach, large and small bowel unremarkable.  Vascular/Lymphatic: No retroperitoneal or mesenteric adenopathy. Aorta normal caliber.  Reproductive: Prior hysterectomy.  No adnexal masses.  Other: No free fluid or free air.  Musculoskeletal: No focal bone lesion or acute bony abnormality.  IMPRESSION: Postoperative changes in the left lower lobe.  No evidence of metastatic disease in the chest, abdomen or pelvis.  Coronary artery disease.   Electronically Signed   By: Rolm Baptise M.D.   On: 10/01/2014 16:15   Ct Abdomen Pelvis W Contrast  10/01/2014   CLINICAL DATA:  Colon cancer diagnosed May 2013 with resection x2. IV and oral chemotherapy complete 2014. Metastatic to lung.  EXAM: CT CHEST, ABDOMEN, AND PELVIS WITH CONTRAST  TECHNIQUE: Multidetector CT imaging of the chest, abdomen and pelvis was performed following the standard protocol during bolus administration of intravenous contrast.  CONTRAST:  171mL OMNIPAQUE IOHEXOL 300 MG/ML  SOLN  COMPARISON:  04/01/2014 chest CT. CT the abdomen and pelvis 11/27/2012.  FINDINGS: CT CHEST FINDINGS  Mediastinum/Nodes: Stable scattered coronary artery calcifications. Small hiatal hernia.  Heart is normal size. Aorta is normal caliber. No mediastinal, hilar, or axillary adenopathy.  Lungs/Pleura: Postoperative changes in the left lung base. Areas of scarring and subpleural reticulation in the lower lobes bilaterally, stable. No pleural effusions. No pulmonary nodules or areas of consolidation.  Musculoskeletal: Left side port remains in place, interval removal of previously seen left side port. Thyroid appears diffusely enlarged without focal abnormality. Chest wall soft tissues are unremarkable. No acute bony abnormality or focal bone lesion.  CT ABDOMEN PELVIS FINDINGS  Hepatobiliary: Prior cholecystectomy. No focal hepatic lesion or biliary ductal dilatation.  Pancreas: No focal abnormality or ductal dilatation.  Spleen: No focal abnormality.  Normal size.  Adrenals/Urinary Tract: No focal renal or adrenal abnormality. No hydronephrosis. Urinary bladder is unremarkable.  Stomach/Bowel: Stomach, large and small bowel unremarkable.  Vascular/Lymphatic: No retroperitoneal or mesenteric adenopathy. Aorta normal caliber.  Reproductive: Prior hysterectomy.  No adnexal masses.  Other: No free fluid or free air.  Musculoskeletal: No focal bone lesion or acute bony abnormality.  IMPRESSION: Postoperative changes in the left lower lobe.  No evidence of metastatic disease in the chest, abdomen or pelvis.  Coronary artery disease.   Electronically Signed   By: Rolm Baptise M.D.   On: 10/01/2014 16:15    Medications: I have reviewed the patient's current medications.  Assessment/Plan: 1. 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 She began adjuvant CAPOX chemotherapy on 02/24/2012. She completed cycle 8 on 07/27/2012. Negative surveillance colonoscopy 12/08/2012 2. Delayed nausea following cycle 1 CAPOX. Aloxi was added beginning with cycle 2 with improvement. 3. Diabetes. 4. Microcytic anemia. Hemoglobin is normal. She will discontinue iron. 5. Remote  history of a "colon tumor" removed endoscopically in 1980. 6. Left lower lung nodule noted on a CT 02/21/2012, slightly larger than on a CT 12/03/2011. Chest CT 06/12/2012 showed the previously seen 5 mm pulmonary nodule in left lower lobe was scarcely visualized, estimated at 3 mm. No other suspicious pulmonary nodules were identified. Chest CT 11/27/2012 showed interval enlargement of the left lower lobe pulmonary nodule with a current measurement of 7 mm x 8 mm. No new pulmonary nodules. CT 02/22/2013 with enlargement of the anterior left lower lobe nodule and no other lung nodules. PET scan 03/02/2013 showed no associated hypermetabolism with the 12 mm left lower lobe pulmonary nodule. No hypermetabolic lymph nodes in the neck, chest, abdomen or pelvis. No abnormal hypermetabolic activity within the liver, pancreas, adrenal glands or spleen. Abnormal FDG accumulation in the posterior left nasopharyngeal mucosa.  Status post a wedge resection of the left lower lobe nodule on 03/27/2013 with the pathology confirming metastatic adenocarcinoma of colorectal primary.   Chest CT 07/18/2013-negative for recurrent disease.   Chest CT 09/19/2013 to evaluate for shortness of breath showed postoperative changes in the left lower lobe without evidence of recurrent or metastatic disease.  CT chest 04/01/2014 with postsurgical scarring in the left lower lobe. No evidence for metastatic disease in the chest. Stable appearance of mild subpleural reticulation of the lung bases.  CT chest/abdomen/pelvis 10/01/2014 with postoperative changes left lower lobe. No evidence of metastatic disease in the chest, abdomen or pelvis. 7. Status post Port-A-Cath placement 02/22/2012. Status post Port-A-Cath repositioning 03/14/2012. Port-A-Cath removed 06/04/2014 8. Probable acute laryngopharyngeal dysesthesia following cycle 2 CAPOX. Symptoms resolved with warming.  9. Oxaliplatin neuropathy with  prolonged cold sensitivity.  She has persistent neuropathy symptoms involving the feet. Improved with Neurontin 10. History of anterior chest pain-she reports undergoing an evaluation by Dr. Debara Pickett. 11. 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   Disposition: Peggy French remains in remission from colon cancer. She will return for a follow-up visit and [name] in 6 months. She will contact the office in the interim with any problems. She knows to contact her gastroenterologist if she has any further rectal bleeding.   Patient seen with [name].    Ned Card ANP/GNP-BC   10/03/2014  10:02 AM This was a shared visit with Ned Card. Peggy French remains in clinical remission from colon cancer. She will return for an office visit in 6 months.  Julieanne Manson, M.D.

## 2014-11-06 ENCOUNTER — Other Ambulatory Visit: Payer: Self-pay | Admitting: Family Medicine

## 2014-11-06 ENCOUNTER — Other Ambulatory Visit (HOSPITAL_COMMUNITY)
Admission: RE | Admit: 2014-11-06 | Discharge: 2014-11-06 | Disposition: A | Discharge status: Home / Self Care | Payer: Medicare Other | Source: Ambulatory Visit | Attending: Family Medicine | Admitting: Family Medicine

## 2014-11-06 DIAGNOSIS — Z124 Encounter for screening for malignant neoplasm of cervix: Secondary | ICD-10-CM | POA: Insufficient documentation

## 2014-11-06 DIAGNOSIS — Z1151 Encounter for screening for human papillomavirus (HPV): Secondary | ICD-10-CM | POA: Insufficient documentation

## 2014-11-07 LAB — CYTOLOGY - PAP

## 2015-02-06 NOTE — Patient Instructions (Addendum)
ROMONDA PARKER  02/06/2015     '@PREFPERIOPPHARMACY'$ @   Your procedure is scheduled on 02/17/2015 .  Report to [facility] at 11:10 A.M.  Call this number if you have problems the morning of surgery:  [phone]   Remember:  Do not eat food or drink liquids after midnight.  Take these medicines the morning of surgery with A SIP OF WATER Amlodipine, Nexium, Allegra, Gabapentin, Altace and Propranolol   DO NOT TAKE TRADJENTA THE MORNING OF SURGERY   Do not wear jewelry, make-up or nail polish.  Do not wear lotions, powders, or perfumes.  You may wear deodorant.  Do not shave 48 hours prior to surgery.  Men may shave face and neck.  Do not bring valuables to the hospital.  [facility] is not responsible for any belongings or valuables.  Contacts, dentures or bridgework may not be worn into surgery.  Leave your suitcase in the car.  After surgery it may be brought to your room.  For patients admitted to the hospital, discharge time will be determined by your treatment team.  Patients discharged the day of surgery will not be allowed to drive home.    Please read over the following fact sheets that you were given. Anesthesia Post-op Instructions     PATIENT INSTRUCTIONS POST-ANESTHESIA  IMMEDIATELY FOLLOWING SURGERY:  Do not drive or operate machinery for the first twenty four hours after surgery.  Do not make any important decisions for twenty four hours after surgery or while taking narcotic pain medications or sedatives.  If you develop intractable nausea and vomiting or a severe headache please notify your doctor immediately.  FOLLOW-UP:  Please make an appointment with your surgeon as instructed. You do not need to follow up with anesthesia unless specifically instructed to do so.  WOUND CARE INSTRUCTIONS (if applicable):  Keep a dry clean dressing on the anesthesia/puncture wound site if there is drainage.  Once the wound has quit draining you may leave it open to air.   Generally you should leave the bandage intact for twenty four hours unless there is drainage.  If the epidural site drains for more than 36-48 hours please call the anesthesia department.  QUESTIONS?:  Please feel free to call your physician or the hospital operator if you have any questions, and they will be happy to assist you.      Cataract Surgery  A cataract is a clouding of the lens of the eye. When a lens becomes cloudy, vision is reduced based on the degree and nature of the clouding. Surgery may be needed to improve vision. Surgery removes the cloudy lens and usually replaces it with a substitute lens (intraocular lens, IOL). LET YOUR EYE DOCTOR KNOW ABOUT:  Allergies to food or medicine.  Medicines taken including herbs, eye drops, over-the-counter medicines, and creams.  Use of steroids (by mouth or creams).  Previous problems with anesthetics or numbing medicine.  History of bleeding problems or blood clots.  Previous surgery.  Other health problems, including diabetes and kidney problems.  Possibility of pregnancy, if this applies. RISKS AND COMPLICATIONS  Infection.  Inflammation of the eyeball (endophthalmitis) that can spread to both eyes (sympathetic ophthalmia).  Poor wound healing.  If an IOL is inserted, it can later fall out of proper position. This is very uncommon.  Clouding of the part of your eye that holds an IOL in place. This is called an "after-cataract." These are uncommon but easily treated. BEFORE THE PROCEDURE  Do not eat or drink anything except small amounts of water for 8 to 12 before your surgery, or as directed by your caregiver.  Unless you are told otherwise, continue any eye drops you have been prescribed.  Talk to your primary caregiver about all other medicines that you take (both prescription and nonprescription). In some cases, you may need to stop or change medicines near the time of your surgery. This is most important if you  are taking blood-thinning medicine.Do not stop medicines unless you are told to do so.  Arrange for someone to drive you to and from the procedure.  Do not put contact lenses in either eye on the day of your surgery. PROCEDURE There is more than one method for safely removing a cataract. Your doctor can explain the differences and help determine which is best for you. Phacoemulsification surgery is the most common form of cataract surgery.  An injection is given behind the eye or eye drops are given to make this a painless procedure.  A small cut (incision) is made on the edge of the clear, dome-shaped surface that covers the front of the eye (cornea).  A tiny probe is painlessly inserted into the eye. This device gives off ultrasound waves that soften and break up the cloudy center of the lens. This makes it easier for the cloudy lens to be removed by suction.  An IOL may be implanted.  The normal lens of the eye is covered by a clear capsule. Part of that capsule is intentionally left in the eye to support the IOL.  Your surgeon may or may not use stitches to close the incision. There are other forms of cataract surgery that require a larger incision and stitches to close the eye. This approach is taken in cases where the doctor feels that the cataract cannot be easily removed using phacoemulsification. AFTER THE PROCEDURE  When an IOL is implanted, it does not need care. It becomes a permanent part of your eye and cannot be seen or felt.  Your doctor will schedule follow-up exams to check on your progress.  Review your other medicines with your doctor to see which can be resumed after surgery.  Use eye drops or take medicine as prescribed by your doctor. Document Released: 06/17/2011 Document Revised: 11/12/2013 Document Reviewed: 06/17/2011 [facility] Patient Information 2015 [place], [place]. This information is not intended to replace advice given to you by your health care  provider. Make sure you discuss any questions you have with your health care provider.

## 2015-02-10 ENCOUNTER — Encounter (HOSPITAL_COMMUNITY)
Admission: RE | Admit: 2015-02-10 | Discharge: 2015-02-10 | Disposition: A | Discharge status: Home / Self Care | Payer: Medicare Other | Source: Ambulatory Visit | Attending: Ophthalmology | Admitting: Ophthalmology

## 2015-02-10 ENCOUNTER — Encounter (HOSPITAL_COMMUNITY): Payer: Self-pay

## 2015-02-10 DIAGNOSIS — Z01818 Encounter for other preprocedural examination: Secondary | ICD-10-CM | POA: Insufficient documentation

## 2015-02-10 DIAGNOSIS — H2512 Age-related nuclear cataract, left eye: Secondary | ICD-10-CM | POA: Diagnosis not present

## 2015-02-10 LAB — CBC WITH DIFFERENTIAL/PLATELET
BASOS ABS: 0 10*3/uL (ref 0.0–0.1)
Basophils Relative: 0 % (ref 0–1)
EOS ABS: 0.2 10*3/uL (ref 0.0–0.7)
Eosinophils Relative: 4 % (ref 0–5)
HCT: 39.2 % (ref 36.0–46.0)
HEMOGLOBIN: 13 g/dL (ref 12.0–15.0)
LYMPHS PCT: 36 % (ref 12–46)
Lymphs Abs: 1.6 10*3/uL (ref 0.7–4.0)
MCH: 26.6 pg (ref 26.0–34.0)
MCHC: 33.2 g/dL (ref 30.0–36.0)
MCV: 80.3 fL (ref 78.0–100.0)
MONO ABS: 0.3 10*3/uL (ref 0.1–1.0)
Monocytes Relative: 7 % (ref 3–12)
NEUTROS PCT: 53 % (ref 43–77)
Neutro Abs: 2.4 10*3/uL (ref 1.7–7.7)
PLATELETS: 234 10*3/uL (ref 150–400)
RBC: 4.88 MIL/uL (ref 3.87–5.11)
RDW: 14.4 % (ref 11.5–15.5)
WBC: 4.5 10*3/uL (ref 4.0–10.5)

## 2015-02-10 LAB — BASIC METABOLIC PANEL
Anion gap: 11 (ref 5–15)
BUN: 14 mg/dL (ref 6–20)
CO2: 26 mmol/L (ref 22–32)
Calcium: 8.5 mg/dL — ABNORMAL LOW (ref 8.9–10.3)
Chloride: 101 mmol/L (ref 101–111)
Creatinine, Ser: 1.09 mg/dL — ABNORMAL HIGH (ref 0.44–1.00)
GFR calc Af Amer: 57 mL/min — ABNORMAL LOW (ref >60)
GFR calc non Af Amer: 49 mL/min — ABNORMAL LOW (ref >60)
GLUCOSE: 142 mg/dL — AB (ref 65–99)
Potassium: 3.7 mmol/L (ref 3.5–5.1)
Sodium: 138 mmol/L (ref 135–145)

## 2015-02-14 MED ORDER — NEOMYCIN-POLYMYXIN-DEXAMETH 3.5-10000-0.1 OP SUSP
OPHTHALMIC | Status: AC
Start: 2015-02-14 — End: 2015-02-14
  Filled 2015-02-14: qty 5

## 2015-02-14 MED ORDER — PHENYLEPHRINE HCL 2.5 % OP SOLN
OPHTHALMIC | Status: AC
Start: 2015-02-14 — End: 2015-02-14
  Filled 2015-02-14: qty 15

## 2015-02-14 MED ORDER — TETRACAINE HCL 0.5 % OP SOLN
OPHTHALMIC | Status: AC
  Filled 2015-02-14: qty 2

## 2015-02-14 MED ORDER — LIDOCAINE HCL 3.5 % OP GEL
OPHTHALMIC | Status: AC
  Filled 2015-02-14: qty 1

## 2015-02-14 MED ORDER — CYCLOPENTOLATE-PHENYLEPHRINE OP SOLN OPTIME - NO CHARGE
OPHTHALMIC | Status: AC
  Filled 2015-02-14: qty 2

## 2015-02-14 MED ORDER — LIDOCAINE HCL (PF) 1 % IJ SOLN
INTRAMUSCULAR | Status: AC
  Filled 2015-02-14: qty 2

## 2015-02-17 ENCOUNTER — Encounter (HOSPITAL_COMMUNITY): Payer: Self-pay | Admitting: Ophthalmology

## 2015-02-17 ENCOUNTER — Ambulatory Visit (HOSPITAL_COMMUNITY): Payer: Medicare Other | Admitting: Anesthesiology

## 2015-02-17 ENCOUNTER — Ambulatory Visit (HOSPITAL_COMMUNITY)
Admission: RE | Admit: 2015-02-17 | Discharge: 2015-02-17 | Disposition: A | Discharge status: Home / Self Care | Payer: Medicare Other | Source: Ambulatory Visit | Attending: Ophthalmology | Admitting: Ophthalmology

## 2015-02-17 ENCOUNTER — Encounter (HOSPITAL_COMMUNITY)
Admission: RE | Disposition: A | Discharge status: Home / Self Care | Payer: Self-pay | Source: Ambulatory Visit | Attending: Ophthalmology

## 2015-02-17 DIAGNOSIS — E119 Type 2 diabetes mellitus without complications: Secondary | ICD-10-CM | POA: Insufficient documentation

## 2015-02-17 DIAGNOSIS — I10 Essential (primary) hypertension: Secondary | ICD-10-CM | POA: Diagnosis not present

## 2015-02-17 DIAGNOSIS — H25812 Combined forms of age-related cataract, left eye: Secondary | ICD-10-CM | POA: Diagnosis not present

## 2015-02-17 DIAGNOSIS — Z85038 Personal history of other malignant neoplasm of large intestine: Secondary | ICD-10-CM | POA: Insufficient documentation

## 2015-02-17 DIAGNOSIS — K219 Gastro-esophageal reflux disease without esophagitis: Secondary | ICD-10-CM | POA: Diagnosis not present

## 2015-02-17 DIAGNOSIS — Z79899 Other long term (current) drug therapy: Secondary | ICD-10-CM | POA: Diagnosis not present

## 2015-02-17 DIAGNOSIS — Z8589 Personal history of malignant neoplasm of other organs and systems: Secondary | ICD-10-CM | POA: Insufficient documentation

## 2015-02-17 HISTORY — PX: CATARACT EXTRACTION W/PHACO: SHX586

## 2015-02-17 LAB — GLUCOSE, CAPILLARY: GLUCOSE-CAPILLARY: 109 mg/dL — AB (ref 65–99)

## 2015-02-17 SURGERY — PHACOEMULSIFICATION, CATARACT, WITH IOL INSERTION
Anesthesia: Monitor Anesthesia Care | Site: Eye | Laterality: Left

## 2015-02-17 MED ORDER — EPINEPHRINE HCL 1 MG/ML IJ SOLN
INTRAMUSCULAR | Status: AC
  Filled 2015-02-17: qty 1

## 2015-02-17 MED ORDER — LIDOCAINE HCL (PF) 1 % IJ SOLN
INTRAMUSCULAR | Status: DC | PRN
  Administered 2015-02-17: .7 mL

## 2015-02-17 MED ORDER — PROVISC 10 MG/ML IO SOLN
INTRAOCULAR | Status: DC | PRN
  Administered 2015-02-17: 0.85 mL via INTRAOCULAR

## 2015-02-17 MED ORDER — NEOMYCIN-POLYMYXIN-DEXAMETH 3.5-10000-0.1 OP SUSP
OPHTHALMIC | Status: DC | PRN
  Administered 2015-02-17: 2 [drp] via OPHTHALMIC

## 2015-02-17 MED ORDER — CYCLOPENTOLATE-PHENYLEPHRINE 0.2-1 % OP SOLN
1.0000 [drp] | OPHTHALMIC | Status: AC
Start: 2015-02-17 — End: 2015-02-17
  Administered 2015-02-17 (×3): 1 [drp] via OPHTHALMIC

## 2015-02-17 MED ORDER — LIDOCAINE HCL 3.5 % OP GEL
1.0000 "application " | Freq: Once | OPHTHALMIC | Status: AC
  Administered 2015-02-17: 1 via OPHTHALMIC

## 2015-02-17 MED ORDER — MIDAZOLAM HCL 5 MG/5ML IJ SOLN
INTRAMUSCULAR | Status: AC
  Filled 2015-02-17: qty 5

## 2015-02-17 MED ORDER — BSS IO SOLN
INTRAOCULAR | Status: DC | PRN
  Administered 2015-02-17: 15 mL

## 2015-02-17 MED ORDER — FENTANYL CITRATE (PF) 100 MCG/2ML IJ SOLN
25.0000 ug | INTRAMUSCULAR | Status: AC
  Administered 2015-02-17 (×2): 25 ug via INTRAVENOUS
  Filled 2015-02-17: qty 2

## 2015-02-17 MED ORDER — POVIDONE-IODINE 5 % OP SOLN
OPHTHALMIC | Status: DC | PRN
  Administered 2015-02-17: 1 via OPHTHALMIC

## 2015-02-17 MED ORDER — LACTATED RINGERS IV SOLN
INTRAVENOUS | Status: DC
  Administered 2015-02-17: 13:00:00 via INTRAVENOUS

## 2015-02-17 MED ORDER — EPINEPHRINE HCL 1 MG/ML IJ SOLN
INTRAOCULAR | Status: DC | PRN
  Administered 2015-02-17: 500 mL

## 2015-02-17 MED ORDER — MIDAZOLAM HCL 2 MG/2ML IJ SOLN
1.0000 mg | INTRAMUSCULAR | Status: DC | PRN
  Administered 2015-02-17: 2 mg via INTRAVENOUS

## 2015-02-17 MED ORDER — TETRACAINE HCL 0.5 % OP SOLN
1.0000 [drp] | OPHTHALMIC | Status: AC
  Administered 2015-02-17 (×3): 1 [drp] via OPHTHALMIC

## 2015-02-17 MED ORDER — PHENYLEPHRINE HCL 2.5 % OP SOLN
1.0000 [drp] | OPHTHALMIC | Status: AC
  Administered 2015-02-17 (×3): 1 [drp] via OPHTHALMIC

## 2015-02-17 SURGICAL SUPPLY — 34 items
CAPSULAR TENSION RING-AMO (OPHTHALMIC RELATED) IMPLANT
CLOTH BEACON ORANGE TIMEOUT ST (SAFETY) ×3 IMPLANT
EYE SHIELD UNIVERSAL CLEAR (GAUZE/BANDAGES/DRESSINGS) ×3 IMPLANT
GLOVE BIO SURGEON STRL SZ 6.5 (GLOVE) IMPLANT
GLOVE BIO SURGEONS STRL SZ 6.5 (GLOVE)
GLOVE BIOGEL PI IND STRL 6.5 (GLOVE) IMPLANT
GLOVE BIOGEL PI IND STRL 7.0 (GLOVE) ×1 IMPLANT
GLOVE BIOGEL PI IND STRL 7.5 (GLOVE) IMPLANT
GLOVE BIOGEL PI INDICATOR 6.5 (GLOVE)
GLOVE BIOGEL PI INDICATOR 7.0 (GLOVE) ×2
GLOVE BIOGEL PI INDICATOR 7.5 (GLOVE)
GLOVE ECLIPSE 6.5 STRL STRAW (GLOVE) IMPLANT
GLOVE ECLIPSE 7.0 STRL STRAW (GLOVE) IMPLANT
GLOVE ECLIPSE 7.5 STRL STRAW (GLOVE) IMPLANT
GLOVE EXAM NITRILE LRG STRL (GLOVE) IMPLANT
GLOVE EXAM NITRILE MD LF STRL (GLOVE) ×3 IMPLANT
GLOVE SKINSENSE NS SZ6.5 (GLOVE)
GLOVE SKINSENSE NS SZ7.0 (GLOVE)
GLOVE SKINSENSE STRL SZ6.5 (GLOVE) IMPLANT
GLOVE SKINSENSE STRL SZ7.0 (GLOVE) IMPLANT
KIT VITRECTOMY (OPHTHALMIC RELATED) IMPLANT
PAD ARMBOARD 7.5X6 YLW CONV (MISCELLANEOUS) ×3 IMPLANT
PROC W NO LENS (INTRAOCULAR LENS)
PROC W SPEC LENS (INTRAOCULAR LENS)
PROCESS W NO LENS (INTRAOCULAR LENS) IMPLANT
PROCESS W SPEC LENS (INTRAOCULAR LENS) IMPLANT
RETRACTOR IRIS SIGHTPATH (OPHTHALMIC RELATED) IMPLANT
RING MALYGIN (MISCELLANEOUS) IMPLANT
SIGHTPATH CAT PROC W REG LENS (Ophthalmic Related) ×3 IMPLANT
SYRINGE LUER LOK 1CC (MISCELLANEOUS) ×3 IMPLANT
TAPE SURG TRANSPORE 1 IN (GAUZE/BANDAGES/DRESSINGS) ×1 IMPLANT
TAPE SURGICAL TRANSPORE 1 IN (GAUZE/BANDAGES/DRESSINGS) ×2
VISCOELASTIC ADDITIONAL (OPHTHALMIC RELATED) IMPLANT
WATER STERILE IRR 250ML POUR (IV SOLUTION) ×3 IMPLANT

## 2015-02-17 NOTE — Anesthesia Procedure Notes (Signed)
Procedure Name: MAC Date/Time: 02/17/2015 1:07 PM Performed by: Vista Deck Pre-anesthesia Checklist: Patient identified, Emergency Drugs available, Suction available, Timeout performed and Patient being monitored Patient Re-evaluated:Patient Re-evaluated prior to inductionOxygen Delivery Method: Nasal Cannula

## 2015-02-17 NOTE — H&P (Signed)
I have reviewed the H&P, the patient was re-examined, and I have identified no interval changes in medical condition and plan of care since the history and physical of record  

## 2015-02-17 NOTE — Anesthesia Postprocedure Evaluation (Signed)
  Anesthesia Post-op Note  Patient: Peggy French  Procedure(s) Performed: Procedure(s) (LRB): CATARACT EXTRACTION PHACO AND INTRAOCULAR LENS PLACEMENT (IOC) (Left)  Patient Location:  Short Stay  Anesthesia Type: MAC  Level of Consciousness: awake  Airway and Oxygen Therapy: Patient Spontanous Breathing  Post-op Pain: none  Post-op Assessment: Post-op Vital signs reviewed, Patient's Cardiovascular Status Stable, Respiratory Function Stable, Patent Airway, No signs of Nausea or vomiting and Pain level controlled  Post-op Vital Signs: Reviewed and stable  Complications: No apparent anesthesia complications

## 2015-02-17 NOTE — Discharge Instructions (Signed)

## 2015-02-17 NOTE — Op Note (Signed)
Date of Admission: 02/17/2015  Date of Surgery: 02/17/2015   Pre-Op Dx: Cataract Left Eye  Post-Op Dx: Senile Combined Cataract Left  Eye,  Dx Code Q28.638  Surgeon: Tonny Branch, M.D.  Assistants: None  Anesthesia: Topical with MAC  Indications: Painless, progressive loss of vision with compromise of daily activities.  Surgery: Cataract Extraction with Intraocular lens Implant Left Eye  Discription: The patient had dilating drops and viscous lidocaine placed into the Left eye in the pre-op holding area. After transfer to the operating room, a time out was performed. The patient was then prepped and draped. Beginning with a 8 degree blade a paracentesis port was made at the surgeon's 2 o'clock position. The anterior chamber was then filled with 1% non-preserved lidocaine. This was followed by filling the anterior chamber with Provisc.  A 2.61m keratome blade was used to make a clear corneal incision at the temporal limbus.  A bent cystatome needle was used to create a continuous tear capsulotomy. Hydrodissection was performed with balanced salt solution on a Fine canula. The lens nucleus was then removed using the phacoemulsification handpiece. Residual cortex was removed with the I&A handpiece. The anterior chamber and capsular bag were refilled with Provisc. A posterior chamber intraocular lens was placed into the capsular bag with it's injector. The implant was positioned with the Kuglan hook. The Provisc was then removed from the anterior chamber and capsular bag with the I&A handpiece. Stromal hydration of the main incision and paracentesis port was performed with BSS on a Fine canula. The wounds were tested for leak which was negative. The patient tolerated the procedure well. There were no operative complications. The patient was then transferred to the recovery room in stable condition.  Complications: None  Specimen: None  EBL: None  Prosthetic device: Hoya iSert 250, power 23.0 D, SN  [id number]

## 2015-02-17 NOTE — Transfer of Care (Signed)
Immediate Anesthesia Transfer of Care Note  Patient: Peggy French  Procedure(s) Performed: Procedure(s) (LRB): CATARACT EXTRACTION PHACO AND INTRAOCULAR LENS PLACEMENT (IOC) (Left)  Patient Location: Shortstay  Anesthesia Type: MAC  Level of Consciousness: awake  Airway & Oxygen Therapy: Patient Spontanous Breathing   Post-op Assessment: Report given to PACU RN, Post -op Vital signs reviewed and stable and Patient moving all extremities  Post vital signs: Reviewed and stable  Complications: No apparent anesthesia complications

## 2015-02-17 NOTE — Anesthesia Preprocedure Evaluation (Signed)
Anesthesia Evaluation  Patient identified by MRN, date of birth, ID band Patient awake    Reviewed: Allergy & Precautions, NPO status , Patient's Chart, lab work & pertinent test results  Airway Mallampati: II  TM Distance: >3 FB     Dental  (+) Edentulous Upper   Pulmonary asthma ,  Mets from colon CA breath sounds clear to auscultation        Cardiovascular hypertension, Pt. on medications + CAD Rhythm:Regular Rate:Normal     Neuro/Psych  Headaches,    GI/Hepatic hiatal hernia, GERD-  Medicated,  Endo/Other  diabetes, Type 2  Renal/GU      Musculoskeletal   Abdominal   Peds  Hematology   Anesthesia Other Findings   Reproductive/Obstetrics                             Anesthesia Physical Anesthesia Plan  ASA: IV  Anesthesia Plan: MAC   Post-op Pain Management:    Induction: Intravenous  Airway Management Planned: Nasal Cannula  Additional Equipment:   Intra-op Plan:   Post-operative Plan:   Informed Consent: I have reviewed the patients History and Physical, chart, labs and discussed the procedure including the risks, benefits and alternatives for the proposed anesthesia with the patient or authorized representative who has indicated his/her understanding and acceptance.     Plan Discussed with:   Anesthesia Plan Comments:         Anesthesia Quick Evaluation

## 2015-02-18 ENCOUNTER — Encounter (HOSPITAL_COMMUNITY): Payer: Self-pay | Admitting: Ophthalmology

## 2015-04-03 ENCOUNTER — Other Ambulatory Visit: Payer: Medicare Other

## 2015-04-04 ENCOUNTER — Other Ambulatory Visit: Payer: Medicare Other

## 2015-04-04 DIAGNOSIS — C7800 Secondary malignant neoplasm of unspecified lung: Principal | ICD-10-CM

## 2015-04-04 DIAGNOSIS — C189 Malignant neoplasm of colon, unspecified: Secondary | ICD-10-CM

## 2015-04-05 LAB — CEA: CEA: 2.2 ng/mL (ref 0.0–5.0)

## 2015-04-10 ENCOUNTER — Telehealth: Payer: Self-pay | Admitting: Oncology

## 2015-04-10 ENCOUNTER — Ambulatory Visit (HOSPITAL_BASED_OUTPATIENT_CLINIC_OR_DEPARTMENT_OTHER): Payer: Medicare Other | Admitting: Oncology

## 2015-04-10 VITALS — BP 144/48 | HR 84 | Temp 98.1°F | Resp 17 | Ht 64.0 in | Wt 218.1 lb

## 2015-04-10 DIAGNOSIS — G620 Drug-induced polyneuropathy: Secondary | ICD-10-CM | POA: Diagnosis not present

## 2015-04-10 DIAGNOSIS — E119 Type 2 diabetes mellitus without complications: Secondary | ICD-10-CM | POA: Diagnosis not present

## 2015-04-10 DIAGNOSIS — C182 Malignant neoplasm of ascending colon: Secondary | ICD-10-CM

## 2015-04-10 DIAGNOSIS — Z85038 Personal history of other malignant neoplasm of large intestine: Secondary | ICD-10-CM | POA: Diagnosis not present

## 2015-04-10 NOTE — Telephone Encounter (Signed)
Gave adn pritned appt sched and avs fo rpt for March 2017...did not want barium

## 2015-04-10 NOTE — Progress Notes (Signed)
[facility] OFFICE PROGRESS NOTE   Diagnosis: Colon cancer  INTERVAL HISTORY:   Peggy French returns as scheduled. She feels well. Good appetite and energy level. Neuropathy symptoms have improved.  Objective:  Vital signs in last 24 hours:  Blood pressure 144/48, pulse 84, temperature 98.1 F (36.7 C), temperature source Oral, resp. rate 17, height '5\' 4"'$  (1.626 m), weight 218 lb 1.6 oz (98.93 kg), SpO2 97 %.    HEENT: Neck without mass Lymphatics: No cervical, supraclavicular, axillary, or inguinal nodes Resp: Lungs clear bilaterally Cardio: Regular rate and rhythm no hepatosplenomegaly, no mass, nontender GI: No leg edema  Skin: Keloid at the previous Port-A-Cath site     Lab Results:  Lab Results  Component Value Date   WBC 4.5 02/10/2015   HGB 13.0 02/10/2015   HCT 39.2 02/10/2015   MCV 80.3 02/10/2015   PLT 234 02/10/2015   NEUTROABS 2.4 02/10/2015      Lab Results  Component Value Date   [name] 2.2 04/04/2015    Medications: I have reviewed the patient's current medications.  Assessment/plan:  1. 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 She began adjuvant CAPOX chemotherapy on 02/24/2012. She completed cycle 8 on 07/27/2012. Negative surveillance colonoscopy 12/08/2012 2. Delayed nausea following cycle 1 CAPOX. Aloxi was added beginning with cycle 2 with improvement. 3. Diabetes. 4. Microcytic anemia. Hemoglobin is normal. She will discontinue iron. 5. Remote history of a "colon tumor" removed endoscopically in 1980. 6. Left lower lung nodule noted on a CT 02/21/2012, slightly larger than on a CT 12/03/2011. Chest CT 06/12/2012 showed the previously seen 5 mm pulmonary nodule in left lower lobe was scarcely visualized, estimated at 3 mm. No other suspicious pulmonary nodules were identified. Chest CT 11/27/2012 showed interval enlargement of the left lower lobe pulmonary nodule with a current  measurement of 7 mm x 8 mm. No new pulmonary nodules. CT 02/22/2013 with enlargement of the anterior left lower lobe nodule and no other lung nodules. PET scan 03/02/2013 showed no associated hypermetabolism with the 12 mm left lower lobe pulmonary nodule. No hypermetabolic lymph nodes in the neck, chest, abdomen or pelvis. No abnormal hypermetabolic activity within the liver, pancreas, adrenal glands or spleen. Abnormal FDG accumulation in the posterior left nasopharyngeal mucosa.  Status post a wedge resection of the left lower lobe nodule on 03/27/2013 with the pathology confirming metastatic adenocarcinoma of colorectal primary.   Chest CT 07/18/2013-negative for recurrent disease.   Chest CT 09/19/2013 to evaluate for shortness of breath showed postoperative changes in the left lower lobe without evidence of recurrent or metastatic disease.  CT chest 04/01/2014 with postsurgical scarring in the left lower lobe. No evidence for metastatic disease in the chest. Stable appearance of mild subpleural reticulation of the lung bases.  CT chest/abdomen/pelvis 10/01/2014 with postoperative changes left lower lobe. No evidence of metastatic disease in the chest, abdomen or pelvis. 7. Status post Port-A-Cath placement 02/22/2012. Status post Port-A-Cath repositioning 03/14/2012. Port-A-Cath removed 06/04/2014 8. Probable acute laryngopharyngeal dysesthesia following cycle 2 CAPOX. Symptoms resolved with warming.  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 She has persistent neuropathy symptoms involving the feet. Improved with Neurontin 10. History of anterior chest pain-she reports undergoing an evaluation by Dr. Debara French. 11. 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   Disposition:  Peggy French remains in clinical remission from colon cancer. She will return for an office visit, [name], and restaging CT evaluation  in 6 months.  Peggy Coder,  MD  04/10/2015  10:34 AM

## 2015-06-16 ENCOUNTER — Observation Stay (HOSPITAL_COMMUNITY)
Admission: EM | Admit: 2015-06-16 | Discharge: 2015-06-17 | Disposition: A | Discharge status: Home / Self Care | Payer: Medicare Other | Attending: Internal Medicine | Admitting: Internal Medicine

## 2015-06-16 ENCOUNTER — Emergency Department (HOSPITAL_COMMUNITY): Payer: Medicare Other

## 2015-06-16 ENCOUNTER — Encounter (HOSPITAL_COMMUNITY): Payer: Self-pay | Admitting: Family Medicine

## 2015-06-16 DIAGNOSIS — Z9889 Other specified postprocedural states: Secondary | ICD-10-CM | POA: Diagnosis not present

## 2015-06-16 DIAGNOSIS — E119 Type 2 diabetes mellitus without complications: Secondary | ICD-10-CM | POA: Diagnosis not present

## 2015-06-16 DIAGNOSIS — G629 Polyneuropathy, unspecified: Secondary | ICD-10-CM | POA: Insufficient documentation

## 2015-06-16 DIAGNOSIS — I2510 Atherosclerotic heart disease of native coronary artery without angina pectoris: Secondary | ICD-10-CM | POA: Insufficient documentation

## 2015-06-16 DIAGNOSIS — G43909 Migraine, unspecified, not intractable, without status migrainosus: Secondary | ICD-10-CM | POA: Insufficient documentation

## 2015-06-16 DIAGNOSIS — E785 Hyperlipidemia, unspecified: Secondary | ICD-10-CM | POA: Diagnosis not present

## 2015-06-16 DIAGNOSIS — R079 Chest pain, unspecified: Principal | ICD-10-CM | POA: Insufficient documentation

## 2015-06-16 DIAGNOSIS — M109 Gout, unspecified: Secondary | ICD-10-CM | POA: Diagnosis not present

## 2015-06-16 DIAGNOSIS — Z79899 Other long term (current) drug therapy: Secondary | ICD-10-CM | POA: Insufficient documentation

## 2015-06-16 DIAGNOSIS — Z85038 Personal history of other malignant neoplasm of large intestine: Secondary | ICD-10-CM | POA: Diagnosis not present

## 2015-06-16 DIAGNOSIS — Z9049 Acquired absence of other specified parts of digestive tract: Secondary | ICD-10-CM

## 2015-06-16 DIAGNOSIS — M1990 Unspecified osteoarthritis, unspecified site: Secondary | ICD-10-CM | POA: Insufficient documentation

## 2015-06-16 DIAGNOSIS — K219 Gastro-esophageal reflux disease without esophagitis: Secondary | ICD-10-CM | POA: Insufficient documentation

## 2015-06-16 DIAGNOSIS — Z8619 Personal history of other infectious and parasitic diseases: Secondary | ICD-10-CM | POA: Insufficient documentation

## 2015-06-16 DIAGNOSIS — C7800 Secondary malignant neoplasm of unspecified lung: Secondary | ICD-10-CM | POA: Diagnosis not present

## 2015-06-16 DIAGNOSIS — E669 Obesity, unspecified: Secondary | ICD-10-CM | POA: Diagnosis present

## 2015-06-16 DIAGNOSIS — I10 Essential (primary) hypertension: Secondary | ICD-10-CM | POA: Diagnosis not present

## 2015-06-16 DIAGNOSIS — J45901 Unspecified asthma with (acute) exacerbation: Secondary | ICD-10-CM | POA: Insufficient documentation

## 2015-06-16 DIAGNOSIS — E1169 Type 2 diabetes mellitus with other specified complication: Secondary | ICD-10-CM

## 2015-06-16 HISTORY — DX: Iron deficiency anemia, unspecified: D50.9

## 2015-06-16 HISTORY — DX: Atherosclerotic heart disease of native coronary artery without angina pectoris: I25.10

## 2015-06-16 HISTORY — DX: Obesity, unspecified: E66.9

## 2015-06-16 LAB — BASIC METABOLIC PANEL
ANION GAP: 6 (ref 5–15)
BUN: 10 mg/dL (ref 6–20)
CALCIUM: 8.9 mg/dL (ref 8.9–10.3)
CO2: 29 mmol/L (ref 22–32)
Chloride: 103 mmol/L (ref 101–111)
Creatinine, Ser: 1.24 mg/dL — ABNORMAL HIGH (ref 0.44–1.00)
GFR, EST AFRICAN AMERICAN: 49 mL/min — AB (ref >60)
GFR, EST NON AFRICAN AMERICAN: 42 mL/min — AB (ref >60)
Glucose, Bld: 124 mg/dL — ABNORMAL HIGH (ref 65–99)
Potassium: 3.8 mmol/L (ref 3.5–5.1)
Sodium: 138 mmol/L (ref 135–145)

## 2015-06-16 LAB — CBC
HCT: 39.6 % (ref 36.0–46.0)
HEMOGLOBIN: 12.8 g/dL (ref 12.0–15.0)
MCH: 26.1 pg (ref 26.0–34.0)
MCHC: 32.3 g/dL (ref 30.0–36.0)
MCV: 80.7 fL (ref 78.0–100.0)
PLATELETS: 255 10*3/uL (ref 150–400)
RBC: 4.91 MIL/uL (ref 3.87–5.11)
RDW: 14.3 % (ref 11.5–15.5)
WBC: 4.9 10*3/uL (ref 4.0–10.5)

## 2015-06-16 LAB — I-STAT TROPONIN, ED: TROPONIN I, POC: 0 ng/mL (ref 0.00–0.08)

## 2015-06-16 LAB — BRAIN NATRIURETIC PEPTIDE: B Natriuretic Peptide: 17.1 pg/mL (ref 0.0–100.0)

## 2015-06-16 LAB — CBG MONITORING, ED: Glucose-Capillary: 91 mg/dL (ref 65–99)

## 2015-06-16 LAB — D-DIMER, QUANTITATIVE: D-Dimer, Quant: 1.32 ug/mL-FEU — ABNORMAL HIGH (ref 0.00–0.50)

## 2015-06-16 MED ORDER — SODIUM CHLORIDE 0.9 % IV BOLUS (SEPSIS)
1000.0000 mL | Freq: Once | INTRAVENOUS | Status: AC
  Administered 2015-06-16: 1000 mL via INTRAVENOUS

## 2015-06-16 MED ORDER — NITROGLYCERIN 0.4 MG SL SUBL
0.4000 mg | SUBLINGUAL_TABLET | SUBLINGUAL | Status: DC | PRN
  Administered 2015-06-16 (×2): 0.4 mg via SUBLINGUAL
  Filled 2015-06-16: qty 1

## 2015-06-16 MED ORDER — IOHEXOL 350 MG/ML SOLN
80.0000 mL | Freq: Once | INTRAVENOUS | Status: AC | PRN
  Administered 2015-06-16: 80 mL via INTRAVENOUS

## 2015-06-16 NOTE — ED Notes (Signed)
Pt here for SOB and chest pain. sts that she was walking into her regula Dr. appt today and felt SOB with walking and then started having chest pains. sts the pain has eased some

## 2015-06-16 NOTE — ED Provider Notes (Signed)
CSN: [id number]     Arrival date & time 06/16/15  1545 History   First MD Initiated Contact with Patient 06/16/15 1937     Chief Complaint  Patient presents with  . Chest Pain  . Shortness of Breath     (Consider location/radiation/quality/duration/timing/severity/associated sxs/prior Treatment) HPI  74 year old female presents with chest pain that started this afternoon. She was going to her PCPs office for a follow-up visit and when she was in the parking lot developed chest pressure while walking. Also having shortness of breath. Seemed to get progressively worse despite sitting down. Saw her PCP was sent to the ER. Patient states that on arrival to the ER the pain got worse but now has been progressively improving. He was a 10/10 earlier but now is only a 7/10. Feels like a heaviness in the middle of her chest. No pleuritic symptoms. No coughing or leg swelling. Had a cardiac cath in 2012 did not show any significant coronary disease.  Past Medical History  Diagnosis Date  . Gout   . Diabetes mellitus     x over 10 yrs  . Hyperlipidemia   . GERD (gastroesophageal reflux disease)   . Chest pain     NONE SINCE 2013  . H/O hiatal hernia   . Headache(784.0)     hx migraines  . Arthritis   . Asthmatic bronchitis 2011  . Hypertension     EKG 10/12 EPIC, chest- 1 view  6/13 EPIC    Last 2D Echo on 05/02/2012 showed EF of greater than 55%  . Coronary artery disease   . History of shingles   . History of colon cancer   . Neuropathy ([place])   . Cancer ([place])     LOV Dr Benay Spice 02/14/12 EPIC  . 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 Family history of adverse reaction to anesthesia     FAMILY MEMBERS HAVE HAD N/V   Past Surgical History  Procedure Laterality Date  . Cholecystectomy    . Abdominal hysterectomy    . Esophagogastroduodenoscopy  06/16/2011    Procedure: ESOPHAGOGASTRODUODENOSCOPY (EGD);  Surgeon: Rogene Houston, MD;  [facility]: [facility];  Service:  Endoscopy;  Laterality: N/A;  2:15  . Colonoscopy  11/26/2011    Procedure: COLONOSCOPY;  Surgeon: Rogene Houston, MD;  Location: [facility];  Service: Endoscopy;  Laterality: N/A;  2:15  . Appendectomy    . Portacath placement  02/22/2012    Procedure: INSERTION PORT-A-CATH;  Surgeon: Pedro Earls, MD;  Location: [facility];  Service: General;  Laterality: N/A;  left subclavian  . Colonoscopy N/A 12/08/2012    Procedure: COLONOSCOPY;  Surgeon: Rogene Houston, MD;  Location: [facility];  Service: Endoscopy;  Laterality: N/A;  815-moved to 0950 Ann to notify pt  . Video bronchoscopy N/A 03/27/2013    Procedure: VIDEO BRONCHOSCOPY;  Surgeon: Grace Isaac, MD;  Location: [facility];  Service: Thoracic;  Laterality: N/A;  . Video assisted thoracoscopy (vats)/wedge resection Left 03/27/2013    Procedure: VIDEO ASSISTED THORACOSCOPY (VATS)/WEDGE RESECTION;  Surgeon: Grace Isaac, MD;  Location: [facility];  Service: Thoracic;  Laterality: Left;  . Colon surgery      PARTIAL COLECTOMY 30 YRS / AGAIN 2013  . Port-a-cath removal N/A 06/04/2014    Procedure: REMOVAL PORT-A-CATH;  Surgeon: Pedro Earls, MD;  Location: [facility];  Service: General;  Laterality: N/A;  . Cardiac catheterization  7026,3785  . Cataract extraction w/phaco Left  02/17/2015    Procedure: CATARACT EXTRACTION PHACO AND INTRAOCULAR LENS PLACEMENT (IOC);  Surgeon: Tonny Branch, MD;  Location: [facility];  Service: Ophthalmology;  Laterality: Left;  CDE:8.01    Family History  Problem Relation Age of Onset  . Cancer Maternal Aunt     throat,    Social History  Substance Use Topics  . Smoking status: Never Smoker   . Smokeless tobacco: Never Used  . Alcohol Use: No   OB History    No data available     Review of Systems  Respiratory: Positive for shortness of breath.   Cardiovascular: Positive for chest pain. Negative for leg swelling.  Gastrointestinal: Negative for vomiting.  All other systems reviewed and are  negative.     Allergies  Aspirin  Home Medications   Prior to Admission medications   Medication Sig Start Date End Date Taking? Authorizing Provider  acetaminophen (TYLENOL) 500 MG tablet Take 1,000 mg by mouth every 6 (six) hours as needed for mild pain or moderate pain. For pain   Yes Historical Provider, MD  amLODipine (NORVASC) 5 MG tablet Take 5 mg by mouth every morning.    Yes Historical Provider, MD  carboxymethylcellulose (REFRESH PLUS) 0.5 % SOLN Place 2 drops into both eyes daily as needed (dry eyes).   Yes Historical Provider, MD  Cyanocobalamin (B-12 PO) Take 1 tablet by mouth daily.   Yes Historical Provider, MD  diphenoxylate-atropine (LOMOTIL) 2.5-0.025 MG tablet Take 1 tablet by mouth 4 (four) times daily as needed for diarrhea or loose stools.   Yes Historical Provider, MD  esomeprazole (NEXIUM) 40 MG capsule Take 40 mg by mouth every evening. 07/16/11  Yes Rogene Houston, MD  fexofenadine (ALLEGRA) 180 MG tablet Take 180 mg by mouth daily. For Allergies   Yes Historical Provider, MD  gabapentin (NEURONTIN) 100 MG capsule Take 100 mg by mouth at bedtime.    Yes Historical Provider, MD  linagliptin (TRADJENTA) 5 MG TABS tablet Take 5 mg by mouth daily after breakfast.    Yes Historical Provider, MD  propranolol-hydrochlorothiazide (INDERIDE) 40-25 MG per tablet Take 1 tablet by mouth daily with breakfast. 04/06/13  Yes Donielle Liston Alba, PA-C  ramipril (ALTACE) 5 MG capsule Take 5 mg by mouth daily with breakfast.    Yes Historical Provider, MD  simvastatin (ZOCOR) 40 MG tablet Take 40 mg by mouth at bedtime.    Yes Historical Provider, MD   BP 131/66 mmHg  Pulse 78  Temp(Src) 97.8 F (36.6 C) (Oral)  Resp 18  SpO2 98% Physical Exam  Constitutional: She is oriented to person, place, and time. She appears well-developed and well-nourished.  HENT:  Head: Normocephalic and atraumatic.  Right Ear: External ear normal.  Left Ear: External ear normal.  Nose: Nose  normal.  Eyes: Right eye exhibits no discharge. Left eye exhibits no discharge.  Cardiovascular: Normal rate, regular rhythm and normal heart sounds.   Pulmonary/Chest: Effort normal and breath sounds normal. She exhibits tenderness (mild sternal tenderness).  Abdominal: Soft. There is no tenderness.  Neurological: She is alert and oriented to person, place, and time.  Skin: Skin is warm and dry.  Nursing note and vitals reviewed.   ED Course  Procedures (including critical care time) Labs Review Labs Reviewed  BASIC METABOLIC PANEL - Abnormal; Notable for the following:    Glucose, Bld 124 (*)    Creatinine, Ser 1.24 (*)    GFR calc non Af Amer 42 (*)  GFR calc Af Amer 49 (*)    All other components within normal limits  D-DIMER, QUANTITATIVE (NOT AT [facility]) - Abnormal; Notable for the following:    D-Dimer, Quant 1.32 (*)    All other components within normal limits  CBC  BRAIN NATRIURETIC PEPTIDE  I-STAT TROPOININ, ED  CBG MONITORING, ED    Imaging Review Dg Chest 2 View  06/16/2015  CLINICAL DATA:  Intense chest pain for 1-2 hours radiating down all arm. Shortness of breath, nausea. EXAM: CHEST  2 VIEW COMPARISON:  Chest CT 10/01/2014 FINDINGS: Minimal left base scarring or atelectasis. Right lung is clear. Heart is normal size. No effusions or acute bony abnormality. IMPRESSION: Left base scarring or atelectasis.  No active disease. Electronically Signed   By: Rolm Baptise M.D.   On: 06/16/2015 16:31   Ct Angio Chest Pe W/cm &/or Wo Cm  06/16/2015  CLINICAL DATA:  Acute onset of shortness of breath and generalized chest pain. Stage III colon cancer, with metastases to the lungs. Initial encounter. EXAM: CT ANGIOGRAPHY CHEST WITH CONTRAST TECHNIQUE: Multidetector CT imaging of the chest was performed using the standard protocol during bolus administration of intravenous contrast. Multiplanar CT image reconstructions and MIPs were obtained to evaluate the vascular anatomy.  CONTRAST:  39m OMNIPAQUE IOHEXOL 350 MG/ML SOLN COMPARISON:  Chest radiograph performed earlier today at 4:25 p.m., and CT of the chest performed 10/01/2014 FINDINGS: There is no evidence of pulmonary embolus. Mild patchy bibasilar airspace opacities likely reflect atelectasis. Mild bronchiectasis is noted at the lower lung lobes. The lungs are otherwise grossly clear. There is no evidence of pleural effusion or pneumothorax. No masses are identified; no abnormal focal contrast enhancement is seen. The mediastinum is unremarkable in appearance. No mediastinal lymphadenopathy is seen. No pericardial effusion is identified. The great vessels are grossly unremarkable in appearance. No axillary lymphadenopathy is seen. The visualized portions of the thyroid gland are unremarkable in appearance. The visualized portions of the liver and spleen are unremarkable. The patient is status post cholecystectomy, with clips noted at the gallbladder fossa. A small hiatal hernia is noted. No acute osseous abnormalities are seen. Anterior bridging osteophytes are seen along the thoracic spine. Review of the MIP images confirms the above findings. IMPRESSION: 1. No evidence of pulmonary embolus. 2. Mild patchy bibasilar airspace opacities likely reflect atelectasis. Mild bilateral lower lobe bronchiectasis noted. Lungs otherwise clear. No lung metastases seen. 3. Small hiatal hernia seen. Electronically Signed   By: JGarald BaldingM.D.   On: 06/16/2015 23:36   I have personally reviewed and evaluated these images and lab results as part of my medical decision-making.   EKG Interpretation   Date/Time:  Monday June 16 2015 19:37:15 EST Ventricular Rate:  69 PR Interval:  188 QRS Duration: 81 QT Interval:  392 QTC Calculation: 420 R Axis:   40 Text Interpretation:  Sinus rhythm Low voltage, precordial leads Abnormal  R-wave progression, early transition Confirmed by Brilyn Tuller  MD, Cassie Shedlock  ([id number] on 06/16/2015 7:49:37  PM      MDM   Final diagnoses:  Chest pain, unspecified chest pain type    Patient with chest pain to start on exertion earlier today. Pain now resolved after 2 nitroglycerin. She has a reported allergy to aspirin thus was not given this. Given her history of lung cancer a CTA was obtained to rule out pulmonary embolism and is negative. I believe she needs to be further monitored in the hospital for an ACS rule out.  Stable for a floor admission on telemetry given pain is gone. Admit to hospitalist.    Sherwood Gambler, MD 06/17/15 [phone]

## 2015-06-16 NOTE — ED Notes (Signed)
Patient transported to CT 

## 2015-06-17 ENCOUNTER — Encounter (HOSPITAL_COMMUNITY): Payer: Medicare Other

## 2015-06-17 ENCOUNTER — Encounter (HOSPITAL_COMMUNITY): Payer: Self-pay | Admitting: Physician Assistant

## 2015-06-17 ENCOUNTER — Observation Stay (HOSPITAL_BASED_OUTPATIENT_CLINIC_OR_DEPARTMENT_OTHER): Payer: Medicare Other

## 2015-06-17 ENCOUNTER — Observation Stay (HOSPITAL_COMMUNITY): Payer: Medicare Other

## 2015-06-17 DIAGNOSIS — E119 Type 2 diabetes mellitus without complications: Secondary | ICD-10-CM | POA: Diagnosis not present

## 2015-06-17 DIAGNOSIS — E669 Obesity, unspecified: Secondary | ICD-10-CM | POA: Diagnosis present

## 2015-06-17 DIAGNOSIS — E785 Hyperlipidemia, unspecified: Secondary | ICD-10-CM | POA: Diagnosis not present

## 2015-06-17 DIAGNOSIS — R079 Chest pain, unspecified: Secondary | ICD-10-CM | POA: Diagnosis not present

## 2015-06-17 DIAGNOSIS — I10 Essential (primary) hypertension: Secondary | ICD-10-CM | POA: Diagnosis not present

## 2015-06-17 DIAGNOSIS — R072 Precordial pain: Secondary | ICD-10-CM | POA: Diagnosis not present

## 2015-06-17 DIAGNOSIS — K219 Gastro-esophageal reflux disease without esophagitis: Secondary | ICD-10-CM

## 2015-06-17 DIAGNOSIS — Z85038 Personal history of other malignant neoplasm of large intestine: Secondary | ICD-10-CM

## 2015-06-17 DIAGNOSIS — Z9049 Acquired absence of other specified parts of digestive tract: Secondary | ICD-10-CM

## 2015-06-17 DIAGNOSIS — E1169 Type 2 diabetes mellitus with other specified complication: Secondary | ICD-10-CM

## 2015-06-17 LAB — BASIC METABOLIC PANEL
Anion gap: 8 (ref 5–15)
BUN: 9 mg/dL (ref 6–20)
CALCIUM: 8.2 mg/dL — AB (ref 8.9–10.3)
CO2: 24 mmol/L (ref 22–32)
CREATININE: 1.03 mg/dL — AB (ref 0.44–1.00)
Chloride: 107 mmol/L (ref 101–111)
GFR calc Af Amer: >60 mL/min (ref >60)
GFR, EST NON AFRICAN AMERICAN: 53 mL/min — AB (ref >60)
GLUCOSE: 161 mg/dL — AB (ref 65–99)
Potassium: 3.2 mmol/L — ABNORMAL LOW (ref 3.5–5.1)
SODIUM: 139 mmol/L (ref 135–145)

## 2015-06-17 LAB — LIPID PANEL
CHOL/HDL RATIO: 3.3 ratio
Cholesterol: 133 mg/dL (ref 0–200)
HDL: 40 mg/dL — AB (ref >40)
LDL CALC: 79 mg/dL (ref 0–99)
TRIGLYCERIDES: 70 mg/dL (ref <150)
VLDL: 14 mg/dL (ref 0–40)

## 2015-06-17 LAB — NM MYOCAR MULTI W/SPECT W/WALL MOTION / EF
CHL CUP MPHR: 147 {beats}/min
CSEPEW: 1 METS
CSEPHR: 76 %
CSEPPHR: 112 {beats}/min
Exercise duration (min): 0 min
Exercise duration (sec): 0 s
Rest HR: 90 {beats}/min

## 2015-06-17 LAB — TROPONIN I
Troponin I: <0.03 ng/mL (ref <0.031)
Troponin I: <0.03 ng/mL (ref <0.031)

## 2015-06-17 LAB — GLUCOSE, CAPILLARY
GLUCOSE-CAPILLARY: 94 mg/dL (ref 65–99)
Glucose-Capillary: 108 mg/dL — ABNORMAL HIGH (ref 65–99)
Glucose-Capillary: 93 mg/dL (ref 65–99)
Glucose-Capillary: 143 mg/dL — ABNORMAL HIGH (ref 65–99)

## 2015-06-17 MED ORDER — RAMIPRIL 5 MG PO CAPS
5.0000 mg | ORAL_CAPSULE | Freq: Every day | ORAL | Status: DC
  Administered 2015-06-17: 5 mg via ORAL
  Filled 2015-06-17: qty 1

## 2015-06-17 MED ORDER — ATORVASTATIN CALCIUM 20 MG PO TABS
20.0000 mg | ORAL_TABLET | Freq: Every day | ORAL | Status: DC
  Administered 2015-06-17: 20 mg via ORAL
  Filled 2015-06-17: qty 1

## 2015-06-17 MED ORDER — ONDANSETRON HCL 4 MG/2ML IJ SOLN: 4.0000 mg | Freq: Four times a day (QID) | INTRAMUSCULAR | Status: DC | PRN

## 2015-06-17 MED ORDER — REGADENOSON 0.4 MG/5ML IV SOLN
INTRAVENOUS | Status: AC
  Filled 2015-06-17: qty 5

## 2015-06-17 MED ORDER — ACETAMINOPHEN 325 MG PO TABS
650.0000 mg | ORAL_TABLET | ORAL | Status: DC | PRN
Start: 2015-06-17 — End: 2015-06-17

## 2015-06-17 MED ORDER — GI COCKTAIL ~~LOC~~: 30.0000 mL | Freq: Four times a day (QID) | ORAL | Status: DC | PRN

## 2015-06-17 MED ORDER — SIMVASTATIN 40 MG PO TABS
40.0000 mg | ORAL_TABLET | Freq: Every day | ORAL | Status: DC
  Administered 2015-06-17: 40 mg via ORAL
  Filled 2015-06-17: qty 1

## 2015-06-17 MED ORDER — PANTOPRAZOLE SODIUM 40 MG PO TBEC
40.0000 mg | DELAYED_RELEASE_TABLET | Freq: Every day | ORAL | Status: DC
  Administered 2015-06-17: 40 mg via ORAL
  Filled 2015-06-17: qty 1

## 2015-06-17 MED ORDER — MORPHINE SULFATE (PF) 2 MG/ML IV SOLN: 2.0000 mg | INTRAVENOUS | Status: DC | PRN

## 2015-06-17 MED ORDER — ASPIRIN EC 81 MG PO TBEC: 81.0000 mg | DELAYED_RELEASE_TABLET | Freq: Every day | ORAL | Status: DC

## 2015-06-17 MED ORDER — AMLODIPINE BESYLATE 5 MG PO TABS
5.0000 mg | ORAL_TABLET | Freq: Every morning | ORAL | Status: DC
  Administered 2015-06-17: 5 mg via ORAL
  Filled 2015-06-17: qty 1

## 2015-06-17 MED ORDER — TECHNETIUM TC 99M SESTAMIBI GENERIC - CARDIOLITE
10.0000 | Freq: Once | INTRAVENOUS | Status: AC | PRN
  Administered 2015-06-17: 10 via INTRAVENOUS

## 2015-06-17 MED ORDER — INSULIN ASPART 100 UNIT/ML ~~LOC~~ SOLN
0.0000 [IU] | Freq: Three times a day (TID) | SUBCUTANEOUS | Status: DC
  Administered 2015-06-17: 1 [IU] via SUBCUTANEOUS

## 2015-06-17 MED ORDER — HYDROCHLOROTHIAZIDE 25 MG PO TABS: 25.0000 mg | ORAL_TABLET | Freq: Every day | ORAL | Status: DC

## 2015-06-17 MED ORDER — PROPRANOLOL-HCTZ 40-25 MG PO TABS: 1.0000 | ORAL_TABLET | Freq: Every day | ORAL | Status: DC

## 2015-06-17 MED ORDER — POLYVINYL ALCOHOL 1.4 % OP SOLN: 2.0000 [drp] | Freq: Every day | OPHTHALMIC | Status: DC | PRN

## 2015-06-17 MED ORDER — REGADENOSON 0.4 MG/5ML IV SOLN
0.4000 mg | Freq: Once | INTRAVENOUS | Status: AC
  Administered 2015-06-17: 0.4 mg via INTRAVENOUS
  Filled 2015-06-17: qty 5

## 2015-06-17 MED ORDER — ENOXAPARIN SODIUM 40 MG/0.4ML ~~LOC~~ SOLN
40.0000 mg | SUBCUTANEOUS | Status: DC
  Administered 2015-06-17: 40 mg via SUBCUTANEOUS
  Filled 2015-06-17: qty 0.4

## 2015-06-17 MED ORDER — PROPRANOLOL HCL 40 MG PO TABS
40.0000 mg | ORAL_TABLET | Freq: Every day | ORAL | Status: DC
  Administered 2015-06-17: 40 mg via ORAL
  Filled 2015-06-17: qty 1

## 2015-06-17 MED ORDER — SODIUM CHLORIDE 0.9 % IV SOLN
INTRAVENOUS | Status: DC
  Administered 2015-06-17: 02:00:00 via INTRAVENOUS

## 2015-06-17 MED ORDER — POTASSIUM CHLORIDE CRYS ER 20 MEQ PO TBCR
40.0000 meq | EXTENDED_RELEASE_TABLET | Freq: Every day | ORAL | Status: DC
  Administered 2015-06-17: 40 meq via ORAL
  Filled 2015-06-17: qty 2

## 2015-06-17 MED ORDER — DIPHENOXYLATE-ATROPINE 2.5-0.025 MG PO TABS: 1.0000 | ORAL_TABLET | Freq: Four times a day (QID) | ORAL | Status: DC | PRN

## 2015-06-17 MED ORDER — GABAPENTIN 100 MG PO CAPS
100.0000 mg | ORAL_CAPSULE | Freq: Every day | ORAL | Status: DC
  Administered 2015-06-17: 100 mg via ORAL
  Filled 2015-06-17: qty 1

## 2015-06-17 MED ORDER — LORATADINE 10 MG PO TABS
10.0000 mg | ORAL_TABLET | Freq: Every day | ORAL | Status: DC
  Administered 2015-06-17: 10 mg via ORAL
  Filled 2015-06-17: qty 1

## 2015-06-17 NOTE — H&P (Signed)
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 Patient was going to her PCP office around 2:45 PM this afternoon and states that she was running a little late. She walked into the office and sat down and began to have acute onset of sharp chest pain. Symptoms occurred in the middle of her chest and did not radiate. She notes pain was a 10 out of 10 at times, but waxed and waned. She was given 2 sublingual nitroglycerin which helped to ease off the pain. Her PCP ordered an immediate EKG and then advised her to come to the emergency department. Associated symptoms included some complaints of nausea, shortness of breath, and abdominal pain. Denied any vomiting, diaphoresis, diarrhea, or constipation. Patient currently denies any chest pain at this time.  Plan admission to the emergency department patient had a d-dimer which was slightly elevated at 1.32 and subsequently a CT angiogram which showed no signs of a pulmonary embolism. Initial troponins were 0.00 and EKG showed normal sinus rhythm.   Review of Systems  Constitutional: Negative for chills and malaise/fatigue.  HENT: Negative for hearing loss and tinnitus.   Eyes: Negative for double vision and photophobia.  Respiratory: Positive for shortness of breath. Negative for cough.   Cardiovascular: Positive for chest pain. Negative for leg swelling.  Gastrointestinal: Positive for nausea and abdominal pain. Negative for vomiting.  Genitourinary: Negative for urgency and frequency.  Musculoskeletal: Positive for joint pain. Negative for falls and neck pain.  Skin: Negative for itching and rash.   Neurological: Negative for speech change and focal weakness.  Endo/Heme/Allergies: Negative for environmental allergies and polydipsia.  Psychiatric/Behavioral: Negative for suicidal ideas and substance abuse.       Past Medical History  Diagnosis Date  . Gout   . Diabetes mellitus     x over 10 yrs  . Hyperlipidemia   . GERD (gastroesophageal reflux disease)   . Chest pain     NONE SINCE 2013  . H/O hiatal hernia   . Headache(784.0)     hx migraines  . Arthritis   . Asthmatic bronchitis 2011  . Hypertension     EKG 10/12 EPIC, chest- 1 view  6/13 EPIC    Last 2D Echo on 05/02/2012 showed EF of greater than 55%  . Coronary artery disease   . History of shingles   . History of colon cancer   . Neuropathy ([place])   . Cancer ([place])     LOV Dr Benay Spice 02/14/12 EPIC  . 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 Family history of adverse reaction to anesthesia     FAMILY MEMBERS HAVE HAD N/V     Past Surgical History  Procedure Laterality Date  . Cholecystectomy    . Abdominal hysterectomy    . Esophagogastroduodenoscopy  06/16/2011    Procedure: ESOPHAGOGASTRODUODENOSCOPY (EGD);  Surgeon: Rogene Houston, MD;  [facility];  Service: Endoscopy;  Laterality: N/A;  2:15  . Colonoscopy  11/26/2011    Procedure: COLONOSCOPY;  Surgeon: Rogene Houston, MD;  [facility];  Service: Endoscopy;  Laterality: N/A;  2:15  . Appendectomy    .  Portacath placement  02/22/2012    Procedure: INSERTION PORT-A-CATH;  Surgeon: Pedro Earls, MD;  Location: [facility];  Service: General;  Laterality: N/A;  left subclavian  . Colonoscopy N/A 12/08/2012    Procedure: COLONOSCOPY;  Surgeon: Rogene Houston, MD;  [facility];  Service: Endoscopy;  Laterality: N/A;  815-moved to 0950 Ann to notify pt  . Video bronchoscopy N/A 03/27/2013    Procedure: VIDEO BRONCHOSCOPY;  Surgeon: Grace Isaac, MD;  Location: [facility];  Service: Thoracic;  Laterality: N/A;   . Video assisted thoracoscopy (vats)/wedge resection Left 03/27/2013    Procedure: VIDEO ASSISTED THORACOSCOPY (VATS)/WEDGE RESECTION;  Surgeon: Grace Isaac, MD;  Location: [place];  Service: Thoracic;  Laterality: Left;  . Colon surgery      PARTIAL COLECTOMY 30 YRS / AGAIN 2013  . Port-a-cath removal N/A 06/04/2014    Procedure: REMOVAL PORT-A-CATH;  Surgeon: Pedro Earls, MD;  Location: [facility];  Service: General;  Laterality: N/A;  . Cardiac catheterization  [id number]  . Cataract extraction w/phaco Left 02/17/2015    Procedure: CATARACT EXTRACTION PHACO AND INTRAOCULAR LENS PLACEMENT (IOC);  Surgeon: Tonny Branch, MD;  Location: [facility];  Service: Ophthalmology;  Laterality: Left;  CDE:8.01       Social History:  reports that she has never smoked. She has never used smokeless tobacco. She reports that she does not drink alcohol or use illicit drugs. Where does patient live--home Can patient participate in ADLs? Yes  Allergies  Allergen Reactions  . Aspirin Palpitations    Family History  Problem Relation Age of Onset  . Cancer Maternal Aunt     throat,        Prior to Admission medications   Medication Sig Start Date End Date Taking? Authorizing Provider  acetaminophen (TYLENOL) 500 MG tablet Take 1,000 mg by mouth every 6 (six) hours as needed for mild pain or moderate pain. For pain   Yes Historical Provider, MD  amLODipine (NORVASC) 5 MG tablet Take 5 mg by mouth every morning.    Yes Historical Provider, MD  carboxymethylcellulose (REFRESH PLUS) 0.5 % SOLN Place 2 drops into both eyes daily as needed (dry eyes).   Yes Historical Provider, MD  Cyanocobalamin (B-12 PO) Take 1 tablet by mouth daily.   Yes Historical Provider, MD  diphenoxylate-atropine (LOMOTIL) 2.5-0.025 MG tablet Take 1 tablet by mouth 4 (four) times daily as needed for diarrhea or loose stools.   Yes Historical Provider, MD  esomeprazole (NEXIUM) 40 MG capsule Take 40 mg by mouth every evening. 07/16/11   Yes Rogene Houston, MD  fexofenadine (ALLEGRA) 180 MG tablet Take 180 mg by mouth daily. For Allergies   Yes Historical Provider, MD  gabapentin (NEURONTIN) 100 MG capsule Take 100 mg by mouth at bedtime.    Yes Historical Provider, MD  linagliptin (TRADJENTA) 5 MG TABS tablet Take 5 mg by mouth daily after breakfast.    Yes Historical Provider, MD  propranolol-hydrochlorothiazide (INDERIDE) 40-25 MG per tablet Take 1 tablet by mouth daily with breakfast. 04/06/13  Yes Donielle Liston Alba, PA-C  ramipril (ALTACE) 5 MG capsule Take 5 mg by mouth daily with breakfast.    Yes Historical Provider, MD  simvastatin (ZOCOR) 40 MG tablet Take 40 mg by mouth at bedtime.    Yes Historical Provider, MD     Physical Exam: Filed Vitals:   06/16/15 2115 06/16/15 2130 06/16/15 2145 06/16/15 2200  BP: 118/66 112/70 119/70 130/69  Pulse: 63  71  Temp:      TempSrc:      Resp: 20   16  SpO2: 99%   100%    Constitutional: Vital signs reviewed. Patient is a well-developed and well-nourished in no acute distress and cooperative with exam. Alert and oriented x3.  Head: Normocephalic and atraumatic  Ear: TM normal bilaterally  Mouth: no erythema or exudates, MMM  Eyes: PERRL, EOMI, conjunctivae normal, No scleral icterus.  Neck: Supple, Trachea midline normal ROM, No JVD, mass, thyromegaly, or carotid bruit present.  Cardiovascular: RRR, S1 normal, S2 normal, no MRG, pulses symmetric and intact bilaterally. Chest pain not reproducible on palpation Pulmonary/Chest: CTAB, no wheezes, rales, or rhonchi  Abdominal: Soft. Non-tender, non-distended, bowel sounds are normal, no masses, organomegaly, or guarding present.  GU: no CVA tenderness Musculoskeletal: No joint deformities, erythema, or stiffness, ROM full and no nontender Ext: no edema and no cyanosis, pulses palpable bilaterally (DP and PT)  Hematology: no cervical, inginal, or axillary adenopathy.  Neurological: A&O x3, Strenght is normal and  symmetric bilaterally, cranial nerve II-XII are grossly intact, no focal motor deficit, sensory intact to light touch bilaterally.  Skin: Warm, dry and intact. No rash, cyanosis, or clubbing.  Psychiatric: Normal mood and affect. speech and behavior is normal. Judgment and thought content normal. Cognition and memory are normal.      Data Review   Micro Results No results found for this or any previous visit (from the past 240 hour(s)).  Radiology Reports Dg Chest 2 View  06/16/2015  CLINICAL DATA:  Intense chest pain for 1-2 hours radiating down all arm. Shortness of breath, nausea. EXAM: CHEST  2 VIEW COMPARISON:  Chest CT 10/01/2014 FINDINGS: Minimal left base scarring or atelectasis. Right lung is clear. Heart is normal size. No effusions or acute bony abnormality. IMPRESSION: Left base scarring or atelectasis.  No active disease. Electronically Signed   By: Rolm Baptise M.D.   On: 06/16/2015 16:31   Ct Angio Chest Pe W/cm &/or Wo Cm  06/16/2015  CLINICAL DATA:  Acute onset of shortness of breath and generalized chest pain. Stage III colon cancer, with metastases to the lungs. Initial encounter. EXAM: CT ANGIOGRAPHY CHEST WITH CONTRAST TECHNIQUE: Multidetector CT imaging of the chest was performed using the standard protocol during bolus administration of intravenous contrast. Multiplanar CT image reconstructions and MIPs were obtained to evaluate the vascular anatomy. CONTRAST:  14m OMNIPAQUE IOHEXOL 350 MG/ML SOLN COMPARISON:  Chest radiograph performed earlier today at 4:25 p.m., and CT of the chest performed 10/01/2014 FINDINGS: There is no evidence of pulmonary embolus. Mild patchy bibasilar airspace opacities likely reflect atelectasis. Mild bronchiectasis is noted at the lower lung lobes. The lungs are otherwise grossly clear. There is no evidence of pleural effusion or pneumothorax. No masses are identified; no abnormal focal contrast enhancement is seen. The mediastinum is unremarkable  in appearance. No mediastinal lymphadenopathy is seen. No pericardial effusion is identified. The great vessels are grossly unremarkable in appearance. No axillary lymphadenopathy is seen. The visualized portions of the thyroid gland are unremarkable in appearance. The visualized portions of the liver and spleen are unremarkable. The patient is status post cholecystectomy, with clips noted at the gallbladder fossa. A small hiatal hernia is noted. No acute osseous abnormalities are seen. Anterior bridging osteophytes are seen along the thoracic spine. Review of the MIP images confirms the above findings. IMPRESSION: 1. No evidence of pulmonary embolus. 2. Mild patchy bibasilar airspace opacities likely reflect atelectasis. Mild bilateral lower  lobe bronchiectasis noted. Lungs otherwise clear. No lung metastases seen. 3. Small hiatal hernia seen. Electronically Signed   By: Garald Balding M.D.   On: 06/16/2015 23:36     CBC  Recent Labs Lab 06/16/15 1603  WBC 4.9  HGB 12.8  HCT 39.6  PLT 255  MCV 80.7  MCH 26.1  MCHC 32.3  RDW 14.3    Chemistries   Recent Labs Lab 06/16/15 1603  NA 138  K 3.8  CL 103  CO2 29  GLUCOSE 124*  BUN 10  CREATININE 1.24*  CALCIUM 8.9   ------------------------------------------------------------------------------------------------------------------ CrCl cannot be calculated (Unknown ideal weight.). ------------------------------------------------------------------------------------------------------------------ No results for input(s): HGBA1C in the last 72 hours. ------------------------------------------------------------------------------------------------------------------ No results for input(s): CHOL, HDL, LDLCALC, TRIG, CHOLHDL, LDLDIRECT in the last 72 hours. ------------------------------------------------------------------------------------------------------------------ No results for input(s): TSH, T4TOTAL, T3FREE, THYROIDAB in the last 72  hours.  Invalid input(s): FREET3 ------------------------------------------------------------------------------------------------------------------ No results for input(s): VITAMINB12, FOLATE, FERRITIN, TIBC, IRON, RETICCTPCT in the last 72 hours.  Coagulation profile No results for input(s): INR, PROTIME in the last 168 hours.   Recent Labs  06/16/15 2008  DDIMER 1.32*    Cardiac Enzymes No results for input(s): CKMB, TROPONINI, MYOGLOBIN in the last 168 hours.  Invalid input(s): CK ------------------------------------------------------------------------------------------------------------------ Invalid input(s): POCBNP   CBG:  Recent Labs Lab 06/16/15 2032  GLUCAP 91       EKG: Independently reviewed. Sinus rhythm with low voltage in the precordial leads, and abnormal R-wave progression.   Assessment/Plan Principal Problem: Chest pain: Acute. Patient with symptoms of acute onset of substernal left-sided chest pain which was relieved with nitroglycerin. Patient with a heart score of approximately 5. Initial troponin 0.0, EKG showing sinus rhythm with a normal R-wave progression. -Admit to telemetry bed -prn nitroglycerin and morphine -Troponins 3 every 6 hours -check lipid panel  -Repeat EKG in a.m. -Echo in am -Pt NPO after midnight evaluate for need of stress test in a.m. +/-  Cardiology consult.  Diabetes mellitus type 2 in obese: Stable initial blood glucose 124. -Discontinue home oral hypoglycemic agent -CBG checks every before meals and at bedtime -Patient placenta sensitive sliding scale of insulin   Mild renal insufficiency: Initial Cr 1.24 on admission. Baseline Cr around 1.09. -Placed on gentle IV fluids NS 75 ml/hour considering patient was given contrast -Continue to monitor repeat BMP in a.m.  Hypertension: Blood pressure mildly elevated at 155/70. -Continue ramipril, propranolol -Held HCTZ secondary to mild renal insufficiency     Hyperlipidemia -Checking lipid panel -Continue simvastatin    History of colon cancer with metastases S/P right colectomy: Patient reports being told that she was cancer free following previous surgeries for removal of her colon and lung nodules that were found. Patient also reports previously receiving chemotherapy for this issue.  GERD -Continue protonix  Code Status:   full Family Communication: bedside Disposition Plan: admit   Total time spent 55 minutes.Greater than 50% of this time was spent in counseling, explanation of diagnosis, planning of further management, and coordination of care  [facility] Pager [phone]  If 7PM-7AM, please contact night-coverage [email or web address] Password [email or web address] 06/17/2015, 12:44 AM

## 2015-06-17 NOTE — Progress Notes (Signed)
1 day lexiscan myoview completed without significant complication. Pending final result by [employer], [name] PA Pager: [phone]

## 2015-06-17 NOTE — Progress Notes (Signed)
  Echocardiogram 2D Echocardiogram has been performed.  Jennette Dubin 06/17/2015, 11:37 AM

## 2015-06-17 NOTE — Progress Notes (Signed)
  1. Myoview result noted  IMPRESSION: 1. No reversible ischemia or infarction.  2. Normal left ventricular wall motion.  3. Left ventricular ejection fraction 80%  4. Low-risk stress test findings*.  2. Echo result noted below:  LV EF: 55% -  60%  ------------------------------------------------------------------- Indications:   Chest pain 786.51.  ------------------------------------------------------------------- History:  PMH:  Coronary artery disease. Risk factors: Obesity. Hypertension. Diabetes mellitus. Dyslipidemia.  ------------------------------------------------------------------- Study Conclusions  - Left ventricle: The cavity size was normal. Wall thickness was normal. Systolic function was normal. The estimated ejection fraction was in the range of 55% to 60%. Doppler parameters are consistent with abnormal left ventricular relaxation (grade 1 diastolic dysfunction). - Aortic valve: Calcified non coronary cusp. - Atrial septum: No defect or patent foramen ovale was identified.  Both test result updated with patient. Informed IM regarding negative stress test.  Signed, [name] PA Pager: [phone]

## 2015-06-17 NOTE — Consult Note (Signed)
Cardiology Consultation Note  Patient ID: Peggy French, MRN: [id number], DOB/AGE: 08/02/1940 74 y.o. Admit date: 06/16/2015   Date of Consult: 06/17/2015 Primary Physician: Maggie Font, MD Primary Cardiologist: Dr. Debara Pickett  Chief Complaint: chest pain/SOB Reason for Consultation: chest pain/SOB   HPI: Peggy French is a 74 y/o sedentary AAF with history of HTN, DM2, obesity, colon CA s/p right hemicolectomy/chemo 2013 with mets to the lung s/p LLL wedge resection 2014, gout, minimal CAD by cath in 03/2011 whom we are asked to see for chest pain. Last ischemic eval was by cath by Dr. Debara Pickett in 03/2011 - 20-30% ostial bifurcation stenosis of LAD, otherwise only mild luminal irregularities in LAD/RCA, LVEF 60%, no RWMA.  She has chronic DOE which typically occurs with inclines. She presented to her PCP's office yesterday for routine f/u. While walking through the parking lot she noticed she she was SOB which was somewhat unusual given the flat terrain. She got into the doctor's office, sat down, and began experiencing "strong" chest pains towards her upper chest, felt like something sitting on her chest. At first she thought it was acid reflux. She had mild nausea. No diaphoresis or palpitations. She alerted the staff who brought her back to a room and eventually transferred her to [facility] She received 1 SL NTG with partial relief then a 2nd NTG relieved her pain totally. She has not had any recurrence since that time. She does report occasional left shoulder/arm pain without any particular pattern. She does not exercise. She does try to park further away from businesses to get in some walking when she can - denies any recent CP. This discomfort is similar to what happened to her in 2012 at time of cath.  Labs notable for troponin neg x 3, LDL 79, Cr 1.24 (prior 1.09), BNP 17, CBC wnl, d-dimer 1.32 with subsequent CTA showing no PE; +atx/mild bilateral lower lobe bronchiectasis, no lung mets seen, small  hiatal hernia.  Past Medical History  Diagnosis Date  . Gout   . Diabetes mellitus     x over 10 yrs  . Hyperlipidemia   . GERD (gastroesophageal reflux disease)   . H/O hiatal hernia   . Headache(784.0)     hx migraines  . Arthritis   . Asthmatic bronchitis 2011  . Hypertension     EKG 10/12 EPIC, chest- 1 view  6/13 EPIC    Last 2D Echo on 05/02/2012 showed EF of greater than 55%  . History of shingles   . History of colon cancer   . Neuropathy ([place])   . 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 s/p right hemicolectomy/chemo 2013 with mets to the lung s/p LLL wedge resection 2014.  . Family history of adverse reaction to anesthesia     FAMILY MEMBERS HAVE HAD N/V  . Mild CAD     a. cath by Dr. Debara Pickett in 03/2011 - 20-30% ostial bifurcation stenosis of LAD, otherwise only mild luminal irregularities in LAD/RCA, LVEF 60%, no RWMA.  . Microcytic anemia       Surgical History:  Past Surgical History  Procedure Laterality Date  . Cholecystectomy    . Abdominal hysterectomy    . Esophagogastroduodenoscopy  06/16/2011    Procedure: ESOPHAGOGASTRODUODENOSCOPY (EGD);  Surgeon: Rogene Houston, MD;  Location: [facility];  Service: Endoscopy;  Laterality: N/A;  2:15  . Colonoscopy  11/26/2011    Procedure: COLONOSCOPY;  Surgeon: Rogene Houston, MD;  Location: [facility];  Service: Endoscopy;  Laterality: N/A;  2:15  . Appendectomy    . Portacath placement  02/22/2012    Procedure: INSERTION PORT-A-CATH;  Surgeon: Pedro Earls, MD;  Location: [facility];  Service: General;  Laterality: N/A;  left subclavian  . Colonoscopy N/A 12/08/2012    Procedure: COLONOSCOPY;  Surgeon: Rogene Houston, MD;  [facility]: [facility];  Service: Endoscopy;  Laterality: N/A;  815-moved to 0950 Ann to notify pt  . Video bronchoscopy N/A 03/27/2013    Procedure: VIDEO BRONCHOSCOPY;  Surgeon: Grace Isaac, MD;  Location: [place];  Service: Thoracic;  Laterality: N/A;  . Video assisted  thoracoscopy (vats)/wedge resection Left 03/27/2013    Procedure: VIDEO ASSISTED THORACOSCOPY (VATS)/WEDGE RESECTION;  Surgeon: Grace Isaac, MD;  Location: [place];  Service: Thoracic;  Laterality: Left;  . Colon surgery      PARTIAL COLECTOMY 30 YRS / AGAIN 2013  . Port-a-cath removal N/A 06/04/2014    Procedure: REMOVAL PORT-A-CATH;  Surgeon: Pedro Earls, MD;  Location: [facility];  Service: General;  Laterality: N/A;  . Cardiac catheterization  6606,3016  . Cataract extraction w/phaco Left 02/17/2015    Procedure: CATARACT EXTRACTION PHACO AND INTRAOCULAR LENS PLACEMENT (IOC);  Surgeon: Tonny Branch, MD;  Location: [facility];  Service: Ophthalmology;  Laterality: Left;  CDE:8.01      Home Meds: Prior to Admission medications   Medication Sig Start Date End Date Taking? Authorizing Provider  acetaminophen (TYLENOL) 500 MG tablet Take 1,000 mg by mouth every 6 (six) hours as needed for mild pain or moderate pain. For pain   Yes Historical Provider, MD  amLODipine (NORVASC) 5 MG tablet Take 5 mg by mouth every morning.    Yes Historical Provider, MD  carboxymethylcellulose (REFRESH PLUS) 0.5 % SOLN Place 2 drops into both eyes daily as needed (dry eyes).   Yes Historical Provider, MD  Cyanocobalamin (B-12 PO) Take 1 tablet by mouth daily.   Yes Historical Provider, MD  diphenoxylate-atropine (LOMOTIL) 2.5-0.025 MG tablet Take 1 tablet by mouth 4 (four) times daily as needed for diarrhea or loose stools.   Yes Historical Provider, MD  esomeprazole (NEXIUM) 40 MG capsule Take 40 mg by mouth every evening. 07/16/11  Yes Rogene Houston, MD  fexofenadine (ALLEGRA) 180 MG tablet Take 180 mg by mouth daily. For Allergies   Yes Historical Provider, MD  gabapentin (NEURONTIN) 100 MG capsule Take 100 mg by mouth at bedtime.    Yes Historical Provider, MD  linagliptin (TRADJENTA) 5 MG TABS tablet Take 5 mg by mouth daily after breakfast.    Yes Historical Provider, MD  propranolol-hydrochlorothiazide  (INDERIDE) 40-25 MG per tablet Take 1 tablet by mouth daily with breakfast. 04/06/13  Yes Donielle Liston Alba, PA-C  ramipril (ALTACE) 5 MG capsule Take 5 mg by mouth daily with breakfast.    Yes Historical Provider, MD  simvastatin (ZOCOR) 40 MG tablet Take 40 mg by mouth at bedtime.    Yes Historical Provider, MD    Inpatient Medications:  . amLODipine  5 mg Oral q morning - 10a  . atorvastatin  20 mg Oral q1800  . enoxaparin (LOVENOX) injection  40 mg Subcutaneous Q24H  . gabapentin  100 mg Oral QHS  . insulin aspart  0-9 Units Subcutaneous TID WC  . loratadine  10 mg Oral Daily  . pantoprazole  40 mg Oral Daily  . propranolol  40 mg Oral Daily  . ramipril  5 mg Oral  Q breakfast   . sodium chloride 75 mL/hr at 06/17/15 0148    Allergies:  Allergies  Allergen Reactions  . Aspirin Palpitations    Social History   Social History  . Marital Status: Married    Spouse Name: N/A  . Number of Children: 4  . Years of Education: N/A   Occupational History  .     Social History Main Topics  . Smoking status: Never Smoker   . Smokeless tobacco: Never Used  . Alcohol Use: No  . Drug Use: No  . Sexual Activity: Not on file   Other Topics Concern  . Not on file   Social History Narrative   Married to husband, Herbie [place]   Retired Engineer, manufacturing systems     Family History  Problem Relation Age of Onset  . Cancer Maternal Aunt     throat,      Review of Systems: All other systems reviewed and are otherwise negative except as noted above.  Labs:  Recent Labs  06/17/15 0152 06/17/15 0502  TROPONINI <0.03 <0.03   Lab Results  Component Value Date   WBC 4.9 06/16/2015   HGB 12.8 06/16/2015   HCT 39.6 06/16/2015   MCV 80.7 06/16/2015   PLT 255 06/16/2015    Recent Labs Lab 06/17/15 0502  NA 139  K 3.2*  CL 107  CO2 24  BUN 9  CREATININE 1.03*  CALCIUM 8.2*  GLUCOSE 161*   Lab Results  Component Value Date   CHOL 133 06/17/2015   HDL 40* 06/17/2015    LDLCALC 79 06/17/2015   TRIG 70 06/17/2015   Lab Results  Component Value Date   DDIMER 1.32* 06/16/2015    Radiology/Studies:  Dg Chest 2 View  06/16/2015  CLINICAL DATA:  Intense chest pain for 1-2 hours radiating down all arm. Shortness of breath, nausea. EXAM: CHEST  2 VIEW COMPARISON:  Chest CT 10/01/2014 FINDINGS: Minimal left base scarring or atelectasis. Right lung is clear. Heart is normal size. No effusions or acute bony abnormality. IMPRESSION: Left base scarring or atelectasis.  No active disease. Electronically Signed   By: Rolm Baptise M.D.   On: 06/16/2015 16:31   Ct Angio Chest Pe W/cm &/or Wo Cm  06/16/2015  CLINICAL DATA:  Acute onset of shortness of breath and generalized chest pain. Stage III colon cancer, with metastases to the lungs. Initial encounter. EXAM: CT ANGIOGRAPHY CHEST WITH CONTRAST TECHNIQUE: Multidetector CT imaging of the chest was performed using the standard protocol during bolus administration of intravenous contrast. Multiplanar CT image reconstructions and MIPs were obtained to evaluate the vascular anatomy. CONTRAST:  40m OMNIPAQUE IOHEXOL 350 MG/ML SOLN COMPARISON:  Chest radiograph performed earlier today at 4:25 p.m., and CT of the chest performed 10/01/2014 FINDINGS: There is no evidence of pulmonary embolus. Mild patchy bibasilar airspace opacities likely reflect atelectasis. Mild bronchiectasis is noted at the lower lung lobes. The lungs are otherwise grossly clear. There is no evidence of pleural effusion or pneumothorax. No masses are identified; no abnormal focal contrast enhancement is seen. The mediastinum is unremarkable in appearance. No mediastinal lymphadenopathy is seen. No pericardial effusion is identified. The great vessels are grossly unremarkable in appearance. No axillary lymphadenopathy is seen. The visualized portions of the thyroid gland are unremarkable in appearance. The visualized portions of the liver and spleen are unremarkable. The  patient is status post cholecystectomy, with clips noted at the gallbladder fossa. A small hiatal hernia is noted. No acute osseous abnormalities are  seen. Anterior bridging osteophytes are seen along the thoracic spine. Review of the MIP images confirms the above findings. IMPRESSION: 1. No evidence of pulmonary embolus. 2. Mild patchy bibasilar airspace opacities likely reflect atelectasis. Mild bilateral lower lobe bronchiectasis noted. Lungs otherwise clear. No lung metastases seen. 3. Small hiatal hernia seen. Electronically Signed   By: Garald Balding M.D.   On: 06/16/2015 23:36    Wt Readings from Last 3 Encounters:  06/17/15 216 lb 9.6 oz (98.249 kg)  04/10/15 218 lb 1.6 oz (98.93 kg)  02/17/15 215 lb (97.523 kg)   EKG:  1) NSR 74bpm, generally low voltage - possible nonspecific changes but difficult to tell given baseline wander 2) NSR 69bpm, generally low voltage but otherwise no acute ST-T changes  Physical Exam: Blood pressure 146/77, pulse 78, temperature 97.8 F (36.6 C), temperature source Oral, resp. rate 18, height '5\' 4"'$  (1.626 m), weight 216 lb 9.6 oz (98.249 kg), SpO2 100 %. Body mass index is 37.16 kg/(m^2). General: Well developed, well nourished AAF, in no acute distress. Head: Normocephalic, atraumatic, sclera non-icteric, no xanthomas, nares are without discharge.  Neck: Negative for carotid bruits. JVD not elevated. Lungs: Clear bilaterally to auscultation without wheezes, rales, or rhonchi. Breathing is unlabored. Heart: RRR with S1 S2. No murmurs, rubs, or gallops appreciated. Abdomen: Soft, non-tender, non-distended with normoactive bowel sounds. No hepatomegaly. No rebound/guarding. No obvious abdominal masses. Msk:  Strength and tone appear normal for age. Extremities: No clubbing or cyanosis. No edema.  Distal pedal pulses are 2+ and equal bilaterally. Neuro: Alert and oriented X 3. No facial asymmetry. No focal deficit. Moves all extremities  spontaneously. Psych:  Responds to questions appropriately with a normal affect.    Assessment and Plan:   1. Chest pain/dyspnea - she has r/o for MI and BNP normal. CTA without evidence for PE. Will discuss further workup with MD. Chest pain is similar to when she had cath in 2012. ?Potential coronary vasospasm. The patient herself also wonders if this may be related to prior acid reflux she has had, which remains in the differential. Note h/o ASA intolerance 2/2 palpitations in the past.   2. Minimal CAD by cath in 03/2011 - see above. Continue BB, statin.  3. Essential HTN - BP mildly elevated overnight but otherwise normotensive at other times. Follow.  Signed, Charlie Pitter PA-C 06/17/2015, 7:41 AM Pager: [phone]

## 2015-06-17 NOTE — Discharge Summary (Signed)
PATIENT DETAILS Name: Peggy French Age: 74 y.o. Sex: female Date of Birth: [birth year] MRN: [id number]. Admitting Physician: Norval Morton, MD PNT:IRWE,RXVQMG K, MD  Admit Date: 06/16/2015 Discharge date: 06/17/2015  Recommendations for Outpatient Follow-up:   1. Please repeat CBC/BMET at next visit   PRIMARY DISCHARGE DIAGNOSIS:  Principal Problem:   Chest pain Active Problems:   Hypertension   Hyperlipidemia   S/P right colectomy   Diabetes mellitus type 2 in obese [facility])   Obesity      PAST MEDICAL HISTORY: Past Medical History  Diagnosis Date  . Gout   . Diabetes mellitus     x over 10 yrs  . Hyperlipidemia   . GERD (gastroesophageal reflux disease)   . H/O hiatal hernia   . Headache(784.0)     hx migraines  . Arthritis   . Asthmatic bronchitis 2011  . Hypertension     EKG 10/12 EPIC, chest- 1 view  6/13 EPIC    Last 2D Echo on 05/02/2012 showed EF of greater than 55%  . History of shingles   . History of colon cancer   . Neuropathy ([place])   . 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 s/p right hemicolectomy/chemo 2013 with mets to the lung s/p LLL wedge resection 2014.  . Family history of adverse reaction to anesthesia     FAMILY MEMBERS HAVE HAD N/V  . Mild CAD     a. cath by Dr. Debara Pickett in 03/2011 - 20-30% ostial bifurcation stenosis of LAD, otherwise only mild luminal irregularities in LAD/RCA, LVEF 60%, no RWMA.  . Microcytic anemia   . Obesity     DISCHARGE MEDICATIONS: Current Discharge Medication List    START taking these medications   Details  aspirin EC 81 MG tablet Take 1 tablet (81 mg total) by mouth daily.      CONTINUE these medications which have NOT CHANGED   Details  acetaminophen (TYLENOL) 500 MG tablet Take 1,000 mg by mouth every 6 (six) hours as needed for mild pain or moderate pain. For pain    amLODipine (NORVASC) 5 MG tablet Take 5 mg by mouth every morning.     carboxymethylcellulose (REFRESH PLUS)  0.5 % SOLN Place 2 drops into both eyes daily as needed (dry eyes).    Cyanocobalamin (B-12 PO) Take 1 tablet by mouth daily.    diphenoxylate-atropine (LOMOTIL) 2.5-0.025 MG tablet Take 1 tablet by mouth 4 (four) times daily as needed for diarrhea or loose stools.   Associated Diagnoses: Cancer of right colon ([place])    esomeprazole (NEXIUM) 40 MG capsule Take 40 mg by mouth every evening.    fexofenadine (ALLEGRA) 180 MG tablet Take 180 mg by mouth daily. For Allergies    gabapentin (NEURONTIN) 100 MG capsule Take 100 mg by mouth at bedtime.    Associated Diagnoses: Cancer of right colon ([place])    linagliptin (TRADJENTA) 5 MG TABS tablet Take 5 mg by mouth daily after breakfast.     propranolol-hydrochlorothiazide (INDERIDE) 40-25 MG per tablet Take 1 tablet by mouth daily with breakfast.    ramipril (ALTACE) 5 MG capsule Take 5 mg by mouth daily with breakfast.     simvastatin (ZOCOR) 40 MG tablet Take 40 mg by mouth at bedtime.         ALLERGIES:   Allergies  Allergen Reactions  . Aspirin Palpitations    BRIEF HPI:  See H&P, Labs, Consult and Test reports for all details in  brief, patient was admitted for evaluation of chest pain.  CONSULTATIONS:   cardiology  PERTINENT RADIOLOGIC STUDIES: Dg Chest 2 View  06/16/2015  CLINICAL DATA:  Intense chest pain for 1-2 hours radiating down all arm. Shortness of breath, nausea. EXAM: CHEST  2 VIEW COMPARISON:  Chest CT 10/01/2014 FINDINGS: Minimal left base scarring or atelectasis. Right lung is clear. Heart is normal size. No effusions or acute bony abnormality. IMPRESSION: Left base scarring or atelectasis.  No active disease. Electronically Signed   By: Rolm Baptise M.D.   On: 06/16/2015 16:31   Ct Angio Chest Pe W/cm &/or Wo Cm  06/16/2015  CLINICAL DATA:  Acute onset of shortness of breath and generalized chest pain. Stage III colon cancer, with metastases to the lungs. Initial encounter. EXAM: CT ANGIOGRAPHY CHEST WITH  CONTRAST TECHNIQUE: Multidetector CT imaging of the chest was performed using the standard protocol during bolus administration of intravenous contrast. Multiplanar CT image reconstructions and MIPs were obtained to evaluate the vascular anatomy. CONTRAST:  51m OMNIPAQUE IOHEXOL 350 MG/ML SOLN COMPARISON:  Chest radiograph performed earlier today at 4:25 p.m., and CT of the chest performed 10/01/2014 FINDINGS: There is no evidence of pulmonary embolus. Mild patchy bibasilar airspace opacities likely reflect atelectasis. Mild bronchiectasis is noted at the lower lung lobes. The lungs are otherwise grossly clear. There is no evidence of pleural effusion or pneumothorax. No masses are identified; no abnormal focal contrast enhancement is seen. The mediastinum is unremarkable in appearance. No mediastinal lymphadenopathy is seen. No pericardial effusion is identified. The great vessels are grossly unremarkable in appearance. No axillary lymphadenopathy is seen. The visualized portions of the thyroid gland are unremarkable in appearance. The visualized portions of the liver and spleen are unremarkable. The patient is status post cholecystectomy, with clips noted at the gallbladder fossa. A small hiatal hernia is noted. No acute osseous abnormalities are seen. Anterior bridging osteophytes are seen along the thoracic spine. Review of the MIP images confirms the above findings. IMPRESSION: 1. No evidence of pulmonary embolus. 2. Mild patchy bibasilar airspace opacities likely reflect atelectasis. Mild bilateral lower lobe bronchiectasis noted. Lungs otherwise clear. No lung metastases seen. 3. Small hiatal hernia seen. Electronically Signed   By: JGarald BaldingM.D.   On: 06/16/2015 23:36   Nm Myocar Multi W/spect W/wall Motion / Ef  06/17/2015  CLINICAL DATA:  Chest pain since 06/16/2015 EXAM: MYOCARDIAL IMAGING WITH SPECT (REST AND PHARMACOLOGIC-STRESS) GATED LEFT VENTRICULAR WALL MOTION STUDY LEFT VENTRICULAR  EJECTION FRACTION TECHNIQUE: Standard myocardial SPECT imaging was performed after resting intravenous injection of 10 mCi [id number]. Subsequently, intravenous infusion of Lexiscan was performed under the supervision of the Cardiology staff. At peak effect of the drug, 30 mCi [id number] was injected intravenously and standard myocardial SPECT imaging was performed. Quantitative gated imaging was also performed to evaluate left ventricular wall motion, and estimate left ventricular ejection fraction. COMPARISON:  None. FINDINGS: Perfusion: No decreased activity in the left ventricle on stress imaging to suggest reversible ischemia or infarction. Wall Motion: Normal left ventricular wall motion. No left ventricular dilation. Left Ventricular Ejection Fraction: 80 % End diastolic volume 50 ml End systolic volume 10 ml IMPRESSION: 1. No reversible ischemia or infarction. 2. Normal left ventricular wall motion. 3. Left ventricular ejection fraction 80% 4. Low-risk stress test findings*. *2012 Appropriate Use Criteria for Coronary Revascularization Focused Update: J Am Coll Cardiol. 2017902;40(9):[email or web address] Electronically Signed   By: P. Marijo SanesD.   On: 06/17/2015 16:32  PERTINENT LAB RESULTS: CBC:  Recent Labs  06/16/15 1603  WBC 4.9  HGB 12.8  HCT 39.6  PLT 255   CMET CMP     Component Value Date/Time   NA 139 06/17/2015 0502   NA 141 10/01/2014 1229   K 3.2* 06/17/2015 0502   K 3.8 10/01/2014 1229   CL 107 06/17/2015 0502   CL 101 11/27/2012 0807   CO2 24 06/17/2015 0502   CO2 28 10/01/2014 1229   GLUCOSE 161* 06/17/2015 0502   GLUCOSE 88 10/01/2014 1229   GLUCOSE 128* 11/27/2012 0807   BUN 9 06/17/2015 0502   BUN 11.4 10/01/2014 1229   CREATININE 1.03* 06/17/2015 0502   CREATININE 1.0 10/01/2014 1229   CREATININE 0.91 12/02/2011 1159   CALCIUM 8.2* 06/17/2015 0502   CALCIUM 9.1 10/01/2014 1229   PROT 8.0  10/01/2014 1229   PROT 7.2 03/29/2013 0345   ALBUMIN 3.4* 10/01/2014 1229   ALBUMIN 2.9* 03/29/2013 0345   AST 18 10/01/2014 1229   AST 26 03/29/2013 0345   ALT 14 10/01/2014 1229   ALT 16 03/29/2013 0345   ALKPHOS 115 10/01/2014 1229   ALKPHOS 107 03/29/2013 0345   BILITOT 0.26 10/01/2014 1229   BILITOT 0.5 03/29/2013 0345   GFRNONAA 53* 06/17/2015 0502   GFRAA >60 06/17/2015 0502    GFR Estimated Creatinine Clearance: 55.4 mL/min (by C-G formula based on Cr of 1.03). No results for input(s): LIPASE, AMYLASE in the last 72 hours.  Recent Labs  06/17/15 0152 06/17/15 0502 06/17/15 1128  TROPONINI <0.03 <0.03 <0.03   Invalid input(s): POCBNP  Recent Labs  06/16/15 2008  DDIMER 1.32*   No results for input(s): HGBA1C in the last 72 hours.  Recent Labs  06/17/15 0502  CHOL 133  HDL 40*  LDLCALC 79  TRIG 70  CHOLHDL 3.3   No results for input(s): TSH, T4TOTAL, T3FREE, THYROIDAB in the last 72 hours.  Invalid input(s): FREET3 No results for input(s): VITAMINB12, FOLATE, FERRITIN, TIBC, IRON, RETICCTPCT in the last 72 hours. Coags: No results for input(s): INR in the last 72 hours.  Invalid input(s): PT Microbiology: No results found for this or any previous visit (from the past 240 hour(s)).   BRIEF HOSPITAL COURSE:   Principal Problem: Chest pain:atypical,cardiac enzymes negative-seen by cards-underwent Nuc stress test that was negative. No further chest pain. CT Chest was negative for PE.   Active Problems: BMW:UXLKGM pre-renal azotemia-much improved by time of discharge.  Hypertension:controlled-continue Amlodipine,Ramipril and Propranolol  Hyperlipidemia:continue statin  Hx of Colon Cancer- S/P right colectomy:reviewed recent outpatient Oncology note-cancer in remission  TODAY-DAY OF DISCHARGE:  Subjective:   Georgana Curio today has no headache,no chest abdominal pain,no new weakness tingling or numbness, feels much better wants to go home  today.   Objective:   Blood pressure 129/60, pulse 77, temperature 97.8 F (36.6 C), temperature source Oral, resp. rate 20, height '5\' 4"'$  (1.626 m), weight 98.249 kg (216 lb 9.6 oz), SpO2 100 %.  Intake/Output Summary (Last 24 hours) at 06/17/15 1709 Last data filed at 06/17/15 0649  Gross per 24 hour  Intake 736.25 ml  Output    300 ml  Net 436.25 ml   Filed Weights   06/17/15 0109 06/17/15 0555  Weight: 98.748 kg (217 lb 11.2 oz) 98.249 kg (216 lb 9.6 oz)    Exam Awake Alert, Oriented *3, No new F.N deficits, Normal affect [place].AT,PERRAL Supple Neck,No JVD, No cervical lymphadenopathy appriciated.  Symmetrical Chest wall movement, Good air  movement bilaterally, CTAB RRR,No Gallops,Rubs or new Murmurs, No Parasternal Heave +ve B.Sounds, Abd Soft, Non tender, No organomegaly appriciated, No rebound -guarding or rigidity. No Cyanosis, Clubbing or edema, No new Rash or bruise  DISCHARGE CONDITION: Stable  DISPOSITION: Home  DISCHARGE INSTRUCTIONS:    Activity:  As tolerated   Get Medicines reviewed and adjusted: Please take all your medications with you for your next visit with your Primary MD  Please request your Primary MD to go over all hospital tests and procedure/radiological results at the follow up, please ask your Primary MD to get all Hospital records sent to his/her office.  If you experience worsening of your admission symptoms, develop shortness of breath, life threatening emergency, suicidal or homicidal thoughts you must seek medical attention immediately by calling 911 or calling your MD immediately  if symptoms less severe.  You must read complete instructions/literature along with all the possible adverse reactions/side effects for all the Medicines you take and that have been prescribed to you. Take any new Medicines after you have completely understood and accpet all the possible adverse reactions/side effects.   Do not drive when taking Pain medications.    Do not take more than prescribed Pain, Sleep and Anxiety Medications  Special Instructions: If you have smoked or chewed Tobacco  in the last 2 yrs please stop smoking, stop any regular Alcohol  and or any Recreational drug use.  Wear Seat belts while driving.  Please note  You were cared for by a hospitalist during your hospital stay. Once you are discharged, your primary care physician will handle any further medical issues. Please note that NO REFILLS for any discharge medications will be authorized once you are discharged, as it is imperative that you return to your primary care physician (or establish a relationship with a primary care physician if you do not have one) for your aftercare needs so that they can reassess your need for medications and monitor your lab values.   Diet recommendation: Diabetic Diet Heart Healthy diet  Discharge Instructions    Call MD for:  severe uncontrolled pain    Complete by:  As directed      Diet - low sodium heart healthy    Complete by:  As directed      Increase activity slowly    Complete by:  As directed            Follow-up Information    Follow up with [facility] K, MD. Schedule an appointment as soon as possible for a visit on 06/23/2015.   Specialty:  Family Medicine   Why:  @ 10:45   Contact information:   [address] [place] [address] [phone]      Total Time spent on discharge equals 25  minutes.  SignedOren Binet 06/17/2015 5:09 PM

## 2015-06-17 NOTE — Discharge Instructions (Signed)
Diabetes Mellitus and Food It is important for you to manage your blood sugar (glucose) level. Your blood glucose level can be greatly affected by what you eat. Eating healthier foods in the appropriate amounts throughout the day at about the same time each day will help you control your blood glucose level. It can also help slow or prevent worsening of your diabetes mellitus. Healthy eating may even help you improve the level of your blood pressure and reach or maintain a healthy weight.  General recommendations for healthful eating and cooking habits include:  Eating meals and snacks regularly. Avoid going long periods of time without eating to lose weight.  Eating a diet that consists mainly of plant-based foods, such as fruits, vegetables, nuts, legumes, and whole grains.  Using low-heat cooking methods, such as baking, instead of high-heat cooking methods, such as deep frying. Work with your dietitian to make sure you understand how to use the Nutrition Facts information on food labels. HOW CAN FOOD AFFECT ME? Carbohydrates Carbohydrates affect your blood glucose level more than any other type of food. Your dietitian will help you determine how many carbohydrates to eat at each meal and teach you how to count carbohydrates. Counting carbohydrates is important to keep your blood glucose at a healthy level, especially if you are using insulin or taking certain medicines for diabetes mellitus. Alcohol Alcohol can cause sudden decreases in blood glucose (hypoglycemia), especially if you use insulin or take certain medicines for diabetes mellitus. Hypoglycemia can be a life-threatening condition. Symptoms of hypoglycemia (sleepiness, dizziness, and disorientation) are similar to symptoms of having too much alcohol.  If your health care provider has given you approval to drink alcohol, do so in moderation and use the following guidelines:  Women should not have more than one drink per day, and men  should not have more than two drinks per day. One drink is equal to:  12 oz of beer.  5 oz of wine.  1 oz of hard liquor.  Do not drink on an empty stomach.  Keep yourself hydrated. Have water, diet soda, or unsweetened iced tea.  Regular soda, juice, and other mixers might contain a lot of carbohydrates and should be counted. WHAT FOODS ARE NOT RECOMMENDED? As you make food choices, it is important to remember that all foods are not the same. Some foods have fewer nutrients per serving than other foods, even though they might have the same number of calories or carbohydrates. It is difficult to get your body what it needs when you eat foods with fewer nutrients. Examples of foods that you should avoid that are high in calories and carbohydrates but low in nutrients include:  Trans fats (most processed foods list trans fats on the Nutrition Facts label).  Regular soda.  Juice.  Candy.  Sweets, such as cake, pie, doughnuts, and cookies.  Fried foods. WHAT FOODS CAN I EAT? Eat nutrient-rich foods, which will nourish your body and keep you healthy. The food you should eat also will depend on several factors, including:  The calories you need.  The medicines you take.  Your weight.  Your blood glucose level.  Your blood pressure level.  Your cholesterol level. You should eat a variety of foods, including:  Protein.  Lean cuts of meat.  Proteins low in saturated fats, such as fish, egg whites, and beans. Avoid processed meats.  Fruits and vegetables.  Fruits and vegetables that may help control blood glucose levels, such as apples, mangoes, and  yams.  Dairy products.  Choose fat-free or low-fat dairy products, such as milk, yogurt, and cheese.  Grains, bread, pasta, and rice.  Choose whole grain products, such as multigrain bread, whole oats, and brown rice. These foods may help control blood pressure.  Fats.  Foods containing healthful fats, such as nuts,  avocado, olive oil, canola oil, and fish. DOES EVERYONE WITH DIABETES MELLITUS HAVE THE SAME MEAL PLAN? Because every person with diabetes mellitus is different, there is not one meal plan that works for everyone. It is very important that you meet with a dietitian who will help you create a meal plan that is just right for you.   This information is not intended to replace advice given to you by your health care provider. Make sure you discuss any questions you have with your health care provider.   Document Released: 03/25/2005 Document Revised: 07/19/2014 Document Reviewed: 05/25/2013 Elsevier Interactive Patient Education 2016 Elsevier Inc.   Nonspecific Chest Pain It is often hard to find the cause of chest pain. There is always a chance that your pain could be related to something serious, such as a heart attack or a blood clot in your lungs. Chest pain can also be caused by conditions that are not life-threatening. If you have chest pain, it is very important to follow up with your doctor.  HOME CARE  If you were prescribed an antibiotic medicine, finish it all even if you start to feel better.  Avoid any activities that cause chest pain.  Do not use any tobacco products, including cigarettes, chewing tobacco, or electronic cigarettes. If you need help quitting, ask your doctor.  Do not drink alcohol.  Take medicines only as told by your doctor.  Keep all follow-up visits as told by your doctor. This is important. This includes any further testing if your chest pain does not go away.  Your doctor may tell you to keep your head raised (elevated) while you sleep.  Make lifestyle changes as told by your doctor. These may include:  Getting regular exercise. Ask your doctor to suggest some activities that are safe for you.  Eating a heart-healthy diet. Your doctor or a diet specialist (dietitian) can help you to learn healthy eating options.  Maintaining a healthy  weight.  Managing diabetes, if necessary.  Reducing stress. GET HELP IF:  Your chest pain does not go away, even after treatment.  You have a rash with blisters on your chest.  You have a fever. GET HELP RIGHT AWAY IF:  Your chest pain is worse.  You have an increasing cough, or you cough up blood.  You have severe belly (abdominal) pain.  You feel extremely weak.  You pass out (faint).  You have chills.  You have sudden, unexplained chest discomfort.  You have sudden, unexplained discomfort in your arms, back, neck, or jaw.  You have shortness of breath at any time.  You suddenly start to sweat, or your skin gets clammy.  You feel nauseous.  You vomit.  You suddenly feel light-headed or dizzy.  Your heart begins to beat quickly, or it feels like it is skipping beats. These symptoms may be an emergency. Do not wait to see if the symptoms will go away. Get medical help right away. Call your local emergency services (911 in the [place] Do not drive yourself to the hospital.   This information is not intended to replace advice given to you by your health care provider. Make sure you  discuss any questions you have with your health care provider.   Document Released: 12/15/2007 Document Revised: 07/19/2014 Document Reviewed: 02/01/2014 Elsevier Interactive Patient Education [employer].

## 2015-10-01 ENCOUNTER — Telehealth: Payer: Self-pay | Admitting: Medical Oncology

## 2015-10-01 ENCOUNTER — Telehealth: Payer: Self-pay | Admitting: Oncology

## 2015-10-01 NOTE — Telephone Encounter (Signed)
Sent order to schedulers to move visit out 2 weeks. Managed care is working on prior [place] for CT. Pt to have lab day of CT.

## 2015-10-01 NOTE — Telephone Encounter (Signed)
Pt.  has the flu and is cancelling her appt tomorrow. She has not had a scan scheduled. Will notify RN

## 2015-10-01 NOTE — Telephone Encounter (Signed)
Spoke with patient to confirm appt date/times per 3/22 pof

## 2015-10-02 ENCOUNTER — Other Ambulatory Visit: Payer: Medicare Other

## 2015-10-03 ENCOUNTER — Ambulatory Visit: Payer: Medicare Other | Admitting: Nurse Practitioner

## 2015-10-08 ENCOUNTER — Other Ambulatory Visit: Payer: Self-pay | Admitting: *Deleted

## 2015-10-08 ENCOUNTER — Telehealth: Payer: Self-pay | Admitting: Oncology

## 2015-10-08 ENCOUNTER — Ambulatory Visit (HOSPITAL_COMMUNITY): Admission: RE | Admit: 2015-10-08 | Payer: Medicare Other | Source: Ambulatory Visit

## 2015-10-08 ENCOUNTER — Other Ambulatory Visit: Payer: Medicare Other

## 2015-10-08 DIAGNOSIS — C189 Malignant neoplasm of colon, unspecified: Secondary | ICD-10-CM

## 2015-10-08 DIAGNOSIS — C7800 Secondary malignant neoplasm of unspecified lung: Secondary | ICD-10-CM

## 2015-10-08 DIAGNOSIS — C182 Malignant neoplasm of ascending colon: Secondary | ICD-10-CM

## 2015-10-08 NOTE — Telephone Encounter (Signed)
s.w pt and advised on april appt....pt ok and aware

## 2015-10-13 ENCOUNTER — Ambulatory Visit: Payer: Medicare Other | Admitting: Nurse Practitioner

## 2015-10-22 ENCOUNTER — Ambulatory Visit (HOSPITAL_COMMUNITY): Payer: Medicare Other

## 2015-10-22 ENCOUNTER — Telehealth: Payer: Self-pay | Admitting: Nurse Practitioner

## 2015-10-22 ENCOUNTER — Other Ambulatory Visit: Payer: Medicare Other

## 2015-10-22 NOTE — Telephone Encounter (Signed)
pt cld left voicemail to CX lab and MD appt/CT appt-CX appts-sent Lattie Haw T email to adv a new pof may need to be sent to r/s pt appts

## 2015-10-27 ENCOUNTER — Other Ambulatory Visit: Payer: Self-pay | Admitting: Nurse Practitioner

## 2015-10-27 ENCOUNTER — Telehealth: Payer: Self-pay | Admitting: Oncology

## 2015-10-27 NOTE — Telephone Encounter (Signed)
pt is not ready to sched appt son just passed...she will call us back to sched

## 2015-10-27 NOTE — Telephone Encounter (Signed)
pt returned call and sched f/u...pt ok and aware of new d.t

## 2015-10-31 ENCOUNTER — Other Ambulatory Visit: Payer: Medicare Other

## 2015-11-03 ENCOUNTER — Encounter (HOSPITAL_COMMUNITY): Payer: Self-pay

## 2015-11-03 ENCOUNTER — Ambulatory Visit (HOSPITAL_COMMUNITY)
Admission: RE | Admit: 2015-11-03 | Discharge: 2015-11-03 | Disposition: A | Discharge status: Home / Self Care | Payer: Medicare Other | Source: Ambulatory Visit | Attending: Oncology | Admitting: Oncology

## 2015-11-03 ENCOUNTER — Ambulatory Visit: Payer: Medicare Other | Admitting: Nurse Practitioner

## 2015-11-03 ENCOUNTER — Other Ambulatory Visit (HOSPITAL_BASED_OUTPATIENT_CLINIC_OR_DEPARTMENT_OTHER): Payer: Medicare Other

## 2015-11-03 DIAGNOSIS — Z85038 Personal history of other malignant neoplasm of large intestine: Secondary | ICD-10-CM

## 2015-11-03 DIAGNOSIS — M4806 Spinal stenosis, lumbar region: Secondary | ICD-10-CM | POA: Diagnosis not present

## 2015-11-03 DIAGNOSIS — C182 Malignant neoplasm of ascending colon: Secondary | ICD-10-CM | POA: Diagnosis present

## 2015-11-03 DIAGNOSIS — J479 Bronchiectasis, uncomplicated: Secondary | ICD-10-CM | POA: Diagnosis not present

## 2015-11-03 DIAGNOSIS — I700 Atherosclerosis of aorta: Secondary | ICD-10-CM | POA: Diagnosis not present

## 2015-11-03 DIAGNOSIS — C7800 Secondary malignant neoplasm of unspecified lung: Secondary | ICD-10-CM

## 2015-11-03 DIAGNOSIS — Z9049 Acquired absence of other specified parts of digestive tract: Secondary | ICD-10-CM | POA: Diagnosis not present

## 2015-11-03 DIAGNOSIS — K449 Diaphragmatic hernia without obstruction or gangrene: Secondary | ICD-10-CM | POA: Insufficient documentation

## 2015-11-03 DIAGNOSIS — K760 Fatty (change of) liver, not elsewhere classified: Secondary | ICD-10-CM | POA: Diagnosis not present

## 2015-11-03 DIAGNOSIS — E049 Nontoxic goiter, unspecified: Secondary | ICD-10-CM | POA: Diagnosis not present

## 2015-11-03 DIAGNOSIS — C189 Malignant neoplasm of colon, unspecified: Secondary | ICD-10-CM

## 2015-11-03 LAB — BASIC METABOLIC PANEL
Anion Gap: 10 mEq/L (ref 3–11)
BUN: 10.6 mg/dL (ref 7.0–26.0)
CHLORIDE: 101 meq/L (ref 98–109)
CO2: 29 meq/L (ref 22–29)
Calcium: 9.2 mg/dL (ref 8.4–10.4)
Creatinine: 1 mg/dL (ref 0.6–1.1)
EGFR: 64 mL/min/{1.73_m2} — AB (ref >90)
GLUCOSE: 138 mg/dL (ref 70–140)
Potassium: 3.2 mEq/L — ABNORMAL LOW (ref 3.5–5.1)
SODIUM: 140 meq/L (ref 136–145)

## 2015-11-03 MED ORDER — IOPAMIDOL (ISOVUE-300) INJECTION 61%
100.0000 mL | Freq: Once | INTRAVENOUS | Status: AC | PRN
  Administered 2015-11-03: 100 mL via INTRAVENOUS

## 2015-11-04 LAB — CEA: CEA1: 3.8 ng/mL (ref 0.0–4.7)

## 2015-11-04 LAB — CEA (PARALLEL TESTING): CEA: 2 ng/mL

## 2015-11-06 ENCOUNTER — Telehealth: Payer: Self-pay | Admitting: Oncology

## 2015-11-06 ENCOUNTER — Ambulatory Visit: Payer: Medicare Other | Admitting: Nurse Practitioner

## 2015-11-06 ENCOUNTER — Ambulatory Visit (HOSPITAL_BASED_OUTPATIENT_CLINIC_OR_DEPARTMENT_OTHER): Payer: Medicare Other | Admitting: Nurse Practitioner

## 2015-11-06 VITALS — BP 146/61 | HR 78 | Temp 98.0°F | Resp 17 | Ht 64.0 in | Wt 214.5 lb

## 2015-11-06 DIAGNOSIS — I10 Essential (primary) hypertension: Secondary | ICD-10-CM

## 2015-11-06 DIAGNOSIS — Z85038 Personal history of other malignant neoplasm of large intestine: Secondary | ICD-10-CM | POA: Diagnosis not present

## 2015-11-06 DIAGNOSIS — C7800 Secondary malignant neoplasm of unspecified lung: Principal | ICD-10-CM

## 2015-11-06 DIAGNOSIS — C189 Malignant neoplasm of colon, unspecified: Secondary | ICD-10-CM

## 2015-11-06 MED ORDER — POTASSIUM CHLORIDE CRYS ER 20 MEQ PO TBCR: 20.0000 meq | EXTENDED_RELEASE_TABLET | Freq: Every day | ORAL | Status: DC

## 2015-11-06 NOTE — Telephone Encounter (Signed)
Gave pt appt for oct & avs

## 2015-11-06 NOTE — Progress Notes (Addendum)
[facility] OFFICE PROGRESS NOTE   Diagnosis:   Colon cancer  INTERVAL HISTORY:   Peggy French returns as scheduled. She noted some diarrhea following the contrast for the recent CT scan. Prior to the CT scan bowels moving regularly. She had a single episode of nausea/vomiting the day after the CT scan. No abdominal pain. She has had a recent decline in appetite. This coincides with the unexpected death of her son following a massive stroke.   Objective:  Vital signs in last 24 hours:  Blood pressure 146/61, pulse 78, temperature 98 F (36.7 C), temperature source Oral, resp. rate 17, height '5\' 4"'$  (1.626 m), weight 214 lb 8 oz (97.297 kg), SpO2 97 %.    HEENT: No thrush or ulcers. Lymphatics: No palpable cervical, supra clavicular, axillary or inguinal lymph nodes. Resp: Lungs clear bilaterally. Cardio: Regular rate and rhythm. GI: Abdomen soft and nontender. No hepatomegaly. No mass. Vascular: No leg edema.   Lab Results:  11/03/2015: [name] 2.0  Medications: I have reviewed the patient's current medications.  Assessment/Plan: 1. 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 She began adjuvant CAPOX chemotherapy on 02/24/2012. She completed cycle 8 on 07/27/2012. Negative surveillance colonoscopy 12/08/2012 2. Delayed nausea following cycle 1 CAPOX. Aloxi was added beginning with cycle 2 with improvement. 3. Diabetes. 4. Microcytic anemia. Hemoglobin is normal. She will discontinue iron. 5. Remote history of a "colon tumor" removed endoscopically in 1980. 6. Left lower lung nodule noted on a CT 02/21/2012, slightly larger than on a CT 12/03/2011. Chest CT 06/12/2012 showed the previously seen 5 mm pulmonary nodule in left lower lobe was scarcely visualized, estimated at 3 mm. No other suspicious pulmonary nodules were identified. Chest CT 11/27/2012 showed interval enlargement of the left lower lobe pulmonary nodule with a  current measurement of 7 mm x 8 mm. No new pulmonary nodules. CT 02/22/2013 with enlargement of the anterior left lower lobe nodule and no other lung nodules. PET scan 03/02/2013 showed no associated hypermetabolism with the 12 mm left lower lobe pulmonary nodule. No hypermetabolic lymph nodes in the neck, chest, abdomen or pelvis. No abnormal hypermetabolic activity within the liver, pancreas, adrenal glands or spleen. Abnormal FDG accumulation in the posterior left nasopharyngeal mucosa.  Status post a wedge resection of the left lower lobe nodule on 03/27/2013 with the pathology confirming metastatic adenocarcinoma of colorectal primary.   Chest CT 07/18/2013-negative for recurrent disease.   Chest CT 09/19/2013 to evaluate for shortness of breath showed postoperative changes in the left lower lobe without evidence of recurrent or metastatic disease.  CT chest 04/01/2014 with postsurgical scarring in the left lower lobe. No evidence for metastatic disease in the chest. Stable appearance of mild subpleural reticulation of the lung bases.  CT chest/abdomen/pelvis 10/01/2014 with postoperative changes left lower lobe. No evidence of metastatic disease in the chest, abdomen or pelvis.   CT chest/abdomen/pelvis 11/03/2015 with no evidence of recurrent malignancy. 7. Status post Port-A-Cath placement 02/22/2012. Status post Port-A-Cath repositioning 03/14/2012. Port-A-Cath removed 06/04/2014 8. Probable acute laryngopharyngeal dysesthesia following cycle 2 CAPOX. Symptoms resolved with warming.  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 She has persistent neuropathy symptoms involving the feet. Improved with Neurontin 10. History of anterior chest pain-she reports undergoing an evaluation by Dr. Debara Pickett. 11. 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   Disposition: Peggy French remains in clinical/radiographic remission from colon cancer. She  will return for a follow-up visit  and [name] in 6 months. She will contact the office in the interim with any problems.  Patient seen with [name].  Ned Card ANP/GNP-BC   11/06/2015  11:55 AM This was a shared visit with Ned Card. Peggy French is in remission from colon cancer.  Julieanne Manson, M.D.

## 2015-11-06 NOTE — Progress Notes (Signed)
Routed patient labs, and CT from 11/03/15 to PCP [facility].

## 2015-12-22 ENCOUNTER — Encounter (INDEPENDENT_AMBULATORY_CARE_PROVIDER_SITE_OTHER): Payer: Self-pay | Admitting: *Deleted

## 2016-01-08 ENCOUNTER — Other Ambulatory Visit (INDEPENDENT_AMBULATORY_CARE_PROVIDER_SITE_OTHER): Payer: Self-pay | Admitting: *Deleted

## 2016-01-08 DIAGNOSIS — Z86010 Personal history of colonic polyps: Secondary | ICD-10-CM

## 2016-02-02 ENCOUNTER — Other Ambulatory Visit: Payer: Self-pay | Admitting: Nurse Practitioner

## 2016-02-02 DIAGNOSIS — I10 Essential (primary) hypertension: Secondary | ICD-10-CM

## 2016-02-02 DIAGNOSIS — C189 Malignant neoplasm of colon, unspecified: Secondary | ICD-10-CM

## 2016-02-02 DIAGNOSIS — C7800 Secondary malignant neoplasm of unspecified lung: Principal | ICD-10-CM

## 2016-02-06 ENCOUNTER — Encounter (INDEPENDENT_AMBULATORY_CARE_PROVIDER_SITE_OTHER): Payer: Self-pay | Admitting: *Deleted

## 2016-02-06 ENCOUNTER — Telehealth (INDEPENDENT_AMBULATORY_CARE_PROVIDER_SITE_OTHER): Payer: Self-pay | Admitting: *Deleted

## 2016-02-06 NOTE — Telephone Encounter (Signed)
Patient needs movi prep 

## 2016-02-10 MED ORDER — PEG-KCL-NACL-NASULF-NA ASC-C 100 G PO SOLR: 1.0000 | Freq: Once | ORAL | 0 refills | Status: AC

## 2016-02-23 ENCOUNTER — Telehealth (INDEPENDENT_AMBULATORY_CARE_PROVIDER_SITE_OTHER): Payer: Self-pay | Admitting: *Deleted

## 2016-02-23 NOTE — Telephone Encounter (Signed)
Referring MD/PCP: hill   Procedure: tcs  Reason/Indication:  Hx polyps  Has patient had this procedure before?  Yes, 2014 -- epic  If so, when, by whom and where?    Is there a family history of colon cancer?  no  Who?  What age when diagnosed?    Is patient diabetic?   yes      Does patient have prosthetic heart valve or mechanical valve?  no  Do you have a pacemaker?  no  Has patient ever had endocarditis? no  Has patient had joint replacement within last 12 months?  no  Does patient tend to be constipated or take laxatives? no  Does patient have a history of alcohol/drug use?  no  Is patient on Coumadin, Plavix and/or Aspirin? no  Medications: tylenol 500 mg prn, amlodipine 5 mg daily, lomotil 2.5/0.025 mg prn, ranitidine 150 mg daily, allegra 180 mg daily, gabapentin 100 mg nightly, tradjenta 5 mg daily, potassium 20 meq daily, propranolol/hctz 40/25 mg daily, ramipril 5 mg daily, simvastatin 40 mg daily  Allergies: asa  Medication Adjustment: hold tradjenta morning of procedure  Procedure date & time: 03/25/16 at 1200

## 2016-02-25 NOTE — Telephone Encounter (Signed)
agree

## 2016-03-25 ENCOUNTER — Encounter (HOSPITAL_COMMUNITY)
Admission: RE | Disposition: A | Discharge status: Home / Self Care | Payer: Self-pay | Source: Ambulatory Visit | Attending: Internal Medicine

## 2016-03-25 ENCOUNTER — Ambulatory Visit (HOSPITAL_COMMUNITY)
Admission: RE | Admit: 2016-03-25 | Discharge: 2016-03-25 | Disposition: A | Discharge status: Home / Self Care | Payer: Medicare Other | Source: Ambulatory Visit | Attending: Internal Medicine | Admitting: Internal Medicine

## 2016-03-25 ENCOUNTER — Encounter (HOSPITAL_COMMUNITY): Payer: Self-pay | Admitting: *Deleted

## 2016-03-25 DIAGNOSIS — M1990 Unspecified osteoarthritis, unspecified site: Secondary | ICD-10-CM | POA: Diagnosis not present

## 2016-03-25 DIAGNOSIS — E1140 Type 2 diabetes mellitus with diabetic neuropathy, unspecified: Secondary | ICD-10-CM | POA: Diagnosis not present

## 2016-03-25 DIAGNOSIS — I2510 Atherosclerotic heart disease of native coronary artery without angina pectoris: Secondary | ICD-10-CM | POA: Diagnosis not present

## 2016-03-25 DIAGNOSIS — E785 Hyperlipidemia, unspecified: Secondary | ICD-10-CM | POA: Insufficient documentation

## 2016-03-25 DIAGNOSIS — Z9049 Acquired absence of other specified parts of digestive tract: Secondary | ICD-10-CM | POA: Diagnosis not present

## 2016-03-25 DIAGNOSIS — Z85038 Personal history of other malignant neoplasm of large intestine: Secondary | ICD-10-CM | POA: Insufficient documentation

## 2016-03-25 DIAGNOSIS — Z85118 Personal history of other malignant neoplasm of bronchus and lung: Secondary | ICD-10-CM | POA: Diagnosis not present

## 2016-03-25 DIAGNOSIS — D123 Benign neoplasm of transverse colon: Secondary | ICD-10-CM | POA: Diagnosis not present

## 2016-03-25 DIAGNOSIS — Z7982 Long term (current) use of aspirin: Secondary | ICD-10-CM | POA: Insufficient documentation

## 2016-03-25 DIAGNOSIS — I10 Essential (primary) hypertension: Secondary | ICD-10-CM | POA: Insufficient documentation

## 2016-03-25 DIAGNOSIS — Z9221 Personal history of antineoplastic chemotherapy: Secondary | ICD-10-CM | POA: Diagnosis not present

## 2016-03-25 DIAGNOSIS — D128 Benign neoplasm of rectum: Secondary | ICD-10-CM | POA: Diagnosis not present

## 2016-03-25 DIAGNOSIS — Z980 Intestinal bypass and anastomosis status: Secondary | ICD-10-CM | POA: Diagnosis not present

## 2016-03-25 DIAGNOSIS — Z1211 Encounter for screening for malignant neoplasm of colon: Secondary | ICD-10-CM | POA: Insufficient documentation

## 2016-03-25 DIAGNOSIS — Z79899 Other long term (current) drug therapy: Secondary | ICD-10-CM | POA: Diagnosis not present

## 2016-03-25 DIAGNOSIS — K219 Gastro-esophageal reflux disease without esophagitis: Secondary | ICD-10-CM | POA: Insufficient documentation

## 2016-03-25 DIAGNOSIS — Z08 Encounter for follow-up examination after completed treatment for malignant neoplasm: Secondary | ICD-10-CM | POA: Diagnosis not present

## 2016-03-25 DIAGNOSIS — K621 Rectal polyp: Secondary | ICD-10-CM | POA: Diagnosis not present

## 2016-03-25 DIAGNOSIS — Z86010 Personal history of colonic polyps: Secondary | ICD-10-CM

## 2016-03-25 HISTORY — PX: COLONOSCOPY: SHX5424

## 2016-03-25 LAB — GLUCOSE, CAPILLARY: Glucose-Capillary: 126 mg/dL — ABNORMAL HIGH (ref 65–99)

## 2016-03-25 SURGERY — COLONOSCOPY
Anesthesia: Moderate Sedation

## 2016-03-25 MED ORDER — MEPERIDINE HCL 50 MG/ML IJ SOLN
INTRAMUSCULAR | Status: DC | PRN
  Administered 2016-03-25 (×2): 25 mg via INTRAVENOUS

## 2016-03-25 MED ORDER — SIMETHICONE 40 MG/0.6ML PO SUSP
ORAL | Status: DC | PRN
  Administered 2016-03-25: 12:00:00

## 2016-03-25 MED ORDER — MIDAZOLAM HCL 5 MG/5ML IJ SOLN
INTRAMUSCULAR | Status: AC
  Filled 2016-03-25: qty 10

## 2016-03-25 MED ORDER — MIDAZOLAM HCL 5 MG/5ML IJ SOLN
INTRAMUSCULAR | Status: DC | PRN
  Administered 2016-03-25 (×2): 2 mg via INTRAVENOUS
  Administered 2016-03-25: 1 mg via INTRAVENOUS

## 2016-03-25 MED ORDER — MEPERIDINE HCL 50 MG/ML IJ SOLN
INTRAMUSCULAR | Status: AC
  Filled 2016-03-25: qty 1

## 2016-03-25 NOTE — H&P (Signed)
Peggy French is an 75 y.o. female.   Chief Complaint: Patient is here for colonoscopy. HPI: Patient is 75 year old African-American female with history of colon carcinoma status post right hemicolectomy in June 2013. She had stage IIIc C disease. She received adjuvant chemotherapy. She had wedge resection for lung nodule in September 2014 not metastatic adenocarcinoma. She has remained in remission without recurrence of disease. [name] in April 2017 was 2. She sees [name] every 6 months. She has intermittent diarrhea for which she uses Lomotil on as-needed basis. She usually takes Excedrin she is going to be out of the house. She denies abdominal pain rectal bleeding or melena. She has no appetite and has not lost any weight. Last colonoscopy was about 3 years ago. Family history is negative for CRC.  Past Medical History:  Diagnosis Date  . Arthritis   . Asthmatic bronchitis 2011  . 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 s/p right hemicolectomy/chemo 2013 with mets to the lung s/p LLL wedge resection 2014.  . Diabetes mellitus    x over 10 yrs  . Family history of adverse reaction to anesthesia    FAMILY MEMBERS HAVE HAD N/V  . GERD (gastroesophageal reflux disease)   . Gout   . H/O hiatal hernia   . Headache(784.0)    hx migraines  . History of colon cancer   . History of shingles   . Hyperlipidemia   . Hypertension    EKG 10/12 EPIC, chest- 1 view  6/13 EPIC    Last 2D Echo on 05/02/2012 showed EF of greater than 55%  . Microcytic anemia   . Mild CAD    a. cath by Dr. Debara Pickett in 03/2011 - 20-30% ostial bifurcation stenosis of LAD, otherwise only mild luminal irregularities in LAD/RCA, LVEF 60%, no RWMA.  . Neuropathy ([place])   . Obesity     Past Surgical History:  Procedure Laterality Date  . ABDOMINAL HYSTERECTOMY    . APPENDECTOMY    . CARDIAC CATHETERIZATION  [phone]  . CATARACT EXTRACTION W/PHACO Left 02/17/2015   Procedure: CATARACT EXTRACTION PHACO  AND INTRAOCULAR LENS PLACEMENT (IOC);  Surgeon: Tonny Branch, MD;  Location: [facility];  Service: Ophthalmology;  Laterality: Left;  CDE:8.01   . CHOLECYSTECTOMY    . COLON SURGERY     PARTIAL COLECTOMY 30 YRS / AGAIN 2013  . COLONOSCOPY  11/26/2011   Procedure: COLONOSCOPY;  Surgeon: Rogene Houston, MD;  Location: [facility];  Service: Endoscopy;  Laterality: N/A;  2:15  . COLONOSCOPY N/A 12/08/2012   Procedure: COLONOSCOPY;  Surgeon: Rogene Houston, MD;  Location: [facility];  Service: Endoscopy;  Laterality: N/A;  815-moved to 0950 Ann to notify pt  . ESOPHAGOGASTRODUODENOSCOPY  06/16/2011   Procedure: ESOPHAGOGASTRODUODENOSCOPY (EGD);  Surgeon: Rogene Houston, MD;  Location: [facility];  Service: Endoscopy;  Laterality: N/A;  2:15  . PORT-A-CATH REMOVAL N/A 06/04/2014   Procedure: REMOVAL PORT-A-CATH;  Surgeon: Pedro Earls, MD;  Location: [facility];  Service: General;  Laterality: N/A;  . PORTACATH PLACEMENT  02/22/2012   Procedure: INSERTION PORT-A-CATH;  Surgeon: Pedro Earls, MD;  Location: [facility];  Service: General;  Laterality: N/A;  left subclavian  . VIDEO ASSISTED THORACOSCOPY (VATS)/WEDGE RESECTION Left 03/27/2013   Procedure: VIDEO ASSISTED THORACOSCOPY (VATS)/WEDGE RESECTION;  Surgeon: Grace Isaac, MD;  Location: [facility];  Service: Thoracic;  Laterality: Left;  Marland Kitchen VIDEO BRONCHOSCOPY N/A 03/27/2013   Procedure: VIDEO BRONCHOSCOPY;  Surgeon: Grace Isaac, MD;  Location: [facility] OR;  Service: Thoracic;  Laterality: N/A;    Family History  Problem Relation Age of Onset  . Cancer Maternal Aunt     throat,    Social History:  reports that she has never smoked. She has never used smokeless tobacco. She reports that she does not drink alcohol or use drugs.  Allergies:  Allergies  Allergen Reactions  . Aspirin Palpitations    Medications Prior to Admission  Medication Sig Dispense Refill  . acetaminophen (TYLENOL) 500 MG tablet Take 1,000 mg by mouth every 6 (six)  hours as needed for mild pain or moderate pain. For pain    . amLODipine (NORVASC) 5 MG tablet Take 5 mg by mouth every morning.     . carboxymethylcellulose (REFRESH PLUS) 0.5 % SOLN Place 2 drops into both eyes daily as needed (dry eyes).    . Cyanocobalamin (B-12 PO) Take 1 tablet by mouth daily.    Marland Kitchen esomeprazole (NEXIUM) 40 MG capsule Take 40 mg by mouth every evening.    . gabapentin (NEURONTIN) 100 MG capsule Take 100 mg by mouth at bedtime.     Marland Kitchen KLOR-CON M20 20 MEQ tablet TAKE 1 TABLET (20 MEQ TOTAL) BY MOUTH DAILY. 30 tablet 2  . linagliptin (TRADJENTA) 5 MG TABS tablet Take 5 mg by mouth daily after breakfast.     . propranolol-hydrochlorothiazide (INDERIDE) 40-25 MG per tablet Take 1 tablet by mouth daily with breakfast.    . ramipril (ALTACE) 5 MG capsule Take 5 mg by mouth daily with breakfast.     . simvastatin (ZOCOR) 40 MG tablet Take 40 mg by mouth at bedtime.     Marland Kitchen aspirin EC 81 MG tablet Take 1 tablet (81 mg total) by mouth daily.    . diphenoxylate-atropine (LOMOTIL) 2.5-0.025 MG tablet Take 1 tablet by mouth 4 (four) times daily as needed for diarrhea or loose stools.    . fexofenadine (ALLEGRA) 180 MG tablet Take 180 mg by mouth daily. For Allergies      Results for orders placed or performed during the hospital encounter of 03/25/16 (from the past 48 hour(s))  Glucose, capillary     Status: Abnormal   Collection Time: 03/25/16 10:51 AM  Result Value Ref Range   Glucose-Capillary 126 (H) 65 - 99 mg/dL   No results found.  ROS  Blood pressure (!) 149/78, pulse 85, temperature 97.9 F (36.6 C), temperature source Oral, resp. rate 16, height '5\' 5"'$  (1.651 m), weight 210 lb (95.3 kg), SpO2 98 %. Physical Exam  Constitutional: She is oriented to person, place, and time. She appears well-developed and well-nourished.  HENT:  Mouth/Throat: Oropharynx is clear and moist.  Eyes: Conjunctivae are normal. No scleral icterus.  Neck: No thyromegaly present.  Cardiovascular:  Normal rate, regular rhythm and normal heart sounds.   No murmur heard. Respiratory: Effort normal and breath sounds normal.  GI:  Abdomen is full but soft with horizontal scar across right mid abdomen. No tenderness in a megaly or masses.  Musculoskeletal: She exhibits no edema.  Lymphadenopathy:    She has no cervical adenopathy.  Neurological: She is alert and oriented to person, place, and time.  Skin: Skin is warm and dry.     Assessment/Plan History of colon carcinoma. Surveillance colonoscopy.  Hildred Laser, MD 03/25/2016, 11:29 AM

## 2016-03-25 NOTE — Discharge Instructions (Signed)
Resume usual medications including aspirin at usual dose. Resume usual diet. No driving for 24 hours. Physician will call with biopsy results. Next colonoscopy in 5 years.   Colonoscopy, Care After These instructions give you information on caring for yourself after your procedure. Your doctor may also give you more specific instructions. Call your doctor if you have any problems or questions after your procedure. HOME CARE  Do not drive for 24 hours.  Do not sign important papers or use machinery for 24 hours.  You may shower.  You may go back to your usual activities, but go slower for the first 24 hours.  Take rest breaks often during the first 24 hours.  Walk around or use warm packs on your belly (abdomen) if you have belly cramping or gas.  Drink enough fluids to keep your pee (urine) clear or pale yellow.  Resume your normal diet. Avoid heavy or fried foods.  Avoid drinking alcohol for 24 hours or as told by your doctor.  Only take medicines as told by your doctor. If a tissue sample (biopsy) was taken during the procedure:   Do not take aspirin or blood thinners for 7 days, or as told by your doctor.  Do not drink alcohol for 7 days, or as told by your doctor.  Eat soft foods for the first 24 hours. GET HELP IF: You still have a small amount of blood in your poop (stool) 2-3 days after the procedure. GET HELP RIGHT AWAY IF:  You have more than a small amount of blood in your poop.  You see clumps of tissue (blood clots) in your poop.  Your belly is puffy (swollen).  You feel sick to your stomach (nauseous) or throw up (vomit).  You have a fever.  You have belly pain that gets worse and medicine does not help. MAKE SURE YOU:  Understand these instructions.  Will watch your condition.  Will get help right away if you are not doing well or get worse.   This information is not intended to replace advice given to you by your health care provider. Make  sure you discuss any questions you have with your health care provider.   Document Released: 07/31/2010 Document Revised: 07/03/2013 Document Reviewed: 03/05/2013 Elsevier Interactive Patient Education [employer].    Diverticulosis Diverticulosis is the condition that develops when small pouches (diverticula) form in the wall of your colon. Your colon, or large intestine, is where water is absorbed and stool is formed. The pouches form when the inside layer of your colon pushes through weak spots in the outer layers of your colon. CAUSES  No one knows exactly what causes diverticulosis. RISK FACTORS  Being older than 59. Your risk for this condition increases with age. Diverticulosis is rare in people younger than 40 years. By age 89, almost everyone has it.  Eating a low-fiber diet.  Being frequently constipated.  Being overweight.  Not getting enough exercise.  Smoking.  Taking over-the-counter pain medicines, like aspirin and ibuprofen. SYMPTOMS  Most people with diverticulosis do not have symptoms. DIAGNOSIS  Because diverticulosis often has no symptoms, health care providers often discover the condition during an exam for other colon problems. In many cases, a health care provider will diagnose diverticulosis while using a flexible scope to examine the colon (colonoscopy). TREATMENT  If you have never developed an infection related to diverticulosis, you may not need treatment. If you have had an infection before, treatment may include:  Eating more  fruits, vegetables, and grains.  Taking a fiber supplement.  Taking a live bacteria supplement (probiotic).  Taking medicine to relax your colon. HOME CARE INSTRUCTIONS   Drink at least 6-8 glasses of water each day to prevent constipation.  Try not to strain when you have a bowel movement.  Keep all follow-up appointments. If you have had an infection before:  Increase the fiber in your diet as directed by  your health care provider or dietitian.  Take a dietary fiber supplement if your health care provider approves.  Only take medicines as directed by your health care provider. SEEK MEDICAL CARE IF:   You have abdominal pain.  You have bloating.  You have cramps.  You have not gone to the bathroom in 3 days. SEEK IMMEDIATE MEDICAL CARE IF:   Your pain gets worse.  Yourbloating becomes very bad.  You have a fever or chills, and your symptoms suddenly get worse.  You begin vomiting.  You have bowel movements that are bloody or black. MAKE SURE YOU:  Understand these instructions.  Will watch your condition.  Will get help right away if you are not doing well or get worse.   This information is not intended to replace advice given to you by your health care provider. Make sure you discuss any questions you have with your health care provider.   Document Released: 03/25/2004 Document Revised: 07/03/2013 Document Reviewed: 05/23/2013 Elsevier Interactive Patient Education 2016 Elsevier Inc.  Colon Polyps Polyps are lumps of extra tissue growing inside the body. Polyps can grow in the large intestine (colon). Most colon polyps are noncancerous (benign). However, some colon polyps can become cancerous over time. Polyps that are larger than a pea may be harmful. To be safe, caregivers remove and test all polyps. CAUSES  Polyps form when mutations in the genes cause your cells to grow and divide even though no more tissue is needed. RISK FACTORS There are a number of risk factors that can increase your chances of getting colon polyps. They include:  Being older than 50 years.  Family history of colon polyps or colon cancer.  Long-term colon diseases, such as colitis or Crohn disease.  Being overweight.  Smoking.  Being inactive.  Drinking too much alcohol. SYMPTOMS  Most small polyps do not cause symptoms. If symptoms are present, they may include:  Blood in the  stool. The stool may look dark red or black.  Constipation or diarrhea that lasts longer than 1 week. DIAGNOSIS People often do not know they have polyps until their caregiver finds them during a regular checkup. Your caregiver can use 4 tests to check for polyps:  Digital rectal exam. The caregiver wears gloves and feels inside the rectum. This test would find polyps only in the rectum.  Barium enema. The caregiver puts a liquid called barium into your rectum before taking X-rays of your colon. Barium makes your colon look white. Polyps are dark, so they are easy to see in the X-ray pictures.  Sigmoidoscopy. A thin, flexible tube (sigmoidoscope) is placed into your rectum. The sigmoidoscope has a light and tiny camera in it. The caregiver uses the sigmoidoscope to look at the last third of your colon.  Colonoscopy. This test is like sigmoidoscopy, but the caregiver looks at the entire colon. This is the most common method for finding and removing polyps. TREATMENT  Any polyps will be removed during a sigmoidoscopy or colonoscopy. The polyps are then tested for cancer. PREVENTION  To help  lower your risk of getting more colon polyps:  Eat plenty of fruits and vegetables. Avoid eating fatty foods.  Do not smoke.  Avoid drinking alcohol.  Exercise every day.  Lose weight if recommended by your caregiver.  Eat plenty of calcium and folate. Foods that are rich in calcium include milk, cheese, and broccoli. Foods that are rich in folate include chickpeas, kidney beans, and spinach. HOME CARE INSTRUCTIONS Keep all follow-up appointments as directed by your caregiver. You may need periodic exams to check for polyps. SEEK MEDICAL CARE IF: You notice bleeding during a bowel movement.   This information is not intended to replace advice given to you by your health care provider. Make sure you discuss any questions you have with your health care provider.   Document Released: 03/24/2004  Document Revised: 07/19/2014 Document Reviewed: 09/07/2011 Elsevier Interactive Patient Education [employer].

## 2016-03-25 NOTE — Op Note (Addendum)
[facility] Patient Name: Peggy French Procedure Date: 03/25/2016 11:26 AM MRN: [id number] Date of Birth: 1941-05-23 Attending MD: Hildred Laser , MD CSN: [id number] Age: 75 Admit Type: Outpatient Procedure:                Colonoscopy Indications:              High risk colon cancer surveillance: Personal                            history of colon cancer Providers:                Hildred Laser, MD, Otis Peak B. Sharon Seller, RN, Bonnetta Barry, Technician Referring MD:             Barrie Folk. Hill MD, Gilbert Benay Spice, MD Medicines:                Meperidine 50 mg IV, Midazolam 5 mg IV Complications:            No immediate complications. Estimated Blood Loss:     Estimated blood loss was minimal. Procedure:                Pre-Anesthesia Assessment:                           - Prior to the procedure, a History and Physical                            was performed, and patient medications and                            allergies were reviewed. The patient's tolerance of                            previous anesthesia was also reviewed. The risks                            and benefits of the procedure and the sedation                            options and risks were discussed with the patient.                            All questions were answered, and informed consent                            was obtained. Prior Anticoagulants: The patient                            last took aspirin 14 days prior to the procedure.                            ASA Grade Assessment: III - A patient with severe  systemic disease. After reviewing the risks and                            benefits, the patient was deemed in satisfactory                            condition to undergo the procedure.                           After obtaining informed consent, the colonoscope                            was passed under direct vision. Throughout the                   procedure, the patient's blood pressure, pulse, and                            oxygen saturations were monitored continuously. The                            was introduced through the anus and advanced to the                            the ileocolonic anastomosis. The colonoscopy was                            performed without difficulty. The patient tolerated                            the procedure well. The quality of the bowel                            preparation was excellent. The terminal ileum and                            the rectum were photographed. Scope In: 11:42:00 AM Scope Out: 12:01:37 PM Total Procedure Duration: 0 hours 19 minutes 37 seconds  Findings:      The neo-terminal ileum appeared normal.      There was evidence of a prior end-to-side colo-colonic anastomosis at       the hepatic flexure. This was patent and was characterized by healthy       appearing mucosa. The anastomosis was traversed.      Two sessile polyps were found in the rectum and transverse colon. The       polyps were small in size. These polyps were removed with a cold snare.       Resection and retrieval were complete. The pathology specimen was placed       into Bottle Number 1.      The retroflexed view of the distal rectum and anal verge was normal and       showed no anal or rectal abnormalities. Impression:               - The examined portion of the ileum was normal.                           -  Patent end-to-side colo-colonic anastomosis,                            characterized by healthy appearing mucosa.                           - Two small polyps in the rectum and in the                            transverse colon, removed with a cold snare.                            Resected and retrieved.                           - The distal rectum and anal verge are normal on                            retroflexion view. Moderate Sedation:      Moderate (conscious) sedation  was administered by the endoscopy nurse       and supervised by the endoscopist. The following parameters were       monitored: oxygen saturation, heart rate, blood pressure, CO2       capnography and response to care. Total physician intraservice time was       24 minutes. Recommendation:           - Patient has a contact number available for                            emergencies. The signs and symptoms of potential                            delayed complications were discussed with the                            patient. Return to normal activities tomorrow.                            Written discharge instructions were provided to the                            patient.                           - Resume previous diet today.                           - Continue present medications.                           - Resume aspirin at prior dose tomorrow.                           - Await pathology results.                           -  Repeat colonoscopy in 5 years for surveillance. Procedure Code(s):        --- Professional ---                           [phone], Colonoscopy, flexible; with removal of                            tumor(s), polyp(s), or other lesion(s) by snare                            technique                           99152, Moderate sedation services provided by the                            same physician or other qualified health care                            professional performing the diagnostic or                            therapeutic service that the sedation supports,                            requiring the presence of an independent trained                            observer to assist in the monitoring of the                            patient's level of consciousness and physiological                            status; initial 15 minutes of intraservice time,                            patient age 64 years or older                           [phone], Moderate  sedation services; each additional                            15 minutes intraservice time Diagnosis Code(s):        --- Professional ---                           [phone], Personal history of other malignant                            neoplasm of large intestine                           Z98.0, Intestinal bypass and anastomosis status  K62.1, Rectal polyp                           D12.3, Benign neoplasm of transverse colon (hepatic                            flexure or splenic flexure) CPT copyright 2016 American Medical Association. All rights reserved. The codes documented in this report are preliminary and upon coder review may  be revised to meet current compliance requirements. Hildred Laser, MD Hildred Laser, MD 03/25/2016 12:12:00 PM This report has been signed electronically. Number of Addenda: 0

## 2016-03-27 ENCOUNTER — Emergency Department (HOSPITAL_COMMUNITY)
Admission: EM | Admit: 2016-03-27 | Discharge: 2016-03-28 | Disposition: A | Discharge status: Home / Self Care | Payer: Medicare Other | Attending: Emergency Medicine | Admitting: Emergency Medicine

## 2016-03-27 ENCOUNTER — Encounter (HOSPITAL_COMMUNITY): Payer: Self-pay

## 2016-03-27 ENCOUNTER — Emergency Department (HOSPITAL_COMMUNITY): Payer: Medicare Other

## 2016-03-27 ENCOUNTER — Other Ambulatory Visit: Payer: Self-pay

## 2016-03-27 DIAGNOSIS — Z7982 Long term (current) use of aspirin: Secondary | ICD-10-CM | POA: Diagnosis not present

## 2016-03-27 DIAGNOSIS — Z85038 Personal history of other malignant neoplasm of large intestine: Secondary | ICD-10-CM | POA: Diagnosis not present

## 2016-03-27 DIAGNOSIS — Z85118 Personal history of other malignant neoplasm of bronchus and lung: Secondary | ICD-10-CM | POA: Diagnosis not present

## 2016-03-27 DIAGNOSIS — I10 Essential (primary) hypertension: Secondary | ICD-10-CM | POA: Diagnosis not present

## 2016-03-27 DIAGNOSIS — Z791 Long term (current) use of non-steroidal anti-inflammatories (NSAID): Secondary | ICD-10-CM | POA: Diagnosis not present

## 2016-03-27 DIAGNOSIS — E119 Type 2 diabetes mellitus without complications: Secondary | ICD-10-CM | POA: Insufficient documentation

## 2016-03-27 DIAGNOSIS — J45909 Unspecified asthma, uncomplicated: Secondary | ICD-10-CM | POA: Insufficient documentation

## 2016-03-27 DIAGNOSIS — R079 Chest pain, unspecified: Secondary | ICD-10-CM | POA: Diagnosis present

## 2016-03-27 DIAGNOSIS — Z79899 Other long term (current) drug therapy: Secondary | ICD-10-CM | POA: Diagnosis not present

## 2016-03-27 LAB — BASIC METABOLIC PANEL
Anion gap: 10 (ref 5–15)
BUN: 13 mg/dL (ref 6–20)
CHLORIDE: 101 mmol/L (ref 101–111)
CO2: 28 mmol/L (ref 22–32)
Calcium: 8.5 mg/dL — ABNORMAL LOW (ref 8.9–10.3)
Creatinine, Ser: 1.02 mg/dL — ABNORMAL HIGH (ref 0.44–1.00)
GFR calc Af Amer: >60 mL/min (ref >60)
GFR calc non Af Amer: 53 mL/min — ABNORMAL LOW (ref >60)
GLUCOSE: 125 mg/dL — AB (ref 65–99)
POTASSIUM: 3.2 mmol/L — AB (ref 3.5–5.1)
Sodium: 139 mmol/L (ref 135–145)

## 2016-03-27 LAB — CBC
HEMATOCRIT: 38.3 % (ref 36.0–46.0)
HEMOGLOBIN: 12.7 g/dL (ref 12.0–15.0)
MCH: 26.9 pg (ref 26.0–34.0)
MCHC: 33.2 g/dL (ref 30.0–36.0)
MCV: 81.1 fL (ref 78.0–100.0)
Platelets: 252 10*3/uL (ref 150–400)
RBC: 4.72 MIL/uL (ref 3.87–5.11)
RDW: 13.6 % (ref 11.5–15.5)
WBC: 6.3 10*3/uL (ref 4.0–10.5)

## 2016-03-27 LAB — I-STAT TROPONIN, ED
Troponin i, poc: 0 ng/mL (ref 0.00–0.08)
Troponin i, poc: 0 ng/mL (ref 0.00–0.08)

## 2016-03-27 NOTE — ED Provider Notes (Signed)
[facility] DEPT Provider Note   CSN: [id number] Arrival date & time: 03/27/16  2055  By signing my name below, I, Judithe Modest, attest that this documentation has been prepared under the direction and in the presence of Judge Stall, MD. Electronically Signed: Judithe Modest, ER Scribe. 02/21/2016. 10:33 PM.  History   Chief Complaint Chief Complaint  Patient presents with  . Chest Pain   HPI  HPI Comments: Peggy French is a 75 y.o. female who presents to the Emergency Department complaining of intermittent sharp central CP that radiates to her back, left breast and left shoulder for the last four hours. She also states her BP has been low for her today which is 106/61 all day. She states she had CP like this in December, 2015. She reported to the ED and received a normal EKG, echocardiogram with normal ejection fraction on December 4th, 2015, and normal CT angiogram on December 5th, 2015.   Past Medical History:  Diagnosis Date  . Arthritis   . Asthmatic bronchitis 2011  . 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 s/p right hemicolectomy/chemo 2013 with mets to the lung s/p LLL wedge resection 2014.  . Diabetes mellitus    x over 10 yrs  . Family history of adverse reaction to anesthesia    FAMILY MEMBERS HAVE HAD N/V  . GERD (gastroesophageal reflux disease)   . Gout   . H/O hiatal hernia   . Headache(784.0)    hx migraines  . History of colon cancer   . History of shingles   . Hyperlipidemia   . Hypertension    EKG 10/12 EPIC, chest- 1 view  6/13 EPIC    Last 2D Echo on 05/02/2012 showed EF of greater than 55%  . Microcytic anemia   . Mild CAD    a. cath by Dr. Debara Pickett in 03/2011 - 20-30% ostial bifurcation stenosis of LAD, otherwise only mild luminal irregularities in LAD/RCA, LVEF 60%, no RWMA.  . Neuropathy ([place])   . Obesity     Patient Active Problem List   Diagnosis Date Noted  . Chest pain 06/17/2015  . Diabetes mellitus type 2 in  obese ([place]) 06/17/2015  . Obesity   . 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 S/P right colectomy 01/07/2012  . 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 Gout   . Diabetes mellitus   . Hypertension   . Hyperlipidemia     Past Surgical History:  Procedure Laterality Date  . ABDOMINAL HYSTERECTOMY    . APPENDECTOMY    . CARDIAC CATHETERIZATION  [phone]  . CATARACT EXTRACTION W/PHACO Left 02/17/2015   Procedure: CATARACT EXTRACTION PHACO AND INTRAOCULAR LENS PLACEMENT (IOC);  Surgeon: Tonny Branch, MD;  Location: [facility];  Service: Ophthalmology;  Laterality: Left;  CDE:8.01   . CHOLECYSTECTOMY    . COLON SURGERY     PARTIAL COLECTOMY 30 YRS / AGAIN 2013  . COLONOSCOPY  11/26/2011   Procedure: COLONOSCOPY;  Surgeon: Rogene Houston, MD;  Location: [facility];  Service: Endoscopy;  Laterality: N/A;  2:15  . COLONOSCOPY N/A 12/08/2012   Procedure: COLONOSCOPY;  Surgeon: Rogene Houston, MD;  Location: [facility];  Service: Endoscopy;  Laterality: N/A;  815-moved to 0950 Ann to notify pt  . ESOPHAGOGASTRODUODENOSCOPY  06/16/2011   Procedure: ESOPHAGOGASTRODUODENOSCOPY (EGD);  Surgeon: Rogene Houston, MD;  Location: [facility];  Service: Endoscopy;  Laterality: N/A;  2:15  . PORT-A-CATH  REMOVAL N/A 06/04/2014   Procedure: REMOVAL PORT-A-CATH;  Surgeon: Pedro Earls, MD;  Location: [facility];  Service: General;  Laterality: N/A;  . PORTACATH PLACEMENT  02/22/2012   Procedure: INSERTION PORT-A-CATH;  Surgeon: Pedro Earls, MD;  Location: [facility];  Service: General;  Laterality: N/A;  left subclavian  . VIDEO ASSISTED THORACOSCOPY (VATS)/WEDGE RESECTION Left 03/27/2013   Procedure: VIDEO ASSISTED THORACOSCOPY (VATS)/WEDGE RESECTION;  Surgeon: Grace Isaac, MD;  Location: [place];  Service: Thoracic;  Laterality: Left;  Marland Kitchen VIDEO BRONCHOSCOPY N/A 03/27/2013   Procedure: VIDEO BRONCHOSCOPY;  Surgeon: Grace Isaac, MD;  Location: [facility] OR;  Service: Thoracic;   Laterality: N/A;    OB History    No data available       Home Medications    Prior to Admission medications   Medication Sig Start Date End Date Taking? Authorizing Provider  acetaminophen (TYLENOL) 500 MG tablet Take 1,000 mg by mouth every 6 (six) hours as needed for mild pain or moderate pain. For pain   Yes Historical Provider, MD  amLODipine (NORVASC) 5 MG tablet Take 5 mg by mouth every morning.    Yes Historical Provider, MD  aspirin EC 81 MG tablet Take 1 tablet (81 mg total) by mouth daily. 06/17/15  Yes Shanker Kristeen Mans, MD  carboxymethylcellulose (REFRESH PLUS) 0.5 % SOLN Place 2 drops into both eyes daily as needed (dry eyes).   Yes Historical Provider, MD  Cyanocobalamin (B-12 PO) Take 1 tablet by mouth daily.   Yes Historical Provider, MD  diphenoxylate-atropine (LOMOTIL) 2.5-0.025 MG tablet Take 1 tablet by mouth 4 (four) times daily as needed for diarrhea or loose stools.   Yes Historical Provider, MD  esomeprazole (NEXIUM) 40 MG capsule Take 40 mg by mouth every evening. 07/16/11  Yes Rogene Houston, MD  fexofenadine (ALLEGRA) 180 MG tablet Take 180 mg by mouth daily. For Allergies   Yes Historical Provider, MD  gabapentin (NEURONTIN) 100 MG capsule Take 100 mg by mouth at bedtime.    Yes Historical Provider, MD  KLOR-CON M20 20 MEQ tablet TAKE 1 TABLET (20 MEQ TOTAL) BY MOUTH DAILY. 02/03/16  Yes Owens Shark, NP  linagliptin (TRADJENTA) 5 MG TABS tablet Take 5 mg by mouth daily after breakfast.    Yes Historical Provider, MD  propranolol-hydrochlorothiazide (INDERIDE) 40-25 MG per tablet Take 1 tablet by mouth daily with breakfast. 04/06/13  Yes Donielle Liston Alba, PA-C  ramipril (ALTACE) 5 MG capsule Take 5 mg by mouth daily with breakfast.    Yes Historical Provider, MD  simvastatin (ZOCOR) 40 MG tablet Take 40 mg by mouth at bedtime.    Yes Historical Provider, MD  vitamin C (ASCORBIC ACID) 500 MG tablet Take 500 mg by mouth daily.   Yes Historical Provider, MD     Family History Family History  Problem Relation Age of Onset  . Cancer Maternal Aunt     throat,     Social History Social History  Substance Use Topics  . Smoking status: Never Smoker  . Smokeless tobacco: Never Used  . Alcohol use No     Allergies   Aspirin   Review of Systems Review of Systems  Constitutional: Negative for chills and fever.  Respiratory: Negative for chest tightness and shortness of breath.   Cardiovascular: Positive for chest pain. Negative for palpitations.  Neurological: Negative for headaches.     Physical Exam Updated Vital Signs BP 130/60   Pulse 75   Temp 97.8  F (36.6 C) (Oral)   Resp 16   Ht '5\' 5"'$  (1.651 m)   Wt 210 lb (95.3 kg)   SpO2 99%   BMI 34.95 kg/m   Physical Exam  Constitutional: She is oriented to person, place, and time. She appears well-developed and well-nourished. No distress.  HENT:  Head: Normocephalic and atraumatic.  Eyes: Pupils are equal, round, and reactive to light.  Neck: Neck supple.  Cardiovascular: Normal rate.   Pulmonary/Chest: Effort normal. No respiratory distress.  Musculoskeletal: Normal range of motion.  Neurological: She is alert and oriented to person, place, and time. Coordination normal.  Skin: Skin is warm and dry. She is not diaphoretic.  Psychiatric: She has a normal mood and affect. Her behavior is normal.  Nursing note and vitals reviewed.    ED Treatments / Results  DIAGNOSTIC STUDIES: Oxygen Saturation is 98% on RA, normal by my interpretation.    COORDINATION OF CARE: 10:34 PM Discussed treatment plan with pt at bedside which includes troponin, and EKG and pt agreed to plan.  Labs (all labs ordered are listed, but only abnormal results are displayed) Labs Reviewed  BASIC METABOLIC PANEL - Abnormal; Notable for the following:       Result Value   Potassium 3.2 (*)    Glucose, Bld 125 (*)    Creatinine, Ser 1.02 (*)    Calcium 8.5 (*)    GFR calc non Af Amer 53 (*)     All other components within normal limits  CBC  I-STAT TROPOININ, ED  I-STAT TROPOININ, ED    ED ECG REPORT  I personally interpreted this EKG   Date: 03/28/2016   Rate: 82   Rhythm: normal sinus rhythm and premature atrial contractions (PAC)  QRS Axis: normal  Intervals: normal  ST/T Wave abnormalities: nonspecific ST changes and nonspecific T wave changes  Conduction Disutrbances:none  Narrative Interpretation: LVH with repol abnormality  Old EKG Reviewed: unchanged  Radiology Dg Chest 2 View  Result Date: 03/27/2016 CLINICAL DATA:  Central chest pain that began prior to presentation, low blood pressure, weakness, nausea and back pain, history of asthma, diabetes mellitus, hypertension, coronary artery disease, colon cancer metastatic to lungs EXAM: CHEST  2 VIEW COMPARISON:  Chest radiograph 06/16/2015, CT chest 11/03/2015 FINDINGS: Normal heart size, mediastinal contours, and pulmonary vascularity. Chronic bronchitic changes. No acute infiltrate, pleural effusion or pneumothorax. Postsurgical changes at LEFT base. Diffuse osseous demineralization. IMPRESSION: Chronic bronchitic changes and LEFT basilar scarring. No acute abnormalities. Electronically Signed   By: Lavonia Dana M.D.   On: 03/27/2016 22:24    Procedures Procedures (including critical care time)  Medications Ordered in ED Medications - No data to display   Initial Impression / Assessment and Plan / ED Course  I have reviewed the triage vital signs and the nursing notes.  Pertinent labs & imaging results that were available during my care of the patient were reviewed by me and considered in my medical decision making (see chart for details).  Clinical Course    Second troponin neg Xray neg, no acute findings ECG without acute fdinings either.  Final Clinical Impressions(s) / ED Diagnoses   Final diagnoses:  Chest pain, unspecified chest pain type    New Prescriptions New Prescriptions   No  medications on file     I personally performed the services described in this documentation, which was scribed in my presence. The recorded information has been reviewed and is accurate.  Noemi Chapel, MD 03/28/16 [phone]

## 2016-03-27 NOTE — ED Triage Notes (Signed)
Patient c/o central chest pain that started PTA, low blood pressure, weakness, nausea, and back pain

## 2016-03-28 NOTE — Discharge Instructions (Signed)

## 2016-03-31 ENCOUNTER — Encounter (HOSPITAL_COMMUNITY): Payer: Self-pay | Admitting: Internal Medicine

## 2016-04-06 ENCOUNTER — Telehealth: Payer: Self-pay

## 2016-05-07 ENCOUNTER — Ambulatory Visit: Payer: Medicare Other | Admitting: Oncology

## 2016-05-07 ENCOUNTER — Other Ambulatory Visit: Payer: Medicare Other

## 2016-05-12 ENCOUNTER — Other Ambulatory Visit: Payer: Self-pay | Admitting: Nurse Practitioner

## 2016-05-12 DIAGNOSIS — C189 Malignant neoplasm of colon, unspecified: Secondary | ICD-10-CM

## 2016-05-12 DIAGNOSIS — I10 Essential (primary) hypertension: Secondary | ICD-10-CM

## 2016-05-12 DIAGNOSIS — C7800 Secondary malignant neoplasm of unspecified lung: Principal | ICD-10-CM

## 2016-05-26 ENCOUNTER — Telehealth: Payer: Self-pay | Admitting: Cardiology

## 2016-05-26 NOTE — Telephone Encounter (Signed)
Records received from [facility] for apt on 05/27/16 with [name] PA. Records given to [employer] (medical records) CN

## 2016-05-27 ENCOUNTER — Ambulatory Visit (INDEPENDENT_AMBULATORY_CARE_PROVIDER_SITE_OTHER): Payer: Medicare Other | Admitting: Cardiology

## 2016-05-27 ENCOUNTER — Encounter: Payer: Self-pay | Admitting: Cardiology

## 2016-05-27 VITALS — BP 128/80 | HR 70 | Ht 64.0 in | Wt 216.2 lb

## 2016-05-27 DIAGNOSIS — R079 Chest pain, unspecified: Secondary | ICD-10-CM | POA: Diagnosis not present

## 2016-05-27 DIAGNOSIS — I10 Essential (primary) hypertension: Secondary | ICD-10-CM

## 2016-05-27 DIAGNOSIS — E669 Obesity, unspecified: Secondary | ICD-10-CM

## 2016-05-27 DIAGNOSIS — I2510 Atherosclerotic heart disease of native coronary artery without angina pectoris: Secondary | ICD-10-CM | POA: Diagnosis not present

## 2016-05-27 DIAGNOSIS — E1169 Type 2 diabetes mellitus with other specified complication: Secondary | ICD-10-CM

## 2016-05-27 MED ORDER — NITROGLYCERIN 0.4 MG SL SUBL: 0.4000 mg | SUBLINGUAL_TABLET | SUBLINGUAL | 3 refills | Status: DC | PRN

## 2016-05-27 NOTE — Assessment & Plan Note (Signed)
20-30% LAD at cath in 2001 (Dr Melvern Banker) and 2012 (Dr Debara Pickett)- no other significant CAD. Myoview low risk Dec 2016

## 2016-05-27 NOTE — Assessment & Plan Note (Signed)
On statin Rx followed by Dr Berdine Addison

## 2016-05-27 NOTE — Progress Notes (Signed)
05/27/2016 Peggy French   Nov 17, 1940  [id number]  Primary Physician Maggie Font, MD Primary Cardiologist: Dr Debara Pickett  HPI:  Pleasant 75 y/o obese AA female with a history of minor CAD by cath in 2001 and 2012. She has had chest pain and was seen in the ED in Sept 2016 and Myoview, chest CTA, and echo in Dec 2016 were unremarkable then. She was seen again in the ED Sept 2017 and ruled out for an MI. The pt says she had chest pain over the weekend. She did not go to the hospital.  She saw Dr Berdine Addison yesterday and he reassured her her EKG looked OK but wanted her seen here. The pt describes mid sternal "heaviness and tightness" that started Friday PM and lasted all weekend. Friday she had associated nausea, vomiting, and "sweating". Her pain would come and go, not obviously worsened with activity. She did not take NTG. She is on daily Nexium and is s/p remote cholecystectomy. Other medical problems include HTN, HLD, NIDDM, obesity, GERD, colon cancer s/p colectomy, and lung cancer s/p wedge resection.    Current Outpatient Prescriptions  Medication Sig Dispense Refill  . acetaminophen (TYLENOL) 500 MG tablet Take 1,000 mg by mouth every 6 (six) hours as needed for mild pain or moderate pain. For pain    . allopurinol (ZYLOPRIM) 100 MG tablet Take 100 mg by mouth daily.  1  . amLODipine (NORVASC) 5 MG tablet Take 5 mg by mouth every morning.     Marland Kitchen aspirin EC 81 MG tablet Take 1 tablet (81 mg total) by mouth daily.    . carboxymethylcellulose (REFRESH PLUS) 0.5 % SOLN Place 2 drops into both eyes daily as needed (dry eyes).    . Cyanocobalamin (B-12 PO) Take 1 tablet by mouth daily.    . diphenoxylate-atropine (LOMOTIL) 2.5-0.025 MG tablet Take 1 tablet by mouth 4 (four) times daily as needed for diarrhea or loose stools.    Marland Kitchen esomeprazole (NEXIUM) 40 MG capsule Take 40 mg by mouth every evening.    . fexofenadine (ALLEGRA) 180 MG tablet Take 180 mg by mouth daily. For Allergies    .  gabapentin (NEURONTIN) 100 MG capsule Take 100 mg by mouth at bedtime.     Marland Kitchen KLOR-CON M20 20 MEQ tablet TAKE 1 TABLET (20 MEQ TOTAL) BY MOUTH DAILY. 30 tablet 2  . linagliptin (TRADJENTA) 5 MG TABS tablet Take 5 mg by mouth daily after breakfast.     . propranolol-hydrochlorothiazide (INDERIDE) 40-25 MG per tablet Take 1 tablet by mouth daily with breakfast.    . ramipril (ALTACE) 5 MG capsule Take 5 mg by mouth daily with breakfast.     . simvastatin (ZOCOR) 40 MG tablet Take 40 mg by mouth at bedtime.     . vitamin C (ASCORBIC ACID) 500 MG tablet Take 500 mg by mouth daily.    . nitroGLYCERIN (NITROSTAT) 0.4 MG SL tablet Place 1 tablet (0.4 mg total) under the tongue every 5 (five) minutes as needed for chest pain. 25 tablet 3   No current facility-administered medications for this visit.    Facility-Administered Medications Ordered in Other Visits  Medication Dose Route Frequency Provider Last Rate Last Dose  . sodium chloride 0.9 % injection 10 mL  10 mL Intracatheter PRN Ladell Pier, MD        Allergies  Allergen Reactions  . Aspirin Palpitations    Social History   Social History  . Marital status: Married  Spouse name: N/A  . Number of children: 4  . Years of education: N/A   Occupational History  .  Retired   Social History Main Topics  . Smoking status: Never Smoker  . Smokeless tobacco: Never Used  . Alcohol use No  . Drug use: No  . Sexual activity: Not on file   Other Topics Concern  . Not on file   Social History Narrative   Married to husband, Herbie [place]   Retired Engineer, manufacturing systems     Review of Systems: General: negative for chills, fever, night sweats or weight changes.  Cardiovascular: negative for dyspnea on exertion, edema, orthopnea, palpitations, paroxysmal nocturnal dyspnea or shortness of breath Dermatological: negative for rash Respiratory: negative for cough or wheezing Urologic: negative for hematuria Abdominal: negative for diarrhea,  bright red blood per rectum, melena, or hematemesis Neurologic: negative for visual changes, syncope, or dizziness All other systems reviewed and are otherwise negative except as noted above.    Blood pressure 128/80, pulse 70, height '5\' 4"'$  (1.626 m), weight 216 lb 3.2 oz (98.1 kg).  General appearance: alert, cooperative, no distress and morbidly obese Neck: no JVD Lungs: clear to auscultation bilaterally Heart: regular rate and rhythm Abdomen: obese, no obvious RLQ tenderness Extremities: extremities normal, atraumatic, no cyanosis or edema Pulses: 2+ and symmetric Skin: Skin color, texture, turgor normal. No rashes or lesions Neurologic: Grossly normal  EKG NSR  ASSESSMENT AND PLAN:   Chest pain with moderate risk of acute coronary syndrome Pt had an echo and Myoview in Dec 2016 for chest pain. Admitted to ED Sept 2017 with chest pain, r/o for MI. Recurrent chest pain over the weekend that sounds suspicious for angina  Coronary artery disease 20-30% LAD at cath in 2001 (Dr Melvern Banker) and 2012 (Dr Debara Pickett)- no other significant CAD. Myoview low risk Dec 2016  Diabetes mellitus type 2 in obese [facility]) This has been stable  Hypertension Controlled  Dyslipidemia On statin Rx followed by Dr Adela Lank  She has had documented minor CAD and a negtive Myoview Dec 2016. I am hesitant to write off her symptoms as GI though, especially as she is already on Nexium and has had a cholecystectomy. I ordered a Troponin I and a Lexiscan Myoview. Obviously if her Troponin is positive or Myoview abnormal she would need another coronary angiogram. F/U with Dr Debara Pickett. After her Myoview.  [name] PA-C 05/27/2016 2:36 PM

## 2016-05-27 NOTE — Assessment & Plan Note (Signed)
Pt had an echo and Myoview in Dec 2016 for chest pain. Admitted to ED Sept 2017 with chest pain, r/o for MI. Recurrent chest pain over the weekend that sounds suspicious for angina

## 2016-05-27 NOTE — Assessment & Plan Note (Signed)
This has been stable

## 2016-05-27 NOTE — Patient Instructions (Signed)
Medication Instructions:  TAKE Aspirin '81mg'$  Take 1 tablet once a day Nitrostat Prescription has been sent to your pharamacy  Labwork: Your physician recommends that you return for lab work in: TODAY-Troponin 1  Testing/Procedures: Your physician has requested that you have a lexiscan myoview. For further information please visit [email or web address]. Please follow instruction sheet, as given.  Follow-Up: Your physician recommends that you schedule a follow-up appointment in: [place].  Any Other Special Instructions Will Be Listed Below (If Applicable).     If you need a refill on your cardiac medications before your next appointment, please call your pharmacy.

## 2016-05-27 NOTE — Assessment & Plan Note (Signed)
Controlled.  

## 2016-05-28 LAB — TROPONIN I: Troponin I: <0.01 ng/mL (ref <=0.05)

## 2016-05-31 ENCOUNTER — Other Ambulatory Visit (HOSPITAL_COMMUNITY): Payer: Self-pay | Admitting: Family Medicine

## 2016-05-31 DIAGNOSIS — Z1231 Encounter for screening mammogram for malignant neoplasm of breast: Secondary | ICD-10-CM

## 2016-06-02 ENCOUNTER — Ambulatory Visit (HOSPITAL_COMMUNITY)
Admission: RE | Admit: 2016-06-02 | Discharge: 2016-06-02 | Disposition: A | Discharge status: Home / Self Care | Payer: Medicare Other | Source: Ambulatory Visit | Attending: Family Medicine | Admitting: Family Medicine

## 2016-06-02 DIAGNOSIS — Z1231 Encounter for screening mammogram for malignant neoplasm of breast: Secondary | ICD-10-CM | POA: Diagnosis not present

## 2016-06-07 ENCOUNTER — Telehealth: Payer: Self-pay | Admitting: Oncology

## 2016-06-07 ENCOUNTER — Other Ambulatory Visit (HOSPITAL_BASED_OUTPATIENT_CLINIC_OR_DEPARTMENT_OTHER): Payer: Medicare Other

## 2016-06-07 ENCOUNTER — Ambulatory Visit (HOSPITAL_BASED_OUTPATIENT_CLINIC_OR_DEPARTMENT_OTHER): Payer: Medicare Other | Admitting: Oncology

## 2016-06-07 VITALS — BP 136/54 | HR 67 | Temp 97.9°F | Resp 17 | Ht 64.0 in | Wt 218.3 lb

## 2016-06-07 DIAGNOSIS — Z85038 Personal history of other malignant neoplasm of large intestine: Secondary | ICD-10-CM | POA: Diagnosis not present

## 2016-06-07 DIAGNOSIS — E119 Type 2 diabetes mellitus without complications: Secondary | ICD-10-CM | POA: Diagnosis not present

## 2016-06-07 DIAGNOSIS — C7800 Secondary malignant neoplasm of unspecified lung: Principal | ICD-10-CM

## 2016-06-07 DIAGNOSIS — C189 Malignant neoplasm of colon, unspecified: Secondary | ICD-10-CM

## 2016-06-07 DIAGNOSIS — G620 Drug-induced polyneuropathy: Secondary | ICD-10-CM

## 2016-06-07 LAB — CEA (IN HOUSE-CHCC): CEA (CHCC-In House): 2.73 ng/mL (ref 0.00–5.00)

## 2016-06-07 NOTE — Telephone Encounter (Signed)
Appointments scheduled per 11/27 LOS. Patient given AVS report and calendars with future scheduled appointments.

## 2016-06-07 NOTE — Progress Notes (Signed)
[facility] OFFICE PROGRESS NOTE   Diagnosis: Colon cancer  INTERVAL HISTORY:   Ms. Depree returns as scheduled. She reports recent episodes of anterior chest discomfort that she feels may be related to reflux. She is being evaluated by cardiology and is scheduled for a "stress test "next month. Good appetite. She underwent a colonoscopy 11/23/2015. Polyps were removed from the rectum and transverse colon. The pathology returned consistent with tubular adenomas.  Objective:  Vital signs in last 24 hours:  Blood pressure (!) 136/54, pulse 67, temperature 97.9 F (36.6 C), temperature source Oral, resp. rate 17, height '5\' 4"'$  (1.626 m), weight 218 lb 4.8 oz (99 kg), SpO2 100 %.    HEENT: Neck without mass Lymphatics: No cervical, supraclavicular, axillary, or inguinal nodes Resp: Lungs clear bilaterally Cardio: Regular rate and rhythm GI: No hepatomegaly, no mass, nontender Vascular: No leg edema   Lab Results:  [name]-2.73 Medications: I have reviewed the patient's current medications.  Assessment/Plan: 1. 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 She began adjuvant CAPOX chemotherapy on 02/24/2012. She completed cycle 8 on 07/27/2012. Negative surveillance colonoscopy 12/08/2012 2. Delayed nausea following cycle 1 CAPOX. Aloxi was added beginning with cycle 2 with improvement. 3. Diabetes. 4. Microcytic anemia. Hemoglobin is normal. She will discontinue iron. 5. Remote history of a "colon tumor" removed endoscopically in 1980. 6. Left lower lung nodule noted on a CT 02/21/2012, slightly larger than on a CT 12/03/2011. Chest CT 06/12/2012 showed the previously seen 5 mm pulmonary nodule in left lower lobe was scarcely visualized, estimated at 3 mm. No other suspicious pulmonary nodules were identified. Chest CT 11/27/2012 showed interval enlargement of the left lower lobe pulmonary nodule with a current measurement of 7  mm x 8 mm. No new pulmonary nodules. CT 02/22/2013 with enlargement of the anterior left lower lobe nodule and no other lung nodules. PET scan 03/02/2013 showed no associated hypermetabolism with the 12 mm left lower lobe pulmonary nodule. No hypermetabolic lymph nodes in the neck, chest, abdomen or pelvis. No abnormal hypermetabolic activity within the liver, pancreas, adrenal glands or spleen. Abnormal FDG accumulation in the posterior left nasopharyngeal mucosa.  Status post a wedge resection of the left lower lobe nodule on 03/27/2013 with the pathology confirming metastatic adenocarcinoma of colorectal primary.   Chest CT 07/18/2013-negative for recurrent disease.   Chest CT 09/19/2013 to evaluate for shortness of breath showed postoperative changes in the left lower lobe without evidence of recurrent or metastatic disease.  CT chest 04/01/2014 with postsurgical scarring in the left lower lobe. No evidence for metastatic disease in the chest. Stable appearance of mild subpleural reticulation of the lung bases.  CT chest/abdomen/pelvis 10/01/2014 with postoperative changes left lower lobe. No evidence of metastatic disease in the chest, abdomen or pelvis.   CT chest/abdomen/pelvis 11/03/2015 with no evidence of recurrent malignancy.  Colonoscopy 03/25/2016-tubular adenomas removed from the transverse colon and rectum 7. Status post Port-A-Cath placement 02/22/2012. Status post Port-A-Cath repositioning 03/14/2012. Port-A-Cath removed 06/04/2014 8. Probable acute laryngopharyngeal dysesthesia following cycle 2 CAPOX. Symptoms resolved with warming.  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 She has persistent neuropathy symptoms involving the feet. Improved with Neurontin 10. History of anterior chest pain-she reports undergoing an evaluation by Dr. Debara Pickett. 11. 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     Disposition:  Ms.  Korf remains in clinical remission from colon cancer. The [name] is normal. She will return for  an office visit and [name] in 6 months.  We discussed the indication for surveillance CT scans against CT scans for now.  [name], MD  06/07/2016  2:39 PM

## 2016-06-09 ENCOUNTER — Telehealth (HOSPITAL_COMMUNITY): Payer: Self-pay

## 2016-06-09 ENCOUNTER — Telehealth: Payer: Self-pay

## 2016-06-09 NOTE — Telephone Encounter (Signed)
-----   Message from Ladell Pier, MD sent at 06/08/2016  8:37 PM EST ----- Please call patient, cea is normal

## 2016-06-09 NOTE — Telephone Encounter (Signed)
Encounter complete. 

## 2016-06-09 NOTE — Telephone Encounter (Signed)
Called and informed pt of normal cea, pt verbalized understanding.

## 2016-06-15 ENCOUNTER — Ambulatory Visit (HOSPITAL_COMMUNITY)
Admission: RE | Admit: 2016-06-15 | Discharge: 2016-06-15 | Disposition: A | Discharge status: Home / Self Care | Payer: Medicare Other | Source: Ambulatory Visit | Attending: Cardiology | Admitting: Cardiology

## 2016-06-15 DIAGNOSIS — R079 Chest pain, unspecified: Secondary | ICD-10-CM

## 2016-06-15 MED ORDER — TECHNETIUM TC 99M TETROFOSMIN IV KIT
10.4000 | PACK | Freq: Once | INTRAVENOUS | Status: AC | PRN
  Administered 2016-06-15: 10.4 via INTRAVENOUS
  Filled 2016-06-15: qty 11

## 2016-06-15 MED ORDER — AMINOPHYLLINE 25 MG/ML IV SOLN
75.0000 mg | Freq: Once | INTRAVENOUS | Status: AC
  Administered 2016-06-15: 75 mg via INTRAVENOUS

## 2016-06-15 MED ORDER — TECHNETIUM TC 99M TETROFOSMIN IV KIT
29.4000 | PACK | Freq: Once | INTRAVENOUS | Status: AC | PRN
  Administered 2016-06-15: 29.4 via INTRAVENOUS
  Filled 2016-06-15: qty 30

## 2016-06-15 MED ORDER — REGADENOSON 0.4 MG/5ML IV SOLN
0.4000 mg | Freq: Once | INTRAVENOUS | Status: AC
  Administered 2016-06-15: 0.4 mg via INTRAVENOUS

## 2016-06-17 ENCOUNTER — Other Ambulatory Visit: Payer: Self-pay | Admitting: *Deleted

## 2016-06-17 ENCOUNTER — Telehealth: Payer: Self-pay | Admitting: Cardiology

## 2016-06-17 DIAGNOSIS — R9439 Abnormal result of other cardiovascular function study: Secondary | ICD-10-CM

## 2016-06-17 DIAGNOSIS — Z01810 Encounter for preprocedural cardiovascular examination: Secondary | ICD-10-CM

## 2016-06-17 LAB — MYOCARDIAL PERFUSION IMAGING
LV dias vol: 59 mL (ref 46–106)
LV sys vol: 25 mL
Peak HR: 85 {beats}/min
Rest HR: 64 {beats}/min
SDS: 5
SRS: 2
SSS: 7
TID: 1.52

## 2016-06-17 NOTE — Telephone Encounter (Signed)
New message ° ° ° °Pt verbalized that she is returning call for rn  °

## 2016-06-17 NOTE — Telephone Encounter (Signed)
Spoke to patient. This is in regards to my call to her this AM to discuss results and need for cath. Aware we will order and have scheduler contact her to set up test. She's also been advised on preprocedure labs and I will order these for [facility] - she can have drawn at [facility] location.

## 2016-06-18 ENCOUNTER — Encounter (HOSPITAL_COMMUNITY): Payer: Self-pay | Admitting: Cardiology

## 2016-06-18 ENCOUNTER — Emergency Department (HOSPITAL_COMMUNITY): Payer: Medicare Other

## 2016-06-18 ENCOUNTER — Encounter (HOSPITAL_COMMUNITY)
Admission: EM | Disposition: A | Discharge status: Home / Self Care | Payer: Self-pay | Source: Home / Self Care | Attending: Cardiovascular Disease

## 2016-06-18 ENCOUNTER — Inpatient Hospital Stay (HOSPITAL_COMMUNITY)
Admission: EM | Admit: 2016-06-18 | Discharge: 2016-06-19 | DRG: 287 | Disposition: A | Discharge status: Home / Self Care | Payer: Medicare Other | Attending: Cardiovascular Disease | Admitting: Cardiovascular Disease

## 2016-06-18 ENCOUNTER — Ambulatory Visit (HOSPITAL_COMMUNITY): Admission: RE | Admit: 2016-06-18 | Payer: Medicare Other | Source: Ambulatory Visit | Admitting: Cardiovascular Disease

## 2016-06-18 DIAGNOSIS — C7800 Secondary malignant neoplasm of unspecified lung: Secondary | ICD-10-CM | POA: Diagnosis present

## 2016-06-18 DIAGNOSIS — Z9842 Cataract extraction status, left eye: Secondary | ICD-10-CM

## 2016-06-18 DIAGNOSIS — E669 Obesity, unspecified: Secondary | ICD-10-CM | POA: Diagnosis present

## 2016-06-18 DIAGNOSIS — E785 Hyperlipidemia, unspecified: Secondary | ICD-10-CM | POA: Diagnosis present

## 2016-06-18 DIAGNOSIS — M109 Gout, unspecified: Secondary | ICD-10-CM | POA: Diagnosis present

## 2016-06-18 DIAGNOSIS — I200 Unstable angina: Secondary | ICD-10-CM | POA: Diagnosis not present

## 2016-06-18 DIAGNOSIS — K219 Gastro-esophageal reflux disease without esophagitis: Secondary | ICD-10-CM | POA: Diagnosis present

## 2016-06-18 DIAGNOSIS — I2510 Atherosclerotic heart disease of native coronary artery without angina pectoris: Secondary | ICD-10-CM | POA: Diagnosis present

## 2016-06-18 DIAGNOSIS — Z886 Allergy status to analgesic agent status: Secondary | ICD-10-CM

## 2016-06-18 DIAGNOSIS — Z801 Family history of malignant neoplasm of trachea, bronchus and lung: Secondary | ICD-10-CM

## 2016-06-18 DIAGNOSIS — E1159 Type 2 diabetes mellitus with other circulatory complications: Secondary | ICD-10-CM | POA: Diagnosis not present

## 2016-06-18 DIAGNOSIS — Z9221 Personal history of antineoplastic chemotherapy: Secondary | ICD-10-CM | POA: Diagnosis not present

## 2016-06-18 DIAGNOSIS — E1140 Type 2 diabetes mellitus with diabetic neuropathy, unspecified: Secondary | ICD-10-CM | POA: Diagnosis present

## 2016-06-18 DIAGNOSIS — Z85038 Personal history of other malignant neoplasm of large intestine: Secondary | ICD-10-CM | POA: Diagnosis not present

## 2016-06-18 DIAGNOSIS — Z961 Presence of intraocular lens: Secondary | ICD-10-CM | POA: Diagnosis present

## 2016-06-18 DIAGNOSIS — E119 Type 2 diabetes mellitus without complications: Secondary | ICD-10-CM | POA: Diagnosis present

## 2016-06-18 DIAGNOSIS — I10 Essential (primary) hypertension: Secondary | ICD-10-CM

## 2016-06-18 DIAGNOSIS — R079 Chest pain, unspecified: Secondary | ICD-10-CM | POA: Diagnosis present

## 2016-06-18 DIAGNOSIS — I25110 Atherosclerotic heart disease of native coronary artery with unstable angina pectoris: Secondary | ICD-10-CM

## 2016-06-18 DIAGNOSIS — C189 Malignant neoplasm of colon, unspecified: Secondary | ICD-10-CM | POA: Diagnosis present

## 2016-06-18 DIAGNOSIS — E1169 Type 2 diabetes mellitus with other specified complication: Secondary | ICD-10-CM | POA: Diagnosis present

## 2016-06-18 HISTORY — PX: CARDIAC CATHETERIZATION: SHX172

## 2016-06-18 LAB — CBC
HCT: 38.1 % (ref 36.0–46.0)
HEMATOCRIT: 35.4 % — AB (ref 36.0–46.0)
Hemoglobin: 12.5 g/dL (ref 12.0–15.0)
Hemoglobin: 11.6 g/dL — ABNORMAL LOW (ref 12.0–15.0)
MCH: 27 pg (ref 26.0–34.0)
MCH: 26.4 pg (ref 26.0–34.0)
MCHC: 32.8 g/dL (ref 30.0–36.0)
MCHC: 32.8 g/dL (ref 30.0–36.0)
MCV: 82.3 fL (ref 78.0–100.0)
MCV: 80.5 fL (ref 78.0–100.0)
PLATELETS: 207 10*3/uL (ref 150–400)
Platelets: 202 10*3/uL (ref 150–400)
RBC: 4.63 MIL/uL (ref 3.87–5.11)
RBC: 4.4 MIL/uL (ref 3.87–5.11)
RDW: 14 % (ref 11.5–15.5)
RDW: 14 % (ref 11.5–15.5)
WBC: 6.1 10*3/uL (ref 4.0–10.5)
WBC: 5.2 10*3/uL (ref 4.0–10.5)

## 2016-06-18 LAB — BASIC METABOLIC PANEL
Anion gap: 9 (ref 5–15)
BUN: 14 mg/dL (ref 6–20)
CALCIUM: 9 mg/dL (ref 8.9–10.3)
CO2: 28 mmol/L (ref 22–32)
CREATININE: 1.02 mg/dL — AB (ref 0.44–1.00)
Chloride: 101 mmol/L (ref 101–111)
GFR, EST NON AFRICAN AMERICAN: 53 mL/min — AB (ref >60)
Glucose, Bld: 165 mg/dL — ABNORMAL HIGH (ref 65–99)
Potassium: 3.5 mmol/L (ref 3.5–5.1)
SODIUM: 138 mmol/L (ref 135–145)

## 2016-06-18 LAB — PROTIME-INR
INR: 1.03
PROTHROMBIN TIME: 13.6 s (ref 11.4–15.2)

## 2016-06-18 LAB — APTT: APTT: 38 s — AB (ref 24–36)

## 2016-06-18 LAB — TROPONIN I

## 2016-06-18 LAB — GLUCOSE, CAPILLARY
GLUCOSE-CAPILLARY: 147 mg/dL — AB (ref 65–99)
Glucose-Capillary: 145 mg/dL — ABNORMAL HIGH (ref 65–99)

## 2016-06-18 LAB — CREATININE, SERUM: Creatinine, Ser: 0.9 mg/dL (ref 0.44–1.00)

## 2016-06-18 SURGERY — LEFT HEART CATH AND CORONARY ANGIOGRAPHY
Anesthesia: LOCAL

## 2016-06-18 MED ORDER — SODIUM CHLORIDE 0.9% FLUSH
3.0000 mL | Freq: Two times a day (BID) | INTRAVENOUS | Status: DC
  Administered 2016-06-18: 3 mL via INTRAVENOUS

## 2016-06-18 MED ORDER — HEPARIN SODIUM (PORCINE) 1000 UNIT/ML IJ SOLN
INTRAMUSCULAR | Status: DC | PRN
  Administered 2016-06-18: 5000 [IU] via INTRAVENOUS

## 2016-06-18 MED ORDER — POLYVINYL ALCOHOL 1.4 % OP SOLN
2.0000 [drp] | OPHTHALMIC | Status: DC | PRN
  Filled 2016-06-18: qty 15

## 2016-06-18 MED ORDER — LORATADINE 10 MG PO TABS
10.0000 mg | ORAL_TABLET | Freq: Every day | ORAL | Status: DC
  Administered 2016-06-19: 10 mg via ORAL
  Filled 2016-06-18: qty 1

## 2016-06-18 MED ORDER — SODIUM CHLORIDE 0.9 % IV SOLN
INTRAVENOUS | Status: AC
  Administered 2016-06-18: 22:00:00 via INTRAVENOUS

## 2016-06-18 MED ORDER — NITROGLYCERIN 0.4 MG SL SUBL: 0.4000 mg | SUBLINGUAL_TABLET | SUBLINGUAL | Status: DC | PRN

## 2016-06-18 MED ORDER — PANTOPRAZOLE SODIUM 40 MG PO TBEC
40.0000 mg | DELAYED_RELEASE_TABLET | Freq: Every day | ORAL | Status: DC
  Administered 2016-06-19: 40 mg via ORAL
  Filled 2016-06-18: qty 1

## 2016-06-18 MED ORDER — DIPHENOXYLATE-ATROPINE 2.5-0.025 MG PO TABS: 1.0000 | ORAL_TABLET | Freq: Four times a day (QID) | ORAL | Status: DC | PRN

## 2016-06-18 MED ORDER — MELATONIN 5 MG PO TABS: 5.0000 mg | ORAL_TABLET | Freq: Every day | ORAL | Status: DC

## 2016-06-18 MED ORDER — ALLOPURINOL 100 MG PO TABS
100.0000 mg | ORAL_TABLET | Freq: Every day | ORAL | Status: DC
  Administered 2016-06-19: 100 mg via ORAL
  Filled 2016-06-18: qty 1

## 2016-06-18 MED ORDER — ACETAMINOPHEN 325 MG PO TABS
650.0000 mg | ORAL_TABLET | ORAL | Status: DC | PRN
  Administered 2016-06-19: 650 mg via ORAL
  Filled 2016-06-18: qty 2

## 2016-06-18 MED ORDER — FENTANYL CITRATE (PF) 100 MCG/2ML IJ SOLN
INTRAMUSCULAR | Status: DC | PRN
  Administered 2016-06-18: 25 ug via INTRAVENOUS

## 2016-06-18 MED ORDER — ISOSORBIDE MONONITRATE ER 30 MG PO TB24
30.0000 mg | ORAL_TABLET | Freq: Every day | ORAL | Status: DC
  Administered 2016-06-18 – 2016-06-19 (×2): 30 mg via ORAL
  Filled 2016-06-18 (×2): qty 1

## 2016-06-18 MED ORDER — PROPRANOLOL HCL 40 MG PO TABS
40.0000 mg | ORAL_TABLET | Freq: Every day | ORAL | Status: DC
  Administered 2016-06-19: 40 mg via ORAL
  Filled 2016-06-18: qty 1

## 2016-06-18 MED ORDER — VERAPAMIL HCL 2.5 MG/ML IV SOLN
INTRAVENOUS | Status: DC | PRN
  Administered 2016-06-18: 10 mL via INTRA_ARTERIAL

## 2016-06-18 MED ORDER — IOPAMIDOL (ISOVUE-370) INJECTION 76%
INTRAVENOUS | Status: DC | PRN
  Administered 2016-06-18: 60 mL via INTRAVENOUS

## 2016-06-18 MED ORDER — HEPARIN BOLUS VIA INFUSION
4000.0000 [IU] | Freq: Once | INTRAVENOUS | Status: AC
  Administered 2016-06-18: 4000 [IU] via INTRAVENOUS

## 2016-06-18 MED ORDER — SIMVASTATIN 40 MG PO TABS
40.0000 mg | ORAL_TABLET | Freq: Every day | ORAL | Status: DC
  Administered 2016-06-18: 40 mg via ORAL
  Filled 2016-06-18 (×2): qty 1

## 2016-06-18 MED ORDER — PROPRANOLOL-HCTZ 40-25 MG PO TABS: 1.0000 | ORAL_TABLET | Freq: Every day | ORAL | Status: DC

## 2016-06-18 MED ORDER — CARBOXYMETHYLCELLULOSE SODIUM 0.5 % OP SOLN: 2.0000 [drp] | Freq: Three times a day (TID) | OPHTHALMIC | Status: DC | PRN

## 2016-06-18 MED ORDER — HEPARIN SODIUM (PORCINE) 5000 UNIT/ML IJ SOLN
5000.0000 [IU] | Freq: Three times a day (TID) | INTRAMUSCULAR | Status: DC
  Administered 2016-06-18 – 2016-06-19 (×2): 5000 [IU] via SUBCUTANEOUS
  Filled 2016-06-18 (×2): qty 1

## 2016-06-18 MED ORDER — ONDANSETRON HCL 4 MG/2ML IJ SOLN: 4.0000 mg | Freq: Four times a day (QID) | INTRAMUSCULAR | Status: DC | PRN

## 2016-06-18 MED ORDER — MIDAZOLAM HCL 2 MG/2ML IJ SOLN
INTRAMUSCULAR | Status: DC | PRN
  Administered 2016-06-18: 1 mg via INTRAVENOUS

## 2016-06-18 MED ORDER — ASPIRIN 81 MG PO CHEW
81.0000 mg | CHEWABLE_TABLET | Freq: Every day | ORAL | Status: DC
  Administered 2016-06-19: 81 mg via ORAL
  Filled 2016-06-18: qty 1

## 2016-06-18 MED ORDER — HEPARIN (PORCINE) IN NACL 2-0.9 UNIT/ML-% IJ SOLN
INTRAMUSCULAR | Status: DC | PRN
  Administered 2016-06-18: 1000 mL

## 2016-06-18 MED ORDER — AMLODIPINE BESYLATE 5 MG PO TABS
5.0000 mg | ORAL_TABLET | Freq: Every day | ORAL | Status: DC
  Administered 2016-06-18 – 2016-06-19 (×2): 5 mg via ORAL
  Filled 2016-06-18 (×2): qty 1

## 2016-06-18 MED ORDER — NITROGLYCERIN 0.4 MG SL SUBL
SUBLINGUAL_TABLET | SUBLINGUAL | Status: AC
  Filled 2016-06-18: qty 1

## 2016-06-18 MED ORDER — HYDROCHLOROTHIAZIDE 25 MG PO TABS
25.0000 mg | ORAL_TABLET | Freq: Every day | ORAL | Status: DC
  Administered 2016-06-19: 25 mg via ORAL
  Filled 2016-06-18: qty 1

## 2016-06-18 MED ORDER — NITROGLYCERIN 0.4 MG SL SUBL
0.4000 mg | SUBLINGUAL_TABLET | Freq: Once | SUBLINGUAL | Status: AC
  Administered 2016-06-18: 0.4 mg via SUBLINGUAL

## 2016-06-18 MED ORDER — RAMIPRIL 5 MG PO CAPS
5.0000 mg | ORAL_CAPSULE | Freq: Every day | ORAL | Status: DC
  Administered 2016-06-19: 5 mg via ORAL
  Filled 2016-06-18: qty 1

## 2016-06-18 MED ORDER — SODIUM CHLORIDE 0.9 % IV SOLN: 250.0000 mL | INTRAVENOUS | Status: DC | PRN

## 2016-06-18 MED ORDER — SODIUM CHLORIDE 0.9% FLUSH: 3.0000 mL | INTRAVENOUS | Status: DC | PRN

## 2016-06-18 MED ORDER — ASPIRIN 325 MG PO TABS
325.0000 mg | ORAL_TABLET | Freq: Once | ORAL | Status: AC
  Administered 2016-06-18: 325 mg via ORAL
  Filled 2016-06-18: qty 1

## 2016-06-18 MED ORDER — LINAGLIPTIN 5 MG PO TABS
5.0000 mg | ORAL_TABLET | Freq: Every day | ORAL | Status: DC
  Administered 2016-06-19: 5 mg via ORAL
  Filled 2016-06-18: qty 1

## 2016-06-18 MED ORDER — LIDOCAINE HCL (PF) 1 % IJ SOLN
INTRAMUSCULAR | Status: DC | PRN
  Administered 2016-06-18: 2 mL

## 2016-06-18 MED ORDER — GABAPENTIN 100 MG PO CAPS
100.0000 mg | ORAL_CAPSULE | Freq: Every day | ORAL | Status: DC
  Administered 2016-06-18: 100 mg via ORAL
  Filled 2016-06-18: qty 1

## 2016-06-18 MED ORDER — HEPARIN (PORCINE) IN NACL 100-0.45 UNIT/ML-% IJ SOLN
1000.0000 [IU]/h | INTRAMUSCULAR | Status: DC
  Administered 2016-06-18: 1000 [IU]/h via INTRAVENOUS
  Filled 2016-06-18: qty 250

## 2016-06-18 MED ORDER — INSULIN ASPART 100 UNIT/ML ~~LOC~~ SOLN
0.0000 [IU] | Freq: Three times a day (TID) | SUBCUTANEOUS | Status: DC
  Administered 2016-06-19: 2 [IU] via SUBCUTANEOUS

## 2016-06-18 SURGICAL SUPPLY — 11 items
CATH DIAG 6FR PIGTAIL ANGLED (CATHETERS) ×2 IMPLANT
CATH OPTITORQUE TIG 4.0 5F (CATHETERS) ×2 IMPLANT
DEVICE RAD COMP TR BAND LRG (VASCULAR PRODUCTS) ×2 IMPLANT
GLIDESHEATH SLEND SS 6F .021 (SHEATH) ×2 IMPLANT
GUIDEWIRE INQWIRE 1.5J.035X260 (WIRE) ×1 IMPLANT
INQWIRE 1.5J .035X260CM (WIRE) ×2
KIT HEART LEFT (KITS) ×2 IMPLANT
PACK CARDIAC CATHETERIZATION (CUSTOM PROCEDURE TRAY) ×2 IMPLANT
SYR MEDRAD MARK V 150ML (SYRINGE) ×2 IMPLANT
TRANSDUCER W/STOPCOCK (MISCELLANEOUS) ×2 IMPLANT
TUBING CIL FLEX 10 FLL-RA (TUBING) ×2 IMPLANT

## 2016-06-18 NOTE — Progress Notes (Signed)
ANTICOAGULATION CONSULT NOTE - Initial Consult  Pharmacy Consult for HEPARIN Indication: chest pain/ACS  Allergies  Allergen Reactions  . Aspirin Palpitations   Patient Measurements: Height: '5\' 4"'$  (162.6 cm) Weight: 217 lb (98.4 kg) IBW/kg (Calculated) : 54.7 HEPARIN DW (KG): 77.4  Vital Signs: Temp: 98.3 F (36.8 C) (12/08 0727) Temp Source: Oral (12/08 0727) BP: 134/91 (12/08 0800) Pulse Rate: 90 (12/08 0800)  Labs:  Recent Labs  06/18/16 0739  HGB 12.5  HCT 38.1  PLT 207  CREATININE 1.02*  TROPONINI <0.03   Estimated Creatinine Clearance: 55.2 mL/min (by C-G formula based on SCr of 1.02 mg/dL (H)).  Medical History: Past Medical History:  Diagnosis Date  . Arthritis   . Asthmatic bronchitis 2011  . 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 s/p right hemicolectomy/chemo 2013 with mets to the lung s/p LLL wedge resection 2014.  . Diabetes mellitus    x over 10 yrs  . Family history of adverse reaction to anesthesia    FAMILY MEMBERS HAVE HAD N/V  . GERD (gastroesophageal reflux disease)   . Gout   . H/O hiatal hernia   . Headache(784.0)    hx migraines  . History of colon cancer   . History of shingles   . Hyperlipidemia   . Hypertension    EKG 10/12 EPIC, chest- 1 view  6/13 EPIC    Last 2D Echo on 05/02/2012 showed EF of greater than 55%  . Microcytic anemia   . Mild CAD    a. cath by Dr. Debara Pickett in 03/2011 - 20-30% ostial bifurcation stenosis of LAD, otherwise only mild luminal irregularities in LAD/RCA, LVEF 60%, no RWMA.  . Neuropathy ([place])   . Obesity    Medications:   (Not in a hospital admission)  Assessment: 75yo female c/o CP.  Asked to initiate Heparin for ACS.  Baseline aPTT 38, INR 1.03.  No anticoagulants listed on PTA med list.    Goal of Therapy:  Heparin level 0.3-0.7 units/ml Monitor platelets by anticoagulation protocol: Yes   Plan:  Heparin 4000 units bolus x 1 now Heparin infusion at 1000 units/hr Check  heparin level in 6-8 hrs then daily CBC daily while on Heparin  [place] Crane, Evonne Rinks A 06/18/2016,8:37 AM

## 2016-06-18 NOTE — H&P (Signed)
Cardiology History & Physical    Patient ID: ZAKIYYAH SAVANNAH MRN: [id number], DOB: [birth year] Date of Encounter: 06/18/2016, 1:06 PM Primary Physician: Maggie Font, MD Primary Cardiologist: Mali Hilty, MD  Chief Complaint: Chest pain Reason for Admission: Chest pain Requesting MD: Jola Schmidt, MD  HPI: Ms. Gorgas is a 75 year old woman with history of diabetes mellitus, hypertension, hyperlipidemia, colon cancer with lung metastases status post resection and chemotherapy, and recurrent chest pain who presents for evaluation of chest pain. She was seen in the office by [name] on 05/27/16 for intermittent chest pressure. She was subsequently referred for myocardial perfusion stress test, which was performed earlier this week. This did not show any areas of ischemia or infarct but was notable for TID (TID ratio 1.52). Based on these findings, she was referred for cardiac catheterization, to be done next week. However, this morning, she developed severe chest pressure with accompanying shortness of breath and nausea. She took sublingual nitroglycerin without complete relief, prompting her to go to the [facility] emergency department. There, she received additional nitroglycerin with improvement in her chest pain. She is now on a nitroglycerin infusion without significant discomfort. She notes that the pain is similar to what she has experienced in the past, including when she has undergone catheterization (2001 and 2011).  The patient has stable 3 pillow orthopnea and exertional dyspnea. She denies lower extremity edema as well as palpitations and lightheadedness. She denies a history of stroke. She has not had any recent bleeding. She does not have any upcoming surgeries.  Past Medical History:  Diagnosis Date  . Arthritis   . Asthmatic bronchitis 2011  . 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 s/p right hemicolectomy/chemo 2013 with mets to the lung s/p LLL wedge  resection 2014.  . Diabetes mellitus    x over 10 yrs  . Family history of adverse reaction to anesthesia    FAMILY MEMBERS HAVE HAD N/V  . GERD (gastroesophageal reflux disease)   . Gout   . H/O hiatal hernia   . Headache(784.0)    hx migraines  . History of colon cancer   . History of shingles   . Hyperlipidemia   . Hypertension    EKG 10/12 EPIC, chest- 1 view  6/13 EPIC    Last 2D Echo on 05/02/2012 showed EF of greater than 55%  . Microcytic anemia   . Mild CAD    a. cath by Dr. Debara Pickett in 03/2011 - 20-30% ostial bifurcation stenosis of LAD, otherwise only mild luminal irregularities in LAD/RCA, LVEF 60%, no RWMA.  . Neuropathy ([place])   . Obesity      Surgical History:  Past Surgical History:  Procedure Laterality Date  . ABDOMINAL HYSTERECTOMY    . APPENDECTOMY    . CARDIAC CATHETERIZATION  [id number]  . CATARACT EXTRACTION W/PHACO Left 02/17/2015   Procedure: CATARACT EXTRACTION PHACO AND INTRAOCULAR LENS PLACEMENT (IOC);  Surgeon: Tonny Branch, MD;  Location: [facility];  Service: Ophthalmology;  Laterality: Left;  CDE:8.01   . CHOLECYSTECTOMY    . COLON SURGERY     PARTIAL COLECTOMY 30 YRS / AGAIN 2013  . COLONOSCOPY  11/26/2011   Procedure: COLONOSCOPY;  Surgeon: Rogene Houston, MD;  Location: [facility];  Service: Endoscopy;  Laterality: N/A;  2:15  . COLONOSCOPY N/A 12/08/2012   Procedure: COLONOSCOPY;  Surgeon: Rogene Houston, MD;  Location: [facility];  Service: Endoscopy;  Laterality: N/A;  815-moved to  [place] to notify pt  . COLONOSCOPY N/A 03/25/2016   Procedure: COLONOSCOPY;  Surgeon: Rogene Houston, MD;  Location: [facility];  Service: Endoscopy;  Laterality: N/A;  1200  . ESOPHAGOGASTRODUODENOSCOPY  06/16/2011   Procedure: ESOPHAGOGASTRODUODENOSCOPY (EGD);  Surgeon: Rogene Houston, MD;  Location: [facility];  Service: Endoscopy;  Laterality: N/A;  2:15  . PORT-A-CATH REMOVAL N/A 06/04/2014   Procedure: REMOVAL PORT-A-CATH;  Surgeon: Pedro Earls,  MD;  Location: [facility];  Service: General;  Laterality: N/A;  . PORTACATH PLACEMENT  02/22/2012   Procedure: INSERTION PORT-A-CATH;  Surgeon: Pedro Earls, MD;  Location: [facility];  Service: General;  Laterality: N/A;  left subclavian  . VIDEO ASSISTED THORACOSCOPY (VATS)/WEDGE RESECTION Left 03/27/2013   Procedure: VIDEO ASSISTED THORACOSCOPY (VATS)/WEDGE RESECTION;  Surgeon: Grace Isaac, MD;  Location: [facility];  Service: Thoracic;  Laterality: Left;  Marland Kitchen VIDEO BRONCHOSCOPY N/A 03/27/2013   Procedure: VIDEO BRONCHOSCOPY;  Surgeon: Grace Isaac, MD;  [facility];  Service: Thoracic;  Laterality: N/A;     Home Meds: Prior to Admission medications   Medication Sig Start Date Robinette Esters Date Taking? Authorizing Provider  allopurinol (ZYLOPRIM) 100 MG tablet Take 100 mg by mouth daily. 04/28/16  Yes Historical Provider, MD  amLODipine (NORVASC) 5 MG tablet Take 5 mg by mouth daily as needed. Only take when BP readings are high   Yes Historical Provider, MD  Cyanocobalamin (B-12 PO) Take 1 tablet by mouth daily.   Yes Historical Provider, MD  esomeprazole (NEXIUM) 40 MG capsule Take 40 mg by mouth every evening. 07/16/11  Yes Rogene Houston, MD  fexofenadine (ALLEGRA) 180 MG tablet Take 180 mg by mouth daily. For Allergies   Yes Historical Provider, MD  gabapentin (NEURONTIN) 100 MG capsule Take 100 mg by mouth at bedtime.    Yes Historical Provider, MD  KLOR-CON M20 20 MEQ tablet TAKE 1 TABLET (20 MEQ TOTAL) BY MOUTH DAILY. 05/12/16  Yes Owens Shark, NP  linagliptin (TRADJENTA) 5 MG TABS tablet Take 5 mg by mouth daily after breakfast.    Yes Historical Provider, MD  Melatonin 5 MG TABS Take 5 mg by mouth daily.   Yes Historical Provider, MD  nitroGLYCERIN (NITROSTAT) 0.4 MG SL tablet Place 1 tablet (0.4 mg total) under the tongue every 5 (five) minutes as needed for chest pain. 05/27/16 08/25/16 Yes Luke K Kilroy, PA-C  propranolol-hydrochlorothiazide (INDERIDE) 40-25 MG per tablet Take 1 tablet  by mouth daily with breakfast. 04/06/13  Yes Donielle Liston Alba, PA-C  ramipril (ALTACE) 5 MG capsule Take 5 mg by mouth daily with breakfast.    Yes Historical Provider, MD  simvastatin (ZOCOR) 40 MG tablet Take 40 mg by mouth at bedtime.    Yes Historical Provider, MD  vitamin C (ASCORBIC ACID) 500 MG tablet Take 500 mg by mouth daily.   Yes Historical Provider, MD  acetaminophen (TYLENOL) 500 MG tablet Take 1,000 mg by mouth every 6 (six) hours as needed for mild pain or moderate pain. For pain    Historical Provider, MD  aspirin EC 81 MG tablet Take 1 tablet (81 mg total) by mouth daily. Patient not taking: Reported on 06/17/2016 06/17/15   Jonetta Osgood, MD  carboxymethylcellulose (REFRESH PLUS) 0.5 % SOLN Place 2 drops into both eyes 3 (three) times daily as needed (dry eyes).     Historical Provider, MD  diphenoxylate-atropine (LOMOTIL) 2.5-0.025 MG tablet Take 1 tablet by mouth 4 (four) times  daily as needed for diarrhea or loose stools.    Historical Provider, MD    Allergies:  Allergies  Allergen Reactions  . Aspirin Palpitations    Social History   Social History  . Marital status: Married    Spouse name: N/A  . Number of children: 4  . Years of education: N/A   Occupational History  .  Retired   Social History Main Topics  . Smoking status: Never Smoker  . Smokeless tobacco: Never Used  . Alcohol use No  . Drug use: No  . Sexual activity: Not on file   Other Topics Concern  . Not on file   Social History Narrative   Married to husband, Herbie [place]   Retired Engineer, manufacturing systems     Family History  Problem Relation Age of Onset  . Cancer Maternal Aunt     throat,     Review of Systems: A 12-system review of systems was performed and is negative except as noted in the HPI.  Labs:  Lab Results  Component Value Date   WBC 6.1 06/18/2016   HGB 12.5 06/18/2016   HCT 38.1 06/18/2016   MCV 82.3 06/18/2016   PLT 207 06/18/2016    Recent Labs Lab 06/18/16 0739   NA 138  K 3.5  CL 101  CO2 28  BUN 14  CREATININE 1.02*  CALCIUM 9.0  GLUCOSE 165*    Recent Labs  06/18/16 0739  TROPONINI <0.03   Lab Results  Component Value Date   CHOL 133 06/17/2015   HDL 40 (L) 06/17/2015   LDLCALC 79 06/17/2015   TRIG 70 06/17/2015   Lab Results  Component Value Date   DDIMER 1.32 (H) 06/16/2015    Radiology/Studies:  Dg Chest 2 View  Result Date: 06/18/2016 CLINICAL DATA:  Worsening central chest pressure and shortness of breath since yesterday. EXAM: CHEST  2 VIEW COMPARISON:  03/27/2016 FINDINGS: Lung markings are slightly prominent but minimally changed. Heart size is within normal limits. The trachea is midline. Degenerative changes in the lower cervical spine and throughout the thoracic spine. Lungs are clear without focal airspace disease or pulmonary edema. No large pleural effusions. IMPRESSION: No active cardiopulmonary disease. Electronically Signed   By: Markus Daft M.D.   On: 06/18/2016 08:26   Mm Screening Breast Tomo Bilateral  Result Date: 06/05/2016 CLINICAL DATA:  Screening. EXAM: 2D DIGITAL SCREENING BILATERAL MAMMOGRAM WITH CAD AND ADJUNCT TOMO COMPARISON:  Previous exam(s). ACR Breast Density Category b: There are scattered areas of fibroglandular density. FINDINGS: There are no findings suspicious for malignancy. Images were processed with CAD. IMPRESSION: No mammographic evidence of malignancy. A result letter of this screening mammogram will be mailed directly to the patient. RECOMMENDATION: Screening mammogram in one year. (Code:SM-B-01Y) BI-RADS CATEGORY  1: Negative. Electronically Signed   By: Ammie Ferrier M.D.   On: 06/05/2016 12:19   Wt Readings from Last 3 Encounters:  06/18/16 217 lb (98.4 kg)  06/15/16 216 lb (98 kg)  06/07/16 218 lb 4.8 oz (99 kg)   EKG: Sinus tachycardia with nonspecific T-wave changes.  Physical Exam: Blood pressure 153/90, pulse 83, temperature 98.3 F (36.8 C), temperature source Oral,  resp. rate 20, height '5\' 4"'$  (1.626 m), weight 217 lb (98.4 kg), SpO2 98 %. Body mass index is 37.25 kg/m. General: Obese woman, seated comfortably on a stretcher. Head: Normocephalic, atraumatic, sclera non-icteric, no xanthomas, nares are without discharge.  Neck: Negative for carotid bruits. JVD not elevated, though evaluation is  limited by body habitus. Lungs: Distant breath sounds without wheezes or crackles. Normal work of breathing. Heart: RRR with S1 S2. No murmurs, rubs, or gallops appreciated. Abdomen: Soft, non-tender, non-distended with normoactive bowel sounds. No hepatomegaly. No rebound/guarding. No obvious abdominal masses. Msk:  Strength and tone appear normal for age. Extremities: No clubbing or cyanosis. No edema.  Radial pulses are 2+ bilaterally. Neuro: Alert and oriented X 3. No focal deficit. No facial asymmetry. Moves all extremities spontaneously. Psych:  Responds to questions appropriately with a normal affect.    Assessment and Plan  75 year old woman with history of recurrent chest pain with nonobstructive CAD by prior catheterizations as recently as 2012, admitted with abnormal stress test earlier this week showing TID, as well as worsening chest pain.  Chest pain: Patient's symptoms have been present at rest and seem to be worsening, consistent with unstable angina. Troponin in the emergency department was negative. EKG shows nonspecific T-wave changes. Myocardial perfusion stress test earlier in the week was without a perfusion defect but notable for 3 times a day, which is nonspecific. The patient received aspirin 325 mg in the ED as well as a heparin infusion.  Proceed with urgent coronary angiography. I have reviewed the risks, indications, and alternatives to cardiac catheterization, possible angioplasty, and stenting with the patient. Risks include but are not limited to bleeding, infection, vascular injury, stroke, myocardial infection, arrhythmia, kidney  injury, radiation-related injury in the case of prolonged fluoroscopy use, emergency cardiac surgery, and death. The patient understands the risks of serious complication is 1-2 in 3790 with diagnostic cardiac cath and 1-2% or less with angioplasty/stenting.  Continue nitroglycerin infusion.  Start aspirin 81 mg daily; patient has a history of aspirin causing palpitations but at this time the benefits outweigh the risks.  Hypertension Blood pressure modestly elevated in the setting of her pain today.  Restart home indications following catheterization.  Diabetes mellitus  Continue linagliptin.  Sliding scale insulin while inpatient.  Disposition:  To be determined based on results of cardiac catheterization  Signed, Zian Delair MD 06/18/2016, 1:06 PM Pager: [phone]

## 2016-06-18 NOTE — ED Triage Notes (Signed)
Chest pressure since yesterday/  Worsening over night.  Pt took 2 sl ntg with some relief.  Rates pain 7/10 now.  Abnormal stress test this past Tuesday.  Scheduled heart cath for Monday.

## 2016-06-18 NOTE — Brief Op Note (Signed)
Brief Cardiac Catheterization Note (Full Report to Follow)  Date: 06/18/2016 Time: 1:55 PM  PATIENT:  Peggy French  75 y.o. female  PRE-OPERATIVE DIAGNOSIS: Unstable angina and abnormal stress test  POST-OPERATIVE DIAGNOSIS:  Non-obstructive coronary artery disease  PROCEDURE:  Procedure(s): Left Heart Cath and Coronary Angiography (N/A)  SURGEON:  Surgeon(s) and Role:    * Nelva Bush, MD - Primary  FINDINGS: 1. Mild to moderate, non-obstructive coronary artery disease. 2. Low normal left ventricular filling pressure (LVEDP 5 mmHg). 3. Normal left ventricular contraction (LVEF 60-65%).  RECOMMENDATIONS: 1. Discontinue nitroglycerin infusion and start isosorbide mononitrate 30 mg daily for possible component of microvascular disease. 2. IV hydration, given low LVEDP. 3. Observe overnight to ensure chest pain is well-controlled.  Nelva Bush, MD [facility] Pager: [phone]

## 2016-06-18 NOTE — ED Provider Notes (Signed)
[facility] DEPT Provider Note   CSN: [id number] Arrival date & time: 06/18/16  0718     History   Chief Complaint Chief Complaint  Patient presents with  . Chest Pain    HPI Peggy French is a 75 y.o. female.  HPI Patient presents to the emergency department with anterior chest pressure.  She reports she had some discomfort yesterday and then again to the middle night.  She presents now with worsening chest pressure which improved somewhat with 2 sublingual nitroglycerin.  She reports a recent abnormal stress test and schedule heart catheterization for Monday with Dr. Pernell Dupre.  She reports her pain is 6-7 out of 10 at this time.  She reports it as a pressure sensation in her anterior chest without significant radiation.  In the past she has had some radiation to her right arm and right shoulder with associated shortness of breath.  Currently she is without shoulder or arm pain.  Currently she reports no nausea shortness of breath or diaphoresis.  No prior heart catheterization.  Recent abnormal stress test.  She reports aspirin results in a pounding sensation in her ear.   Past Medical History:  Diagnosis Date  . Arthritis   . Asthmatic bronchitis 2011  . 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 s/p right hemicolectomy/chemo 2013 with mets to the lung s/p LLL wedge resection 2014.  . Diabetes mellitus    x over 10 yrs  . Family history of adverse reaction to anesthesia    FAMILY MEMBERS HAVE HAD N/V  . GERD (gastroesophageal reflux disease)   . Gout   . H/O hiatal hernia   . Headache(784.0)    hx migraines  . History of colon cancer   . History of shingles   . Hyperlipidemia   . Hypertension    EKG 10/12 EPIC, chest- 1 view  6/13 EPIC    Last 2D Echo on 05/02/2012 showed EF of greater than 55%  . Microcytic anemia   . Mild CAD    a. cath by Dr. Debara Pickett in 03/2011 - 20-30% ostial bifurcation stenosis of LAD, otherwise only mild luminal irregularities  in LAD/RCA, LVEF 60%, no RWMA.  . Neuropathy ([place])   . Obesity     Patient Active Problem List   Diagnosis Date Noted  . Coronary artery disease 05/27/2016  . Chest pain with moderate risk of acute coronary syndrome 06/17/2015  . Diabetes mellitus type 2 in obese ([place]) 06/17/2015  . Obesity   . 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 S/P right colectomy 01/07/2012  . 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 Gout   . Diabetes mellitus   . Hypertension   . Dyslipidemia     Past Surgical History:  Procedure Laterality Date  . ABDOMINAL HYSTERECTOMY    . APPENDECTOMY    . CARDIAC CATHETERIZATION  [phone]  . CATARACT EXTRACTION W/PHACO Left 02/17/2015   Procedure: CATARACT EXTRACTION PHACO AND INTRAOCULAR LENS PLACEMENT (IOC);  Surgeon: Tonny Branch, MD;  Location: [facility];  Service: Ophthalmology;  Laterality: Left;  CDE:8.01   . CHOLECYSTECTOMY    . COLON SURGERY     PARTIAL COLECTOMY 30 YRS / AGAIN 2013  . COLONOSCOPY  11/26/2011   Procedure: COLONOSCOPY;  Surgeon: Rogene Houston, MD;  Location: [facility];  Service: Endoscopy;  Laterality: N/A;  2:15  . COLONOSCOPY N/A 12/08/2012   Procedure: COLONOSCOPY;  Surgeon: Rogene Houston, MD;  Location: [facility];  Service: Endoscopy;  Laterality: N/A;  815-moved to 0950 Ann to notify pt  . COLONOSCOPY N/A 03/25/2016   Procedure: COLONOSCOPY;  Surgeon: Rogene Houston, MD;  Location: [facility];  Service: Endoscopy;  Laterality: N/A;  1200  . ESOPHAGOGASTRODUODENOSCOPY  06/16/2011   Procedure: ESOPHAGOGASTRODUODENOSCOPY (EGD);  Surgeon: Rogene Houston, MD;  Location: [facility];  Service: Endoscopy;  Laterality: N/A;  2:15  . PORT-A-CATH REMOVAL N/A 06/04/2014   Procedure: REMOVAL PORT-A-CATH;  Surgeon: Pedro Earls, MD;  Location: [facility];  Service: General;  Laterality: N/A;  . PORTACATH PLACEMENT  02/22/2012   Procedure: INSERTION PORT-A-CATH;  Surgeon: Pedro Earls, MD;  Location: [facility];   Service: General;  Laterality: N/A;  left subclavian  . VIDEO ASSISTED THORACOSCOPY (VATS)/WEDGE RESECTION Left 03/27/2013   Procedure: VIDEO ASSISTED THORACOSCOPY (VATS)/WEDGE RESECTION;  Surgeon: Grace Isaac, MD;  Location: [facility];  Service: Thoracic;  Laterality: Left;  Marland Kitchen VIDEO BRONCHOSCOPY N/A 03/27/2013   Procedure: VIDEO BRONCHOSCOPY;  Surgeon: Grace Isaac, MD;  Location: [facility] OR;  Service: Thoracic;  Laterality: N/A;    OB History    No data available       Home Medications    Prior to Admission medications   Medication Sig Start Date End Date Taking? Authorizing Provider  allopurinol (ZYLOPRIM) 100 MG tablet Take 100 mg by mouth daily. 04/28/16  Yes Historical Provider, MD  amLODipine (NORVASC) 5 MG tablet Take 5 mg by mouth daily as needed. Only take when BP readings are high   Yes Historical Provider, MD  Cyanocobalamin (B-12 PO) Take 1 tablet by mouth daily.   Yes Historical Provider, MD  esomeprazole (NEXIUM) 40 MG capsule Take 40 mg by mouth every evening. 07/16/11  Yes Rogene Houston, MD  fexofenadine (ALLEGRA) 180 MG tablet Take 180 mg by mouth daily. For Allergies   Yes Historical Provider, MD  gabapentin (NEURONTIN) 100 MG capsule Take 100 mg by mouth at bedtime.    Yes Historical Provider, MD  KLOR-CON M20 20 MEQ tablet TAKE 1 TABLET (20 MEQ TOTAL) BY MOUTH DAILY. 05/12/16  Yes Owens Shark, NP  linagliptin (TRADJENTA) 5 MG TABS tablet Take 5 mg by mouth daily after breakfast.    Yes Historical Provider, MD  Melatonin 5 MG TABS Take 5 mg by mouth daily.   Yes Historical Provider, MD  nitroGLYCERIN (NITROSTAT) 0.4 MG SL tablet Place 1 tablet (0.4 mg total) under the tongue every 5 (five) minutes as needed for chest pain. 05/27/16 08/25/16 Yes Luke K Kilroy, PA-C  propranolol-hydrochlorothiazide (INDERIDE) 40-25 MG per tablet Take 1 tablet by mouth daily with breakfast. 04/06/13  Yes Donielle Liston Alba, PA-C  ramipril (ALTACE) 5 MG capsule Take 5 mg by mouth daily  with breakfast.    Yes Historical Provider, MD  simvastatin (ZOCOR) 40 MG tablet Take 40 mg by mouth at bedtime.    Yes Historical Provider, MD  vitamin C (ASCORBIC ACID) 500 MG tablet Take 500 mg by mouth daily.   Yes Historical Provider, MD  acetaminophen (TYLENOL) 500 MG tablet Take 1,000 mg by mouth every 6 (six) hours as needed for mild pain or moderate pain. For pain    Historical Provider, MD  aspirin EC 81 MG tablet Take 1 tablet (81 mg total) by mouth daily. Patient not taking: Reported on 06/17/2016 06/17/15   Jonetta Osgood, MD  carboxymethylcellulose (REFRESH PLUS) 0.5 % SOLN Place 2 drops into both eyes 3 (three) times  daily as needed (dry eyes).     Historical Provider, MD  diphenoxylate-atropine (LOMOTIL) 2.5-0.025 MG tablet Take 1 tablet by mouth 4 (four) times daily as needed for diarrhea or loose stools.    Historical Provider, MD    Family History Family History  Problem Relation Age of Onset  . Cancer Maternal Aunt     throat,     Social History Social History  Substance Use Topics  . Smoking status: Never Smoker  . Smokeless tobacco: Never Used  . Alcohol use No     Allergies   Aspirin   Review of Systems Review of Systems  All other systems reviewed and are negative.    Physical Exam Updated Vital Signs BP 128/86   Pulse 82   Temp 98.3 F (36.8 C) (Oral)   Resp 14   Ht '5\' 4"'$  (1.626 m)   Wt 217 lb (98.4 kg)   SpO2 98%   BMI 37.25 kg/m   Physical Exam  Constitutional: She is oriented to person, place, and time. She appears well-developed and well-nourished. No distress.  HENT:  Head: Normocephalic and atraumatic.  Eyes: EOM are normal.  Neck: Normal range of motion.  Cardiovascular: Normal rate, regular rhythm and normal heart sounds.   Pulmonary/Chest: Effort normal and breath sounds normal.  Abdominal: Soft. She exhibits no distension. There is no tenderness.  Musculoskeletal: Normal range of motion.  Neurological: She is alert and  oriented to person, place, and time.  Skin: Skin is warm and dry.  Psychiatric: She has a normal mood and affect. Judgment normal.  Nursing note and vitals reviewed.    ED Treatments / Results  Labs (all labs ordered are listed, but only abnormal results are displayed) Labs Reviewed  BASIC METABOLIC PANEL - Abnormal; Notable for the following:       Result Value   Glucose, Bld 165 (*)    Creatinine, Ser 1.02 (*)    GFR calc non Af Amer 53 (*)    All other components within normal limits  APTT - Abnormal; Notable for the following:    aPTT 38 (*)    All other components within normal limits  CBC  TROPONIN I  PROTIME-INR  HEPARIN LEVEL (UNFRACTIONATED)    EKG  EKG Interpretation  Date/Time:  Friday June 18 2016 07:30:32 EST Ventricular Rate:  103 PR Interval:    QRS Duration: 77 QT Interval:  322 QTC Calculation: 422 R Axis:   62 Text Interpretation:  Sinus tachycardia Low voltage, precordial leads Borderline T abnormalities, diffuse leads Baseline wander in lead(s) I No significant change was found Confirmed by Patricia Perales  MD, Lennette Bihari ([id number]) on 06/18/2016 7:46:23 AM       Radiology Dg Chest 2 View  Result Date: 06/18/2016 CLINICAL DATA:  Worsening central chest pressure and shortness of breath since yesterday. EXAM: CHEST  2 VIEW COMPARISON:  03/27/2016 FINDINGS: Lung markings are slightly prominent but minimally changed. Heart size is within normal limits. The trachea is midline. Degenerative changes in the lower cervical spine and throughout the thoracic spine. Lungs are clear without focal airspace disease or pulmonary edema. No large pleural effusions. IMPRESSION: No active cardiopulmonary disease. Electronically Signed   By: Markus Daft M.D.   On: 06/18/2016 08:26    Procedures Procedures (including critical care time)   ++++++++++++++++++++++++++++++++++++++++++++++++++++++++++++++  CRITICAL CARE Performed by: Hoy Morn Total critical care time: 31  minutes Critical care time was exclusive of separately billable procedures and treating other patients. Critical care was  necessary to treat or prevent imminent or life-threatening deterioration. Critical care was time spent personally by me on the following activities: development of treatment plan with patient and/or surrogate as well as nursing, discussions with consultants, evaluation of patient's response to treatment, examination of patient, obtaining history from patient or surrogate, ordering and performing treatments and interventions, ordering and review of laboratory studies, ordering and review of radiographic studies, pulse oximetry and re-evaluation of patient's condition.  +++++++++++++++++++++++++++++++++++++++++++++++++++++++++++++++++   Medications Ordered in ED Medications  heparin ADULT infusion 100 units/mL (25000 units/257m sodium chloride 0.45%) (1,000 Units/hr Intravenous New Bag/Given 06/18/16 0856)  aspirin tablet 325 mg (not administered)  nitroGLYCERIN (NITROSTAT) SL tablet 0.4 mg (0.4 mg Sublingual Given 06/18/16 0737)  heparin bolus via infusion 4,000 Units (4,000 Units Intravenous Bolus from Bag 06/18/16 0857)     Initial Impression / Assessment and Plan / ED Course  I have reviewed the triage vital signs and the nursing notes.  Pertinent labs & imaging results that were available during my care of the patient were reviewed by me and considered in my medical decision making (see chart for details).  Clinical Course     Symptoms concerning for unstable angina especially in the setting of recent abnormal stress test and schedule heart catheterization in 48 hours.  Patient be started on heparin at this time.  [facility]  Patient be placed on telemetry at this time.  Her pain seems to be improving in the emergency department.  Her EKG is without acute ischemic changes.  Her initial troponin is negative.   She has aspirin intolerance that she reports that it causes a pounding sensation.  This is true allergy.  Will give her dose aspirin here and see if she tolerates it.  Final Clinical Impressions(s) / ED Diagnoses   Final diagnoses:  None    New Prescriptions New Prescriptions   No medications on file     KJola Schmidt MD 06/18/16 [phone]

## 2016-06-18 NOTE — ED Notes (Addendum)
Pt reports taking 2 sl nitro pta. CP 6/10 at present. Pt states she does not take Asprin because it causes palpitations.

## 2016-06-19 ENCOUNTER — Encounter (HOSPITAL_COMMUNITY): Payer: Self-pay | Admitting: Physician Assistant

## 2016-06-19 DIAGNOSIS — I200 Unstable angina: Secondary | ICD-10-CM | POA: Diagnosis not present

## 2016-06-19 DIAGNOSIS — R0789 Other chest pain: Secondary | ICD-10-CM

## 2016-06-19 DIAGNOSIS — I2510 Atherosclerotic heart disease of native coronary artery without angina pectoris: Secondary | ICD-10-CM | POA: Diagnosis not present

## 2016-06-19 LAB — CBC
HCT: 33 % — ABNORMAL LOW (ref 36.0–46.0)
Hemoglobin: 10.8 g/dL — ABNORMAL LOW (ref 12.0–15.0)
MCH: 26.2 pg (ref 26.0–34.0)
MCHC: 32.7 g/dL (ref 30.0–36.0)
MCV: 79.9 fL (ref 78.0–100.0)
PLATELETS: 203 10*3/uL (ref 150–400)
RBC: 4.13 MIL/uL (ref 3.87–5.11)
RDW: 14.1 % (ref 11.5–15.5)
WBC: 6.5 10*3/uL (ref 4.0–10.5)

## 2016-06-19 LAB — GLUCOSE, CAPILLARY
Glucose-Capillary: 131 mg/dL — ABNORMAL HIGH (ref 65–99)
Glucose-Capillary: 149 mg/dL — ABNORMAL HIGH (ref 65–99)

## 2016-06-19 MED ORDER — ISOSORBIDE MONONITRATE ER 30 MG PO TB24: 30.0000 mg | ORAL_TABLET | Freq: Every day | ORAL | 6 refills | Status: DC

## 2016-06-19 NOTE — Care Management Obs Status (Signed)
[facility] NOTIFICATION   Patient Details  Name: Peggy French MRN: [id number] Date of Birth: 1940/09/25   Medicare Observation Status Notification Given:  Yes    Dellie Catholic, RN 06/19/2016, 11:33 AM

## 2016-06-19 NOTE — Discharge Summary (Signed)
Discharge Summary    Patient ID: Peggy French,  MRN: [id number], DOB/AGE: October 11, 1940 75 y.o.  Admit date: 06/18/2016 Discharge date: 06/19/2016  Primary Care Provider: Maggie Font Primary Cardiologist: Dr. 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 Aspirin Palpitations     History of Present Illness     [name] is a 75 year old woman with history of diabetes mellitus, hypertension, hyperlipidemia, colon cancer with lung metastases status post resection and chemotherapy, and recurrent chest pain who presented to [facility] on 06/18/16 with chest pain. She was transferred to [facility] for coronary angiography.   She was seen in the office by [name] on 05/27/16 for intermittent chest pressure. She was subsequently referred for myocardial perfusion stress test, which was performed earlier this week. This did not show any areas of ischemia or infarct but was notable for TID (TID ratio 1.52). Based on these findings, she was referred for cardiac catheterization, to be done next week. However, the morning of 06/18/16, she developed severe chest pressure with accompanying shortness of breath and nausea. She took sublingual nitroglycerin without complete relief, prompting her to go to the [facility] emergency department. There, she received additional nitroglycerin with improvement in her chest pain. She was then started on a nitroglycerin infusion with relief. She notes that the pain is similar to what she has experienced in the past, including when she has undergone catheterization (2001 and 2011).  With ongoing chest pain and recent abnormal stress testing she was transferred to [facility] for coronary  angiography.    Hospital Course     Consultants: none   Chest pain: cath yesterday showed mild to moderate nonobstructive CAD. LVEDP 5, normal LVEF 60-65%. Started on nitrates for possible microvascular disease. Continue risk factor modification and medical therapy with ASA, statin and BB. Follow up in office in 2-3 weeks.   HTN: BP well controlled currently  DMT2: continue home regimen   The patient has had an uncomplicated hospital course and is recovering well. The radial catheter site is stable. She has been seen by Dr. Harl Bowie today and deemed ready for discharge home. All follow-up appointments have been scheduled. Discharge medications are listed below.  _____________  Discharge Vitals Blood pressure 134/71, pulse 99, temperature 98.9 F (37.2 C), temperature source Oral, resp. rate 14, height '5\' 4"'$  (1.626 m), weight 219 lb 12.8 oz (99.7 kg), SpO2 97 %.  Filed Weights   06/18/16 0727 06/19/16 0449  Weight: 217 lb (98.4 kg) 219 lb 12.8 oz (99.7 kg)    Labs & Radiologic Studies     CBC  Recent Labs  06/18/16 1843 06/19/16 0435  WBC 5.2 6.5  HGB 11.6* 10.8*  HCT 35.4* 33.0*  MCV 80.5 79.9  PLT 202 259   Basic Metabolic Panel  Recent Labs  06/18/16 0739 06/18/16 1843  NA 138  --   K 3.5  --   CL 101  --   CO2 28  --   GLUCOSE 165*  --   BUN 14  --   CREATININE 1.02* 0.90  CALCIUM 9.0  --    Liver Function Tests No results for  input(s): AST, ALT, ALKPHOS, BILITOT, PROT, ALBUMIN in the last 72 hours. No results for input(s): LIPASE, AMYLASE in the last 72 hours. Cardiac Enzymes  Recent Labs  06/18/16 0739  TROPONINI <0.03   BNP Invalid input(s): POCBNP D-Dimer No results for input(s): DDIMER in the last 72 hours. Hemoglobin A1C No results for input(s): HGBA1C in the last 72 hours. Fasting Lipid Panel No results for input(s): CHOL, HDL, LDLCALC, TRIG, CHOLHDL, LDLDIRECT in the last 72 hours. Thyroid Function Tests No results for input(s):  TSH, T4TOTAL, T3FREE, THYROIDAB in the last 72 hours.  Invalid input(s): FREET3  Dg Chest 2 View  Result Date: 06/18/2016 CLINICAL DATA:  Worsening central chest pressure and shortness of breath since yesterday. EXAM: CHEST  2 VIEW COMPARISON:  03/27/2016 FINDINGS: Lung markings are slightly prominent but minimally changed. Heart size is within normal limits. The trachea is midline. Degenerative changes in the lower cervical spine and throughout the thoracic spine. Lungs are clear without focal airspace disease or pulmonary edema. No large pleural effusions. IMPRESSION: No active cardiopulmonary disease. Electronically Signed   By: Markus Daft M.D.   On: 06/18/2016 08:26   Mm Screening Breast Tomo Bilateral  Result Date: 06/05/2016 CLINICAL DATA:  Screening. EXAM: 2D DIGITAL SCREENING BILATERAL MAMMOGRAM WITH CAD AND ADJUNCT TOMO COMPARISON:  Previous exam(s). ACR Breast Density Category b: There are scattered areas of fibroglandular density. FINDINGS: There are no findings suspicious for malignancy. Images were processed with CAD. IMPRESSION: No mammographic evidence of malignancy. A result letter of this screening mammogram will be mailed directly to the patient. RECOMMENDATION: Screening mammogram in one year. (Code:[id number]-B-01Y) BI-RADS CATEGORY  1: Negative. Electronically Signed   By: Ammie Ferrier M.D.   On: 06/05/2016 12:19     Diagnostic Studies/Procedures    06/18/16 Left Heart Cath and Coronary Angiography  Conclusion   Conclusions: 1. Mild to moderate non-obstructive coronary artery disease of up to 30% involving the LAD, LCx, and RCA. 2. Low normal left ventricular filling pressure (LVEDP 5 mmHg). 3. Normal left ventricular contraction (LVEF 60-65%).  Recommendations: 1. Discontinue nitroglycerin infusion and start isosorbide mononitrate 30 mg daily for possible microvascular disease. Continue amlodipine and propranolol. 2. IV hydration given low normal LVEDP. 3. Continue  risk factor modification, including statin therapy.     _____________    Disposition   Pt is being discharged home today in good condition.  Follow-up Plans & Appointments    Follow-up Information    Pixie Casino, MD Follow up.   Specialty:  Cardiology Why:  The office will call you to arrange a follow up in 2-3 weeks Contact information: [place] [address] [phone]            Discharge Medications     Medication List    TAKE these medications   acetaminophen 500 MG tablet Commonly known as:  TYLENOL Take 1,000 mg by mouth every 6 (six) hours as needed for mild pain or moderate pain. For pain   allopurinol 100 MG tablet Commonly known as:  ZYLOPRIM Take 100 mg by mouth daily.   amLODipine 5 MG tablet Commonly known as:  NORVASC Take 5 mg by mouth daily as needed. Only take when BP readings are high   aspirin EC 81 MG tablet Take 1 tablet (81 mg total) by mouth daily.   B-12 PO Take 1 tablet by mouth daily.   carboxymethylcellulose 0.5 % Soln Commonly known as:  REFRESH PLUS Place 2 drops into  both eyes 3 (three) times daily as needed (dry eyes).   diphenoxylate-atropine 2.5-0.025 MG tablet Commonly known as:  LOMOTIL Take 1 tablet by mouth 4 (four) times daily as needed for diarrhea or loose stools.   esomeprazole 40 MG capsule Commonly known as:  NEXIUM Take 40 mg by mouth every evening.   fexofenadine 180 MG tablet Commonly known as:  ALLEGRA Take 180 mg by mouth daily. For Allergies   gabapentin 100 MG capsule Commonly known as:  NEURONTIN Take 100 mg by mouth at bedtime.   isosorbide mononitrate 30 MG 24 hr tablet Commonly known as:  IMDUR Take 1 tablet (30 mg total) by mouth daily. Start taking on:  06/20/2016   KLOR-CON M20 20 MEQ tablet Generic drug:  potassium chloride SA TAKE 1 TABLET (20 MEQ TOTAL) BY MOUTH DAILY.   linagliptin 5 MG Tabs tablet Commonly known as:  TRADJENTA Take 5 mg  by mouth daily after breakfast.   Melatonin 5 MG Tabs Take 5 mg by mouth daily.   nitroGLYCERIN 0.4 MG SL tablet Commonly known as:  NITROSTAT Place 1 tablet (0.4 mg total) under the tongue every 5 (five) minutes as needed for chest pain.   propranolol-hydrochlorothiazide 40-25 MG tablet Commonly known as:  INDERIDE Take 1 tablet by mouth daily with breakfast.   ramipril 5 MG capsule Commonly known as:  ALTACE Take 5 mg by mouth daily with breakfast.   simvastatin 40 MG tablet Commonly known as:  ZOCOR Take 40 mg by mouth at bedtime.   vitamin C 500 MG tablet Commonly known as:  ASCORBIC ACID Take 500 mg by mouth daily.         Outstanding Labs/Studies   none  Duration of Discharge Encounter   Greater than 30 minutes including physician time.  Signed, Angelena Form PA-C 06/19/2016, 10:18 AM

## 2016-06-19 NOTE — Progress Notes (Signed)
Subjective:    No complaints this AM  Objective:   Temp:  [97.2 F (36.2 C)-98.9 F (37.2 C)] 98.9 F (37.2 C) (12/09 0449) Pulse Rate:  [0-104] 99 (12/09 0449) Resp:  [0-23] 14 (12/09 0449) BP: (114-155)/(61-95) 134/71 (12/09 0449) SpO2:  [0 %-100 %] 97 % (12/09 0449) Weight:  [219 lb 12.8 oz (99.7 kg)] 219 lb 12.8 oz (99.7 kg) (12/09 0449) Last BM Date: 06/18/16  Filed Weights   06/18/16 0727 06/19/16 0449  Weight: 217 lb (98.4 kg) 219 lb 12.8 oz (99.7 kg)    Intake/Output Summary (Last 24 hours) at 06/19/16 0926 Last data filed at 06/19/16 0600  Gross per 24 hour  Intake           322.92 ml  Output                0 ml  Net           322.92 ml    Telemetry: SR and sinus tach  Exam:  General: NAD  HEENT: sclera clear, throat clear  Resp: CTAB  Cardiac: RRR, no m/r/g, no jvd  GI: abdomen soft, NT, Nd  MSK:no LE edema  Neuro: no focal defiits  Psych: appropriate affect  Lab Results:  Basic Metabolic Panel:  Recent Labs Lab 06/18/16 0739 06/18/16 1843  NA 138  --   K 3.5  --   CL 101  --   CO2 28  --   GLUCOSE 165*  --   BUN 14  --   CREATININE 1.02* 0.90  CALCIUM 9.0  --     Liver Function Tests: No results for input(s): AST, ALT, ALKPHOS, BILITOT, PROT, ALBUMIN in the last 168 hours.  CBC:  Recent Labs Lab 06/18/16 0739 06/18/16 1843 06/19/16 0435  WBC 6.1 5.2 6.5  HGB 12.5 11.6* 10.8*  HCT 38.1 35.4* 33.0*  MCV 82.3 80.5 79.9  PLT 207 202 203    Cardiac Enzymes:  Recent Labs Lab 06/18/16 0739  TROPONINI <0.03    BNP: No results for input(s): PROBNP in the last 8760 hours.  Coagulation:  Recent Labs Lab 06/18/16 0736  INR 1.03    ECG:   Medications:   Scheduled Medications: . allopurinol  100 mg Oral Daily  . amLODipine  5 mg Oral Daily  . aspirin  81 mg Oral Daily  . gabapentin  100 mg Oral QHS  . heparin  5,000 Units Subcutaneous Q8H  . propranolol  40 mg Oral Q breakfast   And  .  hydrochlorothiazide  25 mg Oral Q breakfast  . insulin aspart  0-15 Units Subcutaneous TID WC  . isosorbide mononitrate  30 mg Oral Daily  . linagliptin  5 mg Oral QPC breakfast  . loratadine  10 mg Oral Daily  . pantoprazole  40 mg Oral Daily  . ramipril  5 mg Oral Q breakfast  . simvastatin  40 mg Oral QHS  . sodium chloride flush  3 mL Intravenous Q12H     Infusions:   PRN Medications:  sodium chloride, acetaminophen, diphenoxylate-atropine, nitroGLYCERIN, ondansetron (ZOFRAN) IV, polyvinyl alcohol, sodium chloride flush     Assessment/Plan    1. Chest pain - recent abnormal stress test, referred for cath - cath yesterday showed mild to moderate nonobstructive CAD. LVEDP 5, normal LVEF 60-65% - started on nitrates for possible microvascular disease - no chest pain overnight.     Plan for discharge today, will need outpatient f/u 2-3 weeks.  Carlyle Dolly, M.D., F.A.C.C.Patient ID: Peggy French, female   DOB: 1941/05/04, 75 y.o.   MRN: [id number]

## 2016-06-19 NOTE — Discharge Instructions (Signed)
Radial Site Care Refer to this sheet in the next few weeks. These instructions provide you with information on caring for yourself after your procedure. Your caregiver may also give you more specific instructions. Your treatment has been planned according to current medical practices, but problems sometimes occur. Call your caregiver if you have any problems or questions after your procedure. HOME CARE INSTRUCTIONS  You may shower the day after the procedure.Remove the bandage (dressing) and gently wash the site with plain soap and water.Gently pat the site dry.   Do not apply powder or lotion to the site.   Do not submerge the affected site in water for 3 to 5 days.   Inspect the site at least twice daily.   Do not flex or bend the affected arm for 24 hours.   No lifting over 5 pounds (2.3 kg) for 5 days after your procedure.   Do not drive home if you are discharged the same day of the procedure. Have someone else drive you.   You may drive 24 hours after the procedure unless otherwise instructed by your caregiver.  What to expect:  Any bruising will usually fade within 1 to 2 weeks.   Blood that collects in the tissue (hematoma) may be painful to the touch. It should usually decrease in size and tenderness within 1 to 2 weeks.  SEEK IMMEDIATE MEDICAL CARE IF:  You have unusual pain at the radial site.   You have redness, warmth, swelling, or pain at the radial site.   You have drainage (other than a small amount of blood on the dressing).   You have chills.   You have a fever or persistent symptoms for more than 72 hours.   You have a fever and your symptoms suddenly get worse.   Your arm becomes pale, cool, tingly, or numb.   You have heavy bleeding from the site. Hold pressure on the site.    Heart-Healthy Eating Plan Many factors influence your heart health, including eating and exercise habits. Heart (coronary) risk increases with abnormal blood fat (lipid)  levels. Heart-healthy meal planning includes limiting unhealthy fats, increasing healthy fats, and making other small dietary changes. This includes maintaining a healthy body weight to help keep lipid levels within a normal range. What is my plan? Your health care provider recommends that you:  Get no more than _________% of the total calories in your daily diet from fat.  Limit your intake of saturated fat to less than _________% of your total calories each day.  Limit the amount of cholesterol in your diet to less than _________ mg per day. What types of fat should I choose?  Choose healthy fats more often. Choose monounsaturated and polyunsaturated fats, such as olive oil and canola oil, flaxseeds, walnuts, almonds, and seeds.  Eat more omega-3 fats. Good choices include salmon, mackerel, sardines, tuna, flaxseed oil, and ground flaxseeds. Aim to eat fish at least two times each week.  Limit saturated fats. Saturated fats are primarily found in animal products, such as meats, butter, and cream. Plant sources of saturated fats include palm oil, palm kernel oil, and coconut oil.  Avoid foods with partially hydrogenated oils in them. These contain trans fats. Examples of foods that contain trans fats are stick margarine, some tub margarines, cookies, crackers, and other baked goods. What general guidelines do I need to follow?  Check food labels carefully to identify foods with trans fats or high amounts of saturated fat.  Fill one  half of your plate with vegetables and green salads. Eat 4-5 servings of vegetables per day. A serving of vegetables equals 1 cup of raw leafy vegetables,  cup of raw or cooked cut-up vegetables, or  cup of vegetable juice.  Fill one fourth of your plate with whole grains. Look for the word "whole" as the first word in the ingredient list.  Fill one fourth of your plate with lean protein foods.  Eat 4-5 servings of fruit per day. A serving of fruit equals  one medium whole fruit,  cup of dried fruit,  cup of fresh, frozen, or canned fruit, or  cup of 100% fruit juice.  Eat more foods that contain soluble fiber. Examples of foods that contain this type of fiber are apples, broccoli, carrots, beans, peas, and barley. Aim to get 20-30 g of fiber per day.  Eat more home-cooked food and less restaurant, buffet, and fast food.  Limit or avoid alcohol.  Limit foods that are high in starch and sugar.  Avoid fried foods.  Cook foods by using methods other than frying. Baking, boiling, grilling, and broiling are all great options. Other fat-reducing suggestions include:  Removing the skin from poultry.  Removing all visible fats from meats.  Skimming the fat off of stews, soups, and gravies before serving them.  Steaming vegetables in water or broth.  Lose weight if you are overweight. Losing just 5-10% of your initial body weight can help your overall health and prevent diseases such as diabetes and heart disease.  Increase your consumption of nuts, legumes, and seeds to 4-5 servings per week. One serving of dried beans or legumes equals  cup after being cooked, one serving of nuts equals 1 ounces, and one serving of seeds equals  ounce or 1 tablespoon.  You may need to monitor your salt (sodium) intake, especially if you have high blood pressure. Talk with your health care provider or dietitian to get more information about reducing sodium. What foods can I eat? Grains  Breads, including [place], white, pita, wheat, raisin, rye, oatmeal, and [place]. Tortillas that are neither fried nor made with lard or trans fat. Low-fat rolls, including hotdog and hamburger buns and English muffins. Biscuits. Muffins. Waffles. Pancakes. Light popcorn. Whole-grain cereals. Flatbread. Melba toast. Pretzels. Breadsticks. Rusks. Low-fat snacks and crackers, including oyster, saltine, matzo, graham, animal, and rye. Rice and pasta, including brown rice and  those that are made with whole wheat. Vegetables  All vegetables. Fruits  All fruits, but limit coconut. Meats and Other Protein Sources  Lean, well-trimmed beef, veal, pork, and lamb. Chicken and [place] without skin. All fish and shellfish. Wild duck, rabbit, pheasant, and venison. Egg whites or low-cholesterol egg substitutes. Dried beans, peas, lentils, and tofu.Seeds and most nuts. Dairy  Low-fat or nonfat cheeses, including ricotta, string, and mozzarella. Skim or 1% milk that is liquid, powdered, or evaporated. Buttermilk that is made with low-fat milk. Nonfat or low-fat yogurt. Beverages  Mineral water. Diet carbonated beverages. Sweets and Desserts  Sherbets and fruit ices. Honey, jam, marmalade, jelly, and syrups. Meringues and gelatins. Pure sugar candy, such as hard candy, jelly beans, gumdrops, mints, marshmallows, and small amounts of dark chocolate. [employer] Eat all sweets and desserts in moderation. Fats and Oils  Nonhydrogenated (trans-free) margarines. Vegetable oils, including soybean, sesame, sunflower, olive, peanut, safflower, corn, canola, and cottonseed. Salad dressings or mayonnaise that are made with a vegetable oil. Limit added fats and oils that you use for cooking, baking,  salads, and as spreads. Other  Cocoa powder. Coffee and tea. All seasonings and condiments. The items listed above may not be a complete list of recommended foods or beverages. Contact your dietitian for more options.  What foods are not recommended? Grains  Breads that are made with saturated or trans fats, oils, or whole milk. Croissants. Butter rolls. Cheese breads. Sweet rolls. Donuts. Buttered popcorn. Chow mein noodles. High-fat crackers, such as cheese or butter crackers. Meats and Other Protein Sources  Fatty meats, such as hotdogs, short ribs, sausage, spareribs, bacon, ribeye roast or steak, and mutton. High-fat deli meats, such as salami and bologna. Caviar. Domestic duck and  goose. Organ meats, such as kidney, liver, sweetbreads, brains, gizzard, chitterlings, and heart. Dairy  Cream, sour cream, cream cheese, and creamed cottage cheese. Whole milk cheeses, including blue (bleu), Monterey Jack, [place], [place], American, [place], Swiss, [place], [place], and [place]. Whole or 2% milk that is liquid, evaporated, or condensed. Whole buttermilk. Cream sauce or high-fat cheese sauce. Yogurt that is made from whole milk. Beverages  Regular sodas and drinks with added sugar. Sweets and Desserts  Frosting. Pudding. Cookies. Cakes other than angel food cake. Candy that has milk chocolate or white chocolate, hydrogenated fat, butter, coconut, or unknown ingredients. Buttered syrups. Full-fat ice cream or ice cream drinks. Fats and Oils  Gravy that has suet, meat fat, or shortening. Cocoa butter, hydrogenated oils, palm oil, coconut oil, palm kernel oil. These can often be found in baked products, candy, fried foods, nondairy creamers, and whipped toppings. Solid fats and shortenings, including bacon fat, salt pork, lard, and butter. Nondairy cream substitutes, such as coffee creamers and sour cream substitutes. Salad dressings that are made of unknown oils, cheese, or sour cream. The items listed above may not be a complete list of foods and beverages to avoid. Contact your dietitian for more information.  This information is not intended to replace advice given to you by your health care provider. Make sure you discuss any questions you have with your health care provider. Document Released: 04/06/2008 Document Revised: 01/16/2016 Document Reviewed: 12/20/2013 Elsevier Interactive Patient Education  2017 [employer].

## 2016-06-21 ENCOUNTER — Telehealth: Payer: Self-pay | Admitting: Internal Medicine

## 2016-06-21 ENCOUNTER — Encounter (HOSPITAL_COMMUNITY): Payer: Self-pay | Admitting: Internal Medicine

## 2016-06-21 LAB — GLUCOSE, CAPILLARY: GLUCOSE-CAPILLARY: 108 mg/dL — AB (ref 65–99)

## 2016-06-21 NOTE — Telephone Encounter (Signed)
Have her keep the appt with me on Thursday, thanks.  Dr. Lemmie Evens

## 2016-06-21 NOTE — Telephone Encounter (Signed)
Pt was discharged on Friday from [place] a Cath. Should she keep her appt on Thursday or do a follow up visit in  2 to weeks for hospital visit?

## 2016-06-21 NOTE — Telephone Encounter (Signed)
Patient in to hospital over weekend. She originally had a cath scheduled today, looks like she went to ED for symptoms on Friday. She'd had an appt already scheduled w Dr. Debara Pickett for this Thursday. Wants to know if she should keep or do the 2-3 week follow up as indicated in hospital notes. Will seek Dr. Lysbeth Penner guidance.

## 2016-06-21 NOTE — Telephone Encounter (Signed)
Spoke to patient, aware of recommendations and voiced thanks.

## 2016-06-21 NOTE — Telephone Encounter (Signed)
Line busy when dialed. 

## 2016-06-24 ENCOUNTER — Encounter: Payer: Self-pay | Admitting: Internal Medicine

## 2016-06-24 ENCOUNTER — Ambulatory Visit (INDEPENDENT_AMBULATORY_CARE_PROVIDER_SITE_OTHER): Payer: Medicare Other | Admitting: Internal Medicine

## 2016-06-24 VITALS — BP 139/82 | HR 103 | Ht 64.0 in | Wt 216.2 lb

## 2016-06-24 DIAGNOSIS — E782 Mixed hyperlipidemia: Secondary | ICD-10-CM

## 2016-06-24 DIAGNOSIS — E1169 Type 2 diabetes mellitus with other specified complication: Secondary | ICD-10-CM

## 2016-06-24 DIAGNOSIS — R079 Chest pain, unspecified: Secondary | ICD-10-CM

## 2016-06-24 DIAGNOSIS — E669 Obesity, unspecified: Secondary | ICD-10-CM

## 2016-06-24 DIAGNOSIS — I10 Essential (primary) hypertension: Secondary | ICD-10-CM | POA: Diagnosis not present

## 2016-06-24 MED ORDER — CARVEDILOL 3.125 MG PO TABS: 3.1250 mg | ORAL_TABLET | Freq: Two times a day (BID) | ORAL | 3 refills | Status: DC

## 2016-06-24 MED ORDER — HYDROCHLOROTHIAZIDE 25 MG PO TABS
25.0000 mg | ORAL_TABLET | Freq: Every day | ORAL | 3 refills | Status: DC
Start: 2016-06-24 — End: 2016-09-28

## 2016-06-24 NOTE — Patient Instructions (Addendum)
Your physician has recommended you make the following change in your medication..  1. STOP propranolol-hydrochlorothiazide (inderide)  2. START carvedilol (coreg) 3.'125mg'$  twice daily 3. START hydrochlorothiaizde '25mg'$  once daily  Your physician recommends that you schedule a follow-up appointment in [place] with Dr. Debara Pickett

## 2016-06-24 NOTE — Progress Notes (Signed)
06/24/2016 Peggy French   Sep 15, 1940  [id number]  Primary Physician Peggy Font, MD Primary Cardiologist: Peggy French  CC: Follow-up catheterization  HPI:  Pleasant 75 y/o obese AA female with a history of minor CAD by cath in 2001 and 2012. She has had chest pain and was seen in the ED in Sept 2016 and Myoview, chest CTA, and echo in Dec 2016 were unremarkable then. She was seen again in the ED Sept 2017 and ruled out for an MI. The pt says she had chest pain over the weekend. She did not go to the hospital.  She saw Peggy French yesterday and he reassured her her EKG looked OK but wanted her seen here. The pt describes mid sternal "heaviness and tightness" that started Friday PM and lasted all weekend. Friday she had associated nausea, vomiting, and "sweating". Her pain would come and go, not obviously worsened with activity. She did not take NTG. She is on daily Nexium and is s/p remote cholecystectomy. Other medical problems include HTN, HLD, NIDDM, obesity, GERD, colon cancer s/p colectomy, and lung cancer s/p wedge resection.   06/24/2016  Peggy French returns today for follow-up of her heart catheterization. I have not seen her in a number of years. She recently saw Peggy Ransom, PA-C, who recommended a stress test for some symptoms concerning for angina. That stress test was negative for ischemia however, there was an abnormal TID ratio. I was planning on arranging for her catheterization however she had some more chest pain symptoms and presented to [facility]. Her pain was relieved with nitroglycerin and she was referred for cardiac catheterization. This was performed on 06/18/2016 by Peggy French, which demonstrated up to 30% mild to moderate nonobstructive coronary disease. LVEDP was 5 mmHg, LV EF was 60-65%. It was recommended that she go on to a long-acting nitrate for possible small vessel coronary disease. She says that she's been taking this and still is required a couple extra  sublingual nitroglycerin. She's been under significant stress. Her son was killed a number of years ago and had another son who died recently. Think this is playing a big role in things.   Current Outpatient Prescriptions  Medication Sig Dispense Refill  . acetaminophen (TYLENOL) 500 MG tablet Take 1,000 mg by mouth every 6 (six) hours as needed for mild pain or moderate pain. For pain    . allopurinol (ZYLOPRIM) 100 MG tablet Take 100 mg by mouth daily.  1  . amLODipine (NORVASC) 5 MG tablet Take 5 mg by mouth daily as needed. Only take when BP readings are high    . aspirin EC 81 MG tablet Take 1 tablet (81 mg total) by mouth daily.    . carboxymethylcellulose (REFRESH PLUS) 0.5 % SOLN Place 2 drops into both eyes 3 (three) times daily as needed (dry eyes).     . Cyanocobalamin (B-12 PO) Take 1 tablet by mouth daily.    . diphenoxylate-atropine (LOMOTIL) 2.5-0.025 MG tablet Take 1 tablet by mouth 4 (four) times daily as needed for diarrhea or loose stools.    Marland Kitchen esomeprazole (NEXIUM) 40 MG capsule Take 40 mg by mouth every evening.    . fexofenadine (ALLEGRA) 180 MG tablet Take 180 mg by mouth daily. For Allergies    . gabapentin (NEURONTIN) 100 MG capsule Take 100 mg by mouth at bedtime.     . isosorbide mononitrate (IMDUR) 30 MG 24 hr tablet Take 1 tablet (30 mg total) by mouth daily.  30 tablet 6  . KLOR-CON M20 20 MEQ tablet TAKE 1 TABLET (20 MEQ TOTAL) BY MOUTH DAILY. 30 tablet 2  . linagliptin (TRADJENTA) 5 MG TABS tablet Take 5 mg by mouth daily after breakfast.     . Melatonin 5 MG TABS Take 5 mg by mouth daily.    . nitroGLYCERIN (NITROSTAT) 0.4 MG SL tablet Place 1 tablet (0.4 mg total) under the tongue every 5 (five) minutes as needed for chest pain. 25 tablet 3  . ramipril (ALTACE) 5 MG capsule Take 5 mg by mouth daily with breakfast.     . simvastatin (ZOCOR) 40 MG tablet Take 40 mg by mouth at bedtime.     . vitamin C (ASCORBIC ACID) 500 MG tablet Take 500 mg by mouth daily.    .  carvedilol (COREG) 3.125 MG tablet Take 1 tablet (3.125 mg total) by mouth 2 (two) times daily. 180 tablet 3  . hydrochlorothiazide (HYDRODIURIL) 25 MG tablet Take 1 tablet (25 mg total) by mouth daily. 90 tablet 3   No current facility-administered medications for this visit.    Facility-Administered Medications Ordered in Other Visits  Medication Dose Route Frequency Provider Last Rate Last Dose  . sodium chloride 0.9 % injection 10 mL  10 mL Intracatheter PRN Ladell Pier, MD        Allergies  Allergen Reactions  . Aspirin Palpitations    Social History   Social History  . Marital status: Married    Spouse name: N/A  . Number of children: 4  . Years of education: N/A   Occupational History  .  Retired   Social History Main Topics  . Smoking status: Never Smoker  . Smokeless tobacco: Never Used  . Alcohol use No  . Drug use: No  . Sexual activity: Not on file   Other Topics Concern  . Not on file   Social History Narrative   Married to husband, Herbie [place]   Retired Engineer, manufacturing systems     Review of Systems: General: negative for chills, fever, night sweats or weight changes.  Cardiovascular: negative for dyspnea on exertion, edema, orthopnea, palpitations, paroxysmal nocturnal dyspnea or shortness of breath Dermatological: negative for rash Respiratory: negative for cough or wheezing Urologic: negative for hematuria Abdominal: negative for diarrhea, bright red blood per rectum, melena, or hematemesis Neurologic: negative for visual changes, syncope, or dizziness All other systems reviewed and are otherwise negative except as noted above.    Blood pressure 139/82, pulse (!) 103, height '5\' 4"'$  (1.626 m), weight 216 lb 3.2 oz (98.1 kg).  General appearance: alert, cooperative, no distress and morbidly obese Neck: no JVD Lungs: clear to auscultation bilaterally Heart: regular rate and rhythm Abdomen: obese, no obvious RLQ tenderness Extremities: extremities normal,  atraumatic, no cyanosis or edema Pulses: 2+ and symmetric Skin: Skin color, texture, turgor normal. No rashes or lesions Neurologic: Grossly normal  EKG  Normal sinus rhythm at 100, nonspecific T wave changes  ASSESSMENT: 1. Mild, nonobstructive coronary disease 2. Essential hypertension 3. Dyslipidemia 4. Type 2 diabetes with neuropathy 5. Moderate obesity   PLAN: 1. Mrs. Melrose fortunately had very mild coronary artery disease on her recent heart catheterization. She was placed on long-acting nitrate. In general she's done well since then. Blood pressure appears well controlled today. Heart rate remains elevated and she may not be responding well to her Inderide. I would recommend discontinuing that and switching her to carvedilol 3.125 mg twice a day with HCTZ 25 mg daily  separate. We'll need to monitor heart rate and blood pressure after these changes. Plan to see her back in 3 months for follow-up.  Pixie Casino, MD, [facility] Attending Cardiologist [facility]  06/24/2016 11:07 AM

## 2016-06-25 MED FILL — Nitroglycerin IV Soln 200 MCG/ML in D5W: INTRAVENOUS | Qty: 250 | Status: AC

## 2016-08-12 ENCOUNTER — Other Ambulatory Visit: Payer: Self-pay | Admitting: Nurse Practitioner

## 2016-08-12 DIAGNOSIS — C7800 Secondary malignant neoplasm of unspecified lung: Principal | ICD-10-CM

## 2016-08-12 DIAGNOSIS — I10 Essential (primary) hypertension: Secondary | ICD-10-CM

## 2016-08-12 DIAGNOSIS — C189 Malignant neoplasm of colon, unspecified: Secondary | ICD-10-CM

## 2016-09-28 ENCOUNTER — Ambulatory Visit (INDEPENDENT_AMBULATORY_CARE_PROVIDER_SITE_OTHER): Payer: Medicare Other | Admitting: Internal Medicine

## 2016-09-28 ENCOUNTER — Encounter: Payer: Self-pay | Admitting: Internal Medicine

## 2016-09-28 VITALS — BP 126/78 | HR 91 | Ht 64.0 in | Wt 219.0 lb

## 2016-09-28 DIAGNOSIS — E782 Mixed hyperlipidemia: Secondary | ICD-10-CM

## 2016-09-28 DIAGNOSIS — I2510 Atherosclerotic heart disease of native coronary artery without angina pectoris: Secondary | ICD-10-CM

## 2016-09-28 DIAGNOSIS — I10 Essential (primary) hypertension: Secondary | ICD-10-CM | POA: Diagnosis not present

## 2016-09-28 DIAGNOSIS — I200 Unstable angina: Secondary | ICD-10-CM | POA: Diagnosis not present

## 2016-09-28 MED ORDER — HYDROCHLOROTHIAZIDE 12.5 MG PO TABS: 12.5000 mg | ORAL_TABLET | Freq: Every day | ORAL | 3 refills | Status: DC

## 2016-09-28 NOTE — Progress Notes (Signed)
09/28/2016 Peggy French   1941-06-15  [id number]  Primary Physician Maggie Font, MD Primary Cardiologist: Dr Debara Pickett  CC: No complaints  HPI:  Pleasant 76 y/o obese AA female with a history of minor CAD by cath in 2001 and 2012. She has had chest pain and was seen in the ED in Sept 2016 and Myoview, chest CTA, and echo in Dec 2016 were unremarkable then. She was seen again in the ED Sept 2017 and ruled out for an MI. The pt says she had chest pain over the weekend. She did not go to the hospital.  She saw Dr Berdine Addison yesterday and he reassured her her EKG looked OK but wanted her seen here. The pt describes mid sternal "heaviness and tightness" that started Friday PM and lasted all weekend. Friday she had associated nausea, vomiting, and "sweating". Her pain would come and go, not obviously worsened with activity. She did not take NTG. She is on daily Nexium and is s/p remote cholecystectomy. Other medical problems include HTN, HLD, NIDDM, obesity, GERD, colon cancer s/p colectomy, and lung cancer s/p wedge resection.   06/24/2016  [name] returns today for follow-up of her heart catheterization. I have not seen her in a number of years. She recently saw Peggy Ransom, PA-C, who recommended a stress test for some symptoms concerning for angina. That stress test was negative for ischemia however, there was an abnormal TID ratio. I was planning on arranging for her catheterization however she had some more chest pain symptoms and presented to [facility]. Her pain was relieved with nitroglycerin and she was referred for cardiac catheterization. This was performed on 06/18/2016 by Dr. Saunders Revel, which demonstrated up to 30% mild to moderate nonobstructive coronary disease. LVEDP was 5 mmHg, LV EF was 60-65%. It was recommended that she go on to a long-acting nitrate for possible small vessel coronary disease. She says that she's been taking this and still is required a couple extra sublingual  nitroglycerin. She's been under significant stress. Her son was killed a number of years ago and had another son who died recently. Think this is playing a big role in things.  09/28/2016  [name] returns today for follow-up. Overall she is doing well without any recurrent chest pain. Blood pressure today is well controlled at 126/78. She denies any chest pain or worsening shortness of breath. EKG shows sinus rhythm at 91. It seems like the carvedilol seems to be working better at rate control for her. She was also started on HCTZ which he is helpful for her blood pressure is well.   Current Outpatient Prescriptions  Medication Sig Dispense Refill  . acetaminophen (TYLENOL) 500 MG tablet Take 1,000 mg by mouth every 6 (six) hours as needed for mild pain or moderate pain. For pain    . allopurinol (ZYLOPRIM) 100 MG tablet Take 100 mg by mouth daily.  1  . amLODipine (NORVASC) 5 MG tablet Take 5 mg by mouth daily as needed. Only take when BP readings are high    . aspirin EC 81 MG tablet Take 1 tablet (81 mg total) by mouth daily.    . carboxymethylcellulose (REFRESH PLUS) 0.5 % SOLN Place 2 drops into both eyes 3 (three) times daily as needed (dry eyes).     . Cyanocobalamin (B-12 PO) Take 1 tablet by mouth daily.    . diphenoxylate-atropine (LOMOTIL) 2.5-0.025 MG tablet Take 1 tablet by mouth 4 (four) times daily as needed for diarrhea or  loose stools.    Marland Kitchen esomeprazole (NEXIUM) 40 MG capsule Take 40 mg by mouth every evening.    . fexofenadine (ALLEGRA) 180 MG tablet Take 180 mg by mouth daily. For Allergies    . gabapentin (NEURONTIN) 100 MG capsule Take 100 mg by mouth at bedtime.     . isosorbide mononitrate (IMDUR) 30 MG 24 hr tablet Take 1 tablet (30 mg total) by mouth daily. 30 tablet 6  . KLOR-CON M20 20 MEQ tablet TAKE 1 TABLET (20 MEQ TOTAL) BY MOUTH DAILY. 30 tablet 2  . linagliptin (TRADJENTA) 5 MG TABS tablet Take 5 mg by mouth daily after breakfast.     . Melatonin 5 MG TABS Take 5 mg  by mouth daily.    . ramipril (ALTACE) 5 MG capsule Take 5 mg by mouth daily with breakfast.     . simvastatin (ZOCOR) 40 MG tablet Take 40 mg by mouth at bedtime.     . vitamin C (ASCORBIC ACID) 500 MG tablet Take 500 mg by mouth daily.    . carvedilol (COREG) 3.125 MG tablet Take 1 tablet (3.125 mg total) by mouth 2 (two) times daily. 180 tablet 3  . hydrochlorothiazide (HYDRODIURIL) 12.5 MG tablet Take 1 tablet (12.5 mg total) by mouth daily. 90 tablet 3  . nitroGLYCERIN (NITROSTAT) 0.4 MG SL tablet Place 1 tablet (0.4 mg total) under the tongue every 5 (five) minutes as needed for chest pain. 25 tablet 3   No current facility-administered medications for this visit.    Facility-Administered Medications Ordered in Other Visits  Medication Dose Route Frequency Provider Last Rate Last Dose  . sodium chloride 0.9 % injection 10 mL  10 mL Intracatheter PRN Ladell Pier, MD        Allergies  Allergen Reactions  . Aspirin Palpitations    Social History   Social History  . Marital status: Married    Spouse name: N/A  . Number of children: 4  . Years of education: N/A   Occupational History  .  Retired   Social History Main Topics  . Smoking status: Never Smoker  . Smokeless tobacco: Never Used  . Alcohol use No  . Drug use: No  . Sexual activity: Not on file   Other Topics Concern  . Not on file   Social History Narrative   Married to husband, Peggy French   Retired Engineer, manufacturing systems     Review of Systems: General: negative for chills, fever, night sweats or weight changes.  Cardiovascular: negative for dyspnea on exertion, edema, orthopnea, palpitations, paroxysmal nocturnal dyspnea or shortness of breath Dermatological: negative for rash Respiratory: negative for cough or wheezing Urologic: negative for hematuria Abdominal: negative for diarrhea, bright red blood per rectum, melena, or hematemesis Neurologic: negative for visual changes, syncope, or dizziness All other  systems reviewed and are otherwise negative except as noted above.    Blood pressure 126/78, pulse 91, height '5\' 4"'$  (1.626 m), weight 219 lb (99.3 kg).  General appearance: alert, cooperative, no distress and morbidly obese Neck: no JVD Lungs: clear to auscultation bilaterally Heart: regular rate and rhythm Abdomen: obese, no obvious RLQ tenderness Extremities: extremities normal, atraumatic, no cyanosis or edema Pulses: 2+ and symmetric Skin: Skin color, texture, turgor normal. No rashes or lesions Neurologic: Grossly normal  EKG  Normal Sinus rhythm at 91  ASSESSMENT: 1. Mild, nonobstructive coronary disease 2. Essential hypertension 3. Dyslipidemia 4. Type 2 diabetes with neuropathy 5. Moderate obesity   PLAN: Mrs. Melendrez  does seem to have better control over her heart rate 9 carvedilol. Her blood pressure is now better controlled at goal. We'll continue her current medications. She is asymptomatic and denies any recurrent chest pain. Follow-up with me in 6 months or sooner as necessary.   Pixie Casino, MD, [facility] Attending Cardiologist [facility]  09/28/2016 5:37 PM

## 2016-09-28 NOTE — Patient Instructions (Signed)
Medication Instructions: Please take a 12.5 mg tablet (one tablet) of Hydrochlorothiazide daily.   Follow-Up: Your physician recommends that you schedule a follow-up appointment in: [place] DR. HILTY   If you need a refill on your cardiac medications before your next appointment, please call your pharmacy.

## 2016-11-09 ENCOUNTER — Other Ambulatory Visit: Payer: Self-pay | Admitting: Nurse Practitioner

## 2016-11-09 DIAGNOSIS — C189 Malignant neoplasm of colon, unspecified: Secondary | ICD-10-CM

## 2016-11-09 DIAGNOSIS — C7800 Secondary malignant neoplasm of unspecified lung: Principal | ICD-10-CM

## 2016-11-09 DIAGNOSIS — I10 Essential (primary) hypertension: Secondary | ICD-10-CM

## 2016-11-22 ENCOUNTER — Other Ambulatory Visit: Payer: Self-pay | Admitting: *Deleted

## 2016-11-22 DIAGNOSIS — C7800 Secondary malignant neoplasm of unspecified lung: Principal | ICD-10-CM

## 2016-11-22 DIAGNOSIS — C189 Malignant neoplasm of colon, unspecified: Secondary | ICD-10-CM

## 2016-11-22 DIAGNOSIS — I10 Essential (primary) hypertension: Secondary | ICD-10-CM

## 2016-11-22 MED ORDER — POTASSIUM CHLORIDE CRYS ER 20 MEQ PO TBCR
20.0000 meq | EXTENDED_RELEASE_TABLET | Freq: Every day | ORAL | 2 refills | Status: AC
Start: 2016-11-22 — End: ?

## 2016-12-07 ENCOUNTER — Ambulatory Visit (HOSPITAL_BASED_OUTPATIENT_CLINIC_OR_DEPARTMENT_OTHER): Payer: Medicare Other | Admitting: Nurse Practitioner

## 2016-12-07 ENCOUNTER — Telehealth: Payer: Self-pay | Admitting: Oncology

## 2016-12-07 ENCOUNTER — Other Ambulatory Visit (HOSPITAL_BASED_OUTPATIENT_CLINIC_OR_DEPARTMENT_OTHER): Payer: Medicare Other

## 2016-12-07 VITALS — BP 157/77 | HR 79 | Temp 97.9°F | Resp 18 | Ht 64.0 in | Wt 219.0 lb

## 2016-12-07 DIAGNOSIS — Z85038 Personal history of other malignant neoplasm of large intestine: Secondary | ICD-10-CM

## 2016-12-07 DIAGNOSIS — D509 Iron deficiency anemia, unspecified: Secondary | ICD-10-CM | POA: Diagnosis not present

## 2016-12-07 DIAGNOSIS — C189 Malignant neoplasm of colon, unspecified: Secondary | ICD-10-CM

## 2016-12-07 DIAGNOSIS — C7800 Secondary malignant neoplasm of unspecified lung: Principal | ICD-10-CM

## 2016-12-07 LAB — CEA (IN HOUSE-CHCC): CEA (CHCC-In House): 3.36 ng/mL (ref 0.00–5.00)

## 2016-12-07 NOTE — Progress Notes (Addendum)
Peggy OFFICE PROGRESS NOTE   Diagnosis:  Colon cancer  INTERVAL HISTORY:   Ms. French returns as scheduled. She overall is feeling well. Bowels moving regularly. She has occasional loose stools. She has occasional mild nausea. She reports a good appetite. She has stable dyspnea on exertion. Several weeks ago she noted pain in the left breast. She has noted several "scabs" on both nipples. No associated bleeding or pruritus.  Objective:  Vital signs in last 24 hours:  Blood pressure (!) 157/77, pulse 79, temperature 97.9 F (36.6 C), temperature source Oral, resp. rate 18, height 5\' 4"  (1.626 m), weight 219 lb (99.3 kg), SpO2 97 %.    HEENT: Neck without mass. Lymphatics: No palpable cervical, supra clavicular, axillary or inguinal lymph nodes. Resp: Lungs clear bilaterally. Cardio: Regular rate and rhythm. GI: Abdomen soft and nontender. No hepatomegaly. Vascular: No leg edema. Breasts: No mass palpated in either breast. 2 dry appearing lesions on the left nipple and one on the right.  Lab Results:  Lab Results  Component Value Date   WBC 6.5 06/19/2016   HGB 10.8 (L) 06/19/2016   HCT 33.0 (L) 06/19/2016   MCV 79.9 06/19/2016   PLT 203 06/19/2016   NEUTROABS 2.4 02/10/2015    Imaging:  No results found.  Medications: I have reviewed the patient's current medications.  Assessment/Plan: 1. 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 She began adjuvant CAPOX chemotherapy on 02/24/2012. She completed cycle 8 on 07/27/2012. Negative surveillance colonoscopy 12/08/2012 2. Delayed nausea following cycle 1 CAPOX. Aloxi was added beginning with cycle 2 with improvement. 3. Diabetes. 4. Microcytic anemia. Hemoglobin is normal. She will discontinue iron. 5. Remote history of a "colon tumor" removed endoscopically in 1980. 6. Left lower lung nodule noted on a CT 02/21/2012, slightly larger than on a CT  12/03/2011. Chest CT 06/12/2012 showed the previously seen 5 mm pulmonary nodule in left lower lobe was scarcely visualized, estimated at 3 mm. No other suspicious pulmonary nodules were identified. Chest CT 11/27/2012 showed interval enlargement of the left lower lobe pulmonary nodule with a current measurement of 7 mm x 8 mm. No new pulmonary nodules. CT 02/22/2013 with enlargement of the anterior left lower lobe nodule and no other lung nodules. PET scan 03/02/2013 showed no associated hypermetabolism with the 12 mm left lower lobe pulmonary nodule. No hypermetabolic lymph nodes in the neck, chest, abdomen or pelvis. No abnormal hypermetabolic activity within the liver, pancreas, adrenal glands or spleen. Abnormal FDG accumulation in the posterior left nasopharyngeal mucosa.  Status post a wedge resection of the left lower lobe nodule on 03/27/2013 with the pathology confirming metastatic adenocarcinoma of colorectal primary.   Chest CT 07/18/2013-negative for recurrent disease.   Chest CT 09/19/2013 to evaluate for shortness of breath showed postoperative changes in the left lower lobe without evidence of recurrent or metastatic disease.  CT chest 04/01/2014 with postsurgical scarring in the left lower lobe. No evidence for metastatic disease in the chest. Stable appearance of mild subpleural reticulation of the lung bases.  CT chest/abdomen/pelvis 10/01/2014 with postoperative changes left lower lobe. No evidence of metastatic disease in the chest, abdomen or pelvis.  CT chest/abdomen/pelvis 11/03/2015 with no evidence of recurrent malignancy.  Colonoscopy 03/25/2016-tubular adenomas removed from the transverse colon and rectum 7. Status post Port-A-Cath placement 02/22/2012. Status post Port-A-Cath repositioning 03/14/2012. Port-A-Cath removed 06/04/2014 8. Probable acute laryngopharyngeal dysesthesia following cycle 2 CAPOX. Symptoms resolved with warming.  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 She has persistent neuropathy symptoms involving the feet. Improved with Neurontin 10. History of anterior chest pain-she reports undergoing an evaluation by Dr. Debara Pickett. 11. 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   Disposition: Peggy French remains in clinical remission from colon cancer. We will follow-up on the [name] from today. [name] recommends proceeding with surveillance CT scans in the next few weeks.  At today's visit she reports several episodes of pain involving the left breast. Mammogram 06/02/2016 negative for evidence of malignancy. No breast mass on exam today. She has several patches of dryness on both nipples with no associated symptoms. She will monitor for now and contact the office if the left breast pain and/or dry areas on the nipples persist or change.  She will return for a follow-up visit in 6 months. She will contact the office in the interim as outlined above or with any other problems.  Patient seen with [name]. 25 minutes were spent face-to-face at today's visit with the majority of that time involved in counseling/coordination of care.    Ned Card ANP/GNP-BC   12/07/2016  12:46 PM  This was a shared visit with Ned Card. Peggy French was interviewed and examined. The left breast exam appears benign. She will contact us for a palpable change.  She will be scheduled for surveillance CT scans.  Peggy French will return for an office visit in 6 months.  Julieanne Manson, M.D.

## 2016-12-07 NOTE — Telephone Encounter (Signed)
Lab and follow up appointment scheduled for 6 months, per 12/07/16 los. Patient was given a copy of the AVS report and appointment schedule, per 12/07/16 los.

## 2016-12-15 ENCOUNTER — Telehealth: Payer: Self-pay | Admitting: *Deleted

## 2016-12-15 NOTE — Telephone Encounter (Signed)
Message received from patient requesting her lab results from 12/07/16.  Call placed back to patient to inform her that her [name] level remains WNL.  Patient appreciative of call back and has no further questions at this time.

## 2017-01-06 ENCOUNTER — Other Ambulatory Visit: Payer: Self-pay | Admitting: Physician Assistant

## 2017-01-07 NOTE — Telephone Encounter (Signed)
Rx(s) sent to pharmacy electronically.  

## 2017-02-20 ENCOUNTER — Other Ambulatory Visit: Payer: Self-pay | Admitting: Nurse Practitioner

## 2017-02-20 DIAGNOSIS — C7800 Secondary malignant neoplasm of unspecified lung: Principal | ICD-10-CM

## 2017-02-20 DIAGNOSIS — C189 Malignant neoplasm of colon, unspecified: Secondary | ICD-10-CM

## 2017-02-20 DIAGNOSIS — I10 Essential (primary) hypertension: Secondary | ICD-10-CM

## 2017-04-04 ENCOUNTER — Ambulatory Visit: Payer: Medicare Other | Admitting: Internal Medicine

## 2017-05-12 ENCOUNTER — Ambulatory Visit (INDEPENDENT_AMBULATORY_CARE_PROVIDER_SITE_OTHER): Payer: Medicare Other | Admitting: Internal Medicine

## 2017-05-12 ENCOUNTER — Encounter: Payer: Self-pay | Admitting: Internal Medicine

## 2017-05-12 VITALS — BP 124/82 | HR 84 | Ht 64.0 in | Wt 223.0 lb

## 2017-05-12 DIAGNOSIS — E782 Mixed hyperlipidemia: Secondary | ICD-10-CM

## 2017-05-12 DIAGNOSIS — I10 Essential (primary) hypertension: Secondary | ICD-10-CM | POA: Diagnosis not present

## 2017-05-12 DIAGNOSIS — I2510 Atherosclerotic heart disease of native coronary artery without angina pectoris: Secondary | ICD-10-CM

## 2017-05-12 NOTE — Patient Instructions (Signed)
Your physician wants you to follow-up in: 6 months with Dr. Hilty. You will receive a reminder letter in the mail two months in advance. If you don't receive a letter, please call our office to schedule the follow-up appointment.    

## 2017-05-12 NOTE — Progress Notes (Signed)
05/12/2017 Peggy French   1941-04-14  [id number]  Primary Physician Iona Beard, MD Primary Cardiologist: Dr Debara Pickett  CC: No complaints  HPI:  Pleasant 76 y/o obese AA female with a history of minor CAD by cath in 2001 and 2012. She has had chest pain and was seen in the ED in Sept 2016 and Myoview, chest CTA, and echo in Dec 2016 were unremarkable then. She was seen again in the ED Sept 2017 and ruled out for an MI. The pt says she had chest pain over the weekend. She did not go to the hospital.  She saw Dr Berdine Addison yesterday and he reassured her her EKG looked OK but wanted her seen here. The pt describes mid sternal "heaviness and tightness" that started Friday PM and lasted all weekend. Friday she had associated nausea, vomiting, and "sweating". Her pain would come and go, not obviously worsened with activity. She did not take NTG. She is on daily Nexium and is s/p remote cholecystectomy. Other medical problems include HTN, HLD, NIDDM, obesity, GERD, colon cancer s/p colectomy, and lung cancer s/p wedge resection.   06/24/2016  Peggy French returns today for follow-up of her heart catheterization. I have not seen her in a number of years. She recently saw [name], PA-C, who recommended a stress test for some symptoms concerning for angina. That stress test was negative for ischemia however, there was an abnormal TID ratio. I was planning on arranging for her catheterization however she had some more chest pain symptoms and presented to [facility]. Her pain was relieved with nitroglycerin and she was referred for cardiac catheterization. This was performed on 06/18/2016 by Dr. Saunders Revel, which demonstrated up to 30% mild to moderate nonobstructive coronary disease. LVEDP was 5 mmHg, LV EF was 60-65%. It was recommended that she go on to a long-acting nitrate for possible small vessel coronary disease. She says that she's been taking this and still is required a couple extra sublingual  nitroglycerin. She's been under significant stress. Her son was killed a number of years ago and had another son who died recently. Think this is playing a big role in things.  09/28/2016  Peggy French returns today for follow-up. Overall she is doing well without any recurrent chest pain. Blood pressure today is well controlled at 126/78. She denies any chest pain or worsening shortness of breath. EKG shows sinus rhythm at 91. It seems like the carvedilol seems to be working better at rate control for her. She was also started on HCTZ which he is helpful for her blood pressure is well.  05/12/2017  Peggy French was seen today in follow-up.  She continues to be asymptomatic.  She denies chest pain or worsening shortness of breath.  Blood pressure is well-controlled today 124/82.  She is followed by Dr. Malachy Mood at the cancer center for anemia.  EKG today shows sinus rhythm at 84.  She occasionally gets some chest discomfort which is relieved by Tums.  She is also had some shortness of breath with exertion however is transient and is not worsening.   Current Outpatient Prescriptions  Medication Sig Dispense Refill  . acetaminophen (TYLENOL) 500 MG tablet Take 1,000 mg by mouth every 6 (six) hours as needed for mild pain or moderate pain. For pain    . allopurinol (ZYLOPRIM) 100 MG tablet Take 100 mg by mouth daily.  1  . amLODipine (NORVASC) 5 MG tablet Take 5 mg by mouth daily as needed. Only take when BP  readings are high    . aspirin EC 81 MG tablet Take 1 tablet (81 mg total) by mouth daily.    . carboxymethylcellulose (REFRESH PLUS) 0.5 % SOLN Place 2 drops into both eyes 3 (three) times daily as needed (dry eyes).     . carvedilol (COREG) 3.125 MG tablet Take 1 tablet (3.125 mg total) by mouth 2 (two) times daily. 180 tablet 3  . Cyanocobalamin (B-12 PO) Take 1 tablet by mouth daily.    . diphenoxylate-atropine (LOMOTIL) 2.5-0.025 MG tablet Take 1 tablet by mouth 4 (four) times daily as needed for diarrhea or  loose stools.    Marland Kitchen esomeprazole (NEXIUM) 40 MG capsule Take 40 mg by mouth every evening.    . fexofenadine (ALLEGRA) 180 MG tablet Take 180 mg by mouth daily. For Allergies    . gabapentin (NEURONTIN) 100 MG capsule Take 100 mg by mouth at bedtime.     . hydrochlorothiazide (HYDRODIURIL) 12.5 MG tablet Take 1 tablet (12.5 mg total) by mouth daily. 90 tablet 3  . isosorbide mononitrate (IMDUR) 30 MG 24 hr tablet TAKE 1 TABLET (30 MG TOTAL) BY MOUTH DAILY. 30 tablet 8  . linagliptin (TRADJENTA) 5 MG TABS tablet Take 5 mg by mouth daily after breakfast.     . meclizine (ANTIVERT) 25 MG tablet Take 25 mg by mouth every 6 (six) hours as needed. AS NEEDED FOR DIZZINESS  0  . Melatonin 5 MG TABS Take 5 mg by mouth daily.    . nitroGLYCERIN (NITROSTAT) 0.4 MG SL tablet Place 1 tablet (0.4 mg total) under the tongue every 5 (five) minutes as needed for chest pain. 25 tablet 3  . potassium chloride SA (KLOR-CON M20) 20 MEQ tablet Take 1 tablet (20 mEq total) by mouth daily. 30 tablet 2  . ramipril (ALTACE) 5 MG capsule Take 5 mg by mouth daily with breakfast.     . simvastatin (ZOCOR) 40 MG tablet Take 40 mg by mouth at bedtime.     . vitamin C (ASCORBIC ACID) 500 MG tablet Take 500 mg by mouth daily.     No current facility-administered medications for this visit.    Facility-Administered Medications Ordered in Other Visits  Medication Dose Route Frequency Provider Last Rate Last Dose  . sodium chloride 0.9 % injection 10 mL  10 mL Intracatheter PRN Ladell Pier, MD        Allergies  Allergen Reactions  . Aspirin Palpitations    Social History   Social History  . Marital status: Married    Spouse name: N/A  . Number of children: 4  . Years of education: N/A   Occupational History  .  Retired   Social History Main Topics  . Smoking status: Never Smoker  . Smokeless tobacco: Never Used  . Alcohol use No  . Drug use: No  . Sexual activity: Not on file   Other Topics Concern  .  Not on file   Social History Narrative   Married to husband, Herbie [place]   Retired Engineer, manufacturing systems     Review of Systems: Pertinent items noted in HPI and remainder of comprehensive ROS otherwise negative.  Exam: Blood pressure 124/82, pulse 84, height 5\' 4"  (1.626 m), weight 223 lb (101.2 kg), SpO2 97 %.  General appearance: alert, cooperative, no distress and morbidly obese Neck: no JVD Lungs: clear to auscultation bilaterally Heart: regular rate and rhythm Abdomen: obese, no obvious RLQ tenderness Extremities: extremities normal, atraumatic, no cyanosis or edema Pulses:  2+ and symmetric Skin: Skin color, texture, turgor normal. No rashes or lesions Neurologic: Grossly normal  EKG  Normal sinus rhythm 84, low voltage QRS, nonspecific T wave changes-personally reviewed  ASSESSMENT: 1. Mild, nonobstructive coronary disease 2. Essential hypertension 3. Dyslipidemia 4. Type 2 diabetes with neuropathy 5. Moderate obesity   PLAN: Peggy French continues to do well on her current medications.  Blood pressures well controlled.  Cholesterol is at goal.  Diabetes followed by Dr. Berdine Addison.  She needs to continue to work on weight loss and more activity.  There does not seem to be any active coronary disease.  Follow-up with me in 6 months or sooner as necessary.  Pixie Casino, MD, [facility] Attending Cardiologist [place] [facility]  05/12/2017 6:03 PM

## 2017-06-09 ENCOUNTER — Ambulatory Visit (HOSPITAL_BASED_OUTPATIENT_CLINIC_OR_DEPARTMENT_OTHER): Payer: Medicare Other | Admitting: Oncology

## 2017-06-09 ENCOUNTER — Other Ambulatory Visit (HOSPITAL_BASED_OUTPATIENT_CLINIC_OR_DEPARTMENT_OTHER): Payer: Medicare Other | Admitting: *Deleted

## 2017-06-09 ENCOUNTER — Other Ambulatory Visit (HOSPITAL_BASED_OUTPATIENT_CLINIC_OR_DEPARTMENT_OTHER): Payer: Medicare Other

## 2017-06-09 ENCOUNTER — Telehealth: Payer: Self-pay | Admitting: Emergency Medicine

## 2017-06-09 ENCOUNTER — Telehealth: Payer: Self-pay

## 2017-06-09 ENCOUNTER — Encounter: Payer: Self-pay | Admitting: Oncology

## 2017-06-09 VITALS — BP 155/65 | HR 96 | Temp 98.4°F | Resp 24 | Ht 64.0 in | Wt 222.0 lb

## 2017-06-09 DIAGNOSIS — C189 Malignant neoplasm of colon, unspecified: Secondary | ICD-10-CM

## 2017-06-09 DIAGNOSIS — C7800 Secondary malignant neoplasm of unspecified lung: Principal | ICD-10-CM

## 2017-06-09 DIAGNOSIS — E119 Type 2 diabetes mellitus without complications: Secondary | ICD-10-CM

## 2017-06-09 DIAGNOSIS — C182 Malignant neoplasm of ascending colon: Secondary | ICD-10-CM

## 2017-06-09 DIAGNOSIS — D509 Iron deficiency anemia, unspecified: Secondary | ICD-10-CM | POA: Diagnosis not present

## 2017-06-09 DIAGNOSIS — Z85038 Personal history of other malignant neoplasm of large intestine: Secondary | ICD-10-CM

## 2017-06-09 LAB — BASIC METABOLIC PANEL
ANION GAP: 10 meq/L (ref 3–11)
BUN: 15.1 mg/dL (ref 7.0–26.0)
CO2: 25 mEq/L (ref 22–29)
Calcium: 8.5 mg/dL (ref 8.4–10.4)
Chloride: 105 mEq/L (ref 98–109)
Creatinine: 1.1 mg/dL (ref 0.6–1.1)
EGFR: 59 mL/min/{1.73_m2} — AB (ref >60)
GLUCOSE: 154 mg/dL — AB (ref 70–140)
POTASSIUM: 3.7 meq/L (ref 3.5–5.1)
Sodium: 140 mEq/L (ref 136–145)

## 2017-06-09 LAB — CEA (IN HOUSE-CHCC): CEA (CHCC-IN HOUSE): 3.85 ng/mL (ref 0.00–5.00)

## 2017-06-09 NOTE — Progress Notes (Signed)
[facility] OFFICE PROGRESS NOTE   Diagnosis: Colon cancer  INTERVAL HISTORY:   Peggy French returns as scheduled.  She generally feels well.  She has occasional diarrhea.  No bleeding.  No specific complaint.  Objective:  Vital signs in last 24 hours:  Blood pressure (!) 155/65, pulse 96, temperature 98.4 F (36.9 C), temperature source Oral, resp. rate (!) 24, height 5\' 4"  (1.626 m), weight 222 lb (100.7 kg), SpO2 98 %.    HEENT: Neck without mass Lymphatics: No cervical, supraclavicular, axillary, or inguinal nodes Resp: Lungs clear bilaterally Cardio: Regular rate and rhythm GI: No hepatomegaly, no mass, nontender Vascular: No leg edema   Lab Results:   Lab Results  Component Value Date   [name]1 3.36 12/07/2016     Medications: I have reviewed the patient's current medications.  Assessment/Plan: 1. 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 She began adjuvant CAPOX chemotherapy on 02/24/2012. She completed cycle 8 on 07/27/2012. Negative surveillance colonoscopy 12/08/2012 2. Delayed nausea following cycle 1 CAPOX. Aloxi was added beginning with cycle 2 with improvement. 3. Diabetes. 4. Microcytic anemia. Hemoglobin is normal. She will discontinue iron. 5. Remote history of a "colon tumor" removed endoscopically in 1980. 6. Left lower lung nodule noted on a CT 02/21/2012, slightly larger than on a CT 12/03/2011. Chest CT 06/12/2012 showed the previously seen 5 mm pulmonary nodule in left lower lobe was scarcely visualized, estimated at 3 mm. No other suspicious pulmonary nodules were identified. Chest CT 11/27/2012 showed interval enlargement of the left lower lobe pulmonary nodule with a current measurement of 7 mm x 8 mm. No new pulmonary nodules. CT 02/22/2013 with enlargement of the anterior left lower lobe nodule and no other lung nodules. PET scan 03/02/2013 showed no associated hypermetabolism with the  12 mm left lower lobe pulmonary nodule. No hypermetabolic lymph nodes in the neck, chest, abdomen or pelvis. No abnormal hypermetabolic activity within the liver, pancreas, adrenal glands or spleen. Abnormal FDG accumulation in the posterior left nasopharyngeal mucosa.  Status post a wedge resection of the left lower lobe nodule on 03/27/2013 with the pathology confirming metastatic adenocarcinoma of colorectal primary.   Chest CT 07/18/2013-negative for recurrent disease.   Chest CT 09/19/2013 to evaluate for shortness of breath showed postoperative changes in the left lower lobe without evidence of recurrent or metastatic disease.  CT chest 04/01/2014 with postsurgical scarring in the left lower lobe. No evidence for metastatic disease in the chest. Stable appearance of mild subpleural reticulation of the lung bases.  CT chest/abdomen/pelvis 10/01/2014 with postoperative changes left lower lobe. No evidence of metastatic disease in the chest, abdomen or pelvis.  CT chest/abdomen/pelvis 11/03/2015 with no evidence of recurrent malignancy.  Colonoscopy 03/25/2016-tubular adenomas removed from the transverse colon and rectum 7. Status post Port-A-Cath placement 02/22/2012. Status post Port-A-Cath repositioning 03/14/2012. Port-A-Cath removed 06/04/2014 8. Probable acute laryngopharyngeal dysesthesia following cycle 2 CAPOX. Symptoms resolved with warming.  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 She has persistent neuropathy symptoms involving the feet. Improved with Neurontin 10. History of anterior chest pain-she reports undergoing an evaluation by Dr. Debara Pickett. 11. 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   Disposition:  Peggy French remains in clinical remission from colon cancer.  Surveillance CT scans were ordered when she was here in May.  These were not scheduled.  She will be scheduled for CTs of the chest, abdomen, and  pelvis within the next week.  Peggy French will return for an office visit and [name] in 6 months.  We will follow-up on the [name] from today.  15 minutes were spent with the patient today.  The majority of the time was used for counseling and coordination of care.  [name], MD  06/09/2017  12:39 PM

## 2017-06-09 NOTE — Telephone Encounter (Addendum)
Left VM on pt phone regarding this note.   ----- Message from Ladell Pier, MD sent at 06/09/2017  2:14 PM EST ----- Please call patient, [name] is normal

## 2017-06-09 NOTE — Telephone Encounter (Signed)
Printed avs and calender for upcoming appointment. Per 11/29 los 

## 2017-06-17 ENCOUNTER — Ambulatory Visit (HOSPITAL_COMMUNITY)
Admission: RE | Admit: 2017-06-17 | Discharge: 2017-06-17 | Disposition: A | Discharge status: Home / Self Care | Payer: Medicare Other | Source: Ambulatory Visit | Attending: Nurse Practitioner | Admitting: Nurse Practitioner

## 2017-06-17 ENCOUNTER — Encounter (HOSPITAL_COMMUNITY): Payer: Self-pay

## 2017-06-17 DIAGNOSIS — C7800 Secondary malignant neoplasm of unspecified lung: Secondary | ICD-10-CM | POA: Insufficient documentation

## 2017-06-17 DIAGNOSIS — K449 Diaphragmatic hernia without obstruction or gangrene: Secondary | ICD-10-CM | POA: Insufficient documentation

## 2017-06-17 DIAGNOSIS — I700 Atherosclerosis of aorta: Secondary | ICD-10-CM | POA: Insufficient documentation

## 2017-06-17 DIAGNOSIS — J479 Bronchiectasis, uncomplicated: Secondary | ICD-10-CM | POA: Diagnosis not present

## 2017-06-17 DIAGNOSIS — C189 Malignant neoplasm of colon, unspecified: Secondary | ICD-10-CM | POA: Insufficient documentation

## 2017-06-17 MED ORDER — IOPAMIDOL (ISOVUE-300) INJECTION 61%
100.0000 mL | Freq: Once | INTRAVENOUS | Status: AC | PRN
  Administered 2017-06-17: 100 mL via INTRAVENOUS

## 2017-06-18 ENCOUNTER — Other Ambulatory Visit: Payer: Self-pay | Admitting: Internal Medicine

## 2017-06-22 ENCOUNTER — Telehealth: Payer: Self-pay | Admitting: Emergency Medicine

## 2017-06-22 NOTE — Telephone Encounter (Addendum)
Pt verbalized understanding of this note.   ----- Message from Ladell Pier, MD sent at 06/22/2017  7:58 AM EST ----- Please call patient, cts are negative for cancer, f/u as scheduled

## 2017-09-06 ENCOUNTER — Other Ambulatory Visit (HOSPITAL_COMMUNITY): Payer: Self-pay | Admitting: Family Medicine

## 2017-09-06 DIAGNOSIS — Z1231 Encounter for screening mammogram for malignant neoplasm of breast: Secondary | ICD-10-CM

## 2017-09-06 DIAGNOSIS — Z0000 Encounter for general adult medical examination without abnormal findings: Secondary | ICD-10-CM

## 2017-09-12 ENCOUNTER — Encounter (HOSPITAL_COMMUNITY): Payer: Self-pay

## 2017-09-12 ENCOUNTER — Ambulatory Visit (HOSPITAL_COMMUNITY)
Admission: RE | Admit: 2017-09-12 | Discharge: 2017-09-12 | Disposition: A | Discharge status: Home / Self Care | Payer: Medicare Other | Source: Ambulatory Visit | Attending: Family Medicine | Admitting: Family Medicine

## 2017-09-12 DIAGNOSIS — Z1231 Encounter for screening mammogram for malignant neoplasm of breast: Secondary | ICD-10-CM | POA: Insufficient documentation

## 2017-09-16 ENCOUNTER — Other Ambulatory Visit: Payer: Self-pay | Admitting: Internal Medicine

## 2017-09-16 NOTE — Telephone Encounter (Signed)
refill 

## 2017-10-07 ENCOUNTER — Other Ambulatory Visit: Payer: Self-pay | Admitting: Internal Medicine

## 2017-11-02 ENCOUNTER — Encounter: Payer: Self-pay | Admitting: Internal Medicine

## 2017-11-02 ENCOUNTER — Ambulatory Visit: Payer: Medicare Other | Admitting: Internal Medicine

## 2017-11-02 VITALS — BP 136/82 | HR 82 | Ht 64.0 in | Wt 226.8 lb

## 2017-11-02 DIAGNOSIS — E785 Hyperlipidemia, unspecified: Secondary | ICD-10-CM | POA: Diagnosis not present

## 2017-11-02 DIAGNOSIS — I10 Essential (primary) hypertension: Secondary | ICD-10-CM

## 2017-11-02 DIAGNOSIS — I2510 Atherosclerotic heart disease of native coronary artery without angina pectoris: Secondary | ICD-10-CM

## 2017-11-02 NOTE — Patient Instructions (Signed)
Your physician recommends that you return for lab work FASTING to check cholesterol   Your physician wants you to follow-up in: ONE YEAR with Dr. Debara Pickett. You will receive a reminder letter in the mail two months in advance. If you don't receive a letter, please call our office to schedule the follow-up appointment.

## 2017-11-02 NOTE — Progress Notes (Signed)
11/02/2017 Peggy French   [birth year]  [id number]  Primary Physician Iona Beard, MD Primary Cardiologist: Dr Debara Pickett  CC: No complaints  HPI:  Pleasant 77 y/o obese AA female with a history of minor CAD by cath in 2001 and 2012. She has had chest pain and was seen in the ED in Sept 2016 and Myoview, chest CTA, and echo in Dec 2016 were unremarkable then. She was seen again in the ED Sept 2017 and ruled out for an MI. The pt says she had chest pain over the weekend. She did not go to the hospital.  She saw Dr Berdine Addison yesterday and he reassured her her EKG looked OK but wanted her seen here. The pt describes mid sternal "heaviness and tightness" that started Friday PM and lasted all weekend. Friday she had associated nausea, vomiting, and "sweating". Her pain would come and go, not obviously worsened with activity. She did not take NTG. She is on daily Nexium and is s/p remote cholecystectomy. Other medical problems include HTN, HLD, NIDDM, obesity, GERD, colon cancer s/p colectomy, and lung cancer s/p wedge resection.   06/24/2016  Peggy French returns today for follow-up of her heart catheterization. I have not seen her in a number of years. She recently saw [name], PA-C, who recommended a stress test for some symptoms concerning for angina. That stress test was negative for ischemia however, there was an abnormal TID ratio. I was planning on arranging for her catheterization however she had some more chest pain symptoms and presented to [facility]. Her pain was relieved with nitroglycerin and she was referred for cardiac catheterization. This was performed on 06/18/2016 by Dr. Saunders Revel, which demonstrated up to 30% mild to moderate nonobstructive coronary disease. LVEDP was 5 mmHg, LV EF was 60-65%. It was recommended that she go on to a long-acting nitrate for possible small vessel coronary disease. She says that she's been taking this and still is required a couple extra sublingual  nitroglycerin. She's been under significant stress. Her son was killed a number of years ago and had another son who died recently. Think this is playing a big role in things.  09/28/2016  Peggy French returns today for follow-up. Overall she is doing well without any recurrent chest pain. Blood pressure today is well controlled at 126/78. She denies any chest pain or worsening shortness of breath. EKG shows sinus rhythm at 91. It seems like the carvedilol seems to be working better at rate control for her. She was also started on HCTZ which he is helpful for her blood pressure is well.  05/12/2017  Peggy French was seen today in follow-up.  She continues to be asymptomatic.  She denies chest pain or worsening shortness of breath.  Blood pressure is well-controlled today 124/82.  She is followed by Dr. Malachy Mood at the cancer center for anemia.  EKG today shows sinus rhythm at 84.  She occasionally gets some chest discomfort which is relieved by Tums. She is also had some shortness of breath with exertion however is transient and is not worsening.  11/02/2017  Peggy French returns today for follow-up.  Overall she seems to be doing well.  She has some occasional chest discomfort which is short-lived.  She is being followed up for cancer but has a low [name].  EKG shows sinus rhythm without any ischemic changes.  She denies any worsening shortness of breath.  She has been under a lot of stress recently.  Weight has been stable.  Blood  pressure is at goal.  She has not had recent lipid testing.  She also saw her primary recently for what sounds like a viral gastroenteritis.   Current Outpatient Medications  Medication Sig Dispense Refill  . acetaminophen (TYLENOL) 500 MG tablet Take 1,000 mg by mouth every 6 (six) hours as needed for mild pain or moderate pain. For pain    . allopurinol (ZYLOPRIM) 100 MG tablet Take 100 mg by mouth daily.  1  . amLODipine (NORVASC) 5 MG tablet Take 5 mg by mouth daily as needed. Only take when BP  readings are high    . aspirin EC 81 MG tablet Take 1 tablet (81 mg total) by mouth daily.    . carboxymethylcellulose (REFRESH PLUS) 0.5 % SOLN Place 2 drops into both eyes 3 (three) times daily as needed (dry eyes).     . carvedilol (COREG) 3.125 MG tablet TAKE 1 TABLET BY MOUTH 2 TIMES DAILY. 180 tablet 3  . Cyanocobalamin (B-12 PO) Take 1 tablet by mouth daily.    . diphenoxylate-atropine (LOMOTIL) 2.5-0.025 MG tablet Take 1 tablet by mouth 4 (four) times daily as needed for diarrhea or loose stools.    Marland Kitchen esomeprazole (NEXIUM) 40 MG capsule Take 40 mg by mouth every evening.    . fexofenadine (ALLEGRA) 180 MG tablet Take 180 mg by mouth daily. For Allergies    . gabapentin (NEURONTIN) 100 MG capsule Take 100 mg by mouth at bedtime.     . hydrochlorothiazide (HYDRODIURIL) 12.5 MG tablet Take 1 tablet (12.5 mg total) by mouth daily. 90 tablet 1  . isosorbide mononitrate (IMDUR) 30 MG 24 hr tablet TAKE 1 TABLET BY MOUTH EVERY DAY 30 tablet 6  . linagliptin (TRADJENTA) 5 MG TABS tablet Take 5 mg by mouth daily after breakfast.     . meclizine (ANTIVERT) 25 MG tablet Take 25 mg by mouth every 6 (six) hours as needed. AS NEEDED FOR DIZZINESS  0  . Melatonin 5 MG TABS Take 5 mg by mouth daily.    . Multiple Vitamin (MULTIVITAMIN) tablet Take 1 tablet by mouth daily.    . Multiple Vitamins-Minerals (WOMENS MULTIVITAMIN PO) Take by mouth daily.    . potassium chloride SA (KLOR-CON M20) 20 MEQ tablet Take 1 tablet (20 mEq total) by mouth daily. 30 tablet 2  . Probiotic Product (ALIGN PO) Take by mouth daily.    . ramipril (ALTACE) 5 MG capsule Take 5 mg by mouth daily with breakfast.     . simvastatin (ZOCOR) 40 MG tablet Take 40 mg by mouth at bedtime.     . vitamin C (ASCORBIC ACID) 500 MG tablet Take 500 mg by mouth daily.    . nitroGLYCERIN (NITROSTAT) 0.4 MG SL tablet Place 1 tablet (0.4 mg total) under the tongue every 5 (five) minutes as needed for chest pain. 25 tablet 3   No current  facility-administered medications for this visit.    Facility-Administered Medications Ordered in Other Visits  Medication Dose Route Frequency Provider Last Rate Last Dose  . sodium chloride 0.9 % injection 10 mL  10 mL Intracatheter PRN Ladell Pier, MD        Allergies  Allergen Reactions  . Aspirin Palpitations    Social History   Socioeconomic History  . Marital status: Married    Spouse name: Not on file  . Number of children: 4  . Years of education: Not on file  . Highest education level: Not on file  Occupational History  Employer: RETIRED  Social Needs  . Financial resource strain: Not on file  . Food insecurity:    Worry: Not on file    Inability: Not on file  . Transportation needs:    Medical: Not on file    Non-medical: Not on file  Tobacco Use  . Smoking status: Never Smoker  . Smokeless tobacco: Never Used  Substance and Sexual Activity  . Alcohol use: No  . Drug use: No  . Sexual activity: Not on file  Lifestyle  . Physical activity:    Days per week: Not on file    Minutes per session: Not on file  . Stress: Not on file  Relationships  . Social connections:    Talks on phone: Not on file    Gets together: Not on file    Attends religious service: Not on file    Active member of club or organization: Not on file    Attends meetings of clubs or organizations: Not on file    Relationship status: Not on file  . Intimate partner violence:    Fear of current or ex partner: Not on file    Emotionally abused: Not on file    Physically abused: Not on file    Forced sexual activity: Not on file  Other Topics Concern  . Not on file  Social History Narrative   Married to husband, Herbie [place]   Retired Engineer, manufacturing systems     Review of Systems: Pertinent items noted in HPI and remainder of comprehensive ROS otherwise negative.  Exam: Blood pressure 136/82, pulse 82, height 5\' 4"  (1.626 m), weight 226 lb 12.8 oz (102.9 kg).  General appearance:  alert, cooperative, no distress and morbidly obese Neck: no JVD Lungs: clear to auscultation bilaterally Heart: regular rate and rhythm Abdomen: obese, no obvious RLQ tenderness Extremities: extremities normal, atraumatic, no cyanosis or edema Pulses: 2+ and symmetric Skin: Skin color, texture, turgor normal. No rashes or lesions Neurologic: Grossly normal  EKG  Normal sinus rhythm 82, nonspecific T wave changes-personally reviewed  ASSESSMENT: 1. Mild, nonobstructive coronary disease 2. Essential hypertension 3. Dyslipidemia 4. Type 2 diabetes with neuropathy 5. Moderate obesity   PLAN: Peggy French seems to be doing well without any recurrent chest pain or worsening shortness of breath.  We will need to reassess her lipid profile to make sure she is being adequately treated.  Her diabetes was also recently assessed and believe A1c was 7.2.  She is moderately obese and needs to continue to work on weight loss and more physical activity.  Follow-up with me annually or sooner as necessary.  Pixie Casino, MD, [facility], [place] Director of the [facility]  [facility] Diplomate of the American Board of Clinical Lipidology Attending Cardiologist  Direct Dial: [phone]  Fax: [phone]  Website:  [email or web address] [email or web address]  11/02/2017 2:12 PM

## 2017-11-08 LAB — LIPID PANEL
CHOLESTEROL TOTAL: 151 mg/dL (ref 100–199)
Chol/HDL Ratio: 2.7 ratio (ref 0.0–4.4)
HDL: 56 mg/dL (ref >39)
LDL CALC: 78 mg/dL (ref 0–99)
TRIGLYCERIDES: 87 mg/dL (ref 0–149)
VLDL Cholesterol Cal: 17 mg/dL (ref 5–40)

## 2017-12-06 ENCOUNTER — Telehealth: Payer: Self-pay | Admitting: Oncology

## 2017-12-08 ENCOUNTER — Inpatient Hospital Stay: Payer: Medicare Other | Attending: Nurse Practitioner | Admitting: Nurse Practitioner

## 2017-12-08 ENCOUNTER — Inpatient Hospital Stay: Payer: Medicare Other

## 2017-12-08 ENCOUNTER — Encounter: Payer: Self-pay | Admitting: Nurse Practitioner

## 2017-12-08 ENCOUNTER — Telehealth: Payer: Self-pay | Admitting: Emergency Medicine

## 2017-12-08 VITALS — BP 147/94 | HR 83 | Temp 98.6°F | Resp 18 | Ht 64.0 in | Wt 225.9 lb

## 2017-12-08 DIAGNOSIS — C7800 Secondary malignant neoplasm of unspecified lung: Secondary | ICD-10-CM

## 2017-12-08 DIAGNOSIS — D509 Iron deficiency anemia, unspecified: Secondary | ICD-10-CM | POA: Insufficient documentation

## 2017-12-08 DIAGNOSIS — C182 Malignant neoplasm of ascending colon: Secondary | ICD-10-CM

## 2017-12-08 DIAGNOSIS — Z85038 Personal history of other malignant neoplasm of large intestine: Secondary | ICD-10-CM | POA: Diagnosis not present

## 2017-12-08 DIAGNOSIS — C189 Malignant neoplasm of colon, unspecified: Secondary | ICD-10-CM

## 2017-12-08 DIAGNOSIS — E119 Type 2 diabetes mellitus without complications: Secondary | ICD-10-CM

## 2017-12-08 LAB — CEA (IN HOUSE-CHCC): CEA (CHCC-IN HOUSE): 2.81 ng/mL (ref 0.00–5.00)

## 2017-12-08 NOTE — Telephone Encounter (Addendum)
Pt verbalized understanding of this.   ----- Message from Owens Shark, NP sent at 12/08/2017  1:46 PM EDT ----- Please let her know [name] is normal.  Follow-up as scheduled.

## 2017-12-08 NOTE — Progress Notes (Signed)
Peggy French   Diagnosis: Colon cancer  INTERVAL HISTORY:   Peggy French returns as scheduled.  No change in bowel habits.  She occasionally notes hemorrhoidal bleeding.  None recently.  She has a good appetite.  Objective:  Vital signs in last 24 hours:  Blood pressure (!) 147/94, pulse 83, temperature 98.6 F (37 C), temperature source Oral, resp. rate 18, height 5\' 4"  (1.626 m), weight 225 lb 14.4 oz (102.5 kg), SpO2 96 %.    HEENT: Neck without mass. Lymphatics: No palpable cervical, supraclavicular, axillary or inguinal lymph nodes. Resp: Lungs clear bilaterally. Cardio: Regular rate and rhythm. GI: Abdomen soft and nontender.  No hepatomegaly. Vascular: No leg edema.    Lab Results:  Lab Results  Component Value Date   WBC 6.5 06/19/2016   HGB 10.8 (L) 06/19/2016   HCT 33.0 (L) 06/19/2016   MCV 79.9 06/19/2016   PLT 203 06/19/2016   NEUTROABS 2.4 02/10/2015    Imaging:  No results found.  Medications: I have reviewed the patient's current medications.  Assessment/Plan: 1. 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 She began adjuvant CAPOX chemotherapy on 02/24/2012. She completed cycle 8 on 07/27/2012. Negative surveillance colonoscopy 12/08/2012 2. Delayed nausea following cycle 1 CAPOX. Aloxi was added beginning with cycle 2 with improvement. 3. Diabetes. 4. Microcytic anemia. Hemoglobin is normal. She will discontinue iron. 5. Remote history of a "colon tumor" removed endoscopically in 1980. 6. Left lower lung nodule noted on a CT 02/21/2012, slightly larger than on a CT 12/03/2011. Chest CT 06/12/2012 showed the previously seen 5 mm pulmonary nodule in left lower lobe was scarcely visualized, estimated at 3 mm. No other suspicious pulmonary nodules were identified. Chest CT 11/27/2012 showed interval enlargement of the left lower lobe pulmonary nodule with a current  measurement of 7 mm x 8 mm. No new pulmonary nodules. CT 02/22/2013 with enlargement of the anterior left lower lobe nodule and no other lung nodules. PET scan 03/02/2013 showed no associated hypermetabolism with the 12 mm left lower lobe pulmonary nodule. No hypermetabolic lymph nodes in the neck, chest, abdomen or pelvis. No abnormal hypermetabolic activity within the liver, pancreas, adrenal glands or spleen. Abnormal FDG accumulation in the posterior left nasopharyngeal mucosa.  Status post a wedge resection of the left lower lobe nodule on 03/27/2013 with the pathology confirming metastatic adenocarcinoma of colorectal primary.   Chest CT 07/18/2013-negative for recurrent disease.   Chest CT 09/19/2013 to evaluate for shortness of breath showed postoperative changes in the left lower lobe without evidence of recurrent or metastatic disease.  CT chest 04/01/2014 with postsurgical scarring in the left lower lobe. No evidence for metastatic disease in the chest. Stable appearance of mild subpleural reticulation of the lung bases.  CT chest/abdomen/pelvis 10/01/2014 with postoperative changes left lower lobe. No evidence of metastatic disease in the chest, abdomen or pelvis.  CT chest/abdomen/pelvis 11/03/2015 with no evidence of recurrent malignancy.  Colonoscopy 03/25/2016-tubular adenomas removed from the transverse colon and rectum  CT chest/abdomen/pelvis 06/17/2017-no evidence of recurrent malignancy. 7. Status post Port-A-Cath placement 02/22/2012. Status post Port-A-Cath repositioning 03/14/2012. Port-A-Cath removed 06/04/2014 8. Probable acute laryngopharyngeal dysesthesia following cycle 2 CAPOX. Symptoms resolved with warming.  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 She has persistent neuropathy symptoms involving the feet. Improved with Neurontin 10. History of anterior chest pain-she reports undergoing an evaluation by Dr. Debara Pickett. 11. Hand-foot syndrome  secondary to Xeloda-improved with a dose reduction  of Xeloda.     Disposition: Peggy French remains in clinical remission from colon cancer.  We will follow-up on the [name] from today.  She will return for a [name] and surveillance CT scans in approximately 6 months.  We will see her in follow-up a few days later to review the results.  She will contact the office in the interim with any problems.    Ned Card ANP/GNP-BC   12/08/2017  1:39 PM

## 2018-03-12 ENCOUNTER — Other Ambulatory Visit: Payer: Self-pay | Admitting: Internal Medicine

## 2018-03-14 ENCOUNTER — Other Ambulatory Visit: Payer: Self-pay

## 2018-03-14 MED ORDER — HYDROCHLOROTHIAZIDE 12.5 MG PO TABS: 12.5000 mg | ORAL_TABLET | Freq: Every day | ORAL | 4 refills | Status: DC

## 2018-04-05 ENCOUNTER — Other Ambulatory Visit: Payer: Self-pay | Admitting: Internal Medicine

## 2018-06-09 ENCOUNTER — Telehealth: Payer: Self-pay | Admitting: *Deleted

## 2018-06-09 ENCOUNTER — Inpatient Hospital Stay: Payer: Medicare Other | Attending: Oncology

## 2018-06-09 DIAGNOSIS — C7800 Secondary malignant neoplasm of unspecified lung: Secondary | ICD-10-CM

## 2018-06-09 DIAGNOSIS — Z85038 Personal history of other malignant neoplasm of large intestine: Secondary | ICD-10-CM | POA: Insufficient documentation

## 2018-06-09 DIAGNOSIS — C189 Malignant neoplasm of colon, unspecified: Secondary | ICD-10-CM

## 2018-06-09 LAB — CEA (IN HOUSE-CHCC): CEA (CHCC-In House): 3.56 ng/mL (ref 0.00–5.00)

## 2018-06-09 LAB — CMP (CANCER CENTER ONLY)
ALK PHOS: 83 U/L (ref 38–126)
ALT: 17 U/L (ref 0–44)
ANION GAP: 8 (ref 5–15)
AST: 22 U/L (ref 15–41)
Albumin: 3.3 g/dL — ABNORMAL LOW (ref 3.5–5.0)
BILIRUBIN TOTAL: 0.3 mg/dL (ref 0.3–1.2)
BUN: 13 mg/dL (ref 8–23)
CO2: 28 mmol/L (ref 22–32)
CREATININE: 1.1 mg/dL — AB (ref 0.44–1.00)
Calcium: 8.9 mg/dL (ref 8.9–10.3)
Chloride: 103 mmol/L (ref 98–111)
GFR, Est AFR Am: 56 mL/min — ABNORMAL LOW (ref >60)
GFR, Estimated: 49 mL/min — ABNORMAL LOW (ref >60)
GLUCOSE: 192 mg/dL — AB (ref 70–99)
Potassium: 4.4 mmol/L (ref 3.5–5.1)
SODIUM: 139 mmol/L (ref 135–145)
TOTAL PROTEIN: 7.4 g/dL (ref 6.5–8.1)

## 2018-06-09 NOTE — Telephone Encounter (Signed)
Called patient w/CT scan at [facility] on 06/14/18 at 1:30 pm--arrive at 1:00. NPO 4 hours prior and drink contrast at 1130 and 1230. Instructed her to pick up her contrast in radiology any day prior.

## 2018-06-11 ENCOUNTER — Other Ambulatory Visit: Payer: Self-pay | Admitting: Internal Medicine

## 2018-06-14 ENCOUNTER — Ambulatory Visit (HOSPITAL_COMMUNITY)
Admission: RE | Admit: 2018-06-14 | Discharge: 2018-06-14 | Disposition: A | Discharge status: Home / Self Care | Payer: Medicare Other | Source: Ambulatory Visit | Attending: Nurse Practitioner | Admitting: Nurse Practitioner

## 2018-06-14 DIAGNOSIS — C189 Malignant neoplasm of colon, unspecified: Secondary | ICD-10-CM | POA: Diagnosis not present

## 2018-06-14 DIAGNOSIS — C7800 Secondary malignant neoplasm of unspecified lung: Secondary | ICD-10-CM | POA: Diagnosis present

## 2018-06-14 MED ORDER — IOPAMIDOL (ISOVUE-300) INJECTION 61%
100.0000 mL | Freq: Once | INTRAVENOUS | Status: AC | PRN
  Administered 2018-06-14: 100 mL via INTRAVENOUS

## 2018-06-15 ENCOUNTER — Inpatient Hospital Stay: Payer: Medicare Other | Attending: Oncology | Admitting: Oncology

## 2018-06-15 ENCOUNTER — Telehealth: Payer: Self-pay

## 2018-06-15 VITALS — BP 167/86 | HR 92 | Temp 98.2°F | Resp 18 | Ht 64.0 in | Wt 227.0 lb

## 2018-06-15 DIAGNOSIS — C7800 Secondary malignant neoplasm of unspecified lung: Secondary | ICD-10-CM

## 2018-06-15 DIAGNOSIS — E119 Type 2 diabetes mellitus without complications: Secondary | ICD-10-CM | POA: Diagnosis not present

## 2018-06-15 DIAGNOSIS — Z85038 Personal history of other malignant neoplasm of large intestine: Secondary | ICD-10-CM | POA: Insufficient documentation

## 2018-06-15 DIAGNOSIS — C189 Malignant neoplasm of colon, unspecified: Secondary | ICD-10-CM

## 2018-06-15 NOTE — Telephone Encounter (Signed)
Printed avs and calender of upcoming appointment. Per 12/5 los

## 2018-06-15 NOTE — Telephone Encounter (Signed)
Printed vas and calender of upcoming appointment. Per 12/5 los

## 2018-06-15 NOTE — Progress Notes (Signed)
[facility] OFFICE PROGRESS NOTE   Diagnosis: Colon cancer  INTERVAL HISTORY:   Peggy French returns as scheduled.  She feels well.  Good appetite.  No difficulty with bowel function.  She has intermittent "hemorrhoid "bleeding.  No other complaint.  Objective:  Vital signs in last 24 hours:  Blood pressure (!) 167/86, pulse 92, temperature 98.2 F (36.8 C), temperature source Oral, resp. rate 18, height 5\' 4"  (1.626 m), weight 227 lb (103 kg), SpO2 98 %.    HEENT: Neck without mass Lymphatics: No cervical, supraclavicular, axillary, or inguinal nodes Resp: Lungs clear bilaterally Cardio: Regular rate and rhythm GI: No hepatosplenomegaly Vascular: No leg edema   Lab Results:  CMP  Lab Results  Component Value Date   NA 139 06/09/2018   K 4.4 06/09/2018   CL 103 06/09/2018   CO2 28 06/09/2018   GLUCOSE 192 (H) 06/09/2018   BUN 13 06/09/2018   CREATININE 1.10 (H) 06/09/2018   CALCIUM 8.9 06/09/2018   PROT 7.4 06/09/2018   ALBUMIN 3.3 (L) 06/09/2018   AST 22 06/09/2018   ALT 17 06/09/2018   ALKPHOS 83 06/09/2018   BILITOT 0.3 06/09/2018   GFRNONAA 49 (L) 06/09/2018   GFRAA 56 (L) 06/09/2018    Lab Results  Component Value Date   [name]1 3.56 06/09/2018     Imaging:  Ct Chest W Contrast  Result Date: 06/15/2018 CLINICAL DATA:  Recurrent metastatic colon cancer, status post right hemicolectomy, status post left lower lobe wedge resection, status post chemotherapy. Status post hysterectomy, cholecystectomy, and appendectomy. EXAM: CT CHEST, ABDOMEN, AND PELVIS WITH CONTRAST TECHNIQUE: Multidetector CT imaging of the chest, abdomen and pelvis was performed following the standard protocol during bolus administration of intravenous contrast. CONTRAST:  135mL ISOVUE-300 IOPAMIDOL (ISOVUE-300) INJECTION 61% COMPARISON:  06/17/2017 FINDINGS: CT CHEST FINDINGS Cardiovascular: The heart is normal in size. No pericardial effusion. No evidence of thoracic  aortic aneurysm. Mild atherosclerotic calcifications aortic root/arch. Mild coronary atherosclerosis of the LAD. Mediastinum/Nodes: Small mediastinal lymph nodes, including a 6 mm short axis low right paratracheal node. No suspicious mediastinal lymphadenopathy. Mild thyromegaly. Lungs/Pleura: Status post left lower lobe wedge resection. No suspicious pulmonary nodules. Mild subpleural reticulation/fibrosis with ground-glass opacity/mosaic attenuation in the lungs bilaterally, favoring mild chronic interstitial lung disease. No pleural effusion or pneumothorax. Musculoskeletal: Degenerative changes of the thoracic spine. CT ABDOMEN PELVIS FINDINGS Hepatobiliary: Liver is within normal limits. No suspicious/enhancing hepatic lesions. Status post cholecystectomy. No intrahepatic or extrahepatic ductal dilatation. Pancreas: Within normal limits. Spleen: Within normal limits. Adrenals/Urinary Tract: Adrenal glands within normal limits. Kidneys are within normal limits.  No hydronephrosis. Bladder is within normal limits. Stomach/Bowel: Stomach is notable for a small hiatal hernia. No evidence of bowel obstruction. Status post right hemicolectomy with appendectomy. No colonic wall thickening or mass is evident on CT. Vascular/Lymphatic: No evidence of abdominal aortic aneurysm. Atherosclerotic calcifications of the abdominal aorta and branch vessels. No suspicious abdominopelvic lymphadenopathy. Reproductive: Status post hysterectomy. Bilateral ovaries are unremarkable. Other: No abdominopelvic ascites. Musculoskeletal: Mild degenerative changes the lumbar spine. IMPRESSION: Status post right hemicolectomy and left lower lobe wedge resection. No evidence of recurrent or metastatic disease. Additional stable postsurgical and ancillary findings as above. Electronically Signed   By: Julian Hy M.D.   On: 06/15/2018 07:33   Ct Abdomen Pelvis W Contrast  Result Date: 06/15/2018 CLINICAL DATA:  Recurrent metastatic  colon cancer, status post right hemicolectomy, status post left lower lobe wedge resection, status post chemotherapy. Status post hysterectomy,  cholecystectomy, and appendectomy. EXAM: CT CHEST, ABDOMEN, AND PELVIS WITH CONTRAST TECHNIQUE: Multidetector CT imaging of the chest, abdomen and pelvis was performed following the standard protocol during bolus administration of intravenous contrast. CONTRAST:  139mL ISOVUE-300 IOPAMIDOL (ISOVUE-300) INJECTION 61% COMPARISON:  06/17/2017 FINDINGS: CT CHEST FINDINGS Cardiovascular: The heart is normal in size. No pericardial effusion. No evidence of thoracic aortic aneurysm. Mild atherosclerotic calcifications aortic root/arch. Mild coronary atherosclerosis of the LAD. Mediastinum/Nodes: Small mediastinal lymph nodes, including a 6 mm short axis low right paratracheal node. No suspicious mediastinal lymphadenopathy. Mild thyromegaly. Lungs/Pleura: Status post left lower lobe wedge resection. No suspicious pulmonary nodules. Mild subpleural reticulation/fibrosis with ground-glass opacity/mosaic attenuation in the lungs bilaterally, favoring mild chronic interstitial lung disease. No pleural effusion or pneumothorax. Musculoskeletal: Degenerative changes of the thoracic spine. CT ABDOMEN PELVIS FINDINGS Hepatobiliary: Liver is within normal limits. No suspicious/enhancing hepatic lesions. Status post cholecystectomy. No intrahepatic or extrahepatic ductal dilatation. Pancreas: Within normal limits. Spleen: Within normal limits. Adrenals/Urinary Tract: Adrenal glands within normal limits. Kidneys are within normal limits.  No hydronephrosis. Bladder is within normal limits. Stomach/Bowel: Stomach is notable for a small hiatal hernia. No evidence of bowel obstruction. Status post right hemicolectomy with appendectomy. No colonic wall thickening or mass is evident on CT. Vascular/Lymphatic: No evidence of abdominal aortic aneurysm. Atherosclerotic calcifications of the abdominal  aorta and branch vessels. No suspicious abdominopelvic lymphadenopathy. Reproductive: Status post hysterectomy. Bilateral ovaries are unremarkable. Other: No abdominopelvic ascites. Musculoskeletal: Mild degenerative changes the lumbar spine. IMPRESSION: Status post right hemicolectomy and left lower lobe wedge resection. No evidence of recurrent or metastatic disease. Additional stable postsurgical and ancillary findings as above. Electronically Signed   By: Julian Hy M.D.   On: 06/15/2018 07:33    Medications: I have reviewed the patient's current medications.   Assessment/Plan: 1. 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 She began adjuvant CAPOX chemotherapy on 02/24/2012. She completed cycle 8 on 07/27/2012. Negative surveillance colonoscopy 12/08/2012 2. Delayed nausea following cycle 1 CAPOX. Aloxi was added beginning with cycle 2 with improvement. 3. Diabetes. 4. Microcytic anemia. Hemoglobin is normal. She will discontinue iron. 5. Remote history of a "colon tumor" removed endoscopically in 1980. 6. Left lower lung nodule noted on a CT 02/21/2012, slightly larger than on a CT 12/03/2011. Chest CT 06/12/2012 showed the previously seen 5 mm pulmonary nodule in left lower lobe was scarcely visualized, estimated at 3 mm. No other suspicious pulmonary nodules were identified. Chest CT 11/27/2012 showed interval enlargement of the left lower lobe pulmonary nodule with a current measurement of 7 mm x 8 mm. No new pulmonary nodules. CT 02/22/2013 with enlargement of the anterior left lower lobe nodule and no other lung nodules. PET scan 03/02/2013 showed no associated hypermetabolism with the 12 mm left lower lobe pulmonary nodule. No hypermetabolic lymph nodes in the neck, chest, abdomen or pelvis. No abnormal hypermetabolic activity within the liver, pancreas, adrenal glands or spleen. Abnormal FDG accumulation in the posterior left nasopharyngeal  mucosa.  Status post a wedge resection of the left lower lobe nodule on 03/27/2013 with the pathology confirming metastatic adenocarcinoma of colorectal primary.   Chest CT 07/18/2013-negative for recurrent disease.   Chest CT 09/19/2013 to evaluate for shortness of breath showed postoperative changes in the left lower lobe without evidence of recurrent or metastatic disease.  CT chest 04/01/2014 with postsurgical scarring in the left lower lobe. No evidence for metastatic disease in the chest. Stable appearance of mild  subpleural reticulation of the lung bases.  CT chest/abdomen/pelvis 10/01/2014 with postoperative changes left lower lobe. No evidence of metastatic disease in the chest, abdomen or pelvis.  CT chest/abdomen/pelvis 11/03/2015 with no evidence of recurrent malignancy.  Colonoscopy 03/25/2016-tubular adenomas removed from the transverse colon and rectum  CT chest/abdomen/pelvis 06/17/2017-no evidence of recurrent malignancy.  CT chest/abdomen/pelvis 06/14/2018-no evidence of recurrent disease 7. Status post Port-A-Cath placement 02/22/2012. Status post Port-A-Cath repositioning 03/14/2012. Port-A-Cath removed 06/04/2014 8. Probable acute laryngopharyngeal dysesthesia following cycle 2 CAPOX. Symptoms resolved with warming.  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 She has persistent neuropathy symptoms involving the feet. Improved with Neurontin 10. History of anterior chest pain-she reports undergoing an evaluation by Dr. Debara Pickett. 11. 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     Disposition: Peggy French remains in clinical remission from colon cancer.  She will return for an office visit and [name] in 6 months.  She continues colonoscopy follow-up with Dr. Laural Golden.  We will decide on ordering additional surveillance CTs when she returns in 6 months.  15 minutes were spent with the patient today.  The majority of  the time was used for counseling and coordination of care.  [name], MD  06/15/2018  1:01 PM

## 2018-07-08 ENCOUNTER — Other Ambulatory Visit: Payer: Self-pay | Admitting: Internal Medicine

## 2018-10-27 ENCOUNTER — Telehealth: Payer: Self-pay | Admitting: *Deleted

## 2018-10-27 NOTE — Telephone Encounter (Signed)
Patient call

## 2018-11-06 ENCOUNTER — Ambulatory Visit: Payer: Medicare Other | Admitting: Internal Medicine

## 2018-12-14 ENCOUNTER — Inpatient Hospital Stay: Payer: Medicare Other

## 2018-12-14 ENCOUNTER — Telehealth: Payer: Self-pay

## 2018-12-14 ENCOUNTER — Inpatient Hospital Stay: Payer: Medicare Other | Admitting: Nurse Practitioner

## 2018-12-14 NOTE — Telephone Encounter (Signed)
TC to patient per Lattie Haw to let her know that appointment today with Lattie Haw is cancelled and will be pushed out a month. Patient ok with changes. No further problems or concerns at this time.

## 2018-12-15 ENCOUNTER — Telehealth: Payer: Self-pay | Admitting: Nurse Practitioner

## 2018-12-15 NOTE — Telephone Encounter (Signed)
Scheduled appt per sch msg. Called and spoke with patient. Confirmed date and time °

## 2019-01-15 ENCOUNTER — Other Ambulatory Visit: Payer: Self-pay

## 2019-01-15 ENCOUNTER — Encounter: Payer: Self-pay | Admitting: Nurse Practitioner

## 2019-01-15 ENCOUNTER — Telehealth: Payer: Self-pay | Admitting: Nurse Practitioner

## 2019-01-15 ENCOUNTER — Inpatient Hospital Stay: Payer: Medicare Other

## 2019-01-15 ENCOUNTER — Inpatient Hospital Stay: Payer: Medicare Other | Attending: Nurse Practitioner | Admitting: Nurse Practitioner

## 2019-01-15 VITALS — BP 144/77 | HR 100 | Temp 99.1°F | Resp 18 | Ht 64.0 in | Wt 220.9 lb

## 2019-01-15 DIAGNOSIS — C7800 Secondary malignant neoplasm of unspecified lung: Secondary | ICD-10-CM

## 2019-01-15 DIAGNOSIS — Z85038 Personal history of other malignant neoplasm of large intestine: Secondary | ICD-10-CM

## 2019-01-15 DIAGNOSIS — C189 Malignant neoplasm of colon, unspecified: Secondary | ICD-10-CM

## 2019-01-15 DIAGNOSIS — D509 Iron deficiency anemia, unspecified: Secondary | ICD-10-CM | POA: Diagnosis not present

## 2019-01-15 DIAGNOSIS — E119 Type 2 diabetes mellitus without complications: Secondary | ICD-10-CM | POA: Diagnosis not present

## 2019-01-15 LAB — CEA (IN HOUSE-CHCC): CEA (CHCC-In House): 3.39 ng/mL (ref 0.00–5.00)

## 2019-01-15 NOTE — Telephone Encounter (Signed)
Gave avs and calendar ° °

## 2019-01-15 NOTE — Progress Notes (Signed)
[facility] OFFICE PROGRESS NOTE   Diagnosis: Colon cancer  INTERVAL HISTORY:   Ms. Cocking returns as scheduled.  She feels well.  No change in bowel habits.  About a month ago she noted a small amount of bright red blood with a bowel movement.  She thinks this was due to hemorrhoids.  No abdominal pain.  She has a good appetite.  She reports her weight is stable.  She denies a fever.  She has an occasional cough which she attributes to allergies.  No shortness of breath.  Objective:  Vital signs in last 24 hours:  Blood pressure (!) 144/77, pulse 100, temperature 99.1 F (37.3 C), temperature source Oral, resp. rate 18, height 5\' 4"  (1.626 m), weight 220 lb 14.4 oz (100.2 kg), SpO2 99 %.    HEENT: Neck without mass. Lymphatics: No palpable cervical, supraclavicular, axillary or inguinal lymph nodes. Resp: Lungs clear bilaterally. Cardio: Regular rate and rhythm. GI: Abdomen soft and nontender.  No hepatomegaly.  No mass. Vascular: No leg edema.   Lab Results:  Lab Results  Component Value Date   WBC 6.5 06/19/2016   HGB 10.8 (L) 06/19/2016   HCT 33.0 (L) 06/19/2016   MCV 79.9 06/19/2016   PLT 203 06/19/2016   NEUTROABS 2.4 02/10/2015    Imaging:  No results found.  Medications: I have reviewed the patient's current medications.  Assessment/Plan: 1. 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 She began adjuvant CAPOX chemotherapy on 02/24/2012. She completed cycle 8 on 07/27/2012. Negative surveillance colonoscopy 12/08/2012 2. Delayed nausea following cycle 1 CAPOX. Aloxi was added beginning with cycle 2 with improvement. 3. Diabetes. 4. Microcytic anemia. Hemoglobin is normal. She will discontinue iron. 5. Remote history of a "colon tumor" removed endoscopically in 1980. 6. Left lower lung nodule noted on a CT 02/21/2012, slightly larger than on a CT 12/03/2011. Chest CT 06/12/2012 showed the previously  seen 5 mm pulmonary nodule in left lower lobe was scarcely visualized, estimated at 3 mm. No other suspicious pulmonary nodules were identified. Chest CT 11/27/2012 showed interval enlargement of the left lower lobe pulmonary nodule with a current measurement of 7 mm x 8 mm. No new pulmonary nodules. CT 02/22/2013 with enlargement of the anterior left lower lobe nodule and no other lung nodules. PET scan 03/02/2013 showed no associated hypermetabolism with the 12 mm left lower lobe pulmonary nodule. No hypermetabolic lymph nodes in the neck, chest, abdomen or pelvis. No abnormal hypermetabolic activity within the liver, pancreas, adrenal glands or spleen. Abnormal FDG accumulation in the posterior left nasopharyngeal mucosa.  Status post a wedge resection of the left lower lobe nodule on 03/27/2013 with the pathology confirming metastatic adenocarcinoma of colorectal primary.   Chest CT 07/18/2013-negative for recurrent disease.   Chest CT 09/19/2013 to evaluate for shortness of breath showed postoperative changes in the left lower lobe without evidence of recurrent or metastatic disease.  CT chest 04/01/2014 with postsurgical scarring in the left lower lobe. No evidence for metastatic disease in the chest. Stable appearance of mild subpleural reticulation of the lung bases.  CT chest/abdomen/pelvis 10/01/2014 with postoperative changes left lower lobe. No evidence of metastatic disease in the chest, abdomen or pelvis.  CT chest/abdomen/pelvis 11/03/2015 with no evidence of recurrent malignancy.  Colonoscopy 03/25/2016-tubular adenomas removed from the transverse colon and rectum  CT chest/abdomen/pelvis 06/17/2017-no evidence of recurrent malignancy.  CT chest/abdomen/pelvis 06/14/2018-no evidence of recurrent disease 7. Status post Port-A-Cath placement 02/22/2012. Status  post Port-A-Cath repositioning 03/14/2012. Port-A-Cath removed 06/04/2014 8. Probable acute laryngopharyngeal dysesthesia  following cycle 2 CAPOX. Symptoms resolved with warming.  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 She has persistent neuropathy symptoms involving the feet. Improved with Neurontin 10. History of anterior chest pain-she reports undergoing an evaluation by Dr. Debara Pickett. 11. 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     Disposition: Ms. Alcott remains in clinical remission from colon cancer.  We will follow-up on the [name] from today.  She will undergo CTs in approximately 6 months.  We will see her a few days after the CT to review the results.  She will contact the office in the interim with any problems.      Ned Card ANP/GNP-BC   01/15/2019  2:25 PM

## 2019-01-16 ENCOUNTER — Telehealth: Payer: Self-pay

## 2019-01-16 NOTE — Telephone Encounter (Signed)
TC to pt per Lattie Haw to let her know that the [name] is stable in normal range. Pt verbalized understanding. No further problems or concerns at this time.

## 2019-04-26 ENCOUNTER — Telehealth: Payer: Self-pay | Admitting: Internal Medicine

## 2019-04-26 MED ORDER — NITROGLYCERIN 0.4 MG SL SUBL: 0.4000 mg | SUBLINGUAL_TABLET | SUBLINGUAL | 3 refills | Status: DC | PRN

## 2019-04-26 NOTE — Telephone Encounter (Signed)
If she requires more nitro, then would recommend that she go to the ER.  Dr Lemmie Evens

## 2019-04-26 NOTE — Telephone Encounter (Signed)
S/w pt she states that this morning @2am  she states that she had Chest pain and heaviness in her chest. She took an outdated nitro and this did alleviate the pain. And it has not returned she states that she wanted to call and let Dr C know that this happened. She states that she sleeps with 3 pillows because when she does not she wakes up in the middle of the night coughing. She was not coughing when she woke up with this chest pain.. She states that she takes all her medications as ordered. Her BP has been fine lately yesterday it was 118/70's.  She has appt with Isaac Laud on Tuesday, ok to wait?

## 2019-04-26 NOTE — Telephone Encounter (Signed)
Noted she will go to the Er if this happens again

## 2019-04-30 ENCOUNTER — Telehealth: Payer: Self-pay | Admitting: Physician Assistant

## 2019-04-30 NOTE — Telephone Encounter (Signed)
Will route to primary nurse to see if she is aware of any EKG- also to primary for appointment tomorrow.

## 2019-04-30 NOTE — Telephone Encounter (Signed)
Patient has appointment with Isaac Laud PA 05/01/2019 At time of message, no EKG was received in Dr. Lysbeth Penner basket

## 2019-04-30 NOTE — Telephone Encounter (Signed)
Paperwork received. Given to primary for visit tomorrow

## 2019-04-30 NOTE — Telephone Encounter (Signed)
New Message    Patient states Dr. Berdine Addison sent EKG over and she wants to make sure it was received.  Give patient a call back to discuss.

## 2019-05-01 ENCOUNTER — Telehealth (INDEPENDENT_AMBULATORY_CARE_PROVIDER_SITE_OTHER): Payer: Medicare Other | Admitting: Physician Assistant

## 2019-05-01 ENCOUNTER — Encounter: Payer: Self-pay | Admitting: Physician Assistant

## 2019-05-01 VITALS — BP 145/80 | HR 100 | Ht 64.5 in

## 2019-05-01 DIAGNOSIS — Z01810 Encounter for preprocedural cardiovascular examination: Secondary | ICD-10-CM

## 2019-05-01 DIAGNOSIS — E782 Mixed hyperlipidemia: Secondary | ICD-10-CM

## 2019-05-01 DIAGNOSIS — R072 Precordial pain: Secondary | ICD-10-CM | POA: Diagnosis not present

## 2019-05-01 DIAGNOSIS — E119 Type 2 diabetes mellitus without complications: Secondary | ICD-10-CM

## 2019-05-01 DIAGNOSIS — I25119 Atherosclerotic heart disease of native coronary artery with unspecified angina pectoris: Secondary | ICD-10-CM

## 2019-05-01 DIAGNOSIS — I10 Essential (primary) hypertension: Secondary | ICD-10-CM

## 2019-05-01 NOTE — Progress Notes (Signed)
Virtual Visit via Telephone Note   This visit type was conducted due to national recommendations for restrictions regarding the COVID-19 Pandemic (e.g. social distancing) in an effort to limit this patient's exposure and mitigate transmission in our community.  Due to her co-morbid illnesses, this patient is at least at moderate risk for complications without adequate follow up.  This format is felt to be most appropriate for this patient at this time.  The patient did not have access to video technology/had technical difficulties with video requiring transitioning to audio format only (telephone).  All issues noted in this document were discussed and addressed.  No physical exam could be performed with this format.  Please refer to the patient's chart for her  consent to telehealth for [facility].   Date:  05/03/2019   ID:  Peggy French, DOB [birth year], MRN [id number]  Patient Location: Home Provider Location: Home  PCP:  Iona Beard, MD  Cardiologist:  Pixie Casino, MD  Electrophysiologist:  None   Evaluation Performed:  Follow-Up Visit  Chief Complaint:  Chest pain  History of Present Illness:    Peggy French is a 78 y.o. female with past medical history of HTN, HLD, DM 2, obesity and history of mild CAD by cath in 2001 and 2012.  She had recurrent chest pain September 2016 and had CTA, Myoview and echocardiogram that were unremarkable.  She was seen in the emergency room again in September 2017 and was ruled out for MI.  Due to recurrent chest discomfort, she had Myoview in December 2017, this showed EF 58%, no ischemia, however abnormal TID ratio of 1.52.  Before cardiac catheterization was recommended, patient presented to [facility] with recurrent chest pain.  Repeat cardiac catheterization in December 2017 showed mild to moderate nonobstructive disease up to 30% involving LAD, left circumflex and RCA, EF 60 to 65%.  It was recommended for the patient to go on  long-acting nitrate for possible small vessel coronary artery disease.  Her last follow-up with Dr. Debara Pickett was in April 2019 at which time she was doing well.  Patient started having intermittent chest tightness several weeks ago.  Last week, she started having worsening episode and sought medical attention at her PCPs office.  Troponin and CK-MB were negative.  EKG was obtained, however this was not available to me.  She says Dr. Berdine Addison, her primary care provider has increased her Imdur to 30 mg twice daily.  She has not had to use another nitroglycerin since Saturday.  She does notice worsening shortness of breath with exertion.  Given the fact that she had very mild disease in her coronary artery in December 2017, I recommended a coronary CT to evaluate her recent chest discomfort.  The patient does not have symptoms concerning for COVID-19 infection (fever, chills, cough, or new shortness of breath).    Past Medical History:  Diagnosis Date   Asthmatic bronchitis 2011   Colon carcinoma metastatic to lung ([place]) 03/27/2013   a. s/p right hemicolectomy/chemo 2013 with mets to the lung s/p LLL wedge resection 2014.   Diabetes mellitus    x over 10 yrs   GERD (gastroesophageal reflux disease)    Gout    H/O hiatal hernia    History of colon cancer    History of shingles    Hyperlipidemia    Hypertension    EKG 10/12 EPIC, chest- 1 view  6/13 EPIC    Last 2D Echo on 05/02/2012 showed  EF of greater than 55%   Microcytic anemia    Mild CAD    a. cath by Dr. Debara Pickett in 03/2011 - 20-30% ostial bifurcation stenosis of LAD, otherwise only mild luminal irregularities in LAD/RCA, LVEF 60%, no RWMA.   c. 06/18/16 LHC after abnormal nuc showed mild non obst CAD   Neuropathy    Obesity    Past Surgical History:  Procedure Laterality Date   ABDOMINAL HYSTERECTOMY     APPENDECTOMY     CARDIAC CATHETERIZATION  2001,2012   CARDIAC CATHETERIZATION N/A 06/18/2016   Procedure: Left Heart Cath  and Coronary Angiography;  Surgeon: Nelva Bush, MD;  Location: [facility];  Service: Cardiovascular;  Laterality: N/A;   CATARACT EXTRACTION W/PHACO Left 02/17/2015   Procedure: CATARACT EXTRACTION PHACO AND INTRAOCULAR LENS PLACEMENT (IOC);  Surgeon: Tonny Branch, MD;  Location: [facility];  Service: Ophthalmology;  Laterality: Left;  CDE:8.01    CHOLECYSTECTOMY     COLON SURGERY     PARTIAL COLECTOMY 30 YRS / AGAIN 2013   COLONOSCOPY  11/26/2011   Procedure: COLONOSCOPY;  Surgeon: Rogene Houston, MD;  Location: [facility];  Service: Endoscopy;  Laterality: N/A;  2:15   COLONOSCOPY N/A 12/08/2012   Procedure: COLONOSCOPY;  Surgeon: Rogene Houston, MD;  Location: [facility];  Service: Endoscopy;  Laterality: N/A;  815-moved to [place] to notify pt   COLONOSCOPY N/A 03/25/2016   Procedure: COLONOSCOPY;  Surgeon: Rogene Houston, MD;  [facility];  Service: Endoscopy;  Laterality: N/A;  1200   ESOPHAGOGASTRODUODENOSCOPY  06/16/2011   Procedure: ESOPHAGOGASTRODUODENOSCOPY (EGD);  Surgeon: Rogene Houston, MD;  [facility];  Service: Endoscopy;  Laterality: N/A;  2:15   PORT-A-CATH REMOVAL N/A 06/04/2014   Procedure: REMOVAL PORT-A-CATH;  Surgeon: Pedro Earls, MD;  Location: [facility];  Service: General;  Laterality: N/A;   PORTACATH PLACEMENT  02/22/2012   Procedure: INSERTION PORT-A-CATH;  Surgeon: Pedro Earls, MD;  Location: [facility];  Service: General;  Laterality: N/A;  left subclavian   VIDEO ASSISTED THORACOSCOPY (VATS)/WEDGE RESECTION Left 03/27/2013   Procedure: VIDEO ASSISTED THORACOSCOPY (VATS)/WEDGE RESECTION;  Surgeon: Grace Isaac, MD;  Location: [facility];  Service: Thoracic;  Laterality: Left;   VIDEO BRONCHOSCOPY N/A 03/27/2013   Procedure: VIDEO BRONCHOSCOPY;  Surgeon: Grace Isaac, MD;  Location: [facility];  Service: Thoracic;  Laterality: N/A;     Current Meds  Medication Sig   acetaminophen (TYLENOL) 500 MG tablet Take  1,000 mg by mouth every 6 (six) hours as needed for mild pain or moderate pain. For pain   allopurinol (ZYLOPRIM) 100 MG tablet Take 100 mg by mouth daily.   aspirin EC 81 MG tablet Take 1 tablet (81 mg total) by mouth daily.   carboxymethylcellulose (REFRESH PLUS) 0.5 % SOLN Place 2 drops into both eyes 3 (three) times daily as needed (dry eyes).    carvedilol (COREG) 3.125 MG tablet TAKE 1 TABLET BY MOUTH TWICE A DAY   COLCRYS 0.6 MG tablet Take 0.6 mg by mouth daily.   Cyanocobalamin (B-12 PO) Take 1 tablet by mouth daily.   diphenoxylate-atropine (LOMOTIL) 2.5-0.025 MG tablet Take 1 tablet by mouth 4 (four) times daily as needed for diarrhea or loose stools.   esomeprazole (NEXIUM) 40 MG capsule Take 40 mg by mouth every evening.   gabapentin (NEURONTIN) 100 MG capsule Take 100 mg by mouth at bedtime.    hydrochlorothiazide (HYDRODIURIL) 12.5 MG tablet Take  1 tablet (12.5 mg total) by mouth daily.   isosorbide mononitrate (IMDUR) 30 MG 24 hr tablet TAKE 1 TABLET BY MOUTH EVERY DAY (Patient taking differently: Take 30 mg by mouth 2 (two) times daily. )   linagliptin (TRADJENTA) 5 MG TABS tablet Take 5 mg by mouth daily after breakfast.    Melatonin 5 MG TABS Take 5 mg by mouth as needed.    nitroGLYCERIN (NITROSTAT) 0.4 MG SL tablet Place 1 tablet (0.4 mg total) under the tongue every 5 (five) minutes as needed for chest pain.   potassium chloride SA (KLOR-CON M20) 20 MEQ tablet Take 1 tablet (20 mEq total) by mouth daily.   ramipril (ALTACE) 5 MG capsule Take 5 mg by mouth daily with breakfast.    simvastatin (ZOCOR) 40 MG tablet Take 40 mg by mouth at bedtime.    vitamin C (ASCORBIC ACID) 500 MG tablet Take 500 mg by mouth daily.     Allergies:   Aspirin   Social History   Tobacco Use   Smoking status: Never Smoker   Smokeless tobacco: Never Used  Substance Use Topics   Alcohol use: No   Drug use: No     Family Hx: The patient's family history includes  Cancer in her maternal aunt.  ROS:   Please see the history of present illness.     All other systems reviewed and are negative.   Prior CV studies:   The following studies were reviewed today:  Cath 06/18/2016 Conclusions: 1. Mild to moderate non-obstructive coronary artery disease of up to 30% involving the LAD, LCx, and RCA. 2. Low normal left ventricular filling pressure (LVEDP 5 mmHg). 3. Normal left ventricular contraction (LVEF 60-65%).  Recommendations: 1. Discontinue nitroglycerin infusion and start isosorbide mononitrate 30 mg daily for possible microvascular disease. Continue amlodipine and propranolol. 2. IV hydration given low normal LVEDP. 3. Continue risk factor modification, including statin therapy  Labs/Other Tests and Data Reviewed:    EKG:  An ECG dated 11/02/2017 was personally reviewed today and demonstrated:  Normal sinus rhythm without significant ST-T wave changes  Recent Labs: 06/09/2018: ALT 17; BUN 13; Creatinine 1.10; Potassium 4.4; Sodium 139   Recent Lipid Panel Lab Results  Component Value Date/Time   CHOL 151 11/07/2017 08:42 AM   TRIG 87 11/07/2017 08:42 AM   HDL 56 11/07/2017 08:42 AM   CHOLHDL 2.7 11/07/2017 08:42 AM   CHOLHDL 3.3 06/17/2015 05:02 AM   LDLCALC 78 11/07/2017 08:42 AM    Wt Readings from Last 3 Encounters:  01/15/19 220 lb 14.4 oz (100.2 kg)  06/15/18 227 lb (103 kg)  12/08/17 225 lb 14.4 oz (102.5 kg)     Objective:    Vital Signs:  BP (!) 145/80    Pulse 100    Ht 5' 4.5" (1.638 m)    BMI 37.33 kg/m    VITAL SIGNS:  reviewed  ASSESSMENT & PLAN:    1. Precordial chest pain: Given the fact that she had mild coronary artery disease in 2017, I recommended a coronary CT.  2. CAD: On aspirin and Zocor.  Her last cardiac catheterization was in 2017.  3. Hypertension: Blood pressure mildly elevated today.  Blood pressure improving when compared to the previous office visit.  I recommended continue on the current  therapy and reassess follow-up.  4. Hyperlipidemia: On Zocor  5. DM2: Managed by primary care provider  COVID-19 Education: The signs and symptoms of COVID-19 were discussed with the patient and how  to seek care for testing (follow up with PCP or arrange E-visit).  The importance of social distancing was discussed today.  Time:   Today, I have spent 17 minutes with the patient with telehealth technology discussing the above problems.     Medication Adjustments/Labs and Tests Ordered: Current medicines are reviewed at length with the patient today.  Concerns regarding medicines are outlined above.   Tests Ordered: Orders Placed This Encounter  Procedures   CT CORONARY MORPH W/CTA COR W/SCORE W/CA W/CM &/OR WO/CM   CT CORONARY FRACTIONAL FLOW RESERVE DATA PREP   CT CORONARY FRACTIONAL FLOW RESERVE FLUID ANALYSIS   Basic metabolic panel    Medication Changes: Meds ordered this encounter  Medications   metoprolol tartrate (LOPRESSOR) 50 MG tablet    Sig: Take 2 hours prior to procedure    Dispense:  1 tablet    Refill:  0    Follow Up:  In Person in 1 month(s)  Signed, [name], [place]  05/03/2019 12:17 AM    [facility]

## 2019-05-01 NOTE — Patient Instructions (Addendum)
Medication Instructions:   Continue to take Isosorbide Mononitrate (Imdur) 30 mg 2 times a day as directed by your Primary Care Doctor    Your physician recommends that you continue on your current medications as directed. Please refer to the Current Medication list given to you today.  *If you need a refill on your cardiac medications before your next appointment, please call your pharmacy*  Lab Work: You will need to have lab (blood work) drawn at least 1 week before your [place] If you have labs (blood work) drawn today and your tests are completely normal, you will receive your results only by: Marland Kitchen MyChart Message (if you have MyChart) OR . A paper copy in the mail If you have any lab test that is abnormal or we need to change your treatment, we will call you to review the results.  Testing/Procedures: Non-Cardiac CT Angiography (CTA), is a special type of CT scan that uses a computer to produce multi-dimensional views of major blood vessels throughout the body. In CT angiography, a contrast material is injected through an IV to help visualize the blood vessels   Please schedule for 2 weeks   Follow-Up: At [facility], you and your health needs are our priority.  As part of our continuing mission to provide you with exceptional heart care, we have created designated Provider Care Teams.  These Care Teams include your primary Cardiologist (physician) and Advanced Practice Providers (APPs -  Physician Assistants and Nurse Practitioners) who all work together to provide you with the care you need, when you need it.  Your next appointment:   You will need to follow up in 1 month after your CTA   The format for your next appointment:   In Person  Provider:   You may see Pixie Casino, MD or [name], PA-C   Other Instructions   Your cardiac CT will be scheduled at one of the below locations:   [facility] [address] [place], [place]  [address] [phone]  [place] [address] [place], [place] [address] [phone]  If scheduled at [facility], please arrive at the [facility] main entrance of [facility] 30-45 minutes prior to test start time. Proceed to the [facility] Radiology Department (first floor) to check-in and test prep.  If scheduled at [facility], please arrive 15 mins early for check-in and test prep.  Please follow these instructions carefully (unless otherwise directed):  Hold all erectile dysfunction medications at least 3 days (72 hrs) prior to test.  On the Night Before the Test: . Be sure to Drink plenty of water. . Do not consume any caffeinated/decaffeinated beverages or chocolate 12 hours prior to your test. . Do not take any antihistamines 12 hours prior to your test. . If the patient has contrast allergy: ? Patient will need a prescription for Prednisone and very clear instructions (as follows): 1. Prednisone 50 mg - take 13 hours prior to test 2. Take another Prednisone 50 mg 7 hours prior to test 3. Take another Prednisone 50 mg 1 hour prior to test 4. Take Benadryl 50 mg 1 hour prior to test . Patient must complete all four doses of above prophylactic medications. . Patient will need a ride after test due to Benadryl.  On the Day of the Test: . Drink plenty of water. Do not drink any water within one hour of  the test. . Do not eat any food 4 hours prior to the test. . You may take your regular medications prior to the test.  . Take metoprolol (Lopressor) two hours prior to test. . HOLD Furosemide/Hydrochlorothiazide morning of the test. . FEMALES- please wear underwire-free bra if available   *For Clinical Staff only. Please instruct patient the following:*        -Drink plenty of water       -Hold Furosemide/hydrochlorothiazide morning of the test       -Take metoprolol  (Lopressor) 2 hours prior to test (if applicable).                  -If HR is less than 55 BPM- No Beta Blocker                -IF HR is greater than 55 BPM and patient is less than or equal to 4 yrs old Lopressor 100mg  x1.                -If HR is greater than 55 BPM and patient is greater than 3 yrs old Lopressor 50 mg x1.     Do not give Lopressor to patients with an allergy to lopressor or anyone with asthma or active COPD symptoms (currently taking steroids).       After the Test: . Drink plenty of water. . After receiving IV contrast, you may experience a mild flushed feeling. This is normal. . On occasion, you may experience a mild rash up to 24 hours after the test. This is not dangerous. If this occurs, you can take Benadryl 25 mg and increase your fluid intake. . If you experience trouble breathing, this can be serious. If it is severe call 911 IMMEDIATELY. If it is mild, please call our office. . If you take any of these medications: Glipizide/Metformin, Avandament, Glucavance, please do not take 48 hours after completing test unless otherwise instructed.    Please contact the cardiac imaging nurse navigator should you have any questions/concerns Marchia Bond, RN Navigator Cardiac Imaging [facility] [phone] Office

## 2019-05-02 MED ORDER — METOPROLOL TARTRATE 50 MG PO TABS: ORAL_TABLET | ORAL | 0 refills | Status: DC

## 2019-05-16 ENCOUNTER — Telehealth: Payer: Self-pay | Admitting: Physician Assistant

## 2019-05-16 NOTE — Telephone Encounter (Signed)
New Message:     Pt wants to know when is she supposed to have her lab work? She received her lab order today.

## 2019-05-16 NOTE — Telephone Encounter (Signed)
Spoke with patient and advised needs labs within a week of her Cardiac Ct  If she does not hear from office regarding CT in 2 weeks to call back  Patient verbalized understanding

## 2019-05-31 ENCOUNTER — Encounter: Payer: Self-pay | Admitting: Internal Medicine

## 2019-05-31 ENCOUNTER — Ambulatory Visit (INDEPENDENT_AMBULATORY_CARE_PROVIDER_SITE_OTHER): Payer: Medicare Other | Admitting: Internal Medicine

## 2019-05-31 ENCOUNTER — Other Ambulatory Visit: Payer: Self-pay

## 2019-05-31 VITALS — BP 152/95 | HR 90 | Temp 97.0°F | Ht 64.0 in | Wt 215.0 lb

## 2019-05-31 DIAGNOSIS — E782 Mixed hyperlipidemia: Secondary | ICD-10-CM

## 2019-05-31 DIAGNOSIS — I10 Essential (primary) hypertension: Secondary | ICD-10-CM

## 2019-05-31 DIAGNOSIS — I25119 Atherosclerotic heart disease of native coronary artery with unspecified angina pectoris: Secondary | ICD-10-CM

## 2019-05-31 DIAGNOSIS — R072 Precordial pain: Secondary | ICD-10-CM

## 2019-05-31 NOTE — Progress Notes (Signed)
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 She has had chest pain and was seen in the ED in Sept 2016 and Myoview, chest CTA, and echo in Dec 2016 were unremarkable then. She was seen again in the ED Sept 2017 and ruled out for an MI. The pt says she had chest pain over the weekend. She did not go to the hospital.  She saw Dr Berdine Addison yesterday and he reassured her her EKG looked OK but wanted her seen here. The pt describes mid sternal "heaviness and tightness" that started Friday PM and lasted all weekend. Friday she had associated nausea, vomiting, and "sweating". Her pain would come and go, not obviously worsened with activity. She did not take NTG. She is on daily Nexium and is s/p remote cholecystectomy. Other medical problems include HTN, HLD, NIDDM, obesity, GERD, colon cancer s/p colectomy, and lung cancer s/p wedge resection.   06/24/2016  [name] returns today for follow-up of her heart catheterization. I have not seen her in a number of years. She recently saw Peggy Ransom, PA-C, who recommended a stress test for some symptoms concerning for angina. That stress test was negative for ischemia however, there was an abnormal TID ratio. I was planning on arranging for her catheterization however she had some more chest pain symptoms and presented to [facility]. Her pain was relieved with nitroglycerin and she was referred for cardiac catheterization. This was performed on 06/18/2016 by Dr. Saunders Revel, which demonstrated up to 30% mild to moderate nonobstructive coronary disease. LVEDP was 5 mmHg, LV EF was 60-65%. It was recommended that she go on to a long-acting nitrate for possible small vessel coronary disease. She says that she's been taking this and still is required a couple extra sublingual  nitroglycerin. She's been under significant stress. Her son was killed a number of years ago and had another son who died recently. Think this is playing a big role in things.  09/28/2016  [name] returns today for follow-up. Overall she is doing well without any recurrent chest pain. Blood pressure today is well controlled at 126/78. She denies any chest pain or worsening shortness of breath. EKG shows sinus rhythm at 91. It seems like the carvedilol seems to be working better at rate control for her. She was also started on HCTZ which he is helpful for her blood pressure is well.  05/12/2017  Peggy French was seen today in follow-up.  She continues to be asymptomatic.  She denies chest pain or worsening shortness of breath.  Blood pressure is well-controlled today 124/82.  She is followed by Dr. Malachy Mood at the cancer center for anemia.  EKG today shows sinus rhythm at 84.  She occasionally gets some chest discomfort which is relieved by Tums. She is also had some shortness of breath with exertion however is transient and is not worsening.  11/02/2017  Peggy French returns today for follow-up.  Overall she seems to be doing well.  She has some occasional chest discomfort which is short-lived.  She is being followed up for cancer but has a low [name].  EKG shows sinus rhythm without any ischemic changes.  She denies any worsening shortness of breath.  She has been under a lot of stress recently.  Weight has been stable.  Blood pressure is at goal.  She has not had recent lipid testing.  She also saw her primary recently for what sounds like a viral gastroenteritis.  05/31/2019  Peggy French is seen today in follow-up.  Overall she is doing well but recently has had some chest pain.  She is [name] PA-C in the office about a month ago.  He had recommended based on her prior history of some mild coronary disease by cath in 2017 for her to undergo CT coronary angiogram.  Unfortunately this was never scheduled.  There might have been an  insurance issue.  When she had cath last time she did have a Myoview stress test which was normal except for 3 times daily.  Ultimately that cath showed some mild nonobstructive coronary disease.  She is now having some more chest pain symptoms which have some typical but more atypical qualities.  She is on isosorbide and other good medical therapies.  She also has cancer and of course is under treatment with [name].  She has a CT coming up to reassess that.   Current Outpatient Medications  Medication Sig Dispense Refill  . acetaminophen (TYLENOL) 500 MG tablet Take 1,000 mg by mouth every 6 (six) hours as needed for mild pain or moderate pain. For pain    . allopurinol (ZYLOPRIM) 100 MG tablet Take 100 mg by mouth daily.    Marland Kitchen aspirin EC 81 MG tablet Take 1 tablet (81 mg total) by mouth daily.    . carboxymethylcellulose (REFRESH PLUS) 0.5 % SOLN Place 2 drops into both eyes 3 (three) times daily as needed (dry eyes).     . carvedilol (COREG) 3.125 MG tablet TAKE 1 TABLET BY MOUTH TWICE A DAY 180 tablet 3  . COLCRYS 0.6 MG tablet Take 0.6 mg by mouth daily.    . Cyanocobalamin (B-12 PO) Take 1 tablet by mouth daily.    . diphenoxylate-atropine (LOMOTIL) 2.5-0.025 MG tablet Take 1 tablet by mouth 4 (four) times daily as needed for diarrhea or loose stools.    Marland Kitchen esomeprazole (NEXIUM) 40 MG capsule Take 40 mg by mouth every evening.    . fexofenadine (ALLEGRA) 180 MG tablet Take 180 mg by mouth daily. For Allergies    . gabapentin (NEURONTIN) 100 MG capsule Take 100 mg by mouth at bedtime.     . hydrochlorothiazide (HYDRODIURIL) 12.5 MG tablet Take 1 tablet (12.5 mg total) by mouth daily. 90 tablet 4  . isosorbide mononitrate (IMDUR) 30 MG 24 hr tablet TAKE 1 TABLET BY MOUTH EVERY DAY (Patient taking differently: Take 30 mg by mouth 2 (two) times daily. ) 90 tablet 4  . linagliptin (TRADJENTA) 5 MG TABS tablet Take 5 mg by mouth daily after breakfast.     . Melatonin 5 MG TABS Take 5 mg by  mouth as needed.     . metoprolol tartrate (LOPRESSOR) 50 MG tablet Take 2 hours prior to procedure 1 tablet 0  . Multiple Vitamins-Minerals (WOMENS MULTIVITAMIN PO) Take by mouth daily.    . nitroGLYCERIN (NITROSTAT) 0.4 MG SL tablet Place 1 tablet (0.4 mg total) under the tongue every 5 (five) minutes as needed for chest pain. 25 tablet 3  . potassium chloride SA (KLOR-CON M20) 20 MEQ tablet Take 1 tablet (20 mEq total) by mouth daily. 30 tablet 2  . ramipril (ALTACE) 5 MG capsule Take 5 mg by mouth daily with breakfast.     . simvastatin (ZOCOR) 40 MG tablet Take 40 mg by  mouth at bedtime.     . vitamin C (ASCORBIC ACID) 500 MG tablet Take 500 mg by mouth daily.     No current facility-administered medications for this visit.    Facility-Administered Medications Ordered in Other Visits  Medication Dose Route Frequency Provider Last Rate Last Dose  . sodium chloride 0.9 % injection 10 mL  10 mL Intracatheter PRN Ladell Pier, MD        Allergies  Allergen Reactions  . Aspirin Palpitations    Social History   Socioeconomic History  . Marital status: Married    Spouse name: Not on file  . Number of children: 4  . Years of education: Not on file  . Highest education level: Not on file  Occupational History    Employer: RETIRED  Social Needs  . Financial resource strain: Not on file  . Food insecurity    Worry: Not on file    Inability: Not on file  . Transportation needs    Medical: Not on file    Non-medical: Not on file  Tobacco Use  . Smoking status: Never Smoker  . Smokeless tobacco: Never Used  Substance and Sexual Activity  . Alcohol use: No  . Drug use: No  . Sexual activity: Not on file  Lifestyle  . Physical activity    Days per week: Not on file    Minutes per session: Not on file  . Stress: Not on file  Relationships  . Social Herbalist on phone: Not on file    Gets together: Not on file    Attends religious service: Not on file     Active member of club or organization: Not on file    Attends meetings of clubs or organizations: Not on file    Relationship status: Not on file  . Intimate partner violence    Fear of current or ex partner: Not on file    Emotionally abused: Not on file    Physically abused: Not on file    Forced sexual activity: Not on file  Other Topics Concern  . Not on file  Social History Narrative   Married to husband, Herbie [place]   Retired Engineer, manufacturing systems     Review of Systems: Pertinent items noted in HPI and remainder of comprehensive ROS otherwise negative.  Exam: Blood pressure (!) 152/95, pulse 90, temperature (!) 97 F (36.1 C), height 5\' 4"  (1.626 m), weight 215 lb (97.5 kg), SpO2 96 %.  General appearance: alert, cooperative, no distress and morbidly obese Neck: no JVD Lungs: clear to auscultation bilaterally Heart: regular rate and rhythm Abdomen: obese, no obvious RLQ tenderness Extremities: extremities normal, atraumatic, no cyanosis or edema Pulses: 2+ and symmetric Skin: Skin color, texture, turgor normal. No rashes or lesions Neurologic: Grossly normal  EKG  Normal sinus rhythm at 90, nonspecific T wave changes-personally reviewed  ASSESSMENT: 1. Chest pain 2. Mild, nonobstructive coronary disease (cath 2017) - abnormal TID on myoview, no ischemia 3. Essential hypertension 4. Dyslipidemia 5. Type 2 diabetes with neuropathy 6. Moderate obesity   PLAN: Mrs. Midkiff is again having some progressive chest pain.  There is some typical and atypical symptoms.  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  This could also possibly be giving her chest pain.  She is scheduled to have a repeat CT scan to evaluate that.  I like for her to undergo a Myoview stress test.  She had mild disease in the past  and her Myoview was only abnormal in the sense that there was TID. If there is new ischemia, then we may need to pursue cath. I there is normal perfusion with only  TID, then I would only recommend medical therapy.  Follow-up with me afterwards.  Pixie Casino, MD, [facility] [place] Director of the Advanced Lipid Disorders &  Cardiovascular Risk Reduction Clinic Diplomate of the American Board of Clinical Lipidology Attending Cardiologist  Direct Dial: [phone]  Fax: [phone]  Website:  [email or web address]  05/31/2019 4:11 PM

## 2019-05-31 NOTE — Patient Instructions (Signed)
Medication Instructions:  Your physician recommends that you continue on your current medications as directed. Please refer to the Current Medication list given to you today.  *If you need a refill on your cardiac medications before your next appointment, please call your pharmacy*   Testing/Procedures: Your physician has requested that you have a lexiscan myoview. For further information please visit [email or web address]. Please follow instruction sheet, as given.  Follow-Up: At [facility], you and your health needs are our priority.  As part of our continuing mission to provide you with exceptional heart care, we have created designated Provider Care Teams.  These Care Teams include your primary Cardiologist (physician) and Advanced Practice Providers (APPs -  Physician Assistants and Nurse Practitioners) who all work together to provide you with the care you need, when you need it.   The format for your next appointment:   Virtual Visit after testing  Provider:   K. Mali Hilty, MD

## 2019-06-05 ENCOUNTER — Telehealth (HOSPITAL_COMMUNITY): Payer: Self-pay | Admitting: *Deleted

## 2019-06-05 NOTE — Telephone Encounter (Signed)
Patient given detailed instructions per Myocardial Perfusion Study Information Sheet for the test on 06/13/19. Patient notified to arrive 15 minutes early and that it is imperative to arrive on time for appointment to keep from having the test rescheduled.  If you need to cancel or reschedule your appointment, please call the office within 24 hours of your appointment. . Patient verbalized understanding. Peggy French

## 2019-06-12 ENCOUNTER — Encounter (HOSPITAL_COMMUNITY): Payer: Medicare Other

## 2019-06-13 ENCOUNTER — Other Ambulatory Visit: Payer: Self-pay

## 2019-06-13 ENCOUNTER — Ambulatory Visit (HOSPITAL_COMMUNITY): Payer: Medicare Other | Attending: Internal Medicine

## 2019-06-13 DIAGNOSIS — R072 Precordial pain: Secondary | ICD-10-CM | POA: Diagnosis not present

## 2019-06-13 LAB — MYOCARDIAL PERFUSION IMAGING
LV dias vol: 26 mL (ref 46–106)
LV sys vol: 10 mL
Peak HR: 115 {beats}/min
Rest HR: 83 {beats}/min
SDS: 2
SRS: 0
SSS: 2
TID: 0.88

## 2019-06-13 MED ORDER — TECHNETIUM TC 99M TETROFOSMIN IV KIT
31.8000 | PACK | Freq: Once | INTRAVENOUS | Status: AC | PRN
  Administered 2019-06-13: 31.8 via INTRAVENOUS
  Filled 2019-06-13: qty 32

## 2019-06-13 MED ORDER — TECHNETIUM TC 99M TETROFOSMIN IV KIT
10.1000 | PACK | Freq: Once | INTRAVENOUS | Status: AC | PRN
  Administered 2019-06-13: 10.1 via INTRAVENOUS
  Filled 2019-06-13: qty 11

## 2019-06-13 MED ORDER — REGADENOSON 0.4 MG/5ML IV SOLN
0.4000 mg | Freq: Once | INTRAVENOUS | Status: AC
  Administered 2019-06-13: 0.4 mg via INTRAVENOUS

## 2019-06-14 ENCOUNTER — Other Ambulatory Visit: Payer: Self-pay | Admitting: Internal Medicine

## 2019-06-20 ENCOUNTER — Telehealth: Payer: Self-pay | Admitting: Oncology

## 2019-06-20 NOTE — Telephone Encounter (Signed)
Returned patient's phone call regarding cancelling 12/10 appointment, patient will call back to reschedule follow-up visit once she reschedules her CT scan.

## 2019-06-21 ENCOUNTER — Inpatient Hospital Stay: Payer: Medicare Other

## 2019-06-21 ENCOUNTER — Ambulatory Visit (HOSPITAL_COMMUNITY): Payer: Medicare Other

## 2019-06-21 ENCOUNTER — Telehealth: Payer: Self-pay | Admitting: Internal Medicine

## 2019-06-21 MED ORDER — ISOSORBIDE MONONITRATE ER 30 MG PO TB24: 30.0000 mg | ORAL_TABLET | Freq: Two times a day (BID) | ORAL | 3 refills | Status: DC

## 2019-06-21 NOTE — Telephone Encounter (Signed)
New message    Patient calling to discuss stress test results

## 2019-06-21 NOTE — Telephone Encounter (Signed)
Repeated to patient that test was low risk and no ischemia.    Please clarify Imdur 30 mg dosing. Med list says daily, patient states she is taking it BID and pharmacy will not reffill as they are telling her it is too soon to refill.

## 2019-06-21 NOTE — Telephone Encounter (Signed)
LM for Peggy French that I changed her Imdur rx from daily to twice a day and sent it to her pharmacy.

## 2019-06-21 NOTE — Telephone Encounter (Signed)
If she feels good on twice daily dosing, ok to continue that and Rx for additional medicine so she won't run out.  Dr Lemmie Evens

## 2019-06-25 ENCOUNTER — Telehealth: Payer: Self-pay | Admitting: Oncology

## 2019-06-25 NOTE — Telephone Encounter (Signed)
Scheduled appt per 12/14 sch message- pt aware of labs scheduled for ct scan

## 2019-06-27 ENCOUNTER — Ambulatory Visit (HOSPITAL_COMMUNITY)
Admission: RE | Admit: 2019-06-27 | Discharge: 2019-06-27 | Disposition: A | Discharge status: Home / Self Care | Payer: Medicare Other | Source: Ambulatory Visit | Attending: Nurse Practitioner | Admitting: Nurse Practitioner

## 2019-06-27 ENCOUNTER — Inpatient Hospital Stay: Payer: Medicare Other | Attending: Oncology

## 2019-06-27 ENCOUNTER — Other Ambulatory Visit: Payer: Self-pay

## 2019-06-27 DIAGNOSIS — C189 Malignant neoplasm of colon, unspecified: Secondary | ICD-10-CM

## 2019-06-27 DIAGNOSIS — C7800 Secondary malignant neoplasm of unspecified lung: Secondary | ICD-10-CM

## 2019-06-27 DIAGNOSIS — E119 Type 2 diabetes mellitus without complications: Secondary | ICD-10-CM | POA: Insufficient documentation

## 2019-06-27 DIAGNOSIS — D509 Iron deficiency anemia, unspecified: Secondary | ICD-10-CM | POA: Insufficient documentation

## 2019-06-27 DIAGNOSIS — Z85038 Personal history of other malignant neoplasm of large intestine: Secondary | ICD-10-CM | POA: Diagnosis not present

## 2019-06-27 LAB — CMP (CANCER CENTER ONLY)
ALT: 20 U/L (ref 0–44)
AST: 28 U/L (ref 15–41)
Albumin: 3.7 g/dL (ref 3.5–5.0)
Alkaline Phosphatase: 96 U/L (ref 38–126)
Anion gap: 10 (ref 5–15)
BUN: 14 mg/dL (ref 8–23)
CO2: 29 mmol/L (ref 22–32)
Calcium: 8.9 mg/dL (ref 8.9–10.3)
Chloride: 103 mmol/L (ref 98–111)
Creatinine: 1.08 mg/dL — ABNORMAL HIGH (ref 0.44–1.00)
GFR, Est AFR Am: 57 mL/min — ABNORMAL LOW (ref >60)
GFR, Estimated: 49 mL/min — ABNORMAL LOW (ref >60)
Glucose, Bld: 116 mg/dL — ABNORMAL HIGH (ref 70–99)
Potassium: 3.8 mmol/L (ref 3.5–5.1)
Sodium: 142 mmol/L (ref 135–145)
Total Bilirubin: 0.3 mg/dL (ref 0.3–1.2)
Total Protein: 7.7 g/dL (ref 6.5–8.1)

## 2019-06-27 LAB — CBC WITH DIFFERENTIAL (CANCER CENTER ONLY)
Abs Immature Granulocytes: 0.01 10*3/uL (ref 0.00–0.07)
Basophils Absolute: 0 10*3/uL (ref 0.0–0.1)
Basophils Relative: 1 %
Eosinophils Absolute: 0.2 10*3/uL (ref 0.0–0.5)
Eosinophils Relative: 6 %
HCT: 41.9 % (ref 36.0–46.0)
Hemoglobin: 13.5 g/dL (ref 12.0–15.0)
Immature Granulocytes: 0 %
Lymphocytes Relative: 33 %
Lymphs Abs: 1.3 10*3/uL (ref 0.7–4.0)
MCH: 26.9 pg (ref 26.0–34.0)
MCHC: 32.2 g/dL (ref 30.0–36.0)
MCV: 83.5 fL (ref 80.0–100.0)
Monocytes Absolute: 0.4 10*3/uL (ref 0.1–1.0)
Monocytes Relative: 11 %
Neutro Abs: 2 10*3/uL (ref 1.7–7.7)
Neutrophils Relative %: 49 %
Platelet Count: 220 10*3/uL (ref 150–400)
RBC: 5.02 MIL/uL (ref 3.87–5.11)
RDW: 14.4 % (ref 11.5–15.5)
WBC Count: 4 10*3/uL (ref 4.0–10.5)
nRBC: 0 % (ref 0.0–0.2)

## 2019-06-27 LAB — CEA (IN HOUSE-CHCC): CEA (CHCC-In House): 4.24 ng/mL (ref 0.00–5.00)

## 2019-06-27 MED ORDER — IOHEXOL 300 MG/ML  SOLN
100.0000 mL | Freq: Once | INTRAMUSCULAR | Status: AC | PRN
  Administered 2019-06-27: 15:00:00 100 mL via INTRAVENOUS

## 2019-06-28 ENCOUNTER — Other Ambulatory Visit: Payer: Self-pay

## 2019-06-28 ENCOUNTER — Inpatient Hospital Stay: Payer: Medicare Other | Admitting: Oncology

## 2019-06-28 VITALS — BP 150/87 | HR 94 | Temp 98.5°F | Resp 17 | Ht 64.0 in | Wt 220.3 lb

## 2019-06-28 DIAGNOSIS — C7800 Secondary malignant neoplasm of unspecified lung: Secondary | ICD-10-CM

## 2019-06-28 DIAGNOSIS — C189 Malignant neoplasm of colon, unspecified: Secondary | ICD-10-CM | POA: Diagnosis not present

## 2019-06-28 DIAGNOSIS — Z85038 Personal history of other malignant neoplasm of large intestine: Secondary | ICD-10-CM | POA: Diagnosis not present

## 2019-06-28 NOTE — Progress Notes (Signed)
[facility] [place] OFFICE PROGRESS NOTE   Diagnosis: Colon cancer  INTERVAL HISTORY:   Ms. Jefcoat returns as scheduled.  She reports intermittent episodes of anterior chest discomfort.  She feels pain in the breasts when this occurs.  The pain occurs at rest and with exertion.  She has been evaluated by cardiology.  She underwent a negative stress test on 06/13/2019.  No palpable change at either breast.  No other complaint.  Objective:  Vital signs in last 24 hours:  Blood pressure (!) 150/87, pulse 94, temperature 98.5 F (36.9 C), temperature source Temporal, resp. rate 17, height 5\' 4"  (1.626 m), weight 220 lb 4.8 oz (99.9 kg), SpO2 99 %.    HEENT: Neck without mass Lymphatics: No cervical, supraclavicular, axillary, or inguinal nodes GI: No hepatosplenomegaly, no mass, mild tenderness in the mid abdomen bilaterally Vascular: No leg edema Breast: Right breast without mass, faint scar at the lateral aspect of the right breast Skin: Left thoracotomy scar without evidence of recurrent tumor    Lab Results:  Lab Results  Component Value Date   WBC 4.0 06/27/2019   HGB 13.5 06/27/2019   HCT 41.9 06/27/2019   MCV 83.5 06/27/2019   PLT 220 06/27/2019   NEUTROABS 2.0 06/27/2019    CMP  Lab Results  Component Value Date   NA 142 06/27/2019   K 3.8 06/27/2019   CL 103 06/27/2019   CO2 29 06/27/2019   GLUCOSE 116 (H) 06/27/2019   BUN 14 06/27/2019   CREATININE 1.08 (H) 06/27/2019   CALCIUM 8.9 06/27/2019   PROT 7.7 06/27/2019   ALBUMIN 3.7 06/27/2019   AST 28 06/27/2019   ALT 20 06/27/2019   ALKPHOS 96 06/27/2019   BILITOT 0.3 06/27/2019   GFRNONAA 49 (L) 06/27/2019   GFRAA 57 (L) 06/27/2019    Lab Results  Component Value Date   [name]1 4.24 06/27/2019    Imaging:  CT Chest W Contrast  Result Date: 06/27/2019 CLINICAL DATA:  Restaging metastatic colon cancer. History of right hemicolectomy and left lower lobe wedge resection. EXAM: CT CHEST,  ABDOMEN, AND PELVIS WITH CONTRAST TECHNIQUE: Multidetector CT imaging of the chest, abdomen and pelvis was performed following the standard protocol during bolus administration of intravenous contrast. CONTRAST:  171mL OMNIPAQUE IOHEXOL 300 MG/ML  SOLN COMPARISON:  06/14/2018 FINDINGS: CT CHEST FINDINGS Cardiovascular: The heart is normal in size. No pericardial effusion. The aorta is normal in caliber. No dissection. Minimal atherosclerotic calcifications. The branch vessels are patent. Scattered coronary artery calcifications. Mediastinum/Nodes: Small scattered mediastinal and hilar lymph nodes are stable. No mass or overt adenopathy. The esophagus is grossly normal. Stable small hiatal hernia. Stable thyromegaly. No thyroid masses. Lungs/Pleura: Stable surgical changes from a left lower lobe wedge resection. No findings suspicious for recurrent tumor. No new pulmonary lesions. Stable basilar predominant interstitial disease and pulmonary scarring. No pleural effusions. Musculoskeletal: No breast masses. Chronic area of probable scarring type changes in the right lateral breast. No supraclavicular or axillary adenopathy. The bony thorax is unremarkable.  No bone lesions or fractures. CT ABDOMEN PELVIS FINDINGS Hepatobiliary: No focal hepatic lesions or intrahepatic biliary dilatation. The gallbladder is surgically absent. No common bile duct dilatation. Pancreas: No mass, inflammation or ductal dilatation. Stable mild pancreatic atrophy. Spleen: Normal size.  No focal lesions. Adrenals/Urinary Tract: Adrenal glands are normal and stable. No renal lesions or hydronephrosis. The bladder is mildly distended but no mass or asymmetric bladder wall thickening. Stomach/Bowel: The stomach, duodenum and small bowel are  unremarkable. No acute inflammatory changes, mass lesions or obstructive findings. Stable surgical changes from a right hemicolectomy. No findings suspicious for recurrent tumor and no new colonic lesions  are identified. Vascular/Lymphatic: The aorta is normal in caliber. No dissection. The branch vessels are patent. The major venous structures are patent. No mesenteric or retroperitoneal mass or adenopathy. Small scattered lymph nodes are noted. Reproductive: The uterus is surgically absent. Both ovaries are still present and appear normal. Other: No pelvic mass or pelvic adenopathy. No free pelvic fluid collections. No inguinal mass or adenopathy. Musculoskeletal: No significant bony findings. IMPRESSION: 1. Status post right hemicolectomy without findings for recurrent tumor, locoregional adenopathy or metastatic disease elsewhere. 2. Status post wedge resection in the left lower lobe without findings for recurrent tumor or new metastatic pulmonary disease. 3. Stable thyromegaly. 4. Stable probable scarring changes in the right lateral breast. 5. Stable hiatal hernia. 6. Status post cholecystectomy.  No biliary dilatation. Electronically Signed   By: Marijo Sanes M.D.   On: 06/27/2019 17:19   CT Abdomen Pelvis W Contrast  Result Date: 06/27/2019 CLINICAL DATA:  Restaging metastatic colon cancer. History of right hemicolectomy and left lower lobe wedge resection. EXAM: CT CHEST, ABDOMEN, AND PELVIS WITH CONTRAST TECHNIQUE: Multidetector CT imaging of the chest, abdomen and pelvis was performed following the standard protocol during bolus administration of intravenous contrast. CONTRAST:  175mL OMNIPAQUE IOHEXOL 300 MG/ML  SOLN COMPARISON:  06/14/2018 FINDINGS: CT CHEST FINDINGS Cardiovascular: The heart is normal in size. No pericardial effusion. The aorta is normal in caliber. No dissection. Minimal atherosclerotic calcifications. The branch vessels are patent. Scattered coronary artery calcifications. Mediastinum/Nodes: Small scattered mediastinal and hilar lymph nodes are stable. No mass or overt adenopathy. The esophagus is grossly normal. Stable small hiatal hernia. Stable thyromegaly. No thyroid masses.  Lungs/Pleura: Stable surgical changes from a left lower lobe wedge resection. No findings suspicious for recurrent tumor. No new pulmonary lesions. Stable basilar predominant interstitial disease and pulmonary scarring. No pleural effusions. Musculoskeletal: No breast masses. Chronic area of probable scarring type changes in the right lateral breast. No supraclavicular or axillary adenopathy. The bony thorax is unremarkable.  No bone lesions or fractures. CT ABDOMEN PELVIS FINDINGS Hepatobiliary: No focal hepatic lesions or intrahepatic biliary dilatation. The gallbladder is surgically absent. No common bile duct dilatation. Pancreas: No mass, inflammation or ductal dilatation. Stable mild pancreatic atrophy. Spleen: Normal size.  No focal lesions. Adrenals/Urinary Tract: Adrenal glands are normal and stable. No renal lesions or hydronephrosis. The bladder is mildly distended but no mass or asymmetric bladder wall thickening. Stomach/Bowel: The stomach, duodenum and small bowel are unremarkable. No acute inflammatory changes, mass lesions or obstructive findings. Stable surgical changes from a right hemicolectomy. No findings suspicious for recurrent tumor and no new colonic lesions are identified. Vascular/Lymphatic: The aorta is normal in caliber. No dissection. The branch vessels are patent. The major venous structures are patent. No mesenteric or retroperitoneal mass or adenopathy. Small scattered lymph nodes are noted. Reproductive: The uterus is surgically absent. Both ovaries are still present and appear normal. Other: No pelvic mass or pelvic adenopathy. No free pelvic fluid collections. No inguinal mass or adenopathy. Musculoskeletal: No significant bony findings. IMPRESSION: 1. Status post right hemicolectomy without findings for recurrent tumor, locoregional adenopathy or metastatic disease elsewhere. 2. Status post wedge resection in the left lower lobe without findings for recurrent tumor or new  metastatic pulmonary disease. 3. Stable thyromegaly. 4. Stable probable scarring changes in the right lateral breast. 5.  Stable hiatal hernia. 6. Status post cholecystectomy.  No biliary dilatation. Electronically Signed   By: Marijo Sanes M.D.   On: 06/27/2019 17:19    Medications: I have reviewed the patient's current medications.   Assessment/Plan: 1. 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 She began adjuvant CAPOX chemotherapy on 02/24/2012. She completed cycle 8 on 07/27/2012. Negative surveillance colonoscopy 12/08/2012 2. Delayed nausea following cycle 1 CAPOX. Aloxi was added beginning with cycle 2 with improvement. 3. Diabetes. 4. Microcytic anemia. Hemoglobin is normal. She will discontinue iron. 5. Remote history of a "colon tumor" removed endoscopically in 1980. 6. Left lower lung nodule noted on a CT 02/21/2012, slightly larger than on a CT 12/03/2011. Chest CT 06/12/2012 showed the previously seen 5 mm pulmonary nodule in left lower lobe was scarcely visualized, estimated at 3 mm. No other suspicious pulmonary nodules were identified. Chest CT 11/27/2012 showed interval enlargement of the left lower lobe pulmonary nodule with a current measurement of 7 mm x 8 mm. No new pulmonary nodules. CT 02/22/2013 with enlargement of the anterior left lower lobe nodule and no other lung nodules. PET scan 03/02/2013 showed no associated hypermetabolism with the 12 mm left lower lobe pulmonary nodule. No hypermetabolic lymph nodes in the neck, chest, abdomen or pelvis. No abnormal hypermetabolic activity within the liver, pancreas, adrenal glands or spleen. Abnormal FDG accumulation in the posterior left nasopharyngeal mucosa.  Status post a wedge resection of the left lower lobe nodule on 03/27/2013 with the pathology confirming metastatic adenocarcinoma of colorectal primary.   Chest CT 07/18/2013-negative for recurrent disease.   Chest CT  09/19/2013 to evaluate for shortness of breath showed postoperative changes in the left lower lobe without evidence of recurrent or metastatic disease.  CT chest 04/01/2014 with postsurgical scarring in the left lower lobe. No evidence for metastatic disease in the chest. Stable appearance of mild subpleural reticulation of the lung bases.  CT chest/abdomen/pelvis 10/01/2014 with postoperative changes left lower lobe. No evidence of metastatic disease in the chest, abdomen or pelvis.  CT chest/abdomen/pelvis 11/03/2015 with no evidence of recurrent malignancy.  Colonoscopy 03/25/2016-tubular adenomas removed from the transverse colon and rectum  CT chest/abdomen/pelvis 06/17/2017-no evidence of recurrent malignancy.  CT chest/abdomen/pelvis 06/14/2018-no evidence of recurrent disease  CTs chest/abdomen/pelvis 06/27/2019-no evidence of recurrent disease 7. Status post Port-A-Cath placement 02/22/2012. Status post Port-A-Cath repositioning 03/14/2012. Port-A-Cath removed 06/04/2014 8. Probable acute laryngopharyngeal dysesthesia following cycle 2 CAPOX. Symptoms resolved with warming.  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 She has persistent neuropathy symptoms involving the feet. Improved with Neurontin 10. History of anterior chest pain-she has been evaluated by cardiology 11. 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     Disposition: Peggy French remains in clinical remission from colon cancer.  The restaging CT showed no evidence of recurrent disease.  She will return for an office visit and [name] in 6 months.   [name], MD  06/28/2019  12:51 PM

## 2019-06-29 ENCOUNTER — Telehealth: Payer: Self-pay | Admitting: Oncology

## 2019-06-29 NOTE — Telephone Encounter (Signed)
Scheduled per los. Called and spoke with patient. Confirmed appt 

## 2019-06-30 ENCOUNTER — Other Ambulatory Visit: Payer: Self-pay | Admitting: Internal Medicine

## 2019-12-27 ENCOUNTER — Other Ambulatory Visit: Payer: Self-pay

## 2019-12-27 ENCOUNTER — Encounter: Payer: Self-pay | Admitting: Nurse Practitioner

## 2019-12-27 ENCOUNTER — Inpatient Hospital Stay: Payer: Medicare Other

## 2019-12-27 ENCOUNTER — Inpatient Hospital Stay: Payer: Medicare Other | Attending: Nurse Practitioner | Admitting: Nurse Practitioner

## 2019-12-27 ENCOUNTER — Telehealth: Payer: Self-pay | Admitting: Nurse Practitioner

## 2019-12-27 VITALS — BP 153/87 | HR 91 | Temp 97.9°F | Resp 17 | Ht 64.0 in | Wt 215.5 lb

## 2019-12-27 DIAGNOSIS — C7800 Secondary malignant neoplasm of unspecified lung: Secondary | ICD-10-CM

## 2019-12-27 DIAGNOSIS — G620 Drug-induced polyneuropathy: Secondary | ICD-10-CM | POA: Diagnosis not present

## 2019-12-27 DIAGNOSIS — E119 Type 2 diabetes mellitus without complications: Secondary | ICD-10-CM | POA: Insufficient documentation

## 2019-12-27 DIAGNOSIS — Z85038 Personal history of other malignant neoplasm of large intestine: Secondary | ICD-10-CM | POA: Diagnosis not present

## 2019-12-27 DIAGNOSIS — C189 Malignant neoplasm of colon, unspecified: Secondary | ICD-10-CM

## 2019-12-27 DIAGNOSIS — D509 Iron deficiency anemia, unspecified: Secondary | ICD-10-CM | POA: Insufficient documentation

## 2019-12-27 NOTE — Telephone Encounter (Signed)
Scheduled per los. Gave avs and calendar  

## 2019-12-27 NOTE — Progress Notes (Signed)
[place] OFFICE PROGRESS NOTE   Diagnosis: Colon cancer  INTERVAL HISTORY:   Peggy French returns as scheduled.  She feels well.  No change in bowel habits.  No rectal bleeding.  She has a good appetite.  Mild stable dyspnea on exertion.  No fever or cough.  Objective:  Vital signs in last 24 hours:  Blood pressure (!) 153/87, pulse 91, temperature 97.9 F (36.6 C), temperature source Temporal, resp. rate 17, height 5\' 4"  (1.626 m), weight 215 lb 8 oz (97.8 kg), SpO2 98 %.    HEENT: Neck without mass. Lymphatics: No palpable cervical, supraclavicular, axillary or inguinal lymph nodes. Resp: Lungs clear bilaterally. Cardio: Regular rate and rhythm. GI: Abdomen soft and nontender.  No hepatomegaly. Vascular: No leg edema.   Lab Results:  Lab Results  Component Value Date   WBC 4.0 06/27/2019   HGB 13.5 06/27/2019   HCT 41.9 06/27/2019   MCV 83.5 06/27/2019   PLT 220 06/27/2019   NEUTROABS 2.0 06/27/2019    Imaging:  No results found.  Medications: I have reviewed the patient's current medications.  Assessment/Plan: 1. 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 She began adjuvant CAPOX chemotherapy on 02/24/2012. She completed cycle 8 on 07/27/2012. Negative surveillance colonoscopy 12/08/2012 2. Delayed nausea following cycle 1 CAPOX. Aloxi was added beginning with cycle 2 with improvement. 3. Diabetes. 4. Microcytic anemia. Hemoglobin is normal. She will discontinue iron. 5. Remote history of a "colon tumor" removed endoscopically in 1980. 6. Left lower lung nodule noted on a CT 02/21/2012, slightly larger than on a CT 12/03/2011. Chest CT 06/12/2012 showed the previously seen 5 mm pulmonary nodule in left lower lobe was scarcely visualized, estimated at 3 mm. No other suspicious pulmonary nodules were identified. Chest CT 11/27/2012 showed interval enlargement of the left lower lobe pulmonary nodule  with a current measurement of 7 mm x 8 mm. No new pulmonary nodules. CT 02/22/2013 with enlargement of the anterior left lower lobe nodule and no other lung nodules. PET scan 03/02/2013 showed no associated hypermetabolism with the 12 mm left lower lobe pulmonary nodule. No hypermetabolic lymph nodes in the neck, chest, abdomen or pelvis. No abnormal hypermetabolic activity within the liver, pancreas, adrenal glands or spleen. Abnormal FDG accumulation in the posterior left nasopharyngeal mucosa.  Status post a wedge resection of the left lower lobe nodule on 03/27/2013 with the pathology confirming metastatic adenocarcinoma of colorectal primary.   Chest CT 07/18/2013-negative for recurrent disease.   Chest CT 09/19/2013 to evaluate for shortness of breath showed postoperative changes in the left lower lobe without evidence of recurrent or metastatic disease.  CT chest 04/01/2014 with postsurgical scarring in the left lower lobe. No evidence for metastatic disease in the chest. Stable appearance of mild subpleural reticulation of the lung bases.  CT chest/abdomen/pelvis 10/01/2014 with postoperative changes left lower lobe. No evidence of metastatic disease in the chest, abdomen or pelvis.  CT chest/abdomen/pelvis 11/03/2015 with no evidence of recurrent malignancy.  Colonoscopy 03/25/2016-tubular adenomas removed from the transverse colon and rectum  CT chest/abdomen/pelvis 06/17/2017-no evidence of recurrent malignancy.  CT chest/abdomen/pelvis 06/14/2018-no evidence of recurrent disease  CTs chest/abdomen/pelvis 06/27/2019-no evidence of recurrent disease 7. Status post Port-A-Cath placement 02/22/2012. Status post Port-A-Cath repositioning 03/14/2012. Port-A-Cath removed 06/04/2014 8. Probable acute laryngopharyngeal dysesthesia following cycle 2 CAPOX. Symptoms resolved with warming.  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 She has persistent neuropathy symptoms  involving the feet. Improved with Neurontin  10. History of anterior chest pain-she has been evaluated by cardiology 11. 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  Disposition: Peggy French remains in clinical remission from colon cancer.  We will follow-up on the [name] from today.  She will return for a [name] and office visit in 6 months.    Ned Card ANP/GNP-BC   12/27/2019  3:34 PM

## 2019-12-28 LAB — CEA (IN HOUSE-CHCC): CEA (CHCC-In House): 4.07 ng/mL (ref 0.00–5.00)

## 2020-01-02 ENCOUNTER — Telehealth: Payer: Self-pay | Admitting: *Deleted

## 2020-01-02 NOTE — Telephone Encounter (Signed)
Notified of normal [name] results.

## 2020-06-04 ENCOUNTER — Other Ambulatory Visit: Payer: Self-pay | Admitting: Internal Medicine

## 2020-06-16 ENCOUNTER — Other Ambulatory Visit (HOSPITAL_COMMUNITY): Payer: Self-pay | Admitting: Family Medicine

## 2020-06-16 DIAGNOSIS — Z1231 Encounter for screening mammogram for malignant neoplasm of breast: Secondary | ICD-10-CM

## 2020-06-26 ENCOUNTER — Telehealth: Payer: Self-pay | Admitting: Oncology

## 2020-06-26 ENCOUNTER — Inpatient Hospital Stay: Payer: Medicare Other

## 2020-06-26 ENCOUNTER — Inpatient Hospital Stay: Payer: Medicare Other | Attending: Oncology | Admitting: Oncology

## 2020-06-26 ENCOUNTER — Telehealth: Payer: Self-pay

## 2020-06-26 ENCOUNTER — Other Ambulatory Visit: Payer: Self-pay

## 2020-06-26 VITALS — BP 158/82 | HR 86 | Temp 97.9°F | Resp 16 | Ht 64.0 in | Wt 208.3 lb

## 2020-06-26 DIAGNOSIS — Z85038 Personal history of other malignant neoplasm of large intestine: Secondary | ICD-10-CM | POA: Diagnosis not present

## 2020-06-26 DIAGNOSIS — C7800 Secondary malignant neoplasm of unspecified lung: Secondary | ICD-10-CM

## 2020-06-26 DIAGNOSIS — G620 Drug-induced polyneuropathy: Secondary | ICD-10-CM | POA: Insufficient documentation

## 2020-06-26 DIAGNOSIS — R197 Diarrhea, unspecified: Secondary | ICD-10-CM | POA: Insufficient documentation

## 2020-06-26 DIAGNOSIS — Z9221 Personal history of antineoplastic chemotherapy: Secondary | ICD-10-CM | POA: Diagnosis not present

## 2020-06-26 DIAGNOSIS — C189 Malignant neoplasm of colon, unspecified: Secondary | ICD-10-CM | POA: Diagnosis not present

## 2020-06-26 DIAGNOSIS — D509 Iron deficiency anemia, unspecified: Secondary | ICD-10-CM | POA: Insufficient documentation

## 2020-06-26 DIAGNOSIS — R234 Changes in skin texture: Secondary | ICD-10-CM | POA: Diagnosis not present

## 2020-06-26 DIAGNOSIS — R918 Other nonspecific abnormal finding of lung field: Secondary | ICD-10-CM | POA: Insufficient documentation

## 2020-06-26 DIAGNOSIS — E119 Type 2 diabetes mellitus without complications: Secondary | ICD-10-CM | POA: Diagnosis not present

## 2020-06-26 LAB — CEA (IN HOUSE-CHCC): CEA (CHCC-In House): 5.66 ng/mL — ABNORMAL HIGH (ref 0.00–5.00)

## 2020-06-26 NOTE — Progress Notes (Signed)
[place] OFFICE PROGRESS NOTE   Diagnosis: Colon cancer  INTERVAL HISTORY:   Peggy French returns as scheduled.  She feels well.  Good appetite.  She has intermittent skin thickening and flaking at the bilateral areola.  The area is painful.  No breast mass.  She is scheduled for mammogram next week.  She reports balance difficulty.  No falls.  She had an episode of diarrhea this morning.  Objective:  Vital signs in last 24 hours:  Blood pressure (!) 158/82, pulse 86, temperature 97.9 F (36.6 C), temperature source Tympanic, resp. rate 16, height 5\' 4"  (1.626 m), weight 208 lb 4.8 oz (94.5 kg), SpO2 99 %.    Lymphatics: No cervical, supraclavicular, axillary, or inguinal nodes Resp: Lungs clear bilaterally Cardio: Regular rate and rhythm GI: Hepatosplenomegaly, nontender, no mass Vascular: No leg edema Neurologic: Finger-to-nose testing is normal, no difficulty ambulating to the examination table Skin: Areas of hyperpigmented plaque-like thickening at the areola bilaterally, left chest wall scar without evidence of recurrent tumor    Lab Results:  Lab Results  Component Value Date   WBC 4.0 06/27/2019   HGB 13.5 06/27/2019   HCT 41.9 06/27/2019   MCV 83.5 06/27/2019   PLT 220 06/27/2019   NEUTROABS 2.0 06/27/2019    CMP  Lab Results  Component Value Date   NA 142 06/27/2019   K 3.8 06/27/2019   CL 103 06/27/2019   CO2 29 06/27/2019   GLUCOSE 116 (H) 06/27/2019   BUN 14 06/27/2019   CREATININE 1.08 (H) 06/27/2019   CALCIUM 8.9 06/27/2019   PROT 7.7 06/27/2019   ALBUMIN 3.7 06/27/2019   AST 28 06/27/2019   ALT 20 06/27/2019   ALKPHOS 96 06/27/2019   BILITOT 0.3 06/27/2019   GFRNONAA 49 (L) 06/27/2019   GFRAA 57 (L) 06/27/2019    Lab Results  Component Value Date   [name]1 4.07 12/27/2019     Medications: I have reviewed the patient's current medications.   Assessment/Plan: 1. 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 She began adjuvant CAPOX chemotherapy on 02/24/2012. She completed cycle 8 on 07/27/2012. Negative surveillance colonoscopy 12/08/2012 2. Delayed nausea following cycle 1 CAPOX. Aloxi was added beginning with cycle 2 with improvement. 3. Diabetes. 4. Microcytic anemia. Hemoglobin is normal. She will discontinue iron. 5. Remote history of a "colon tumor" removed endoscopically in 1980. 6. Left lower lung nodule noted on a CT 02/21/2012, slightly larger than on a CT 12/03/2011. Chest CT 06/12/2012 showed the previously seen 5 mm pulmonary nodule in left lower lobe was scarcely visualized, estimated at 3 mm. No other suspicious pulmonary nodules were identified. Chest CT 11/27/2012 showed interval enlargement of the left lower lobe pulmonary nodule with a current measurement of 7 mm x 8 mm. No new pulmonary nodules. CT 02/22/2013 with enlargement of the anterior left lower lobe nodule and no other lung nodules. PET scan 03/02/2013 showed no associated hypermetabolism with the 12 mm left lower lobe pulmonary nodule. No hypermetabolic lymph nodes in the neck, chest, abdomen or pelvis. No abnormal hypermetabolic activity within the liver, pancreas, adrenal glands or spleen. Abnormal FDG accumulation in the posterior left nasopharyngeal mucosa.  Status post a wedge resection of the left lower lobe nodule on 03/27/2013 with the pathology confirming metastatic adenocarcinoma of colorectal primary.   Chest CT 07/18/2013-negative for recurrent disease.   Chest CT 09/19/2013 to evaluate for shortness of breath showed postoperative changes in the left lower lobe  without evidence of recurrent or metastatic disease.  CT chest 04/01/2014 with postsurgical scarring in the left lower lobe. No evidence for metastatic disease in the chest. Stable appearance of mild subpleural reticulation of the lung bases.  CT chest/abdomen/pelvis 10/01/2014 with postoperative changes left lower lobe.  No evidence of metastatic disease in the chest, abdomen or pelvis.  CT chest/abdomen/pelvis 11/03/2015 with no evidence of recurrent malignancy.  Colonoscopy 03/25/2016-tubular adenomas removed from the transverse colon and rectum  CT chest/abdomen/pelvis 06/17/2017-no evidence of recurrent malignancy.  CT chest/abdomen/pelvis 06/14/2018-no evidence of recurrent disease  CTs chest/abdomen/pelvis 06/27/2019-no evidence of recurrent disease 7. Status post Port-A-Cath placement 02/22/2012. Status post Port-A-Cath repositioning 03/14/2012. Port-A-Cath removed 06/04/2014 8. Probable acute laryngopharyngeal dysesthesia following cycle 2 CAPOX. Symptoms resolved with warming.  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 She has persistent neuropathy symptoms involving the feet. Improved with Neurontin 10. History of anterior chest pain-she has been evaluated by cardiology 11. 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    Disposition: Peggy French remains in clinical remission from colon cancer.  We will follow up on the [name] from today.  She will return for an office visit and [name] in 6 months.  She continues colonoscopy surveillance with Dr. Laural Golden.  We decided against further surveillance imaging.  I recommended she follow-up with Dr. Berdine Addison or a dermatologist to evaluate the areola skin changes.  She is scheduled for a mammogram next week.    [name], MD  06/26/2020  12:00 PM

## 2020-06-26 NOTE — Progress Notes (Signed)
Reports she has had three COVID vaccines and Flu vaccine

## 2020-06-26 NOTE — Telephone Encounter (Signed)
-----   Message from Ladell Pier, MD sent at 06/26/2020  1:35 PM EST ----- Please call patient, the [name] is borderline elevated, repeat in 1 month and if elevated will refer her for staging CTs and an office visit 1-2 days later

## 2020-06-26 NOTE — Telephone Encounter (Signed)
Called left message to call to receive [name] results  Awaiting return call

## 2020-06-26 NOTE — Telephone Encounter (Signed)
Scheduled appointments per 12/16 los. Spoke to patient who is aware of appointments date and times.

## 2020-06-27 ENCOUNTER — Telehealth: Payer: Self-pay | Admitting: *Deleted

## 2020-06-27 DIAGNOSIS — C7800 Secondary malignant neoplasm of unspecified lung: Secondary | ICD-10-CM

## 2020-06-27 NOTE — Telephone Encounter (Signed)
Called patient w/[name] results and recommendation by [name] to recheck in 1 month. If still elevated, can do scan and see her few days afterward. Encouraged not to panic, that [name] can go up and down sometimes. Scheduler will call her for lab appointment. She understands and agrees to plan. Scheduling message sent.

## 2020-07-02 ENCOUNTER — Ambulatory Visit (HOSPITAL_COMMUNITY)
Admission: RE | Admit: 2020-07-02 | Discharge: 2020-07-02 | Disposition: A | Discharge status: Home / Self Care | Payer: Medicare Other | Source: Ambulatory Visit | Attending: Family Medicine | Admitting: Family Medicine

## 2020-07-02 ENCOUNTER — Other Ambulatory Visit: Payer: Self-pay

## 2020-07-02 ENCOUNTER — Encounter (HOSPITAL_COMMUNITY): Payer: Self-pay

## 2020-07-02 DIAGNOSIS — Z1231 Encounter for screening mammogram for malignant neoplasm of breast: Secondary | ICD-10-CM | POA: Insufficient documentation

## 2020-07-08 ENCOUNTER — Telehealth: Payer: Self-pay | Admitting: Oncology

## 2020-07-08 NOTE — Telephone Encounter (Signed)
Scheduled lab appointment per 12/28 schedule message. Patient is aware.

## 2020-07-11 ENCOUNTER — Other Ambulatory Visit: Payer: Self-pay | Admitting: Internal Medicine

## 2020-07-25 ENCOUNTER — Telehealth: Payer: Self-pay | Admitting: Nurse Practitioner

## 2020-07-25 NOTE — Telephone Encounter (Signed)
Returned call to reschedule appointment per 1/14 schedule message. Unable to leave message.

## 2020-07-28 ENCOUNTER — Inpatient Hospital Stay: Payer: Medicare Other

## 2020-07-30 ENCOUNTER — Telehealth: Payer: Self-pay | Admitting: Oncology

## 2020-07-30 NOTE — Telephone Encounter (Signed)
R/s missed labs per 1/19 sch msg. Pt confirmed new appt date and time.

## 2020-08-01 ENCOUNTER — Other Ambulatory Visit: Payer: Self-pay

## 2020-08-01 ENCOUNTER — Inpatient Hospital Stay: Payer: Medicare Other | Attending: Oncology

## 2020-08-01 DIAGNOSIS — C7800 Secondary malignant neoplasm of unspecified lung: Secondary | ICD-10-CM

## 2020-08-01 DIAGNOSIS — Z85038 Personal history of other malignant neoplasm of large intestine: Secondary | ICD-10-CM | POA: Insufficient documentation

## 2020-08-04 LAB — CEA (IN HOUSE-CHCC): CEA (CHCC-In House): 5.5 ng/mL — ABNORMAL HIGH (ref 0.00–5.00)

## 2020-08-08 ENCOUNTER — Telehealth: Payer: Self-pay

## 2020-08-08 NOTE — Telephone Encounter (Signed)
Returned call to pt in reference to lab results/[name]. 08/01/20 Value stable/ 5.50. Value on 06/26/20 /5.66. Information given to pt and assured her information would also be given to [name] to review.

## 2020-08-20 ENCOUNTER — Other Ambulatory Visit: Payer: Self-pay | Admitting: Internal Medicine

## 2020-09-02 ENCOUNTER — Other Ambulatory Visit: Payer: Self-pay | Admitting: Internal Medicine

## 2020-09-12 ENCOUNTER — Other Ambulatory Visit: Payer: Self-pay | Admitting: Internal Medicine

## 2020-09-23 ENCOUNTER — Telehealth: Payer: Self-pay

## 2020-09-23 ENCOUNTER — Telehealth (INDEPENDENT_AMBULATORY_CARE_PROVIDER_SITE_OTHER): Payer: Medicare Other | Admitting: Physician Assistant

## 2020-09-23 ENCOUNTER — Encounter: Payer: Self-pay | Admitting: Physician Assistant

## 2020-09-23 VITALS — BP 146/79 | HR 95 | Ht 66.0 in | Wt 210.0 lb

## 2020-09-23 DIAGNOSIS — I10 Essential (primary) hypertension: Secondary | ICD-10-CM

## 2020-09-23 DIAGNOSIS — E119 Type 2 diabetes mellitus without complications: Secondary | ICD-10-CM

## 2020-09-23 DIAGNOSIS — R0600 Dyspnea, unspecified: Secondary | ICD-10-CM | POA: Diagnosis not present

## 2020-09-23 DIAGNOSIS — R079 Chest pain, unspecified: Secondary | ICD-10-CM | POA: Diagnosis not present

## 2020-09-23 DIAGNOSIS — E785 Hyperlipidemia, unspecified: Secondary | ICD-10-CM | POA: Diagnosis not present

## 2020-09-23 DIAGNOSIS — I2510 Atherosclerotic heart disease of native coronary artery without angina pectoris: Secondary | ICD-10-CM

## 2020-09-23 DIAGNOSIS — G8929 Other chronic pain: Secondary | ICD-10-CM

## 2020-09-23 NOTE — Patient Instructions (Addendum)
Medication Instructions:   TAKE Ramipril at night time   *If you need a refill on your cardiac medications before your next appointment, please call your pharmacy*  Lab Work: NONE ordered at this time of appointment   If you have labs (blood work) drawn today and your tests are completely normal, you will receive your results only by: Marland Kitchen MyChart Message (if you have MyChart) OR . A paper copy in the mail If you have any lab test that is abnormal or we need to change your treatment, we will call you to review the results.  Testing/Procedures: Your physician has requested that you have an echocardiogram. Echocardiography is a painless test that uses sound waves to create images of your heart. It provides your doctor with information about the size and shape of your heart and how well your heart's chambers and valves are working. This procedure takes approximately one hour. There are no restrictions for this procedure. This Test is performed at [facility] [address], [place] [address]   Please schedule for 1 month    Follow-Up: At [facility], you and your health needs are our priority.  As part of our continuing mission to provide you with exceptional heart care, we have created designated Provider Care Teams.  These Care Teams include your primary Cardiologist (physician) and Advanced Practice Providers (APPs -  Physician Assistants and Nurse Practitioners) who all work together to provide you with the care you need, when you need it.  We recommend signing up for the patient portal called "MyChart".  Sign up information is provided on this After Visit Summary.  MyChart is used to connect with patients for Virtual Visits (Telemedicine).  Patients are able to view lab/test results, encounter notes, upcoming appointments, etc.  Non-urgent messages can be sent to your provider as well.   To learn more about what you can do with MyChart, go to [email or web address].    Your next  appointment:   3-4 month(s)  The format for your next appointment:   In Person  Provider:   K. Mali Hilty, MD  Other Instructions

## 2020-09-23 NOTE — Telephone Encounter (Signed)
  Patient Consent for Virtual Visit         LAYAAN French has provided verbal consent on 09/23/2020 for a virtual visit (video or telephone).   CONSENT FOR VIRTUAL VISIT FOR:  Peggy French  By participating in this virtual visit I agree to the following:  I hereby voluntarily request, consent and authorize [facility] and its employed or contracted physicians, physician assistants, nurse practitioners or other licensed health care professionals (the Practitioner), to provide me with telemedicine health care services (the "Services") as deemed necessary by the treating Practitioner. I acknowledge and consent to receive the Services by the Practitioner via telemedicine. I understand that the telemedicine visit will involve communicating with the Practitioner through live audiovisual communication technology and the disclosure of certain medical information by electronic transmission. I acknowledge that I have been given the opportunity to request an in-person assessment or other available alternative prior to the telemedicine visit and am voluntarily participating in the telemedicine visit.  I understand that I have the right to withhold or withdraw my consent to the use of telemedicine in the course of my care at any time, without affecting my right to future care or treatment, and that the Practitioner or I may terminate the telemedicine visit at any time. I understand that I have the right to inspect all information obtained and/or recorded in the course of the telemedicine visit and may receive copies of available information for a reasonable fee.  I understand that some of the potential risks of receiving the Services via telemedicine include:  Marland Kitchen Delay or interruption in medical evaluation due to technological equipment failure or disruption; . Information transmitted may not be sufficient (e.g. poor resolution of images) to allow for appropriate medical decision making by the  Practitioner; and/or  . In rare instances, security protocols could fail, causing a breach of personal health information.  Furthermore, I acknowledge that it is my responsibility to provide information about my medical history, conditions and care that is complete and accurate to the best of my ability. I acknowledge that Practitioner's advice, recommendations, and/or decision may be based on factors not within their control, such as incomplete or inaccurate data provided by me or distortions of diagnostic images or specimens that may result from electronic transmissions. I understand that the practice of medicine is not an exact science and that Practitioner makes no warranties or guarantees regarding treatment outcomes. I acknowledge that a copy of this consent can be made available to me via my patient portal ([facility]), or I can request a printed copy by calling the office of [facility].    I understand that my insurance will be billed for this visit.   I have read or had this consent read to me. . I understand the contents of this consent, which adequately explains the benefits and risks of the Services being provided via telemedicine.  . I have been provided ample opportunity to ask questions regarding this consent and the Services and have had my questions answered to my satisfaction. . I give my informed consent for the services to be provided through the use of telemedicine in my medical care

## 2020-09-23 NOTE — Telephone Encounter (Signed)
Left a detailed voice message for the patient asking to give our office a call for her virtual appointment at 3:15 PM with [name], PA-C.

## 2020-09-23 NOTE — Progress Notes (Signed)
Virtual Visit via Telephone Note   This visit type was conducted due to national recommendations for restrictions regarding the COVID-19 Pandemic (e.g. social distancing) in an effort to limit this patient's exposure and mitigate transmission in our community.  Due to her co-morbid illnesses, this patient is at least at moderate risk for complications without adequate follow up.  This format is felt to be most appropriate for this patient at this time.  The patient did not have access to video technology/had technical difficulties with video requiring transitioning to audio format only (telephone).  All issues noted in this document were discussed and addressed.  No physical exam could be performed with this format.  Please refer to the patient's chart for her  consent to telehealth for [facility].    Date:  09/23/2020   ID:  Peggy French, DOB 02/25/1941, MRN [id number] The patient was identified using 2 identifiers.  Patient Location: Home Provider Location: Office/Clinic   PCP:  Iona Beard, [place]  Cardiologist:  Pixie Casino, MD  Advanced Practice Provider:  No care team member to display Electrophysiologist:  None   Evaluation Performed:  Follow-Up Visit  Chief Complaint: Annual follow-up  History of Present Illness:    Peggy French is a 80 y.o. female with past medical history of HTN, HLD, DM 2, obesity, colon cancer and history of mild CAD by cath in 2001 and 2012.  She had recurrent chest pain September 2016 and had CTA, Myoview and echocardiogram that were unremarkable.  She was seen in the emergency room again in September 2017 and was ruled out for MI.  Due to recurrent chest discomfort, she had Myoview in December 2017, this showed EF 58%, no ischemia, however abnormal TID ratio of 1.52.  Before cardiac catheterization was recommended, patient presented [facility] with recurrent chest pain.  Repeat cardiac  catheterization in December 2017 showed mild to moderate nonobstructive disease up to 30% involving LAD, left circumflex and RCA, EF 60 to 65%.  It was recommended for the patient to go on long-acting nitrate for possible small vessel coronary artery disease.  Patient was last seen by cardiology service in November 2020 due to intermittent chest pain, I initially recommend a coronary CT, however this was not done due to insurance reason.  Patient eventually underwent Myoview on 06/13/2019 that came back low risk but EF of 62%, no significant perfusion abnormality.  Patient presents today for virtual follow-up.  She continues to have intermittent chest discomfort unchanged when compared to December 2020 when she had last Myoview.  She does feel more short of breath with exertion lately.  I recommended echocardiogram to assess ejection fraction and wall motion.  She also noted labile blood pressure with systolic blood pressure ranges between 90-150s, I recommended moving her ramipril to nighttime every day.  Otherwise she can follow-up with Dr. Debara Pickett in 3-4 months for reassessment.   The patient does not have symptoms concerning for COVID-19 infection (fever, chills, cough, or new shortness of breath).    Past Medical History:  Diagnosis Date  . Asthmatic bronchitis 2011  . 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 s/p right hemicolectomy/chemo 2013 with mets to the lung s/p LLL wedge resection 2014.  . Diabetes mellitus    x over 10 yrs  . GERD (gastroesophageal reflux disease)   . Gout   . H/O hiatal hernia   . History of colon cancer   .  History of shingles   . Hyperlipidemia   . Hypertension    EKG 10/12 EPIC, chest- 1 view  6/13 EPIC    Last 2D Echo on 05/02/2012 showed EF of greater than 55%  . Microcytic anemia   . Mild CAD    a. cath by Dr. Debara Pickett in 03/2011 - 20-30% ostial bifurcation stenosis of LAD, otherwise only mild luminal irregularities in LAD/RCA, LVEF 60%, no RWMA.    c. 06/18/16 LHC after abnormal nuc showed mild non obst CAD  . Neuropathy   . Obesity    Past Surgical History:  Procedure Laterality Date  . ABDOMINAL HYSTERECTOMY    . APPENDECTOMY    . CARDIAC CATHETERIZATION  [phone]  . CARDIAC CATHETERIZATION N/A 06/18/2016   Procedure: Left Heart Cath and Coronary Angiography;  Surgeon: Nelva Bush, MD;  Location: [facility];  Service: Cardiovascular;  Laterality: N/A;  . CATARACT EXTRACTION W/PHACO Left 02/17/2015   Procedure: CATARACT EXTRACTION PHACO AND INTRAOCULAR LENS PLACEMENT (IOC);  Surgeon: Tonny Branch, MD;  Location: [facility];  Service: Ophthalmology;  Laterality: Left;  CDE:8.01   . CHOLECYSTECTOMY    . COLON SURGERY     PARTIAL COLECTOMY 30 YRS / AGAIN 2013  . COLONOSCOPY  11/26/2011   Procedure: COLONOSCOPY;  Surgeon: Rogene Houston, MD;  Location: [facility];  Service: Endoscopy;  Laterality: N/A;  2:15  . COLONOSCOPY N/A 12/08/2012   Procedure: COLONOSCOPY;  Surgeon: Rogene Houston, MD;  Location: [facility];  Service: Endoscopy;  Laterality: N/A;  815-moved to 0950 Ann to notify pt  . COLONOSCOPY N/A 03/25/2016   Procedure: COLONOSCOPY;  Surgeon: Rogene Houston, MD;  [facility];  Service: Endoscopy;  Laterality: N/A;  1200  . ESOPHAGOGASTRODUODENOSCOPY  06/16/2011   Procedure: ESOPHAGOGASTRODUODENOSCOPY (EGD);  Surgeon: Rogene Houston, MD;  Location: [facility];  Service: Endoscopy;  Laterality: N/A;  2:15  . PORT-A-CATH REMOVAL N/A 06/04/2014   Procedure: REMOVAL PORT-A-CATH;  Surgeon: Pedro Earls, MD;  Location: [facility];  Service: General;  Laterality: N/A;  . PORTACATH PLACEMENT  02/22/2012   Procedure: INSERTION PORT-A-CATH;  Surgeon: Pedro Earls, MD;  Location: [facility];  Service: General;  Laterality: N/A;  left subclavian  . VIDEO ASSISTED THORACOSCOPY (VATS)/WEDGE RESECTION Left 03/27/2013   Procedure: VIDEO ASSISTED THORACOSCOPY (VATS)/WEDGE RESECTION;  Surgeon: Grace Isaac, MD;   Location: [facility];  Service: Thoracic;  Laterality: Left;  Marland Kitchen VIDEO BRONCHOSCOPY N/A 03/27/2013   Procedure: VIDEO BRONCHOSCOPY;  Surgeon: Grace Isaac, MD;  Location[facility];  Service: Thoracic;  Laterality: N/A;     Current Meds  Medication Sig  . acetaminophen (TYLENOL) 500 MG tablet Take 1,000 mg by mouth every 6 (six) hours as needed for mild pain or moderate pain. For pain  . allopurinol (ZYLOPRIM) 100 MG tablet Take 100 mg by mouth daily.  Marland Kitchen aspirin EC 81 MG tablet Take 1 tablet (81 mg total) by mouth daily.  . carboxymethylcellulose (REFRESH PLUS) 0.5 % SOLN Place 2 drops into both eyes 3 (three) times daily as needed (dry eyes).   . carvedilol (COREG) 3.125 MG tablet TAKE 1 TABLET BY MOUTH TWICE A DAY  . COLCRYS 0.6 MG tablet Take 0.6 mg by mouth daily.  . Cyanocobalamin (B-12 PO) Take 1 tablet by mouth daily.  . diphenoxylate-atropine (LOMOTIL) 2.5-0.025 MG tablet Take 1 tablet by mouth 4 (four) times daily as needed for diarrhea or loose stools.  Marland Kitchen esomeprazole (NEXIUM) 40  MG capsule Take 40 mg by mouth every evening.  . fexofenadine (ALLEGRA) 180 MG tablet Take 180 mg by mouth daily. For Allergies  . gabapentin (NEURONTIN) 100 MG capsule Take 100 mg by mouth at bedtime.   . hydrochlorothiazide (HYDRODIURIL) 12.5 MG tablet TAKE 1 TABLET (12.5 MG TOTAL) BY MOUTH DAILY. NEED MD APPT  . isosorbide mononitrate (IMDUR) 30 MG 24 hr tablet TAKE 1 TABLET (30 MG TOTAL) BY MOUTH 2 (TWO) TIMES DAILY.  Marland Kitchen linagliptin (TRADJENTA) 5 MG TABS tablet Take 5 mg by mouth daily after breakfast.  . meclizine (ANTIVERT) 25 MG tablet Take 25 mg by mouth 2 (two) times daily as needed.  . Multiple Vitamins-Minerals (WOMENS MULTIVITAMIN PO) Take by mouth daily.  . nitroGLYCERIN (NITROSTAT) 0.4 MG SL tablet Place 1 tablet (0.4 mg total) under the tongue every 5 (five) minutes as needed for chest pain.  Marland Kitchen OVER THE COUNTER MEDICATION Hair, Skin and nails vitamin  . potassium chloride SA (KLOR-CON M20) 20 MEQ  tablet Take 1 tablet (20 mEq total) by mouth daily.  . ramipril (ALTACE) 5 MG capsule Take 5 mg by mouth daily with breakfast.  . simvastatin (ZOCOR) 40 MG tablet Take 40 mg by mouth at bedtime.  . vitamin C (ASCORBIC ACID) 500 MG tablet Take 500 mg by mouth daily.     Allergies:   Aspirin   Social History   Tobacco Use  . Smoking status: Never Smoker  . Smokeless tobacco: Never Used  Substance Use Topics  . Alcohol use: No  . Drug use: No     Family Hx: The patient's family history includes Cancer in her maternal aunt.  ROS:   Please see the history of present illness.     All other systems reviewed and are negative.   Prior CV studies:   The following studies were reviewed today:  Myoview 06/13/2019  Nuclear stress EF: 62%.  There was no ST segment deviation noted during stress.  No T wave inversion was noted during stress.  This is a low risk study.   Normal perfusion. LVEF 62% with normal wall motion. This is a low risk study. No change compared to prior in 2017.   Labs/Other Tests and Data Reviewed:    EKG:  An ECG dated 06/04/2019 was personally reviewed today and demonstrated:  Sinus rhythm without significant ST-T wave changes.  Recent Labs: No results found for requested labs within last 8760 hours.   Recent Lipid Panel Lab Results  Component Value Date/Time   CHOL 151 11/07/2017 08:42 AM   TRIG 87 11/07/2017 08:42 AM   HDL 56 11/07/2017 08:42 AM   CHOLHDL 2.7 11/07/2017 08:42 AM   CHOLHDL 3.3 06/17/2015 05:02 AM   LDLCALC 78 11/07/2017 08:42 AM    Wt Readings from Last 3 Encounters:  09/23/20 210 lb (95.3 kg)  06/26/20 208 lb 4.8 oz (94.5 kg)  12/27/19 215 lb 8 oz (97.8 kg)     Risk Assessment/Calculations:      Objective:    Vital Signs:  BP (!) 146/79   Pulse 95   Ht 5\' 6"  (1.676 m)   Wt 210 lb (95.3 kg)   BMI 33.89 kg/m    VITAL SIGNS:  reviewed  ASSESSMENT & PLAN:    1. Dyspnea: Patient has been having increased dyspnea  recently.  I recommended echocardiogram 2. Chronic atypical chest pain: Last Myoview obtained in December 2020 was negative. 3. Hypertension: Continue on current therapy, move ramipril to nighttime.  I  suspect this medication changes will make her labile blood pressure more even.  4. Hyperlipidemia: Continue Zocor  5. DM2: Managed by primary care provider  6. CAD: Last cardiac catheterization in 2017 showed minimal disease.        COVID-19 Education: The signs and symptoms of COVID-19 were discussed with the patient and how to seek care for testing (follow up with PCP or arrange E-visit).  The importance of social distancing was discussed today.  Time:   Today, I have spent 15 minutes with the patient with telehealth technology discussing the above problems.     Medication Adjustments/Labs and Tests Ordered: Current medicines are reviewed at length with the patient today.  Concerns regarding medicines are outlined above.   Tests Ordered: Orders Placed This Encounter  Procedures  . ECHOCARDIOGRAM COMPLETE    Medication Changes: No orders of the defined types were placed in this encounter.   Follow Up:  In Person in 3 month(s)  Signed, [name], [place]  09/23/2020 5:05 PM    [facility]

## 2020-09-24 ENCOUNTER — Telehealth: Payer: Self-pay | Admitting: Physician Assistant

## 2020-09-24 NOTE — Telephone Encounter (Signed)
Spoke with patient regarding her 3-4 month follow up with Dr. Debara Pickett scheduled 01/14/21 at 1:30 pm.  Will mail information to patient and she voiced her understanding.

## 2020-09-25 ENCOUNTER — Encounter: Payer: Self-pay | Admitting: Physician Assistant

## 2020-09-26 ENCOUNTER — Other Ambulatory Visit: Payer: Self-pay | Admitting: Internal Medicine

## 2020-09-29 ENCOUNTER — Telehealth: Payer: Self-pay | Admitting: Internal Medicine

## 2020-09-29 ENCOUNTER — Observation Stay (HOSPITAL_COMMUNITY): Payer: Medicare Other

## 2020-09-29 ENCOUNTER — Emergency Department (HOSPITAL_COMMUNITY): Payer: Medicare Other

## 2020-09-29 ENCOUNTER — Other Ambulatory Visit: Payer: Self-pay

## 2020-09-29 ENCOUNTER — Observation Stay (HOSPITAL_COMMUNITY)
Admission: EM | Admit: 2020-09-29 | Discharge: 2020-09-30 | Disposition: A | Discharge status: Home / Self Care | Payer: Medicare Other | Attending: Family Medicine | Admitting: Family Medicine

## 2020-09-29 ENCOUNTER — Encounter (HOSPITAL_COMMUNITY): Payer: Self-pay

## 2020-09-29 DIAGNOSIS — Z20822 Contact with and (suspected) exposure to covid-19: Secondary | ICD-10-CM | POA: Diagnosis not present

## 2020-09-29 DIAGNOSIS — Z85038 Personal history of other malignant neoplasm of large intestine: Secondary | ICD-10-CM | POA: Insufficient documentation

## 2020-09-29 DIAGNOSIS — E669 Obesity, unspecified: Secondary | ICD-10-CM | POA: Diagnosis not present

## 2020-09-29 DIAGNOSIS — I10 Essential (primary) hypertension: Secondary | ICD-10-CM | POA: Diagnosis not present

## 2020-09-29 DIAGNOSIS — E119 Type 2 diabetes mellitus without complications: Secondary | ICD-10-CM | POA: Diagnosis not present

## 2020-09-29 DIAGNOSIS — Z79899 Other long term (current) drug therapy: Secondary | ICD-10-CM | POA: Diagnosis not present

## 2020-09-29 DIAGNOSIS — R079 Chest pain, unspecified: Secondary | ICD-10-CM | POA: Diagnosis not present

## 2020-09-29 DIAGNOSIS — R0789 Other chest pain: Secondary | ICD-10-CM | POA: Diagnosis present

## 2020-09-29 DIAGNOSIS — E1169 Type 2 diabetes mellitus with other specified complication: Secondary | ICD-10-CM | POA: Diagnosis present

## 2020-09-29 DIAGNOSIS — I25110 Atherosclerotic heart disease of native coronary artery with unstable angina pectoris: Secondary | ICD-10-CM | POA: Diagnosis not present

## 2020-09-29 DIAGNOSIS — Z85118 Personal history of other malignant neoplasm of bronchus and lung: Secondary | ICD-10-CM | POA: Diagnosis not present

## 2020-09-29 DIAGNOSIS — M109 Gout, unspecified: Secondary | ICD-10-CM | POA: Diagnosis present

## 2020-09-29 DIAGNOSIS — Z7984 Long term (current) use of oral hypoglycemic drugs: Secondary | ICD-10-CM | POA: Insufficient documentation

## 2020-09-29 DIAGNOSIS — I2510 Atherosclerotic heart disease of native coronary artery without angina pectoris: Secondary | ICD-10-CM | POA: Diagnosis present

## 2020-09-29 DIAGNOSIS — I200 Unstable angina: Secondary | ICD-10-CM

## 2020-09-29 DIAGNOSIS — Z7982 Long term (current) use of aspirin: Secondary | ICD-10-CM | POA: Insufficient documentation

## 2020-09-29 DIAGNOSIS — J45909 Unspecified asthma, uncomplicated: Secondary | ICD-10-CM | POA: Insufficient documentation

## 2020-09-29 LAB — CBC WITH DIFFERENTIAL/PLATELET
Abs Immature Granulocytes: 0.02 10*3/uL (ref 0.00–0.07)
Basophils Absolute: 0 10*3/uL (ref 0.0–0.1)
Basophils Relative: 1 %
Eosinophils Absolute: 0.3 10*3/uL (ref 0.0–0.5)
Eosinophils Relative: 5 %
HCT: 39.9 % (ref 36.0–46.0)
Hemoglobin: 12.8 g/dL (ref 12.0–15.0)
Immature Granulocytes: 0 %
Lymphocytes Relative: 35 %
Lymphs Abs: 1.8 10*3/uL (ref 0.7–4.0)
MCH: 27.5 pg (ref 26.0–34.0)
MCHC: 32.1 g/dL (ref 30.0–36.0)
MCV: 85.6 fL (ref 80.0–100.0)
Monocytes Absolute: 0.6 10*3/uL (ref 0.1–1.0)
Monocytes Relative: 11 %
Neutro Abs: 2.4 10*3/uL (ref 1.7–7.7)
Neutrophils Relative %: 48 %
Platelets: 181 10*3/uL (ref 150–400)
RBC: 4.66 MIL/uL (ref 3.87–5.11)
RDW: 14.1 % (ref 11.5–15.5)
WBC: 5 10*3/uL (ref 4.0–10.5)
nRBC: 0 % (ref 0.0–0.2)

## 2020-09-29 LAB — COMPREHENSIVE METABOLIC PANEL
ALT: 29 U/L (ref 0–44)
AST: 34 U/L (ref 15–41)
Albumin: 3.6 g/dL (ref 3.5–5.0)
Alkaline Phosphatase: 86 U/L (ref 38–126)
Anion gap: 9 (ref 5–15)
BUN: 15 mg/dL (ref 8–23)
CO2: 27 mmol/L (ref 22–32)
Calcium: 8.8 mg/dL — ABNORMAL LOW (ref 8.9–10.3)
Chloride: 104 mmol/L (ref 98–111)
Creatinine, Ser: 0.96 mg/dL (ref 0.44–1.00)
GFR, Estimated: >60 mL/min (ref >60)
Glucose, Bld: 140 mg/dL — ABNORMAL HIGH (ref 70–99)
Potassium: 3.7 mmol/L (ref 3.5–5.1)
Sodium: 140 mmol/L (ref 135–145)
Total Bilirubin: 0.4 mg/dL (ref 0.3–1.2)
Total Protein: 7.4 g/dL (ref 6.5–8.1)

## 2020-09-29 LAB — HEMOGLOBIN A1C
Hgb A1c MFr Bld: 7 % — ABNORMAL HIGH (ref 4.8–5.6)
Mean Plasma Glucose: 154.2 mg/dL

## 2020-09-29 LAB — GLUCOSE, CAPILLARY: Glucose-Capillary: 89 mg/dL (ref 70–99)

## 2020-09-29 LAB — TROPONIN I (HIGH SENSITIVITY)
Troponin I (High Sensitivity): 4 ng/L (ref <18)
Troponin I (High Sensitivity): 4 ng/L (ref <18)

## 2020-09-29 LAB — RESP PANEL BY RT-PCR (FLU A&B, COVID) ARPGX2
Influenza A by PCR: NEGATIVE
Influenza B by PCR: NEGATIVE
SARS Coronavirus 2 by RT PCR: NEGATIVE

## 2020-09-29 MED ORDER — INSULIN ASPART 100 UNIT/ML ~~LOC~~ SOLN
0.0000 [IU] | Freq: Three times a day (TID) | SUBCUTANEOUS | Status: DC
  Administered 2020-09-30: 2 [IU] via SUBCUTANEOUS

## 2020-09-29 MED ORDER — LORATADINE 10 MG PO TABS
10.0000 mg | ORAL_TABLET | Freq: Every day | ORAL | Status: DC
  Administered 2020-09-29 – 2020-09-30 (×2): 10 mg via ORAL
  Filled 2020-09-29 (×2): qty 1

## 2020-09-29 MED ORDER — GABAPENTIN 100 MG PO CAPS
100.0000 mg | ORAL_CAPSULE | Freq: Every day | ORAL | Status: DC
  Administered 2020-09-29: 100 mg via ORAL
  Filled 2020-09-29: qty 1

## 2020-09-29 MED ORDER — MECLIZINE HCL 12.5 MG PO TABS
25.0000 mg | ORAL_TABLET | Freq: Every day | ORAL | Status: DC
  Administered 2020-09-29 – 2020-09-30 (×2): 25 mg via ORAL
  Filled 2020-09-29 (×2): qty 2

## 2020-09-29 MED ORDER — HYDROCHLOROTHIAZIDE 25 MG PO TABS
12.5000 mg | ORAL_TABLET | Freq: Every day | ORAL | Status: DC
  Administered 2020-09-30: 12.5 mg via ORAL
  Filled 2020-09-29: qty 1

## 2020-09-29 MED ORDER — NITROGLYCERIN 0.4 MG SL SUBL: 0.4000 mg | SUBLINGUAL_TABLET | SUBLINGUAL | 3 refills | Status: DC | PRN

## 2020-09-29 MED ORDER — COLCHICINE 0.6 MG PO TABS
0.6000 mg | ORAL_TABLET | Freq: Every day | ORAL | Status: DC
  Administered 2020-09-30: 0.6 mg via ORAL
  Filled 2020-09-29: qty 1

## 2020-09-29 MED ORDER — PANTOPRAZOLE SODIUM 40 MG PO TBEC
40.0000 mg | DELAYED_RELEASE_TABLET | Freq: Every day | ORAL | Status: DC
  Administered 2020-09-29 – 2020-09-30 (×2): 40 mg via ORAL
  Filled 2020-09-29 (×2): qty 1

## 2020-09-29 MED ORDER — NITROGLYCERIN 0.4 MG SL SUBL
0.4000 mg | SUBLINGUAL_TABLET | Freq: Once | SUBLINGUAL | Status: AC
  Administered 2020-09-29: 0.4 mg via SUBLINGUAL

## 2020-09-29 MED ORDER — POLYETHYLENE GLYCOL 3350 17 G PO PACK
17.0000 g | PACK | Freq: Every day | ORAL | Status: DC | PRN
Start: 2020-09-29 — End: 2020-09-30

## 2020-09-29 MED ORDER — POTASSIUM CHLORIDE CRYS ER 20 MEQ PO TBCR
20.0000 meq | EXTENDED_RELEASE_TABLET | Freq: Every day | ORAL | Status: DC
  Administered 2020-09-30: 20 meq via ORAL
  Filled 2020-09-29: qty 1

## 2020-09-29 MED ORDER — NITROGLYCERIN 0.4 MG SL SUBL
SUBLINGUAL_TABLET | SUBLINGUAL | Status: AC
  Filled 2020-09-29: qty 1

## 2020-09-29 MED ORDER — SIMVASTATIN 20 MG PO TABS
40.0000 mg | ORAL_TABLET | Freq: Every day | ORAL | Status: DC
  Administered 2020-09-29: 40 mg via ORAL
  Filled 2020-09-29: qty 2

## 2020-09-29 MED ORDER — CARVEDILOL 3.125 MG PO TABS
3.1250 mg | ORAL_TABLET | Freq: Two times a day (BID) | ORAL | Status: DC
  Administered 2020-09-29 – 2020-09-30 (×2): 3.125 mg via ORAL
  Filled 2020-09-29 (×2): qty 1

## 2020-09-29 MED ORDER — ACETAMINOPHEN 650 MG RE SUPP: 650.0000 mg | Freq: Four times a day (QID) | RECTAL | Status: DC | PRN

## 2020-09-29 MED ORDER — RAMIPRIL 5 MG PO CAPS
5.0000 mg | ORAL_CAPSULE | Freq: Every evening | ORAL | Status: DC
  Administered 2020-09-29: 5 mg via ORAL
  Filled 2020-09-29: qty 1

## 2020-09-29 MED ORDER — NITROGLYCERIN 0.4 MG SL SUBL
SUBLINGUAL_TABLET | SUBLINGUAL | Status: AC
  Administered 2020-09-29: 0.4 mg via SUBLINGUAL
  Filled 2020-09-29: qty 1

## 2020-09-29 MED ORDER — NITROGLYCERIN 0.4 MG SL SUBL
0.4000 mg | SUBLINGUAL_TABLET | SUBLINGUAL | Status: DC | PRN
Start: 2020-09-29 — End: 2020-09-30

## 2020-09-29 MED ORDER — ACETAMINOPHEN 325 MG PO TABS
650.0000 mg | ORAL_TABLET | Freq: Four times a day (QID) | ORAL | Status: DC | PRN
  Administered 2020-09-29: 650 mg via ORAL
  Filled 2020-09-29 (×2): qty 2

## 2020-09-29 MED ORDER — ALLOPURINOL 100 MG PO TABS
100.0000 mg | ORAL_TABLET | Freq: Every day | ORAL | Status: DC
  Administered 2020-09-30: 100 mg via ORAL
  Filled 2020-09-29: qty 1

## 2020-09-29 MED ORDER — INSULIN ASPART 100 UNIT/ML ~~LOC~~ SOLN: 0.0000 [IU] | Freq: Every day | SUBCUTANEOUS | Status: DC

## 2020-09-29 MED ORDER — ASPIRIN EC 81 MG PO TBEC
81.0000 mg | DELAYED_RELEASE_TABLET | Freq: Every day | ORAL | Status: DC
Start: 2020-09-30 — End: 2020-09-30
  Administered 2020-09-30: 81 mg via ORAL
  Filled 2020-09-29: qty 1

## 2020-09-29 MED ORDER — ENOXAPARIN SODIUM 40 MG/0.4ML ~~LOC~~ SOLN
40.0000 mg | SUBCUTANEOUS | Status: DC
  Administered 2020-09-29: 40 mg via SUBCUTANEOUS
  Filled 2020-09-29: qty 0.4

## 2020-09-29 MED ORDER — ISOSORBIDE MONONITRATE ER 60 MG PO TB24
30.0000 mg | ORAL_TABLET | Freq: Two times a day (BID) | ORAL | Status: DC
  Administered 2020-09-29 – 2020-09-30 (×2): 30 mg via ORAL
  Filled 2020-09-29 (×2): qty 1

## 2020-09-29 NOTE — Telephone Encounter (Signed)
*  STAT* If patient is at the pharmacy, call can be transferred to refill team.   1. Which medications need to be refilled? (please list name of each medication and dose if known) nitroGLYCERIN (NITROSTAT) 0.4 MG SL tablet(Expired)  2. Which pharmacy/location (including street and city if local pharmacy) is medication to be sent to? [id number] - [place]  3. Do they need a 30 day or 90 day supply? 30 day   Pt is completely out of medication.

## 2020-09-29 NOTE — Telephone Encounter (Signed)
Pt c/o of Chest Pain: STAT if CP now or developed within 24 hours  1. Are you having CP right now? Yes, like a knot of tightness in the center of her chest   2. Are you experiencing any other symptoms (ex. SOB, nausea, vomiting, sweating)? Pain in right arm, SOB, and sweating   3. How long have you been experiencing CP? Since her appt last week off and on   4. Is your CP continuous or coming and going? Coming and going   5. Have you taken Nitroglycerin? No, doesn't have any sending refill request now.  ?

## 2020-09-29 NOTE — H&P (Signed)
History and Physical    Peggy French:[id number] DOB: 06-29-1941 DOA: 09/29/2020  PCP: Iona Beard, MD   Patient coming from: Home  I have personally briefly reviewed patient's old medical records in [place]  Chief Complaint: Chest Pain  HPI: Peggy French is a 80 y.o. female with medical history significant for hypertension, diabetes mellitus, mild coronary artery disease, gout. Patient presented to the ED with complaints of intermittent chest pain over the past week.  Patient has a history of chronic chest pains, but this episode started about a week ago.   Chest pain is located in her lower chest/upper abdomen, pressure-like, intermittent. Her chest pain and relief of symptoms are unrelated to activity.  Today her chest pain radiated down her right arm. She reports associated difficulty breathing sometimes.  She reports improvement in chest pain with belching on one occasion.  Here in the ED her chest pain improved after 2 doses of nitroglycerin. She reports compliance to medication.  Compared to her prior chest pains, she feels this chest pain is increasing in severity.  She reports compliance with her medications which include aspirin carvedilol and Imdur.  Currently chest pain-free.  Never smoked cigarettes, denies alcohol or illicit drug use.  ED Course: Heart rate 80s - 91, O2 sats greater than 95% on room air.  Portable chest x-ray shows hazy opacity in left lower lobe otherwise unremarkable.  Troponin 4.  EKG without significant changes. EDP talked to cardiologist Dr. Harl Bowie, recommended admission for observation.  Review of Systems: As per HPI all other systems reviewed and negative.  Past Medical History:  Diagnosis Date  . Asthmatic bronchitis 2011  . 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 s/p right hemicolectomy/chemo 2013 with mets to the lung s/p LLL wedge resection 2014.  . Diabetes mellitus    x over 10 yrs  . GERD  (gastroesophageal reflux disease)   . Gout   . H/O hiatal hernia   . History of colon cancer   . History of shingles   . Hyperlipidemia   . Hypertension    EKG 10/12 EPIC, chest- 1 view  6/13 EPIC    Last 2D Echo on 05/02/2012 showed EF of greater than 55%  . Microcytic anemia   . Mild CAD    a. cath by Dr. Debara Pickett in 03/2011 - 20-30% ostial bifurcation stenosis of LAD, otherwise only mild luminal irregularities in LAD/RCA, LVEF 60%, no RWMA.   c. 06/18/16 LHC after abnormal nuc showed mild non obst CAD  . Neuropathy   . Obesity     Past Surgical History:  Procedure Laterality Date  . ABDOMINAL HYSTERECTOMY    . APPENDECTOMY    . CARDIAC CATHETERIZATION  [phone]  . CARDIAC CATHETERIZATION N/A 06/18/2016   Procedure: Left Heart Cath and Coronary Angiography;  Surgeon: Nelva Bush, MD;  Location: [place] [facility];  Service: Cardiovascular;  Laterality: N/A;  . CATARACT EXTRACTION W/PHACO Left 02/17/2015   Procedure: CATARACT EXTRACTION PHACO AND INTRAOCULAR LENS PLACEMENT (IOC);  Surgeon: Tonny Branch, MD;  Location: [facility];  Service: Ophthalmology;  Laterality: Left;  CDE:8.01   . CHOLECYSTECTOMY    . COLON SURGERY     PARTIAL COLECTOMY 30 YRS / AGAIN 2013  . COLONOSCOPY  11/26/2011   Procedure: COLONOSCOPY;  Surgeon: Rogene Houston, MD;  [facility];  Service: Endoscopy;  Laterality: N/A;  2:15  . COLONOSCOPY N/A 12/08/2012   Procedure: COLONOSCOPY;  Surgeon: Mechele Dawley  Laural Golden, MD;  [facility];  Service: Endoscopy;  Laterality: N/A;  815-moved to 0950 Ann to notify pt  . COLONOSCOPY N/A 03/25/2016   Procedure: COLONOSCOPY;  Surgeon: Rogene Houston, MD;  [facility];  Service: Endoscopy;  Laterality: N/A;  1200  . ESOPHAGOGASTRODUODENOSCOPY  06/16/2011   Procedure: ESOPHAGOGASTRODUODENOSCOPY (EGD);  Surgeon: Rogene Houston, MD;  [facility];  Service: Endoscopy;  Laterality: N/A;  2:15  . PORT-A-CATH REMOVAL N/A 06/04/2014   Procedure:  REMOVAL PORT-A-CATH;  Surgeon: Pedro Earls, MD;  Location: [facility];  Service: General;  Laterality: N/A;  . PORTACATH PLACEMENT  02/22/2012   Procedure: INSERTION PORT-A-CATH;  Surgeon: Pedro Earls, MD;  [facility];  Service: General;  Laterality: N/A;  left subclavian  . VIDEO ASSISTED THORACOSCOPY (VATS)/WEDGE RESECTION Left 03/27/2013   Procedure: VIDEO ASSISTED THORACOSCOPY (VATS)/WEDGE RESECTION;  Surgeon: Grace Isaac, MD;  Location: [place];  Service: Thoracic;  Laterality: Left;  Marland Kitchen VIDEO BRONCHOSCOPY N/A 03/27/2013   Procedure: VIDEO BRONCHOSCOPY;  Surgeon: Grace Isaac, MD;  Location: [place];  Service: Thoracic;  Laterality: N/A;     reports that she has never smoked. She has never used smokeless tobacco. She reports that she does not drink alcohol and does not use drugs.  Allergies  Allergen Reactions  . Aspirin Palpitations    Family History  Problem Relation Age of Onset  . Cancer Maternal Aunt        throat,     Prior to Admission medications   Medication Sig Start Date End Date Taking? Authorizing Provider  acetaminophen (TYLENOL) 500 MG tablet Take 1,000 mg by mouth every 6 (six) hours as needed for mild pain or moderate pain. For pain    [provider]  allopurinol (ZYLOPRIM) 100 MG tablet Take 100 mg by mouth daily. 04/13/19   [provider]  aspirin EC 81 MG tablet Take 1 tablet (81 mg total) by mouth daily. 06/17/15   Ghimire, Henreitta Leber, MD  carboxymethylcellulose (REFRESH PLUS) 0.5 % SOLN Place 2 drops into both eyes 3 (three) times daily as needed (dry eyes).     [provider]  carvedilol (COREG) 3.125 MG tablet TAKE 1 TABLET BY MOUTH TWICE A DAY 07/14/20   Hilty, Nadean Corwin, MD  COLCRYS 0.6 MG tablet Take 0.6 mg by mouth daily. 04/11/19   [provider]  Cyanocobalamin (B-12 PO) Take 1 tablet by mouth daily.    [provider]  diphenoxylate-atropine (LOMOTIL) 2.5-0.025 MG tablet Take 1 tablet by mouth  4 (four) times daily as needed for diarrhea or loose stools.    [provider]  esomeprazole (NEXIUM) 40 MG capsule Take 40 mg by mouth every evening. 07/16/11   Rehman, Mechele Dawley, MD  fexofenadine (ALLEGRA) 180 MG tablet Take 180 mg by mouth daily. For Allergies    [provider]  gabapentin (NEURONTIN) 100 MG capsule Take 100 mg by mouth at bedtime.     [provider]  hydrochlorothiazide (HYDRODIURIL) 12.5 MG tablet TAKE 1 TABLET (12.5 MG TOTAL) BY MOUTH DAILY. NEED MD APPT 09/26/20   Pixie Casino, MD  isosorbide mononitrate (IMDUR) 30 MG 24 hr tablet TAKE 1 TABLET (30 MG TOTAL) BY MOUTH 2 (TWO) TIMES DAILY. 07/14/20 10/12/20  Pixie Casino, MD  linagliptin (TRADJENTA) 5 MG TABS tablet Take 5 mg by mouth daily after breakfast.    [provider]  meclizine (ANTIVERT) 25 MG tablet Take  25 mg by mouth 2 (two) times daily as needed. 07/30/20   [provider]  Multiple Vitamins-Minerals (WOMENS MULTIVITAMIN PO) Take by mouth daily.    [provider]  nitroGLYCERIN (NITROSTAT) 0.4 MG SL tablet Place 1 tablet (0.4 mg total) under the tongue every 5 (five) minutes as needed for chest pain. 09/29/20 12/28/20  Hilty, Nadean Corwin, MD  OVER THE COUNTER MEDICATION Hair, Skin and nails vitamin    [provider]  potassium chloride SA (KLOR-CON M20) 20 MEQ tablet Take 1 tablet (20 mEq total) by mouth daily. 11/22/16   Owens Shark, NP  ramipril (ALTACE) 5 MG capsule Take 5 mg by mouth daily with breakfast.    [provider]  simvastatin (ZOCOR) 40 MG tablet Take 40 mg by mouth at bedtime.    [provider]  vitamin C (ASCORBIC ACID) 500 MG tablet Take 500 mg by mouth daily.    [provider]    Physical Exam: Vitals:   09/29/20 1548 09/29/20 1600 09/29/20 1630 09/29/20 1643  BP: (!) 141/75 134/71 132/67 140/84  Pulse: 91 91 87 88  Resp:  18 (!) 23 16  Temp:    98.4 F (36.9 C)  TempSrc:    Oral  SpO2: 95% 94%  95% 99%  Weight:      Height:        Constitutional: NAD, calm, comfortable Vitals:   09/29/20 1548 09/29/20 1600 09/29/20 1630 09/29/20 1643  BP: (!) 141/75 134/71 132/67 140/84  Pulse: 91 91 87 88  Resp:  18 (!) 23 16  Temp:    98.4 F (36.9 C)  TempSrc:    Oral  SpO2: 95% 94% 95% 99%  Weight:      Height:       Eyes: Pupils equal, lids and conjunctivae normal ENMT: Mucous membranes are moist. Neck: normal, supple, no masses, no thyromegaly Respiratory: clear to auscultation bilaterally, no wheezing, no crackles. Normal respiratory effort. No accessory muscle use.  Cardiovascular: Regular rate and rhythm, no murmurs / rubs / gallops. No extremity edema. 2+ pedal pulses.  Abdomen: no tenderness, no masses palpated. No hepatosplenomegaly. Musculoskeletal: no clubbing / cyanosis. No joint deformity upper and lower extremities. Good ROM, no contractures. Normal muscle tone.  Skin: no rashes, lesions, ulcers. No induration Neurologic: No apparent cranial formality, moving extremities spontaneously Psychiatric: Normal judgment and insight. Alert and oriented x 3. Normal mood.   Labs on Admission: I have personally reviewed following labs and imaging studies  CBC: Recent Labs  Lab 09/29/20 1536  WBC 5.0  NEUTROABS 2.4  HGB 12.8  HCT 39.9  MCV 85.6  PLT 086   Basic Metabolic Panel: Recent Labs  Lab 09/29/20 1536  NA 140  K 3.7  CL 104  CO2 27  GLUCOSE 140*  BUN 15  CREATININE 0.96  CALCIUM 8.8*   Liver Function Tests: Recent Labs  Lab 09/29/20 1536  AST 34  ALT 29  ALKPHOS 86  BILITOT 0.4  PROT 7.4  ALBUMIN 3.6    Radiological Exams on Admission: DG Chest Port 1 View  Result Date: 09/29/2020 CLINICAL DATA:  Shortness of breath.  Chest pain. EXAM: PORTABLE CHEST 1 VIEW.  Patient is rotated. COMPARISON:  Chest x-ray 06/2016, CT chest 06/27/2019 FINDINGS: The heart size and mediastinal contours are within normal limits. Aortic arch calcification. Hazy  opacity along the left lower lobe likely due to patient rotation. No focal consolidation. Slightly coarsened interstitial markings. No pleural effusion. No  pneumothorax. No acute osseous abnormality. IMPRESSION: 1. No active disease. 2. Hazy opacity along the left lower lobe likely due to patient rotation. Recommend repeat PA and lateral view of the chest for further evaluation. Electronically Signed   By: Iven Finn M.D.   On: 09/29/2020 16:28    EKG: Independently reviewed.  Sinus rhythm, rate 90.  QTc 413.  No significant ST or T wave changes compared to prior EKG.  Assessment/Plan Principal Problem:   Chest pain Active Problems:   Gout   Essential hypertension   Diabetes mellitus type 2 in obese (HCC)   Mild CAD  Chest pain with history of mild CAD-atypical chest pains, but reported relief with 2 doses of nitroglycerin.  Symptoms may be GI related.  Last cardiac cath 2017- mild to moderate nonobstructive CAD up to 30%.  Lexiscan stress test 06/2019- deemed low risk, no ischemia. - EDP talked with Dr. Harl Bowie, observation recommended -Initial plans for outpatient echo, will order while hospitalized - Trend Troponin -EKG in the morning - Resume esomeprazole 40 daily -Resume aspirin, simvastatin, carvedilol, Imdur. -As needed nitroglycerin -N.p.o. midnight -Follow-up repeat chest x-ray in a.m.  Diabetes mellitus-random glucose 140. - HgbA1c - SSI- S -Hold home Tradjenta for now  Hypertension-stable. - Resume carvedilol, HCTZ 12.5, Imdur, ramipril.  Gout -Resume home allopurinol, colchicine  History of stage III adenocarcinoma of the colon status post right hemicolectomy and chemotherapy 2013.  Follows with [name].  Currently under surveillance.   - Chest x-ray today showing hazy opacity left lower lobe repeat chest x-ray recommended, ordered for a.m.   DVT prophylaxis: Lovenox Code Status: Full code Family Communication: None at bedside Disposition Plan:  ~ 1  days Consults called: Cardiology Admission status: Obs, tele   Bethena Roys MD [facility]  09/29/2020, 6:14 PM

## 2020-09-29 NOTE — ED Triage Notes (Signed)
Pt to er, pt states that she has been having chest pain off and on since last week, states that she has been seeing a cardiologist for a long time.  States that she tried to call her cardiologist for some nitro but they said that she should go to the er.  Pt talking in full sentences.  States that her pain was relieved a little bit with belching pt sinus rhythm on the monitor.

## 2020-09-29 NOTE — ED Provider Notes (Signed)
[facility] EMERGENCY DEPARTMENT Provider Note   CSN: [id number] Arrival date & time: 09/29/20  1508     History Chief Complaint  Patient presents with  . Chest Pain    Peggy French is a 80 y.o. female.  Patient complains of chest discomfort occurring more frequently over the last week.  The pain is worse with exertion.  Patient started with pain this morning and continued until she got to Dr. Is here in the afternoon.  The history is provided by the patient and medical records. No language interpreter was used.  Chest Pain Pain location:  Substernal area Pain quality: aching   Pain radiates to:  Does not radiate Pain severity:  Moderate Onset quality:  Sudden Timing:  Constant Progression:  Worsening Chronicity:  Recurrent Context: not breathing   Relieved by:  Nothing Associated symptoms: no abdominal pain, no back pain, no cough, no fatigue and no headache        Past Medical History:  Diagnosis Date  . Asthmatic bronchitis 2011  . 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 s/p right hemicolectomy/chemo 2013 with mets to the lung s/p LLL wedge resection 2014.  . Diabetes mellitus    x over 10 yrs  . GERD (gastroesophageal reflux disease)   . Gout   . H/O hiatal hernia   . History of colon cancer   . History of shingles   . Hyperlipidemia   . Hypertension    EKG 10/12 EPIC, chest- 1 view  6/13 EPIC    Last 2D Echo on 05/02/2012 showed EF of greater than 55%  . Microcytic anemia   . Mild CAD    a. cath by Dr. Debara Pickett in 03/2011 - 20-30% ostial bifurcation stenosis of LAD, otherwise only mild luminal irregularities in LAD/RCA, LVEF 60%, no RWMA.   c. 06/18/16 LHC after abnormal nuc showed mild non obst CAD  . Neuropathy   . Obesity     Patient Active Problem List   Diagnosis Date Noted  . Chest pain 09/29/2020  . Moderate risk chest pain 06/24/2016  . Mixed hyperlipidemia 06/24/2016  . Unstable angina ([place]) 06/19/2016  . Mild CAD   . Chest  pain with moderate risk of acute coronary syndrome 06/17/2015  . Diabetes mellitus type 2 in obese ([place]) 06/17/2015  . Obesity   . 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 S/P right colectomy 01/07/2012  . 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 Gout   . Essential hypertension   . Dyslipidemia     Past Surgical History:  Procedure Laterality Date  . ABDOMINAL HYSTERECTOMY    . APPENDECTOMY    . CARDIAC CATHETERIZATION  [id number]  . CARDIAC CATHETERIZATION N/A 06/18/2016   Procedure: Left Heart Cath and Coronary Angiography;  Surgeon: Nelva Bush, MD;  Location: [place] [facility];  Service: Cardiovascular;  Laterality: N/A;  . CATARACT EXTRACTION W/PHACO Left 02/17/2015   Procedure: CATARACT EXTRACTION PHACO AND INTRAOCULAR LENS PLACEMENT (IOC);  Surgeon: Tonny Branch, MD;  [facility];  Service: Ophthalmology;  Laterality: Left;  CDE:8.01   . CHOLECYSTECTOMY    . COLON SURGERY     PARTIAL COLECTOMY 30 YRS / AGAIN 2013  . COLONOSCOPY  11/26/2011   Procedure: COLONOSCOPY;  Surgeon: Rogene Houston, MD;  [facility];  Service: Endoscopy;  Laterality: N/A;  2:15  . COLONOSCOPY N/A 12/08/2012   Procedure: COLONOSCOPY;  Surgeon: Rogene Houston, MD;  [facility];  Service: Endoscopy;  Laterality: N/A;  815-moved to 0950 Ann to notify pt  . COLONOSCOPY N/A 03/25/2016   Procedure: COLONOSCOPY;  Surgeon: Rogene Houston, MD;  [facility];  Service: Endoscopy;  Laterality: N/A;  1200  . ESOPHAGOGASTRODUODENOSCOPY  06/16/2011   Procedure: ESOPHAGOGASTRODUODENOSCOPY (EGD);  Surgeon: Rogene Houston, MD;  [facility];  Service: Endoscopy;  Laterality: N/A;  2:15  . PORT-A-CATH REMOVAL N/A 06/04/2014   Procedure: REMOVAL PORT-A-CATH;  Surgeon: Pedro Earls, MD;  [facility];  Service: General;  Laterality: N/A;  . PORTACATH PLACEMENT  02/22/2012   Procedure: INSERTION PORT-A-CATH;  Surgeon: Pedro Earls, MD;   [facility];  Service: General;  Laterality: N/A;  left subclavian  . VIDEO ASSISTED THORACOSCOPY (VATS)/WEDGE RESECTION Left 03/27/2013   Procedure: VIDEO ASSISTED THORACOSCOPY (VATS)/WEDGE RESECTION;  Surgeon: Grace Isaac, MD;  Location: [place];  Service: Thoracic;  Laterality: Left;  Marland Kitchen VIDEO BRONCHOSCOPY N/A 03/27/2013   Procedure: VIDEO BRONCHOSCOPY;  Surgeon: Grace Isaac, MD;  [facility];  Service: Thoracic;  Laterality: N/A;     OB History   No obstetric history on file.     Family History  Problem Relation Age of Onset  . Cancer Maternal Aunt        throat,     Social History   Tobacco Use  . Smoking status: Never Smoker  . Smokeless tobacco: Never Used  Vaping Use  . Vaping Use: Never used  Substance Use Topics  . Alcohol use: No  . Drug use: No    Home Medications Prior to Admission medications   Medication Sig Start Date End Date Taking? Authorizing Provider  acetaminophen (TYLENOL) 500 MG tablet Take 1,000 mg by mouth every 6 (six) hours as needed for mild pain or moderate pain. For pain    [provider]  allopurinol (ZYLOPRIM) 100 MG tablet Take 100 mg by mouth daily. 04/13/19   [provider]  aspirin EC 81 MG tablet Take 1 tablet (81 mg total) by mouth daily. 06/17/15   Ghimire, Henreitta Leber, MD  carboxymethylcellulose (REFRESH PLUS) 0.5 % SOLN Place 2 drops into both eyes 3 (three) times daily as needed (dry eyes).     [provider]  carvedilol (COREG) 3.125 MG tablet TAKE 1 TABLET BY MOUTH TWICE A DAY 07/14/20   Hilty, Nadean Corwin, MD  COLCRYS 0.6 MG tablet Take 0.6 mg by mouth daily. 04/11/19   [provider]  Cyanocobalamin (B-12 PO) Take 1 tablet by mouth daily.    [provider]  diphenoxylate-atropine (LOMOTIL) 2.5-0.025 MG tablet Take 1 tablet by mouth 4 (four) times daily as needed for diarrhea or loose stools.    [provider]  esomeprazole (NEXIUM) 40 MG capsule Take 40 mg by  mouth every evening. 07/16/11   Rehman, Mechele Dawley, MD  fexofenadine (ALLEGRA) 180 MG tablet Take 180 mg by mouth daily. For Allergies    [provider]  gabapentin (NEURONTIN) 100 MG capsule Take 100 mg by mouth at bedtime.     [provider]  hydrochlorothiazide (HYDRODIURIL) 12.5 MG tablet TAKE 1 TABLET (12.5 MG TOTAL) BY MOUTH DAILY. NEED MD APPT 09/26/20   Pixie Casino, MD  isosorbide mononitrate (IMDUR) 30 MG 24 hr tablet TAKE 1 TABLET (30 MG TOTAL) BY MOUTH 2 (TWO) TIMES DAILY. 07/14/20 10/12/20  Pixie Casino, MD  linagliptin (TRADJENTA) 5 MG TABS tablet Take 5 mg by mouth  daily after breakfast.    [provider]  meclizine (ANTIVERT) 25 MG tablet Take 25 mg by mouth 2 (two) times daily as needed. 07/30/20   [provider]  Multiple Vitamins-Minerals (WOMENS MULTIVITAMIN PO) Take by mouth daily.    [provider]  nitroGLYCERIN (NITROSTAT) 0.4 MG SL tablet Place 1 tablet (0.4 mg total) under the tongue every 5 (five) minutes as needed for chest pain. 09/29/20 12/28/20  Hilty, Nadean Corwin, MD  OVER THE COUNTER MEDICATION Hair, Skin and nails vitamin    [provider]  potassium chloride SA (KLOR-CON M20) 20 MEQ tablet Take 1 tablet (20 mEq total) by mouth daily. 11/22/16   Owens Shark, NP  ramipril (ALTACE) 5 MG capsule Take 5 mg by mouth daily with breakfast.    [provider]  simvastatin (ZOCOR) 40 MG tablet Take 40 mg by mouth at bedtime.    [provider]  vitamin C (ASCORBIC ACID) 500 MG tablet Take 500 mg by mouth daily.    [provider]    Allergies    Aspirin  Review of Systems   Review of Systems  Constitutional: Negative for appetite change and fatigue.  HENT: Negative for congestion, ear discharge and sinus pressure.   Eyes: Negative for discharge.  Respiratory: Negative for cough.   Cardiovascular: Positive for chest pain.  Gastrointestinal: Negative for abdominal pain and diarrhea.   Genitourinary: Negative for frequency and hematuria.  Musculoskeletal: Negative for back pain.  Skin: Negative for rash.  Neurological: Negative for seizures and headaches.  Psychiatric/Behavioral: Negative for hallucinations.    Physical Exam Updated Vital Signs BP 136/73   Pulse 86   Temp 98.4 F (36.9 C) (Oral)   Resp 18   Ht 5\' 6"  (1.676 m)   Wt 95.3 kg   SpO2 96%   BMI 33.89 kg/m   Physical Exam Vitals and nursing note reviewed.  Constitutional:      Appearance: She is well-developed.  HENT:     Head: Normocephalic.     Nose: Nose normal.  Eyes:     General: No scleral icterus.    Conjunctiva/sclera: Conjunctivae normal.  Neck:     Thyroid: No thyromegaly.  Cardiovascular:     Rate and Rhythm: Normal rate and regular rhythm.     Heart sounds: No murmur heard. No friction rub. No gallop.   Pulmonary:     Breath sounds: No stridor. No wheezing or rales.  Chest:     Chest wall: No tenderness.  Abdominal:     General: There is no distension.     Tenderness: There is no abdominal tenderness. There is no rebound.  Musculoskeletal:        General: Normal range of motion.     Cervical back: Neck supple.  Lymphadenopathy:     Cervical: No cervical adenopathy.  Skin:    Findings: No erythema or rash.  Neurological:     Mental Status: She is alert and oriented to person, place, and time.     Motor: No abnormal muscle tone.     Coordination: Coordination normal.  Psychiatric:        Behavior: Behavior normal.     ED Results / Procedures / Treatments   Labs (all labs ordered are listed, but only abnormal results are displayed) Labs Reviewed  COMPREHENSIVE METABOLIC PANEL - Abnormal; Notable for the following components:      Result Value   Glucose, Bld 140 (*)    Calcium 8.8 (*)  All other components within normal limits  RESP PANEL BY RT-PCR (FLU A&B, COVID) ARPGX2  CBC WITH DIFFERENTIAL/PLATELET  TROPONIN I (HIGH SENSITIVITY)  TROPONIN I (HIGH  SENSITIVITY)    EKG EKG Interpretation  Date/Time:  Monday September 29 2020 15:18:22 EDT Ventricular Rate:  90 PR Interval:    QRS Duration: 83 QT Interval:  337 QTC Calculation: 413 R Axis:   43 Text Interpretation: Sinus rhythm Low voltage, precordial leads Abnormal R-wave progression, early transition Borderline T abnormalities, anterior leads Confirmed by Milton Ferguson [phone]) on 09/29/2020 3:36:44 PM   Radiology DG Chest Port 1 View  Result Date: 09/29/2020 CLINICAL DATA:  Shortness of breath.  Chest pain. EXAM: PORTABLE CHEST 1 VIEW.  Patient is rotated. COMPARISON:  Chest x-ray 06/2016, CT chest 06/27/2019 FINDINGS: The heart size and mediastinal contours are within normal limits. Aortic arch calcification. Hazy opacity along the left lower lobe likely due to patient rotation. No focal consolidation. Slightly coarsened interstitial markings. No pleural effusion. No pneumothorax. No acute osseous abnormality. IMPRESSION: 1. No active disease. 2. Hazy opacity along the left lower lobe likely due to patient rotation. Recommend repeat PA and lateral view of the chest for further evaluation. Electronically Signed   By: Iven Finn M.D.   On: 09/29/2020 16:28    Procedures Procedures   Medications Ordered in ED Medications  nitroGLYCERIN (NITROSTAT) SL tablet 0.4 mg ( Sublingual Not Given 09/29/20 1642)    ED Course  I have reviewed the triage vital signs and the nursing notes.  Pertinent labs & imaging results that were available during my care of the patient were reviewed by me and considered in my medical decision making (see chart for details).   I spoke with cardiology.  Dr. Laruth Bouchard stated that the patient can be admitted here and ruled out and they will consult in the morning. MDM Rules/Calculators/A&P                         Patient with chest discomfort and normal troponin.  She will be ruled out in the hospital with cardiology consult tomorrow  Final Clinical  Impression(s) / ED Diagnoses Final diagnoses:  Unstable angina [facility])    Rx / DC Orders ED Discharge Orders    None       Milton Ferguson, MD 09/29/20 1722

## 2020-09-29 NOTE — Telephone Encounter (Signed)
Rx(s) sent to pharmacy electronically.  

## 2020-09-29 NOTE — Telephone Encounter (Signed)
Spoke with pt who report she is currently experiencing chest discomfort in the center of her chest that feels like a know. Pt also report increased SOB,sweating, and state pain is radiating down right arm.   Based on current symptoms, nurse recommended pt report to ER for further evaluations. Pt verbalized understanding.

## 2020-09-29 NOTE — Addendum Note (Signed)
Addended by: Fidel Levy on: 09/29/2020 02:47 PM   Modules accepted: Orders

## 2020-09-30 ENCOUNTER — Observation Stay (HOSPITAL_COMMUNITY): Payer: Medicare Other

## 2020-09-30 ENCOUNTER — Observation Stay (HOSPITAL_BASED_OUTPATIENT_CLINIC_OR_DEPARTMENT_OTHER): Payer: Medicare Other

## 2020-09-30 DIAGNOSIS — Z20822 Contact with and (suspected) exposure to covid-19: Secondary | ICD-10-CM | POA: Diagnosis not present

## 2020-09-30 DIAGNOSIS — I38 Endocarditis, valve unspecified: Secondary | ICD-10-CM | POA: Diagnosis not present

## 2020-09-30 DIAGNOSIS — R079 Chest pain, unspecified: Secondary | ICD-10-CM

## 2020-09-30 DIAGNOSIS — E669 Obesity, unspecified: Secondary | ICD-10-CM | POA: Diagnosis not present

## 2020-09-30 DIAGNOSIS — I517 Cardiomegaly: Secondary | ICD-10-CM | POA: Diagnosis not present

## 2020-09-30 DIAGNOSIS — I10 Essential (primary) hypertension: Secondary | ICD-10-CM | POA: Diagnosis not present

## 2020-09-30 DIAGNOSIS — E1169 Type 2 diabetes mellitus with other specified complication: Secondary | ICD-10-CM | POA: Diagnosis not present

## 2020-09-30 DIAGNOSIS — E119 Type 2 diabetes mellitus without complications: Secondary | ICD-10-CM | POA: Diagnosis not present

## 2020-09-30 DIAGNOSIS — J45909 Unspecified asthma, uncomplicated: Secondary | ICD-10-CM | POA: Diagnosis not present

## 2020-09-30 DIAGNOSIS — R0789 Other chest pain: Secondary | ICD-10-CM | POA: Diagnosis not present

## 2020-09-30 LAB — ECHOCARDIOGRAM COMPLETE
AR max vel: 1.6 cm2
AV Area VTI: 1.65 cm2
AV Area mean vel: 1.42 cm2
AV Mean grad: 7.9 mmHg
AV Peak grad: 16 mmHg
Ao pk vel: 2 m/s
Area-P 1/2: 3.77 cm2
Height: 66 in
S' Lateral: 3 cm
Weight: 3360 oz

## 2020-09-30 LAB — LIPID PANEL
Cholesterol: 162 mg/dL (ref 0–200)
HDL: 56 mg/dL (ref >40)
LDL Cholesterol: 92 mg/dL (ref 0–99)
Total CHOL/HDL Ratio: 2.9 RATIO
Triglycerides: 72 mg/dL (ref <150)
VLDL: 14 mg/dL (ref 0–40)

## 2020-09-30 LAB — GLUCOSE, CAPILLARY
Glucose-Capillary: 108 mg/dL — ABNORMAL HIGH (ref 70–99)
Glucose-Capillary: 154 mg/dL — ABNORMAL HIGH (ref 70–99)

## 2020-09-30 MED ORDER — CARVEDILOL 3.125 MG PO TABS: 3.1250 mg | ORAL_TABLET | Freq: Two times a day (BID) | ORAL | Status: DC

## 2020-09-30 MED ORDER — HYDROCHLOROTHIAZIDE 12.5 MG PO TABS: 12.5000 mg | ORAL_TABLET | Freq: Every day | ORAL | Status: DC

## 2020-09-30 NOTE — Discharge Summary (Signed)
Physician Discharge Summary  Peggy French DOB: 1941/04/25 DOA: 09/29/2020  PCP: Iona Beard, MD Cardiologist: Dr. Debara Pickett  Admit date: 09/29/2020 Discharge date: 09/30/2020  Admitted From:  Home  Disposition: Home   Recommendations for Outpatient Follow-up:  1. Follow up with cardiologist as scheduled on 10/29/20  Discharge Condition: STABLE   CODE STATUS: FULL DIET: Heart healthy    Brief Hospitalization Summary: Please see all hospital notes, images, labs for full details of the hospitalization. ADMISSION HPI: Peggy French is a 80 y.o. female with medical history significant for hypertension, diabetes mellitus, mild coronary artery disease, gout.  Patient presented to the ED with complaints of intermittent chest pain over the past week.  Patient has a history of chronic chest pains, but this episode started about a week ago.   Chest pain is located in her lower chest/upper abdomen, pressure-like, intermittent. Her chest pain and relief of symptoms are unrelated to activity.  Today her chest pain radiated down her right arm. She reports associated difficulty breathing sometimes.  She reports improvement in chest pain with belching on one occasion.  Here in the ED her chest pain improved after 2 doses of nitroglycerin.  She reports compliance to medication.  Compared to her prior chest pains, she feels this chest pain is increasing in severity.  She reports compliance with her medications which include aspirin carvedilol and Imdur.   Currently chest pain-free.  Never smoked cigarettes, denies alcohol or illicit drug use.  ED Course: Heart rate 80s - 91, O2 sats greater than 95% on room air.  Portable chest x-ray shows hazy opacity in left lower lobe otherwise unremarkable.  Troponin 4.  EKG without significant changes.  EDP talked to cardiologist Dr. Harl Bowie, recommended admission for observation.  Hospital Course  Pt was seen by cardiology and felt pain has atypical  features. ECG had nonspecific changes. Trops are negative.  Echo on this admission was unremarkable.  They reported that a repeat stress test not indicated at this time.  Continue coreg, HCTZ, Imdur and ramipril.  SL NTG as needed for CP and follow up in cardiology clinic.  They made her an appointment 10/29/20 with Minette Brine.   Discharge Diagnoses:  Principal Problem:   Chest pain Active Problems:   Gout   Essential hypertension   Diabetes mellitus type 2 in obese (HCC)   Mild CAD   Discharge Instructions:  Allergies as of 09/30/2020      Reactions   Aspirin Palpitations      Medication List    TAKE these medications   acetaminophen 500 MG tablet Commonly known as: TYLENOL Take 1,000 mg by mouth every 6 (six) hours as needed for mild pain or moderate pain. For pain   allopurinol 100 MG tablet Commonly known as: ZYLOPRIM Take 100 mg by mouth daily.   aspirin EC 81 MG tablet Take 1 tablet (81 mg total) by mouth daily.   B-12 PO Take 1 tablet by mouth daily.   carboxymethylcellulose 0.5 % Soln Commonly known as: REFRESH PLUS Place 2 drops into both eyes 3 (three) times daily as needed (dry eyes).   carvedilol 3.125 MG tablet Commonly known as: COREG Take 1 tablet (3.125 mg total) by mouth 2 (two) times daily with a meal.   Colcrys 0.6 MG tablet Generic drug: colchicine Take 0.6 mg by mouth daily.   diphenoxylate-atropine 2.5-0.025 MG tablet Commonly known as: LOMOTIL Take 1 tablet by mouth 4 (four) times daily as needed for diarrhea  or loose stools.   esomeprazole 40 MG capsule Commonly known as: NEXIUM Take 40 mg by mouth every evening.   fexofenadine 180 MG tablet Commonly known as: ALLEGRA Take 180 mg by mouth daily. For Allergies   gabapentin 100 MG capsule Commonly known as: NEURONTIN Take 100 mg by mouth at bedtime.   hydrochlorothiazide 12.5 MG tablet Commonly known as: HYDRODIURIL Take 1 tablet (12.5 mg total) by mouth daily. What changed:  See the new instructions.   isosorbide mononitrate 30 MG 24 hr tablet Commonly known as: IMDUR TAKE 1 TABLET (30 MG TOTAL) BY MOUTH 2 (TWO) TIMES DAILY.   linagliptin 5 MG Tabs tablet Commonly known as: TRADJENTA Take 5 mg by mouth daily after breakfast.   meclizine 25 MG tablet Commonly known as: ANTIVERT Take 25 mg by mouth daily.   nitroGLYCERIN 0.4 MG SL tablet Commonly known as: NITROSTAT Place 1 tablet (0.4 mg total) under the tongue every 5 (five) minutes as needed for chest pain.   OVER THE COUNTER MEDICATION Hair, Skin and nails vitamin   potassium chloride SA 20 MEQ tablet Commonly known as: Klor-Con M20 Take 1 tablet (20 mEq total) by mouth daily.   ramipril 5 MG capsule Commonly known as: ALTACE Take 5 mg by mouth every evening.   simvastatin 40 MG tablet Commonly known as: ZOCOR Take 40 mg by mouth at bedtime.   vitamin C 500 MG tablet Commonly known as: ASCORBIC ACID Take 500 mg by mouth daily.   WOMENS MULTIVITAMIN PO Take 1 tablet by mouth daily.       Follow-up Information    Ledora Bottcher, PA Follow up on 10/29/2020.   Specialties: Physician Assistant, Cardiology, Radiology Why: Cardiology Hospital Follow-up on 10/29/2020 at 11:45 AM with Doreene Adas, PA-C (works with Dr. Debara Pickett) Contact information: [address] [place] [address] [phone]              Allergies  Allergen Reactions   Aspirin Palpitations   Allergies as of 09/30/2020      Reactions   Aspirin Palpitations      Medication List    TAKE these medications   acetaminophen 500 MG tablet Commonly known as: TYLENOL Take 1,000 mg by mouth every 6 (six) hours as needed for mild pain or moderate pain. For pain   allopurinol 100 MG tablet Commonly known as: ZYLOPRIM Take 100 mg by mouth daily.   aspirin EC 81 MG tablet Take 1 tablet (81 mg total) by mouth daily.   B-12 PO Take 1 tablet by mouth daily.   carboxymethylcellulose 0.5 %  Soln Commonly known as: REFRESH PLUS Place 2 drops into both eyes 3 (three) times daily as needed (dry eyes).   carvedilol 3.125 MG tablet Commonly known as: COREG Take 1 tablet (3.125 mg total) by mouth 2 (two) times daily with a meal.   Colcrys 0.6 MG tablet Generic drug: colchicine Take 0.6 mg by mouth daily.   diphenoxylate-atropine 2.5-0.025 MG tablet Commonly known as: LOMOTIL Take 1 tablet by mouth 4 (four) times daily as needed for diarrhea or loose stools.   esomeprazole 40 MG capsule Commonly known as: NEXIUM Take 40 mg by mouth every evening.   fexofenadine 180 MG tablet Commonly known as: ALLEGRA Take 180 mg by mouth daily. For Allergies   gabapentin 100 MG capsule Commonly known as: NEURONTIN Take 100 mg by mouth at bedtime.   hydrochlorothiazide 12.5 MG tablet Commonly known as: HYDRODIURIL Take 1 tablet (12.5 mg  total) by mouth daily. What changed: See the new instructions.   isosorbide mononitrate 30 MG 24 hr tablet Commonly known as: IMDUR TAKE 1 TABLET (30 MG TOTAL) BY MOUTH 2 (TWO) TIMES DAILY.   linagliptin 5 MG Tabs tablet Commonly known as: TRADJENTA Take 5 mg by mouth daily after breakfast.   meclizine 25 MG tablet Commonly known as: ANTIVERT Take 25 mg by mouth daily.   nitroGLYCERIN 0.4 MG SL tablet Commonly known as: NITROSTAT Place 1 tablet (0.4 mg total) under the tongue every 5 (five) minutes as needed for chest pain.   OVER THE COUNTER MEDICATION Hair, Skin and nails vitamin   potassium chloride SA 20 MEQ tablet Commonly known as: Klor-Con M20 Take 1 tablet (20 mEq total) by mouth daily.   ramipril 5 MG capsule Commonly known as: ALTACE Take 5 mg by mouth every evening.   simvastatin 40 MG tablet Commonly known as: ZOCOR Take 40 mg by mouth at bedtime.   vitamin C 500 MG tablet Commonly known as: ASCORBIC ACID Take 500 mg by mouth daily.   WOMENS MULTIVITAMIN PO Take 1 tablet by mouth daily.        Procedures/Studies: DG Chest 2 View  Result Date: 09/30/2020 CLINICAL DATA:  Chest pain.  Colon carcinoma EXAM: CHEST - 2 VIEW COMPARISON:  September 29, 2020 chest radiograph; chest CT June 27, 2019 FINDINGS: There is mild scarring in the left base. There is no edema or airspace opacity. Heart size and pulmonary vascularity normal. No adenopathy. There is degenerative change in the thoracic spine. IMPRESSION: Scarring left base. No edema or airspace opacity. Heart size normal. No adenopathy evident. Electronically Signed   By: Lowella Grip III M.D.   On: 09/30/2020 08:41   DG Chest Port 1 View  Result Date: 09/29/2020 CLINICAL DATA:  Shortness of breath.  Chest pain. EXAM: PORTABLE CHEST 1 VIEW.  Patient is rotated. COMPARISON:  Chest x-ray 06/2016, CT chest 06/27/2019 FINDINGS: The heart size and mediastinal contours are within normal limits. Aortic arch calcification. Hazy opacity along the left lower lobe likely due to patient rotation. No focal consolidation. Slightly coarsened interstitial markings. No pleural effusion. No pneumothorax. No acute osseous abnormality. IMPRESSION: 1. No active disease. 2. Hazy opacity along the left lower lobe likely due to patient rotation. Recommend repeat PA and lateral view of the chest for further evaluation. Electronically Signed   By: Iven Finn M.D.   On: 09/29/2020 16:28   ECHOCARDIOGRAM COMPLETE  Result Date: 09/30/2020    ECHOCARDIOGRAM REPORT   Patient Name:   Peggy French Date of Exam: 09/30/2020 Medical Rec #:  [id number]        Height:       66.0 in Accession #:    [id number]       Weight:       210.0 lb Date of Birth:  March 31, 1941       BSA:          2.042 m Patient Age:    12 years         BP:           136/77 mmHg Patient Gender: F                HR:           76 bpm. Exam Location:  [facility] Procedure: 2D Echo Indications:    Chest pain  History:        Patient has prior history of Echocardiogram  examinations, most                  recent 06/17/2015. CAD; Risk Factors:Non-Smoker, Hypertension,                 Diabetes and Dyslipidemia. 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  Sonographer:    Leavy Cella RDCS (AE) Referring Phys: [place]  1. Left ventricular ejection fraction, by estimation, is 55 to 60%. The left ventricle has normal function. The left ventricle has no regional wall motion abnormalities. There is mild left ventricular hypertrophy. Left ventricular diastolic parameters are consistent with Grade I diastolic dysfunction (impaired relaxation).  2. Right ventricular systolic function is normal. The right ventricular size is normal.  3. The mitral valve is normal in structure. No evidence of mitral valve regurgitation. No evidence of mitral stenosis.  4. The aortic valve has an indeterminant number of cusps. There is moderate calcification of the aortic valve. There is moderate thickening of the aortic valve. Aortic valve regurgitation is not visualized. No aortic stenosis is present.  5. The inferior vena cava is normal in size with greater than 50% respiratory variability, suggesting right atrial pressure of 3 mmHg. FINDINGS  Left Ventricle: Left ventricular ejection fraction, by estimation, is 55 to 60%. The left ventricle has normal function. The left ventricle has no regional wall motion abnormalities. The left ventricular internal cavity size was normal in size. There is  mild left ventricular hypertrophy. Left ventricular diastolic parameters are consistent with Grade I diastolic dysfunction (impaired relaxation). Normal left ventricular filling pressure. Right Ventricle: The right ventricular size is normal. No increase in right ventricular wall thickness. Right ventricular systolic function is normal. Left Atrium: Left atrial size was normal in size. Right Atrium: Right atrial size was normal in size. Pericardium: There is no evidence of  pericardial effusion. Mitral Valve: The mitral valve is normal in structure. No evidence of mitral valve regurgitation. No evidence of mitral valve stenosis. Tricuspid Valve: The tricuspid valve is normal in structure. Tricuspid valve regurgitation is not demonstrated. No evidence of tricuspid stenosis. Aortic Valve: The aortic valve has an indeterminant number of cusps. There is moderate calcification of the aortic valve. There is moderate thickening of the aortic valve. There is moderate aortic valve annular calcification. Aortic valve regurgitation is not visualized. No aortic stenosis is present. Aortic valve mean gradient measures 7.9 mmHg. Aortic valve peak gradient measures 16.0 mmHg. Aortic valve area, by VTI measures 1.65 cm. Pulmonic Valve: The pulmonic valve was not well visualized. Pulmonic valve regurgitation is not visualized. No evidence of pulmonic stenosis. Aorta: The aortic root is normal in size and structure. Pulmonary Artery: Indeterminant PASP, inadequate TR jet. Venous: The inferior vena cava is normal in size with greater than 50% respiratory variability, suggesting right atrial pressure of 3 mmHg. IAS/Shunts: No atrial level shunt detected by color flow Doppler.  LEFT VENTRICLE PLAX 2D LVIDd:         4.30 cm  Diastology LVIDs:         3.00 cm  LV e' medial:    8.38 cm/s LV PW:         1.00 cm  LV E/e' medial:  8.8 LV IVS:        1.00 cm  LV e' lateral:   9.57 cm/s LVOT diam:     2.00 cm  LV E/e' lateral: 7.7 LV SV:         69 LV SV Index:   34 LVOT Area:     3.14 cm  RIGHT VENTRICLE RV S prime:     9.79 cm/s TAPSE (M-mode): 2.2 cm LEFT ATRIUM             Index       RIGHT ATRIUM           Index LA Vol (A2C):   42.2 ml 20.67 ml/m RA Area:     13.50 cm LA Vol (A4C):   33.4 ml 16.36 ml/m RA Volume:   36.80 ml  18.02 ml/m LA Biplane Vol: 39.0 ml 19.10 ml/m  AORTIC VALVE AV Area (Vmax):    1.60 cm AV Area (Vmean):   1.42 cm AV Area (VTI):     1.65 cm AV Vmax:           199.81 cm/s AV  Vmean:          131.272 cm/s AV VTI:            0.421 m AV Peak Grad:      16.0 mmHg AV Mean Grad:      7.9 mmHg LVOT Vmax:         101.55 cm/s LVOT Vmean:        59.263 cm/s LVOT VTI:          0.221 m LVOT/AV VTI ratio: 0.53  AORTA Ao Root diam: 2.50 cm MITRAL VALVE MV Area (PHT): 3.77 cm     SHUNTS MV Decel Time: 201 msec     Systemic VTI:  0.22 m MV E velocity: 73.70 cm/s   Systemic Diam: 2.00 cm MV A velocity: 101.00 cm/s MV E/A ratio:  0.73 Carlyle Dolly MD Electronically signed by Carlyle Dolly MD Signature Date/Time: 09/30/2020/9:03:37 AM    Final       Subjective: Pt reports feeling much better today, did not rest well in hospital.    Discharge Exam: Vitals:   09/30/20 0918 09/30/20 1029  BP: (!) 142/76 (!) 149/69  Pulse: 76 82  Resp:  18  Temp:  98 F (36.7 C)  SpO2:  98%   Vitals:   09/30/20 0227 09/30/20 0604 09/30/20 0918 09/30/20 1029  BP: 123/63 136/77 (!) 142/76 (!) 149/69  Pulse: 81 76 76 82  Resp: 17 17  18   Temp: 99.3 F (37.4 C) 99.3 F (37.4 C)  98 F (36.7 C)  TempSrc:  Oral  Oral  SpO2: 96% 99%  98%  Weight:      Height:       General: Pt is alert, awake, not in acute distress Cardiovascular: normal S1/S2 +, no rubs, no gallops Respiratory: CTA bilaterally, no wheezing, no rhonchi Abdominal: Soft, NT, ND, bowel sounds + Extremities: no edema, no cyanosis   The results of significant diagnostics from this hospitalization (including imaging, microbiology, ancillary and laboratory) are listed below for reference.     Microbiology: Recent Results (from the past 240 hour(s))  Resp Panel by RT-PCR (Flu A&B, Covid) Nasopharyngeal Swab     Status: None   Collection Time: 09/29/20  4:44 PM   Specimen: Nasopharyngeal Swab; Nasopharyngeal(NP) swabs in vial transport medium  Result Value Ref Range Status   SARS Coronavirus 2 by RT PCR NEGATIVE NEGATIVE Final    Comment: (NOTE) SARS-CoV-2 target nucleic acids are NOT DETECTED.  The SARS-CoV-2 RNA is  generally detectable in upper respiratory specimens during the acute  phase of infection. The lowest concentration of SARS-CoV-2 viral copies this assay can detect is 138 copies/mL. A negative result does not preclude SARS-Cov-2 infection and should not be used as the sole basis for treatment or other patient management decisions. A negative result may occur with  improper specimen collection/handling, submission of specimen other than nasopharyngeal swab, presence of viral mutation(s) within the areas targeted by this assay, and inadequate number of viral copies(<138 copies/mL). A negative result must be combined with clinical observations, patient history, and epidemiological information. The expected result is Negative.  Fact Sheet for Patients:  EntrepreneurPulse.com.au  Fact Sheet for Healthcare Providers:  IncredibleEmployment.be  This test is no t yet approved or cleared by the [place] FDA and  has been authorized for detection and/or diagnosis of SARS-CoV-2 by FDA under an Emergency Use Authorization (EUA). This EUA will remain  in effect (meaning this test can be used) for the duration of the COVID-19 declaration under Section 564(b)(1) of the Act, 21 U.S.C.section 360bbb-3(b)(1), unless the authorization is terminated  or revoked sooner.       Influenza A by PCR NEGATIVE NEGATIVE Final   Influenza B by PCR NEGATIVE NEGATIVE Final    Comment: (NOTE) The Xpert Xpress SARS-CoV-2/FLU/RSV plus assay is intended as an aid in the diagnosis of influenza from Nasopharyngeal swab specimens and should not be used as a sole basis for treatment. Nasal washings and aspirates are unacceptable for Xpert Xpress SARS-CoV-2/FLU/RSV testing.  Fact Sheet for Patients: EntrepreneurPulse.com.au  Fact Sheet for Healthcare Providers: IncredibleEmployment.be  This test is not yet approved or cleared by the [place] FDA and has been authorized for detection and/or diagnosis of SARS-CoV-2 by FDA under an Emergency Use Authorization (EUA). This EUA will remain in effect (meaning this test can be used) for the duration of the COVID-19 declaration under Section 564(b)(1) of the Act, 21 U.S.C. section 360bbb-3(b)(1), unless the authorization is terminated or revoked.  Performed at [facility], [address] [place], [place] [address]      Labs: BNP (last 3 results) No results for input(s): BNP in the last 8760 hours. Basic Metabolic Panel: Recent Labs  Lab 09/29/20 1536  NA 140  K 3.7  CL 104  CO2 27  GLUCOSE 140*  BUN 15  CREATININE 0.96  CALCIUM 8.8*   Liver Function Tests: Recent Labs  Lab 09/29/20 1536  AST 34  ALT 29  ALKPHOS 86  BILITOT 0.4  PROT 7.4  ALBUMIN 3.6   No results for input(s): LIPASE, AMYLASE in the last 168 hours. No results for input(s): AMMONIA in the last 168 hours. CBC: Recent Labs  Lab 09/29/20 1536  WBC 5.0  NEUTROABS 2.4  HGB 12.8  HCT 39.9  MCV 85.6  PLT 181   Cardiac Enzymes: No results for input(s): CKTOTAL, CKMB, CKMBINDEX, TROPONINI in the last 168 hours. BNP: Invalid input(s): POCBNP CBG: Recent Labs  Lab 09/29/20 1945 09/30/20 0744 09/30/20 1143  GLUCAP 89 108* 154*   D-Dimer No results for input(s): DDIMER in the last 72 hours. Hgb A1c Recent Labs    09/29/20 1756  HGBA1C 7.0*   Lipid Profile Recent Labs    09/30/20 0955  CHOL 162  HDL 56  LDLCALC 92  TRIG 72  CHOLHDL 2.9   Thyroid function studies No results for input(s): TSH, T4TOTAL, T3FREE, THYROIDAB in the last 72 hours.  Invalid input(s): FREET3 Anemia work up No results for input(s): VITAMINB12, FOLATE, FERRITIN, TIBC, IRON, RETICCTPCT in the  last 72 hours. Urinalysis    Component Value Date/Time   COLORURINE YELLOW 03/23/2013 1502   APPEARANCEUR CLOUDY (A) 03/23/2013 1502   LABSPEC 1.017 03/23/2013 1502   PHURINE 6.0 03/23/2013 1502    GLUCOSEU NEGATIVE 03/23/2013 1502   HGBUR NEGATIVE 03/23/2013 1502   BILIRUBINUR NEGATIVE 03/23/2013 1502   KETONESUR NEGATIVE 03/23/2013 1502   PROTEINUR NEGATIVE 03/23/2013 1502   UROBILINOGEN 1.0 03/23/2013 1502   NITRITE NEGATIVE 03/23/2013 1502   LEUKOCYTESUR MODERATE (A) 03/23/2013 1502   Sepsis Labs Invalid input(s): PROCALCITONIN,  WBC,  LACTICIDVEN Microbiology Recent Results (from the past 240 hour(s))  Resp Panel by RT-PCR (Flu A&B, Covid) Nasopharyngeal Swab     Status: None   Collection Time: 09/29/20  4:44 PM   Specimen: Nasopharyngeal Swab; Nasopharyngeal(NP) swabs in vial transport medium  Result Value Ref Range Status   SARS Coronavirus 2 by RT PCR NEGATIVE NEGATIVE Final    Comment: (NOTE) SARS-CoV-2 target nucleic acids are NOT DETECTED.  The SARS-CoV-2 RNA is generally detectable in upper respiratory specimens during the acute phase of infection. The lowest concentration of SARS-CoV-2 viral copies this assay can detect is 138 copies/mL. A negative result does not preclude SARS-Cov-2 infection and should not be used as the sole basis for treatment or other patient management decisions. A negative result may occur with  improper specimen collection/handling, submission of specimen other than nasopharyngeal swab, presence of viral mutation(s) within the areas targeted by this assay, and inadequate number of viral copies(<138 copies/mL). A negative result must be combined with clinical observations, patient history, and epidemiological information. The expected result is Negative.  Fact Sheet for Patients:  EntrepreneurPulse.com.au  Fact Sheet for Healthcare Providers:  IncredibleEmployment.be  This test is no t yet approved or cleared by the [place] FDA and  has been authorized for detection and/or diagnosis of SARS-CoV-2 by FDA under an Emergency Use Authorization (EUA). This EUA will remain  in effect (meaning this  test can be used) for the duration of the COVID-19 declaration under Section 564(b)(1) of the Act, 21 U.S.C.section 360bbb-3(b)(1), unless the authorization is terminated  or revoked sooner.       Influenza A by PCR NEGATIVE NEGATIVE Final   Influenza B by PCR NEGATIVE NEGATIVE Final    Comment: (NOTE) The Xpert Xpress SARS-CoV-2/FLU/RSV plus assay is intended as an aid in the diagnosis of influenza from Nasopharyngeal swab specimens and should not be used as a sole basis for treatment. Nasal washings and aspirates are unacceptable for Xpert Xpress SARS-CoV-2/FLU/RSV testing.  Fact Sheet for Patients: EntrepreneurPulse.com.au  Fact Sheet for Healthcare Providers: IncredibleEmployment.be  This test is not yet approved or cleared by the [place] FDA and has been authorized for detection and/or diagnosis of SARS-CoV-2 by FDA under an Emergency Use Authorization (EUA). This EUA will remain in effect (meaning this test can be used) for the duration of the COVID-19 declaration under Section 564(b)(1) of the Act, 21 U.S.C. section 360bbb-3(b)(1), unless the authorization is terminated or revoked.  Performed at [facility], [address] [place], [place] [address]    Time coordinating discharge:   SIGNED:  Irwin Brakeman, MD  [facility] 09/30/2020, 12:14 PM How to contact the [facility] Attending or Consulting provider [place] or covering provider during after hours [place], for this patient?  1. Check the care team in [facility] and look for a) attending/consulting [facility] provider listed and b) the [facility] team listed 2. Log into [email or web address] and use [place]'s universal  password to access. If you do not have the password, please contact the hospital operator. 3. Locate the [facility] provider you are looking for under [facility] and page to a number that you can be directly reached. 4. If you still have difficulty reaching the provider, please  page the [facility] (Director on Call) for the Hospitalists listed on amion for assistance.

## 2020-09-30 NOTE — Consult Note (Signed)
Cardiology Consultation:   Patient ID: Peggy French MRN: [id number]; DOB: [birth year]  Admit date: 09/29/2020 Date of Consult: 09/30/2020  PCP:  Peggy French, [place]  Cardiologist:  Peggy Casino, MD [phone]    Patient Profile:   Peggy French is a 80 y.o. female with past medical history of CAD (s/p mild CAD by prior caths in 2001, 2012 and 06/2016 with low-risk NST in 06/2019), HTN, HLD, Type 2 DM and history of colon cancer (s/p hemicolectomy and chemotherapy in 2013) who is being seen today for the evaluation of chest pain at the request of Dr. Denton French.  History of Present Illness:   Peggy French most recently had a telehealth visit with Peggy Deforest, PA-C in 09/2020 and reported still having intermittent episodes of chest pain and variable BP. Given her history of chronic atypical chest pain and recent reassuring NST in 06/2019, further ischemic testing was not pursued. It was recommended she take her Ramipril at night to see if this would help with her labile BP readings.   She called the office on 09/29/2020 reporting intermittent chest pain for the past week and was advised to go to the ED for further evaluation. In talking with the patient today, she reports having episodes of sternal chest pain for years which radiates down her right arm. Her episode yesterday lasted throughout the day and did not change with exertional activities. She took SL NTG with some improvement but had not previously had to use this in 6+ months. Symptoms also improved with belching. No reported orthopnea or pitting edema. Does experience occasional night sweats which have been occurring for 20+ years per her report. She does mention her son's birthday was this past weekend and she visited his grave site as he was murdered many years ago and questions if the stress from this is contributing to her symptoms. She denies any recurrent pain this morning.   Initial labs show  WBC 5.0, Hgb 12.8, platelets 181, Na+ 140, K+ 3.7 and creatinine 0.96. Initial HS Troponin 4 with repeat flat at 4. Hgb A1c 7.0. COVID negative. CXR with no acute disease but unable to rule-out hazy opacity along LLL and repeat CXR recommended. EKG shows NSR, HR 90 with nonspecific ST abnormality along the anterior leads but no acute ST changes when compared to prior tracings.    Past Medical History:  Diagnosis Date  . Asthmatic bronchitis 2011  . 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 s/p right hemicolectomy/chemo 2013 with mets to the lung s/p LLL wedge resection 2014.  . Diabetes mellitus    x over 10 yrs  . GERD (gastroesophageal reflux disease)   . Gout   . H/O hiatal hernia   . History of colon cancer   . History of shingles   . Hyperlipidemia   . Hypertension    EKG 10/12 EP[facility], chest- 1 view  6/13 EP[facility]    Last 2D Echo on 05/02/2012 showed EF of greater than 55%  . Microcytic anemia   . Mild CAD    a. cath by Dr. Debara French in 03/2011 - 20-30% ostial bifurcation stenosis of LAD, otherwise only mild luminal irregularities in LAD/RCA, LVEF 60%, no RWMA.   c. 06/18/16 LHC after abnormal nuc showed mild non obst CAD  . Neuropathy   . Obesity     Past Surgical History:  Procedure Laterality Date  . ABDOMINAL HYSTERECTOMY    . APPENDECTOMY    .  CARDIAC CATHETERIZATION  [phone]  . CARDIAC CATHETERIZATION N/A 06/18/2016   Procedure: Left Heart Cath and Coronary Angiography;  Surgeon: Peggy Bush, MD;  Location: [facility];  Service: Cardiovascular;  Laterality: N/A;  . CATARACT EXTRACTION W/PHACO Left 02/17/2015   Procedure: CATARACT EXTRACTION PHACO AND INTRAOCULAR LENS PLACEMENT (IOC);  Surgeon: Tonny Branch, MD;  Location: [facility];  Service: Ophthalmology;  Laterality: Left;  CDE:8.01   . CHOLECYSTECTOMY    . COLON SURGERY     PARTIAL COLECTOMY 30 YRS / AGAIN 2013  . COLONOSCOPY  11/26/2011   Procedure: COLONOSCOPY;  Surgeon: Rogene Houston, MD;  [facility];  Service: Endoscopy;  Laterality: N/A;  2:15  . COLONOSCOPY N/A 12/08/2012   Procedure: COLONOSCOPY;  Surgeon: Rogene Houston, MD;  Location: [facility];  Service: Endoscopy;  Laterality: N/A;  815-moved to 0950 Ann to notify pt  . COLONOSCOPY N/A 03/25/2016   Procedure: COLONOSCOPY;  Surgeon: Rogene Houston, MD;  [facility]: [facility];  Service: Endoscopy;  Laterality: N/A;  1200  . ESOPHAGOGASTRODUODENOSCOPY  06/16/2011   Procedure: ESOPHAGOGASTRODUODENOSCOPY (EGD);  Surgeon: Rogene Houston, MD;  [facility];  Service: Endoscopy;  Laterality: N/A;  2:15  . PORT-A-CATH REMOVAL N/A 06/04/2014   Procedure: REMOVAL PORT-A-CATH;  Surgeon: Pedro Earls, MD;  Location: [facility];  Service: General;  Laterality: N/A;  . PORTACATH PLACEMENT  02/22/2012   Procedure: INSERTION PORT-A-CATH;  Surgeon: Pedro Earls, MD;  Location: [facility];  Service: General;  Laterality: N/A;  left subclavian  . VIDEO ASSISTED THORACOSCOPY (VATS)/WEDGE RESECTION Left 03/27/2013   Procedure: VIDEO ASSISTED THORACOSCOPY (VATS)/WEDGE RESECTION;  Surgeon: Grace Isaac, MD;  Location: [facility];  Service: Thoracic;  Laterality: Left;  Marland Kitchen VIDEO BRONCHOSCOPY N/A 03/27/2013   Procedure: VIDEO BRONCHOSCOPY;  Surgeon: Grace Isaac, MD;  Location: [facility];  Service: Thoracic;  Laterality: N/A;     Home Medications:  Prior to Admission medications   Medication Sig Start Date End Date Taking? Authorizing Provider  acetaminophen (TYLENOL) 500 MG tablet Take 1,000 mg by mouth every 6 (six) hours as needed for mild pain or moderate pain. For pain   Yes [provider]  allopurinol (ZYLOPRIM) 100 MG tablet Take 100 mg by mouth daily. 04/13/19  Yes [provider]  aspirin EC 81 MG tablet Take 1 tablet (81 mg total) by mouth daily. 06/17/15  Yes Ghimire, Henreitta Leber, MD  carboxymethylcellulose (REFRESH PLUS) 0.5 % SOLN Place 2 drops into both eyes 3 (three) times daily as needed (dry eyes).     Yes [provider]  carvedilol (COREG) 3.125 MG tablet TAKE 1 TABLET BY MOUTH TWICE A DAY Patient taking differently: Take 3.125 mg by mouth 2 (two) times daily with a meal. 07/14/20  Yes Hilty, Nadean Corwin, MD  COLCRYS 0.6 MG tablet Take 0.6 mg by mouth daily. 04/11/19  Yes [provider]  Cyanocobalamin (B-12 PO) Take 1 tablet by mouth daily.   Yes [provider]  diphenoxylate-atropine (LOMOTIL) 2.5-0.025 MG tablet Take 1 tablet by mouth 4 (four) times daily as needed for diarrhea or loose stools.   Yes [provider]  esomeprazole (NEXIUM) 40 MG capsule Take 40 mg by mouth every evening. 07/16/11  Yes Rehman, Mechele Dawley, MD  fexofenadine (ALLEGRA) 180 MG tablet Take 180 mg by mouth daily. For Allergies   Yes [provider]  gabapentin (NEURONTIN) 100 MG capsule Take 100 mg by mouth at  bedtime.    Yes [provider]  hydrochlorothiazide (HYDRODIURIL) 12.5 MG tablet TAKE 1 TABLET (12.5 MG TOTAL) BY MOUTH DAILY. NEED MD APPT Patient taking differently: Take 12.5 mg by mouth daily. 09/26/20  Yes Hilty, Nadean Corwin, MD  isosorbide mononitrate (IMDUR) 30 MG 24 hr tablet TAKE 1 TABLET (30 MG TOTAL) BY MOUTH 2 (TWO) TIMES DAILY. 07/14/20 10/12/20 Yes Hilty, Nadean Corwin, MD  linagliptin (TRADJENTA) 5 MG TABS tablet Take 5 mg by mouth daily after breakfast.   Yes [provider]  meclizine (ANTIVERT) 25 MG tablet Take 25 mg by mouth daily. 07/30/20  Yes [provider]  Multiple Vitamins-Minerals (WOMENS MULTIVITAMIN PO) Take 1 tablet by mouth daily.   Yes [provider]  nitroGLYCERIN (NITROSTAT) 0.4 MG SL tablet Place 1 tablet (0.4 mg total) under the tongue every 5 (five) minutes as needed for chest pain. 09/29/20 12/28/20 Yes Hilty, Nadean Corwin, MD  OVER THE COUNTER MEDICATION Hair, Skin and nails vitamin   Yes [provider]  potassium chloride SA (KLOR-CON M20) 20 MEQ tablet Take 1 tablet (20 mEq total) by mouth daily.  11/22/16  Yes Owens Shark, NP  ramipril (ALTACE) 5 MG capsule Take 5 mg by mouth every evening.   Yes [provider]  simvastatin (ZOCOR) 40 MG tablet Take 40 mg by mouth at bedtime.   Yes [provider]  vitamin C (ASCORBIC ACID) 500 MG tablet Take 500 mg by mouth daily.   Yes [provider]    Inpatient Medications: Scheduled Meds: . allopurinol  100 mg Oral Daily  . aspirin EC  81 mg Oral Daily  . carvedilol  3.125 mg Oral BID  . colchicine  0.6 mg Oral Daily  . enoxaparin (LOVENOX) injection  40 mg Subcutaneous Q24H  . gabapentin  100 mg Oral QHS  . hydrochlorothiazide  12.5 mg Oral Daily  . insulin aspart  0-5 Units Subcutaneous QHS  . insulin aspart  0-9 Units Subcutaneous TID WC  . isosorbide mononitrate  30 mg Oral BID  . loratadine  10 mg Oral Daily  . meclizine  25 mg Oral Daily  . pantoprazole  40 mg Oral Daily  . potassium chloride SA  20 mEq Oral Daily  . ramipril  5 mg Oral QPM  . simvastatin  40 mg Oral QHS   Continuous Infusions:  PRN Meds: acetaminophen **OR** acetaminophen, nitroGLYCERIN, polyethylene glycol  Allergies:    Allergies  Allergen Reactions  . Aspirin Palpitations    Social History:   Social History   Socioeconomic History  . Marital status: Married    Spouse name: Not on file  . Number of children: 4  . Years of education: Not on file  . Highest education level: Not on file  Occupational History    Employer: RETIRED  Tobacco Use  . Smoking status: Never Smoker  . Smokeless tobacco: Never Used  Vaping Use  . Vaping Use: Never used  Substance and Sexual Activity  . Alcohol use: No  . Drug use: No  . Sexual activity: Not on file  Other Topics Concern  . Not on file  Social History Narrative   Married to husband, Herbie [place]   Retired Engineer, manufacturing systems   Social Determinants of Radio broadcast assistant Strain: Not on [employer] Insecurity: Not on file  Transportation Needs: Not on file  Physical  Activity: Not on file  Stress: Not on file  Social Connections: Not on file  Intimate  Partner Violence: Not on file    Family History:    Family History  Problem Relation Age of Onset  . Cancer Maternal Aunt        throat,      ROS:  Please see the history of present illness.   All other ROS reviewed and negative.     Physical Exam/Data:   Vitals:   09/29/20 1805 09/29/20 1853 09/30/20 0227 09/30/20 0604  BP: (!) 147/78 (!) 158/80 123/63 136/77  Pulse: 82 79 81 76  Resp:  18 17 17   Temp: 98.2 F (36.8 C) 98.1 F (36.7 C) 99.3 F (37.4 C) 99.3 F (37.4 C)  TempSrc: Oral Oral  Oral  SpO2: 98% 100% 96% 99%  Weight:      Height:       No intake or output data in the 24 hours ending 09/30/20 0746 Last 3 Weights 09/29/2020 09/23/2020 06/26/2020  Weight (lbs) 210 lb 210 lb 208 lb 4.8 oz  Weight (kg) 95.255 kg 95.255 kg 94.484 kg     Body mass index is 33.89 kg/m.  General:  Well nourished female appearing in no acute distress HEENT: normal Lymph: no adenopathy Neck: no JVD Endocrine:  No thryomegaly Vascular: No carotid bruits; FA pulses 2+ bilaterally without bruits  Cardiac:  normal S1, S2; RRR; no murmur  Lungs:  clear to auscultation bilaterally, no wheezing, rhonchi or rales  Abd: soft, nontender, no hepatomegaly  Ext: no edema Musculoskeletal:  No deformities, BUE and BLE strength normal and equal Skin: warm and dry  Neuro:  CNs 2-12 intact, no focal abnormalities noted Psych:  Normal affect   EKG:  The EKG was personally reviewed and demonstrates: NSR, HR 90 with nonspecific ST abnormality along the anterior leads but no acute ST changes when compared to prior tracings.   Telemetry:  Telemetry was personally reviewed and demonstrates: NSR, HR in 70's to 80's.   Relevant CV Studies:  Cardiac Catheterization: 06/2016 Conclusions: 1. Mild to moderate non-obstructive coronary artery disease of up to 30% involving the LAD, LCx, and RCA. 2. Low normal left  ventricular filling pressure (LVEDP 5 mmHg). 3. Normal left ventricular contraction (LVEF 60-65%).  Recommendations: 1. Discontinue nitroglycerin infusion and start isosorbide mononitrate 30 mg daily for possible microvascular disease. Continue amlodipine and propranolol. 2. IV hydration given low normal LVEDP. 3. Continue risk factor modification, including statin therapy.   NST: 06/2019  Nuclear stress EF: 62%.  There was no ST segment deviation noted during stress.  No T wave inversion was noted during stress.  This is a low risk study.   Normal perfusion. LVEF 62% with normal wall motion. This is a low risk study. No change compared to prior in 2017.    Laboratory Data:  High Sensitivity Troponin:   Recent Labs  Lab 09/29/20 1536 09/29/20 1751  TROPONINIHS 4 4     Chemistry Recent Labs  Lab 09/29/20 1536  NA 140  K 3.7  CL 104  CO2 27  GLUCOSE 140*  BUN 15  CREATININE 0.96  CALCIUM 8.8*  GFRNONAA >60  ANIONGAP 9    Recent Labs  Lab 09/29/20 1536  PROT 7.4  ALBUMIN 3.6  AST 34  ALT 29  ALKPHOS 86  BILITOT 0.4   Hematology Recent Labs  Lab 09/29/20 1536  WBC 5.0  RBC 4.66  HGB 12.8  HCT 39.9  MCV 85.6  MCH 27.5  MCHC 32.1  RDW 14.1  PLT 181   BNPNo results for  input(s): BNP, PROBNP in the last 168 hours.  DDimer No results for input(s): DDIMER in the last 168 hours.   Radiology/Studies:  DG Chest Port 1 View  Result Date: 09/29/2020 CLINICAL DATA:  Shortness of breath.  Chest pain. EXAM: PORTABLE CHEST 1 VIEW.  Patient is rotated. COMPARISON:  Chest x-ray 06/2016, CT chest 06/27/2019 FINDINGS: The heart size and mediastinal contours are within normal limits. Aortic arch calcification. Hazy opacity along the left lower lobe likely due to patient rotation. No focal consolidation. Slightly coarsened interstitial markings. No pleural effusion. No pneumothorax. No acute osseous abnormality. IMPRESSION: 1. No active disease. 2. Hazy opacity  along the left lower lobe likely due to patient rotation. Recommend repeat PA and lateral view of the chest for further evaluation. Electronically Signed   By: Iven Finn M.D.   On: 09/29/2020 16:28     Assessment and Plan:   1. Atypical Chest Pain  - She has a history of chronic chest wall pain by review of notes and presented with chest pain which lasted throughout the entire day and was not associated with exertion. Improved with belching and the use of SL NTG.  - Initial HS Troponin 4 with repeat flat at 4. EKG shows NSR, HR 90 with nonspecific ST abnormality along the anterior leads but no acute ST changes when compared to prior tracings. CXR showed a possible opacity with repeat imaging pending.  - An echocardiogram has been performed with the results pending. At this time, her chest pain overall seems atypical for a cardiac etiology. If echocardiogram reassuring, would not anticipate further cardiac testing this admission. She does report increased stress so will need to rule-out Takotsubo cardiomyopathy. Would hold off on titration of Imdur at this time given her variable BP at home but could consider as an outpatient pending her BP readings.   2. CAD - She did have mild CAD by most recent cath in 06/2016 as outlined above with low-risk NST in 06/2019. - Plan for repeat echocardiogram as outlined above.  - Continue ASA 81mg  daily, Coreg 3.125mg  BID, Imdur 30mg  BID and Simvastatin 40mg  daily.  3. HLD - LDL at 78 in 10/2017 with no recent labs available for review. Will recheck FLP. She has remained on Simvastatin 40mg  daily (has been on this dose for several years and tolerated well by review of notes).   4. HTN - BP initially elevated to 175/88, improved to 136/77 on most recent check. Her BP has been variable when checked at home and she reports associated dizziness and fatigue when she is hypotensive.  - Remains on Coreg 3.125mg  BID, HCTZ 12.5mg  daily, Imdur 30mg  BID and Ramipril  5mg  daily for now. If hypotension continues to be an issue, may need to stop Ramipril. Previously had issues with edema so would plan to continue HCTZ at this time.   5. History of Colon Cancer - She is s/p hemicolectomy and chemotherapy in 2013. Followed by Oncology as an outpatient for surveillance.    For questions or updates, please contact [place] Please consult [email or web address] for contact info under    Signed, Erma Heritage, PA-C  09/30/2020 7:46 AM

## 2020-09-30 NOTE — Plan of Care (Signed)

## 2020-09-30 NOTE — Discharge Instructions (Signed)
Nonspecific Chest Pain Chest pain can be caused by many different conditions. Some causes of chest pain can be life-threatening. These will require treatment right away. Serious causes of chest pain include:  Heart attack.  A tear in the body's main blood vessel.  Redness and swelling (inflammation) around your heart.  Blood clot in your lungs. Other causes of chest pain may not be so serious. These include:  Heartburn.  Anxiety or stress.  Damage to bones or muscles in your chest.  Lung infections. Chest pain can feel like:  Pain or discomfort in your chest.  Crushing, pressure, aching, or squeezing pain.  Burning or tingling.  Dull or sharp pain that is worse when you move, cough, or take a deep breath.  Pain or discomfort that is also felt in your back, neck, jaw, shoulder, or arm, or pain that spreads to any of these areas. It is hard to know whether your pain is caused by something that is serious or something that is not so serious. So it is important to see your doctor right away if you have chest pain. Follow these instructions at home: Medicines  Take over-the-counter and prescription medicines only as told by your doctor.  If you were prescribed an antibiotic medicine, take it as told by your doctor. Do not stop taking the antibiotic even if you start to feel better. Lifestyle  Rest as told by your doctor.  Do not use any products that contain nicotine or tobacco, such as cigarettes, e-cigarettes, and chewing tobacco. If you need help quitting, ask your doctor.  Do not drink alcohol.  Make lifestyle changes as told by your doctor. These may include: ? Getting regular exercise. Ask your doctor what activities are safe for you. ? Eating a heart-healthy diet. A diet and nutrition specialist (dietitian) can help you to learn healthy eating options. ? Staying at a healthy weight. ? Treating diabetes or high blood pressure, if needed. ? Lowering your stress.  Activities such as yoga and relaxation techniques can help.   General instructions  Pay attention to any changes in your symptoms. Tell your doctor about them or any new symptoms.  Avoid any activities that cause chest pain.  Keep all follow-up visits as told by your doctor. This is important. You may need more testing if your chest pain does not go away. Contact a doctor if:  Your chest pain does not go away.  You feel depressed.  You have a fever. Get help right away if:  Your chest pain is worse.  You have a cough that gets worse, or you cough up blood.  You have very bad (severe) pain in your belly (abdomen).  You pass out (faint).  You have either of these for no clear reason: ? Sudden chest discomfort. ? Sudden discomfort in your arms, back, neck, or jaw.  You have shortness of breath at any time.  You suddenly start to sweat, or your skin gets clammy.  You feel sick to your stomach (nauseous).  You throw up (vomit).  You suddenly feel lightheaded or dizzy.  You feel very weak or tired.  Your heart starts to beat fast, or it feels like it is skipping beats. These symptoms may be an emergency. Do not wait to see if the symptoms will go away. Get medical help right away. Call your local emergency services (911 in the [place] Do not drive yourself to the hospital. Summary  Chest pain can be caused by many different conditions.  The cause may be serious and need treatment right away. If you have chest pain, see your doctor right away.  Follow your doctor's instructions for taking medicines and making lifestyle changes.  Keep all follow-up visits as told by your doctor. This includes visits for any further testing if your chest pain does not go away.  Be sure to know the signs that show that your condition has become worse. Get help right away if you have these symptoms. This information is not intended to replace advice given to you by your health care provider.  Make sure you discuss any questions you have with your health care provider. Document Revised: 12/29/2017 Document Reviewed: 12/29/2017 Elsevier Patient Education  2021 [employer].   IMPORTANT INFORMATION: PAY CLOSE ATTENTION   PHYSICIAN DISCHARGE INSTRUCTIONS  Follow with Primary care provider  Iona Beard, MD  and other consultants as instructed by your Hospitalist Physician  [place] IF SYMPTOMS COME BACK, WORSEN OR NEW PROBLEM DEVELOPS   Please note: You were cared for by a hospitalist during your hospital stay. Every effort will be made to forward records to your primary care provider.  You can request that your primary care provider send for your hospital records if they have not received them.  Once you are discharged, your primary care physician will handle any further medical issues. Please note that NO REFILLS for any discharge medications will be authorized once you are discharged, as it is imperative that you return to your primary care physician (or establish a relationship with a primary care physician if you do not have one) for your post hospital discharge needs so that they can reassess your need for medications and monitor your lab values.  Please get a complete blood count and chemistry panel checked by your Primary MD at your next visit, and again as instructed by your Primary MD.  Get Medicines reviewed and adjusted: Please take all your medications with you for your next visit with your Primary MD  Laboratory/radiological data: Please request your Primary MD to go over all hospital tests and procedure/radiological results at the follow up, please ask your primary care provider to get all Hospital records sent to his/her office.  In some cases, they will be blood work, cultures and biopsy results pending at the time of your discharge. Please request that your primary care provider follow up on these results.  If you are diabetic,  please bring your blood sugar readings with you to your follow up appointment with primary care.    Please call and make your follow up appointments as soon as possible.    Also Note the following: If you experience worsening of your admission symptoms, develop shortness of breath, life threatening emergency, suicidal or homicidal thoughts you must seek medical attention immediately by calling 911 or calling your MD immediately  if symptoms less severe.  You must read complete instructions/literature along with all the possible adverse reactions/side effects for all the Medicines you take and that have been prescribed to you. Take any new Medicines after you have completely understood and accpet all the possible adverse reactions/side effects.   Do not drive when taking Pain medications or sleeping medications (Benzodiazepines)  Do not take more than prescribed Pain, Sleep and Anxiety Medications. It is not advisable to combine anxiety,sleep and pain medications without talking with your primary care practitioner  Special Instructions: If you have smoked or chewed Tobacco  in the last 2 yrs  please stop smoking, stop any regular Alcohol  and or any Recreational drug use.  Wear Seat belts while driving.  Do not drive if taking any narcotic, mind altering or controlled substances or recreational drugs or alcohol.

## 2020-09-30 NOTE — Progress Notes (Signed)
Received [place] consult for patient needing prayer. Found Peggy French sitting upright in hospital bed; she smiled when Chaplain introduced self. Chaplain engaged her in reflection around her stress factors and learned that her deceased sons birthday was this past weekend. She shared how he died and the loss she feels even today and its been around 16 years later. Chaplain provided supportive listening, encouragement, affirmation of faith, and prayer today. Yenifer stated that she was grateful for visit and that she feels she needs to take better care of herself as she continues to care for her husband who has Alzheimer's disease. Chaplain will remain available as needed or requested in order to provide spiritual support and to assess for spiritual need.

## 2020-09-30 NOTE — Progress Notes (Signed)
*  PRELIMINARY RESULTS* Echocardiogram 2D Echocardiogram has been performed.  Leavy Cella 09/30/2020, 8:21 AM

## 2020-10-17 ENCOUNTER — Other Ambulatory Visit (HOSPITAL_COMMUNITY): Payer: Medicare Other

## 2020-10-28 NOTE — Progress Notes (Signed)
Cardiology Office Note:    Date:  10/29/2020   ID:  Peggy French, DOB 1941-05-26, MRN [id number]  PCP:  Iona Beard, MD  Cardiologist:  Pixie Casino, MD   Referring MD: Iona Beard, MD   Chief Complaint  Patient presents with  . Hospitalization Follow-up    Chest pain    History of Present Illness:    Peggy French is a 80 y.o. 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 She has a history of chest pain concerning for angina. She has had prior heart caths in 2001, 2012, and 2017 with mild nonobstructive CAD and low risk NST in 06/2019. Small vessel disease was suspected for her ongoing chest pain and long acting nitrate was recommended. She has a history of labile BP.  She was seen in clinic in March 2022 with ongoing shortness of breath.  Echocardiogram was recommended.  Before this can be completed she presented to [facility], ER 09/30/2020 with chest pain following visitation to her son's grave.  Echocardiogram revealed EF 55 to 60%, no regional wall motion abnormality, grade 1 diastolic dysfunction, and aortic valve noted for indeterminate number of cusps.  She ruled out with negative troponins and nonischemic EKG, and was discharged without further ischemic evaluation.  She presents for hospital follow-up. She states she continues to have intermittent chest discomfort, but not worse since hospitalization. She is not walking and has been taking care of her husband with dementia.    Past Medical History:  Diagnosis Date  . Asthmatic bronchitis 2011  . Colon carcinoma metastatic to lung ([place]) 03/27/2013   a. s/p right hemicolectomy/chemo 2013 with mets to the lung s/p LLL wedge resection 2014.  . Diabetes mellitus    x over 10 yrs  . GERD (gastroesophageal reflux disease)   . Gout   . H/O hiatal hernia   . History of colon cancer   . History of shingles   . Hyperlipidemia   . Hypertension    EKG 10/12 EPIC,  chest- 1 view  6/13 EPIC    Last 2D Echo on 05/02/2012 showed EF of greater than 55%  . Microcytic anemia   . Mild CAD    a. cath by Dr. Debara Pickett in 03/2011 - 20-30% ostial bifurcation stenosis of LAD, otherwise only mild luminal irregularities in LAD/RCA, LVEF 60%, no RWMA.   c. 06/18/16 LHC after abnormal nuc showed mild non obst CAD  . Neuropathy   . Obesity     Past Surgical History:  Procedure Laterality Date  . ABDOMINAL HYSTERECTOMY    . APPENDECTOMY    . CARDIAC CATHETERIZATION  [phone]  . CARDIAC CATHETERIZATION N/A 06/18/2016   Procedure: Left Heart Cath and Coronary Angiography;  Surgeon: Nelva Bush, MD;  Location: [facility];  Service: Cardiovascular;  Laterality: N/A;  . CATARACT EXTRACTION W/PHACO Left 02/17/2015   Procedure: CATARACT EXTRACTION PHACO AND INTRAOCULAR LENS PLACEMENT (IOC);  Surgeon: Tonny Branch, MD;  Location: [facility];  Service: Ophthalmology;  Laterality: Left;  CDE:8.01   . CHOLECYSTECTOMY    . COLON SURGERY     PARTIAL COLECTOMY 30 YRS / AGAIN 2013  . COLONOSCOPY  11/26/2011   Procedure: COLONOSCOPY;  Surgeon: Rogene Houston, MD;  Location: [facility];  Service: Endoscopy;  Laterality: N/A;  2:15  . COLONOSCOPY N/A 12/08/2012   Procedure: COLONOSCOPY;  Surgeon: Rogene Houston, MD;  Location: [facility];  Service: Endoscopy;  Laterality: N/A;  815-moved to 0950 Ann to notify pt  . COLONOSCOPY N/A 03/25/2016   Procedure: COLONOSCOPY;  Surgeon: Rogene Houston, MD;  Location: [facility];  Service: Endoscopy;  Laterality: N/A;  1200  . ESOPHAGOGASTRODUODENOSCOPY  06/16/2011   Procedure: ESOPHAGOGASTRODUODENOSCOPY (EGD);  Surgeon: Rogene Houston, MD;  Location: [facility];  Service: Endoscopy;  Laterality: N/A;  2:15  . PORT-A-CATH REMOVAL N/A 06/04/2014   Procedure: REMOVAL PORT-A-CATH;  Surgeon: Pedro Earls, MD;  Location: [facility];  Service: General;  Laterality: N/A;  . PORTACATH PLACEMENT  02/22/2012   Procedure: INSERTION  PORT-A-CATH;  Surgeon: Pedro Earls, MD;  Location: [facility];  Service: General;  Laterality: N/A;  left subclavian  . VIDEO ASSISTED THORACOSCOPY (VATS)/WEDGE RESECTION Left 03/27/2013   Procedure: VIDEO ASSISTED THORACOSCOPY (VATS)/WEDGE RESECTION;  Surgeon: Grace Isaac, MD;  Location: [place];  Service: Thoracic;  Laterality: Left;  Marland Kitchen VIDEO BRONCHOSCOPY N/A 03/27/2013   Procedure: VIDEO BRONCHOSCOPY;  Surgeon: Grace Isaac, MD;  Location: [facility];  Service: Thoracic;  Laterality: N/A;    Current Medications: Current Meds  Medication Sig  . acetaminophen (TYLENOL) 500 MG tablet Take 1,000 mg by mouth every 6 (six) hours as needed for mild pain or moderate pain. For pain  . allopurinol (ZYLOPRIM) 100 MG tablet Take 100 mg by mouth daily.  Marland Kitchen aspirin EC 81 MG tablet Take 1 tablet (81 mg total) by mouth daily.  . carboxymethylcellulose (REFRESH PLUS) 0.5 % SOLN Place 2 drops into both eyes 3 (three) times daily as needed (dry eyes).   . carvedilol (COREG) 3.125 MG tablet Take 1 tablet (3.125 mg total) by mouth 2 (two) times daily with a meal.  . COLCRYS 0.6 MG tablet Take 0.6 mg by mouth daily.  . Cyanocobalamin (B-12 PO) Take 1 tablet by mouth daily.  . diphenoxylate-atropine (LOMOTIL) 2.5-0.025 MG tablet Take 1 tablet by mouth 4 (four) times daily as needed for diarrhea or loose stools.  Marland Kitchen esomeprazole (NEXIUM) 40 MG capsule Take 40 mg by mouth every evening.  . fexofenadine (ALLEGRA) 180 MG tablet Take 180 mg by mouth daily. For Allergies  . gabapentin (NEURONTIN) 100 MG capsule Take 100 mg by mouth at bedtime.   . hydrochlorothiazide (HYDRODIURIL) 12.5 MG tablet Take 1 tablet (12.5 mg total) by mouth daily.  Marland Kitchen linagliptin (TRADJENTA) 5 MG TABS tablet Take 5 mg by mouth daily after breakfast.  . meclizine (ANTIVERT) 25 MG tablet Take 25 mg by mouth daily.  . Multiple Vitamins-Minerals (WOMENS MULTIVITAMIN PO) Take 1 tablet by mouth daily.  . nitroGLYCERIN (NITROSTAT) 0.4 MG SL tablet  Place 1 tablet (0.4 mg total) under the tongue every 5 (five) minutes as needed for chest pain.  Marland Kitchen OVER THE COUNTER MEDICATION Hair, Skin and nails vitamin  . potassium chloride SA (KLOR-CON M20) 20 MEQ tablet Take 1 tablet (20 mEq total) by mouth daily.  . ramipril (ALTACE) 5 MG capsule Take 5 mg by mouth every evening.  . simvastatin (ZOCOR) 40 MG tablet Take 40 mg by mouth at bedtime.  . vitamin C (ASCORBIC ACID) 500 MG tablet Take 500 mg by mouth daily.     Allergies:   Aspirin   Social History   Socioeconomic History  . Marital status: Married    Spouse name: Not on file  . Number of children: 4  . Years of education: Not on file  . Highest education level: Not on file  Occupational History  Employer: RETIRED  Tobacco Use  . Smoking status: Never Smoker  . Smokeless tobacco: Never Used  Vaping Use  . Vaping Use: Never used  Substance and Sexual Activity  . Alcohol use: No  . Drug use: No  . Sexual activity: Not on file  Other Topics Concern  . Not on file  Social History Narrative   Married to husband, Herbie [place]   Retired Engineer, manufacturing systems   Social Determinants of Radio broadcast assistant Strain: Not on [employer] Insecurity: Not on file  Transportation Needs: Not on file  Physical Activity: Not on file  Stress: Not on file  Social Connections: Not on file     Family History: The patient's family history includes Cancer in her maternal aunt.  ROS:   Please see the history of present illness.     All other systems reviewed and are negative.  EKGs/Labs/Other Studies Reviewed:    The following studies were reviewed today:  Echo 09/30/20: 1. Left ventricular ejection fraction, by estimation, is 55 to 60%. The  left ventricle has normal function. The left ventricle has no regional  wall motion abnormalities. There is mild left ventricular hypertrophy.  Left ventricular diastolic parameters  are consistent with Grade I diastolic dysfunction (impaired  relaxation).  2. Right ventricular systolic function is normal. The right ventricular  size is normal.  3. The mitral valve is normal in structure. No evidence of mitral valve  regurgitation. No evidence of mitral stenosis.  4. The aortic valve has an indeterminant number of cusps. There is  moderate calcification of the aortic valve. There is moderate thickening  of the aortic valve. Aortic valve regurgitation is not visualized. No  aortic stenosis is present.  5. The inferior vena cava is normal in size with greater than 50%  respiratory variability, suggesting right atrial pressure of 3 mmHg.   EKG:  EKG is ordered today.  The ekg ordered today demonstrates sinus rhythm HR 82, nonspecific ST-T changes  Recent Labs: 09/29/2020: ALT 29; BUN 15; Creatinine, Ser 0.96; Hemoglobin 12.8; Platelets 181; Potassium 3.7; Sodium 140  Recent Lipid Panel    Component Value Date/Time   CHOL 162 09/30/2020 0955   CHOL 151 11/07/2017 0842   TRIG 72 09/30/2020 0955   HDL 56 09/30/2020 0955   HDL 56 11/07/2017 0842   CHOLHDL 2.9 09/30/2020 0955   VLDL 14 09/30/2020 0955   LDLCALC 92 09/30/2020 0955   LDLCALC 78 11/07/2017 0842    Physical Exam:    VS:  BP 140/90   Pulse 80   Ht 5\' 5"  (1.651 m)   Wt 212 lb (96.2 kg)   BMI 35.28 kg/m     Wt Readings from Last 3 Encounters:  10/29/20 212 lb (96.2 kg)  09/29/20 210 lb (95.3 kg)  09/23/20 210 lb (95.3 kg)     GEN: obese female NAD HEENT: Normal NECK: No JVD; No carotid bruits CARDIAC: RRR, no murmurs, rubs, gallops RESPIRATORY:  Clear to auscultation without rales, wheezing or rhonchi  ABDOMEN: Soft, non-tender, non-distended MUSCULOSKELETAL:  No edema; No deformity  SKIN: Warm and dry NEUROLOGIC:  Alert and oriented x 3 PSYCHIATRIC:  Normal affect   ASSESSMENT:    1. Essential hypertension   2. Coronary artery disease involving native coronary artery of native heart with angina pectoris ([place])   3. Mixed hyperlipidemia   4.  Diabetes mellitus type 2 in obese (HCC)   5. Class 2 obesity due to excess calories in adult,  unspecified BMI, unspecified whether serious comorbidity present    PLAN:    In order of problems listed above:  Mild CAD Chest pain - suspect microvascular disease - possible stress component to recent hospitalization for chest pain - continue ASA, 30 mg imdur, coreg  - still having some mild chest pain - has a lot on her plate with her husband's mild dementia - will increase imdur to 45 mg BID - I do not have a good handle on her baseline BP - will try this increase in imdur and she will keep a BP log 2 hrs after morning medications  - if she is unable to tolerate imdur, may try low dose amlodipine   Hypertension - hx of labile BP - imdur, coreg, ramipril, and HCTZ - taking ramipril at night - increase imdur as above - will keep BP log as above   Hyperlipidemia with LDL goal < 70 09/30/2020: Cholesterol 162; HDL 56; LDL Cholesterol 92; Triglycerides 72; VLDL 14 - LDL above goal - on 40 mg simvastatin - she would like to try restarting her walking program - she will walk 10 min TID - recheck in 3 months   DM - A1c 7.0% - continue current therapy   Obesity - will start a walking program   Follow up with Dr. Debara Pickett as scheduled.    Medication Adjustments/Labs and Tests Ordered: Current medicines are reviewed at length with the patient today.  Concerns regarding medicines are outlined above.  Orders Placed This Encounter  Procedures  . EKG 12-Lead   Meds ordered this encounter  Medications  . isosorbide mononitrate (IMDUR) 30 MG 24 hr tablet    Sig: Take 1.5 tablets (45 mg total) by mouth 2 (two) times daily.    Dispense:  180 tablet    Refill:  3    Signed, Ledora Bottcher, [place]  10/29/2020 1:43 PM    [facility]

## 2020-10-29 ENCOUNTER — Encounter: Payer: Self-pay | Admitting: Physician Assistant

## 2020-10-29 ENCOUNTER — Ambulatory Visit: Payer: Medicare Other | Admitting: Physician Assistant

## 2020-10-29 ENCOUNTER — Other Ambulatory Visit: Payer: Self-pay

## 2020-10-29 VITALS — BP 140/90 | HR 80 | Ht 65.0 in | Wt 212.0 lb

## 2020-10-29 DIAGNOSIS — I10 Essential (primary) hypertension: Secondary | ICD-10-CM | POA: Diagnosis not present

## 2020-10-29 DIAGNOSIS — E6609 Other obesity due to excess calories: Secondary | ICD-10-CM

## 2020-10-29 DIAGNOSIS — E782 Mixed hyperlipidemia: Secondary | ICD-10-CM

## 2020-10-29 DIAGNOSIS — E1169 Type 2 diabetes mellitus with other specified complication: Secondary | ICD-10-CM | POA: Diagnosis not present

## 2020-10-29 DIAGNOSIS — I25119 Atherosclerotic heart disease of native coronary artery with unspecified angina pectoris: Secondary | ICD-10-CM | POA: Diagnosis not present

## 2020-10-29 DIAGNOSIS — E669 Obesity, unspecified: Secondary | ICD-10-CM

## 2020-10-29 MED ORDER — ISOSORBIDE MONONITRATE ER 30 MG PO TB24: 45.0000 mg | ORAL_TABLET | Freq: Two times a day (BID) | ORAL | 3 refills | Status: DC

## 2020-10-29 NOTE — Patient Instructions (Signed)
Medication Instructions:  Increase Imdur 1.5 tablets (45 mg) twice daily   *If you need a refill on your cardiac medications before your next appointment, please call your pharmacy*   Follow-Up: At [facility], you and your health needs are our priority.  As part of our continuing mission to provide you with exceptional heart care, we have created designated Provider Care Teams.  These Care Teams include your primary Cardiologist (physician) and Advanced Practice Providers (APPs -  Physician Assistants and Nurse Practitioners) who all work together to provide you with the care you need, when you need it.  We recommend signing up for the patient portal called "MyChart".  Sign up information is provided on this After Visit Summary.  MyChart is used to connect with patients for Virtual Visits (Telemedicine).  Patients are able to view lab/test results, encounter notes, upcoming appointments, etc.  Non-urgent messages can be sent to your provider as well.   To learn more about what you can do with MyChart, go to [email or web address].    Your next appointment:   Keep scheduled appointment with Dr.Hilty    Other Instructions Take BP 2 hours after AM medications.  Please walk for 10 minutes 3 times daily

## 2020-11-04 ENCOUNTER — Telehealth: Payer: Self-pay | Admitting: Internal Medicine

## 2020-11-04 NOTE — Telephone Encounter (Signed)
Pt c/o Shortness Of Breath: STAT if SOB developed within the last 24 hours or pt is noticeably SOB on the phone  1. Are you currently SOB (can you hear that pt is SOB on the phone)? No when she is moving it happends.  2. How long have you been experiencing SOB? About of week   3. Are you SOB when sitting or when up moving around? Moving around   4. Are you currently experiencing any other symptoms? PT states she is sweating more when she has SOB.  PT states that on her last visit with Fabian Sharp she was told that if she has any of these symptoms to let her know.Please advise

## 2020-11-04 NOTE — Telephone Encounter (Signed)
Spoke to patient she stated Fabian Sharp PA wanted her to call and let her know if she has sob.Stated she noticed sob last Thurs 10/30/20.She gets sob walking from one room to another.Stated her B/P has been ranging 122/78,136/77,123/72,124/74,127/71.Pulse [id number].Weight stable.No swelling in lower legs.Advised Fabian Sharp PA is not in office today.I will send message to her.

## 2020-12-25 ENCOUNTER — Other Ambulatory Visit: Payer: Self-pay

## 2020-12-25 ENCOUNTER — Inpatient Hospital Stay: Payer: Medicare Other | Attending: Nurse Practitioner | Admitting: Nurse Practitioner

## 2020-12-25 ENCOUNTER — Encounter: Payer: Self-pay | Admitting: Nurse Practitioner

## 2020-12-25 ENCOUNTER — Inpatient Hospital Stay: Payer: Medicare Other

## 2020-12-25 ENCOUNTER — Other Ambulatory Visit: Payer: Medicare Other

## 2020-12-25 VITALS — BP 155/83 | HR 90 | Temp 98.2°F | Resp 18 | Ht 65.0 in | Wt 211.4 lb

## 2020-12-25 DIAGNOSIS — C7800 Secondary malignant neoplasm of unspecified lung: Secondary | ICD-10-CM | POA: Diagnosis not present

## 2020-12-25 DIAGNOSIS — Z85038 Personal history of other malignant neoplasm of large intestine: Secondary | ICD-10-CM | POA: Insufficient documentation

## 2020-12-25 DIAGNOSIS — C189 Malignant neoplasm of colon, unspecified: Secondary | ICD-10-CM

## 2020-12-25 LAB — CEA (ACCESS): CEA (CHCC): 7.46 ng/mL — ABNORMAL HIGH (ref 0.00–5.00)

## 2020-12-25 NOTE — Progress Notes (Signed)
Peggy French OFFICE PROGRESS NOTE   Diagnosis: Colon cancer  INTERVAL HISTORY:   Peggy French returns as scheduled.  No change in bowel habits.  She occasionally takes Lomotil.  No abdominal pain.  She has a good appetite.  Objective:  Vital signs in last 24 hours:  Blood pressure (!) 155/83, pulse 90, temperature 98.2 F (36.8 C), temperature source Oral, resp. rate 18, height 5\' 5"  (1.651 m), weight 211 lb 6.4 oz (95.9 kg), SpO2 98 %.    HEENT: Neck without mass. Lymphatics: No palpable cervical, supraclavicular, axillary or inguinal lymph nodes. Resp: Lungs clear bilaterally. Cardio: Regular rate and rhythm. GI: Abdomen soft and nontender.  No hepatomegaly.  No mass. Vascular: No leg edema Skin: Left chest wall scar without evidence of recurrent tumor.   Lab Results:  Lab Results  Component Value Date   WBC 5.0 09/29/2020   HGB 12.8 09/29/2020   HCT 39.9 09/29/2020   MCV 85.6 09/29/2020   PLT 181 09/29/2020   NEUTROABS 2.4 09/29/2020    Imaging:  No results found.  Medications: I have reviewed the patient's current medications.  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 She began adjuvant CAPOX chemotherapy on 02/24/2012. She completed cycle 8 on 07/27/2012. Negative surveillance colonoscopy 12/08/2012 Delayed nausea following cycle 1 CAPOX. Aloxi was added beginning with cycle 2 with improvement. Diabetes. Microcytic anemia. Hemoglobin is normal. She will discontinue iron. Remote history of a "colon tumor" removed endoscopically in 1980. Left lower lung nodule noted on a CT 02/21/2012, slightly larger than on a CT 12/03/2011. Chest CT 06/12/2012 showed the previously seen 5 mm pulmonary nodule in left lower lobe was scarcely visualized, estimated at 3 mm. No other suspicious pulmonary nodules were identified. Chest CT 11/27/2012 showed interval enlargement of the left lower lobe  pulmonary nodule with a current measurement of 7 mm x 8 mm. No new pulmonary nodules. CT 02/22/2013 with enlargement of the anterior left lower lobe nodule and no other lung nodules. PET scan 03/02/2013 showed no associated hypermetabolism with the 12 mm left lower lobe pulmonary nodule. No hypermetabolic lymph nodes in the neck, chest, abdomen or pelvis. No abnormal hypermetabolic activity within the liver, pancreas, adrenal glands or spleen. Abnormal FDG accumulation in the posterior left nasopharyngeal mucosa. Status post a wedge resection of the left lower lobe nodule on 03/27/2013 with the pathology confirming metastatic adenocarcinoma of colorectal primary.   Chest CT 07/18/2013-negative for recurrent disease.   Chest CT 09/19/2013 to evaluate for shortness of breath showed postoperative changes in the left lower lobe without evidence of recurrent or metastatic disease. CT chest 04/01/2014 with postsurgical scarring in the left lower lobe. No evidence for metastatic disease in the chest. Stable appearance of mild subpleural reticulation of the lung bases. CT chest/abdomen/pelvis 10/01/2014 with postoperative changes left lower lobe. No evidence of metastatic disease in the chest, abdomen or pelvis.  CT chest/abdomen/pelvis 11/03/2015 with no evidence of recurrent malignancy. Colonoscopy 03/25/2016-tubular adenomas removed from the transverse colon and rectum CT chest/abdomen/pelvis 06/17/2017-no evidence of recurrent malignancy. CT chest/abdomen/pelvis 06/14/2018-no evidence of recurrent disease CTs chest/abdomen/pelvis 06/27/2019-no evidence of recurrent disease Status post Port-A-Cath placement 02/22/2012. Status post Port-A-Cath repositioning 03/14/2012. Port-A-Cath removed 06/04/2014 Probable acute laryngopharyngeal dysesthesia following cycle 2 CAPOX. Symptoms resolved with warming.   Oxaliplatin neuropathy with prolonged cold sensitivity. She has persistent neuropathy symptoms involving the  feet. Improved with Neurontin History of anterior chest pain-she has been evaluated by cardiology Hand-foot syndrome  secondary to Xeloda-improved with a dose reduction of Xeloda.    Disposition: Ms. Klar remains in clinical remission from colon cancer.  We will follow-up on the [name] from today.  She continues colonoscopy surveillance with Dr. Laural Golden.  She will return for a [name] and follow-up visit in 6 months.    Ned Card ANP/GNP-BC   12/25/2020  3:59 PM

## 2020-12-30 ENCOUNTER — Telehealth: Payer: Self-pay

## 2020-12-30 LAB — CEA (IN HOUSE-CHCC): CEA (CHCC-In House): 6.06 ng/mL — ABNORMAL HIGH (ref 0.00–5.00)

## 2020-12-30 NOTE — Telephone Encounter (Signed)
Called to see if results of [name] (In house available) lab tech states lab was to be resulted at [facility] and will appear on chart when complete

## 2020-12-30 NOTE — Telephone Encounter (Signed)
-----   Message from Owens Shark, NP sent at 12/30/2020  2:38 PM EDT -----  ----- Message ----- From: Owens Shark, NP Sent: 12/29/2020   3:48 PM EDT To: [facility]  [name] in house from 12/25/2020 has not resulted. Please f/u with lab and let me know when to expect result.  Thanks

## 2021-01-02 ENCOUNTER — Telehealth: Payer: Self-pay | Admitting: Oncology

## 2021-01-02 ENCOUNTER — Other Ambulatory Visit: Payer: Self-pay | Admitting: Nurse Practitioner

## 2021-01-02 DIAGNOSIS — C7800 Secondary malignant neoplasm of unspecified lung: Secondary | ICD-10-CM

## 2021-01-02 DIAGNOSIS — C189 Malignant neoplasm of colon, unspecified: Secondary | ICD-10-CM

## 2021-01-02 NOTE — Telephone Encounter (Signed)
I called and left a detailed message for patient regarding appointments that have been added per 6/24 sch msg.  I also gave her the phone number for Central Scheduling for her CT scan that is needed by 7/6

## 2021-01-08 ENCOUNTER — Telehealth: Payer: Self-pay | Admitting: *Deleted

## 2021-01-08 NOTE — Telephone Encounter (Signed)
CT scan moved to [facility] per insurance requirement. Notified patient of scan on 01/30/21 at [facility]. Arrive at 1445 for 2 pm scan. NPO after 11 am and drink contrast at 1 pm and 2 pm. She understands and agrees.

## 2021-01-13 ENCOUNTER — Ambulatory Visit (HOSPITAL_BASED_OUTPATIENT_CLINIC_OR_DEPARTMENT_OTHER): Payer: Medicare Other

## 2021-01-14 ENCOUNTER — Other Ambulatory Visit: Payer: Self-pay

## 2021-01-14 ENCOUNTER — Ambulatory Visit: Payer: Medicare Other | Admitting: Internal Medicine

## 2021-01-14 ENCOUNTER — Inpatient Hospital Stay: Payer: Medicare Other | Attending: Nurse Practitioner

## 2021-01-14 ENCOUNTER — Encounter: Payer: Self-pay | Admitting: Internal Medicine

## 2021-01-14 VITALS — BP 120/80 | HR 92 | Ht 64.0 in | Wt 214.6 lb

## 2021-01-14 DIAGNOSIS — E119 Type 2 diabetes mellitus without complications: Secondary | ICD-10-CM | POA: Insufficient documentation

## 2021-01-14 DIAGNOSIS — C189 Malignant neoplasm of colon, unspecified: Secondary | ICD-10-CM

## 2021-01-14 DIAGNOSIS — I10 Essential (primary) hypertension: Secondary | ICD-10-CM | POA: Diagnosis not present

## 2021-01-14 DIAGNOSIS — R0600 Dyspnea, unspecified: Secondary | ICD-10-CM

## 2021-01-14 DIAGNOSIS — I25119 Atherosclerotic heart disease of native coronary artery with unspecified angina pectoris: Secondary | ICD-10-CM | POA: Diagnosis not present

## 2021-01-14 DIAGNOSIS — Z85038 Personal history of other malignant neoplasm of large intestine: Secondary | ICD-10-CM | POA: Insufficient documentation

## 2021-01-14 DIAGNOSIS — C7800 Secondary malignant neoplasm of unspecified lung: Secondary | ICD-10-CM

## 2021-01-14 DIAGNOSIS — D509 Iron deficiency anemia, unspecified: Secondary | ICD-10-CM | POA: Diagnosis not present

## 2021-01-14 LAB — BASIC METABOLIC PANEL - CANCER CENTER ONLY
Anion gap: 8 (ref 5–15)
BUN: 13 mg/dL (ref 8–23)
CO2: 30 mmol/L (ref 22–32)
Calcium: 9.3 mg/dL (ref 8.9–10.3)
Chloride: 102 mmol/L (ref 98–111)
Creatinine: 0.9 mg/dL (ref 0.44–1.00)
GFR, Estimated: >60 mL/min (ref >60)
Glucose, Bld: 182 mg/dL — ABNORMAL HIGH (ref 70–99)
Potassium: 3.6 mmol/L (ref 3.5–5.1)
Sodium: 140 mmol/L (ref 135–145)

## 2021-01-14 NOTE — Patient Instructions (Signed)
Medication Instructions:  Your physician recommends that you continue on your current medications as directed. Please refer to the Current Medication list given to you today.  *If you need a refill on your cardiac medications before your next appointment, please call your pharmacy*   Follow-Up: At [facility], you and your health needs are our priority.  As part of our continuing mission to provide you with exceptional heart care, we have created designated Provider Care Teams.  These Care Teams include your primary Cardiologist (physician) and Advanced Practice Providers (APPs -  Physician Assistants and Nurse Practitioners) who all work together to provide you with the care you need, when you need it.  We recommend signing up for the patient portal called "MyChart".  Sign up information is provided on this After Visit Summary.  MyChart is used to connect with patients for Virtual Visits (Telemedicine).  Patients are able to view lab/test results, encounter notes, upcoming appointments, etc.  Non-urgent messages can be sent to your provider as well.   To learn more about what you can do with MyChart, go to [email or web address].    Your next appointment:   6 month(s)  The format for your next appointment:   In Person  Provider:   You may see Pixie Casino, MD or one of the following Advanced Practice Providers on your designated Care Team:   [name], PA-C Fabian Sharp, [place] or  Roby Lofts, [place]   Other Instructions Please call in October to schedule for January 2023. You may be able to see Dr. Debara Pickett at our [facility]

## 2021-01-14 NOTE — Progress Notes (Signed)
01/14/2021 Peggy French   1940/11/03  [id number]  Primary Physician Iona Beard, MD Primary Cardiologist: Dr Debara Pickett  CC: No complaints   HPI:  Pleasant 80 y/o obese AA female with a history of minor CAD by cath in 2001 and 2012. She has had chest pain and was seen in the ED in Sept 2016 and Myoview, chest CTA, and echo in Dec 2016 were unremarkable then. She was seen again in the ED Sept 2017 and ruled out for an MI. The pt says she had chest pain over the weekend. She did not go to the hospital.  She saw Dr Berdine Addison yesterday and he reassured her her EKG looked OK but wanted her seen here. The pt describes mid sternal "heaviness and tightness" that started Friday PM and lasted all weekend. Friday she had associated nausea, vomiting, and "sweating". Her pain would come and go, not obviously worsened with activity. She did not take NTG. She is on daily Nexium and is s/p remote cholecystectomy. Other medical problems include HTN, HLD, NIDDM, obesity, GERD, colon cancer s/p colectomy, and lung cancer s/p wedge resection.   06/24/2016  Peggy French returns today for follow-up of her heart catheterization. I have not seen her in a number of years. She recently saw [name], PA-C, who recommended a stress test for some symptoms concerning for angina. That stress test was negative for ischemia however, there was an abnormal TID ratio. I was planning on arranging for her catheterization however she had some more chest pain symptoms and presented to [facility]. Her pain was relieved with nitroglycerin and she was referred for cardiac catheterization. This was performed on 06/18/2016 by Dr. Saunders Revel, which demonstrated up to 30% mild to moderate nonobstructive coronary disease. LVEDP was 5 mmHg, LV EF was 60-65%. It was recommended that she go on to a long-acting nitrate for possible small vessel coronary disease. She says that she's been taking this and still is required a couple extra sublingual  nitroglycerin. She's been under significant stress. Her son was killed a number of years ago and had another son who died recently. Think this is playing a big role in things.  09/28/2016  Peggy French returns today for follow-up. Overall she is doing well without any recurrent chest pain. Blood pressure today is well controlled at 126/78. She denies any chest pain or worsening shortness of breath. EKG shows sinus rhythm at 91. It seems like the carvedilol seems to be working better at rate control for her. She was also started on HCTZ which he is helpful for her blood pressure is well.  05/12/2017  Peggy French was seen today in follow-up.  She continues to be asymptomatic.  She denies chest pain or worsening shortness of breath.  Blood pressure is well-controlled today 124/82.  She is followed by Dr. Malachy Mood at the cancer center for anemia.  EKG today shows sinus rhythm at 84.  She occasionally gets some chest discomfort which is relieved by Tums. She is also had some shortness of breath with exertion however is transient and is not worsening.  11/02/2017  Peggy French returns today for follow-up.  Overall she seems to be doing well.  She has some occasional chest discomfort which is short-lived.  She is being followed up for cancer but has a low [name].  EKG shows sinus rhythm without any ischemic changes.  She denies any worsening shortness of breath.  She has been under a lot of stress recently.  Weight has been stable.  Blood pressure is at goal.  She has not had recent lipid testing.  She also saw her primary recently for what sounds like a viral gastroenteritis.  05/31/2019  Peggy French is seen today in follow-up.  Overall she is doing well but recently has had some chest pain.  She is [name] PA-C in the office about a month ago.  He had recommended based on her prior history of some mild coronary disease by cath in 2017 for her to undergo CT coronary angiogram.  Unfortunately this was never scheduled.  There might have been an  insurance issue.  When she had cath last time she did have a Myoview stress test which was normal except for 3 times daily.  Ultimately that cath showed some mild nonobstructive coronary disease.  She is now having some more chest pain symptoms which have some typical but more atypical qualities.  She is on isosorbide and other good medical therapies.  She also has cancer and of course is under treatment with [name].  She has a CT coming up to reassess that.  01/14/2021  Peggy French returns today for follow-up.  Blood pressure was initially elevated however did come down.  She denies any chest pain or worsening shortness of breath.  EKG today shows normal sinus rhythm.  She was hospitalized in March with chest pain concerning for unstable angina.  Echo at that time showed normal systolic function and mild diastolic dysfunction without new wall motion abnormalities.  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  She has been undergoing treatment with that and is scheduled for follow-up CT imaging in a few days.   Current Outpatient Medications  Medication Sig Dispense Refill   acetaminophen (TYLENOL) 500 MG tablet Take 1,000 mg by mouth every 6 (six) hours as needed for mild pain or moderate pain. For pain     allopurinol (ZYLOPRIM) 100 MG tablet Take 100 mg by mouth daily.     aspirin EC 81 MG tablet Take 1 tablet (81 mg total) by mouth daily.     carboxymethylcellulose (REFRESH PLUS) 0.5 % SOLN Place 2 drops into both eyes 3 (three) times daily as needed (dry eyes).      carvedilol (COREG) 3.125 MG tablet Take 1 tablet (3.125 mg total) by mouth 2 (two) times daily with a meal.     COLCRYS 0.6 MG tablet Take 0.6 mg by mouth daily.     Cyanocobalamin (B-12 PO) Take 1 tablet by mouth daily.     diphenoxylate-atropine (LOMOTIL) 2.5-0.025 MG tablet Take 1 tablet by mouth 4 (four) times daily as needed for diarrhea or loose stools.     esomeprazole (NEXIUM) 40 MG capsule Take 40  mg by mouth every evening.     fexofenadine (ALLEGRA) 180 MG tablet Take 180 mg by mouth daily. For Allergies     gabapentin (NEURONTIN) 100 MG capsule Take 100 mg by mouth at bedtime.      hydrochlorothiazide (HYDRODIURIL) 12.5 MG tablet Take 1 tablet (12.5 mg total) by mouth daily.     isosorbide mononitrate (IMDUR) 30 MG 24 hr tablet Take 1.5 tablets (45 mg total) by mouth 2 (two) times daily. 180 tablet 3   linagliptin (TRADJENTA) 5 MG TABS tablet Take 5 mg by mouth daily after breakfast.     meclizine (ANTIVERT) 25 MG tablet Take 25 mg by mouth daily.     Multiple Vitamins-Minerals (WOMENS MULTIVITAMIN PO) Take 1 tablet by mouth daily.  nitroGLYCERIN (NITROSTAT) 0.4 MG SL tablet Place 1 tablet (0.4 mg total) under the tongue every 5 (five) minutes as needed for chest pain. 25 tablet 3   NOVA MAX TEST test strip daily.     OVER THE COUNTER MEDICATION Hair, Skin and nails vitamin     potassium chloride SA (KLOR-CON M20) 20 MEQ tablet Take 1 tablet (20 mEq total) by mouth daily. 30 tablet 2   ramipril (ALTACE) 5 MG capsule Take 5 mg by mouth every evening.     simvastatin (ZOCOR) 40 MG tablet Take 40 mg by mouth at bedtime.     vitamin C (ASCORBIC ACID) 500 MG tablet Take 500 mg by mouth daily.     No current facility-administered medications for this visit.   Facility-Administered Medications Ordered in Other Visits  Medication Dose Route Frequency Provider Last Rate Last Admin   sodium chloride 0.9 % injection 10 mL  10 mL Intracatheter PRN Ladell Pier, MD        Allergies  Allergen Reactions   Aspirin Palpitations    Social History   Socioeconomic History   Marital status: Married    Spouse name: Not on file   Number of children: 4   Years of education: Not on file   Highest education level: Not on file  Occupational History    Employer: RETIRED  Tobacco Use   Smoking status: Never   Smokeless tobacco: Never  Vaping Use   Vaping Use: Never used  Substance and  Sexual Activity   Alcohol use: No   Drug use: No   Sexual activity: Not on file  Other Topics Concern   Not on file  Social History Narrative   Married to husband, Herbie [place]   Retired Engineer, manufacturing systems   Social Determinants of Radio broadcast assistant Strain: Not on Art therapist Insecurity: Not on file  Transportation Needs: Not on file  Physical Activity: Not on file  Stress: Not on file  Social Connections: Not on file  Intimate Partner Violence: Not on file     Review of Systems: Pertinent items noted in HPI and remainder of comprehensive ROS otherwise negative.  Exam: Blood pressure (!) 160/80, pulse 92, height 5\' 4"  (1.626 m), weight 214 lb 9.6 oz (97.3 kg), SpO2 97 %.  General appearance: alert, cooperative, no distress and morbidly obese Neck: no JVD Lungs: clear to auscultation bilaterally Heart: regular rate and rhythm Abdomen: obese, no obvious RLQ tenderness Extremities: extremities normal, atraumatic, no cyanosis or edema Pulses: 2+ and symmetric Skin: Skin color, texture, turgor normal. No rashes or lesions Neurologic: Grossly normal  EKG  Normal sinus rhythm at 92, low voltage QRS-personally reviewed  ASSESSMENT: METS colon CA to the lung Dyspnea - normal LVEF 55-60%, grade 1 DD (09/2020) Mild, nonobstructive coronary disease (cath 2017) - abnormal TID on myoview, no ischemia Essential hypertension Dyslipidemia Type 2 diabetes with neuropathy Moderate obesity   PLAN: Mrs. Susi to have intermittent chest pain and shortness of breath.  She had an echo in March which showed some grade 1 diastolic dysfunction but no wall motion abnormalities.  She had a Myoview stress test which was negative for ischemia in December 2020.  Blood pressure was little elevated today however recheck came down.  She does have follow-up imaging of her cancer in a few days.  I would not recommend any medication changes at this time.  Follow-up with me in 6 months or sooner as  necessary.  Pixie Casino, MD,  FACC, [place] Director of the [facility] Diplomate of the American Board of Clinical Lipidology Attending Cardiologist  Direct Dial: [phone]  Fax: [phone]  Website:  [email or web address]  01/14/2021 1:44 PM

## 2021-01-16 ENCOUNTER — Ambulatory Visit: Payer: Medicare Other | Admitting: Nurse Practitioner

## 2021-01-19 ENCOUNTER — Other Ambulatory Visit: Payer: Self-pay

## 2021-01-19 ENCOUNTER — Ambulatory Visit (HOSPITAL_BASED_OUTPATIENT_CLINIC_OR_DEPARTMENT_OTHER): Payer: Medicare Other

## 2021-01-19 ENCOUNTER — Ambulatory Visit (HOSPITAL_COMMUNITY)
Admission: RE | Admit: 2021-01-19 | Discharge: 2021-01-19 | Disposition: A | Discharge status: Home / Self Care | Payer: Medicare Other | Source: Ambulatory Visit | Attending: Nurse Practitioner | Admitting: Nurse Practitioner

## 2021-01-19 DIAGNOSIS — C189 Malignant neoplasm of colon, unspecified: Secondary | ICD-10-CM

## 2021-01-19 DIAGNOSIS — C7800 Secondary malignant neoplasm of unspecified lung: Secondary | ICD-10-CM | POA: Diagnosis present

## 2021-01-19 MED ORDER — IOHEXOL 300 MG/ML  SOLN
100.0000 mL | Freq: Once | INTRAMUSCULAR | Status: AC | PRN
  Administered 2021-01-19: 100 mL via INTRAVENOUS

## 2021-01-21 ENCOUNTER — Other Ambulatory Visit: Payer: Self-pay

## 2021-01-21 ENCOUNTER — Inpatient Hospital Stay: Payer: Medicare Other | Admitting: Nurse Practitioner

## 2021-01-21 ENCOUNTER — Encounter: Payer: Self-pay | Admitting: Nurse Practitioner

## 2021-01-21 VITALS — BP 132/70 | HR 88 | Temp 97.6°F | Resp 18

## 2021-01-21 DIAGNOSIS — C189 Malignant neoplasm of colon, unspecified: Secondary | ICD-10-CM

## 2021-01-21 DIAGNOSIS — Z85038 Personal history of other malignant neoplasm of large intestine: Secondary | ICD-10-CM | POA: Diagnosis not present

## 2021-01-21 DIAGNOSIS — C7800 Secondary malignant neoplasm of unspecified lung: Secondary | ICD-10-CM

## 2021-01-21 NOTE — Progress Notes (Signed)
[name] OFFICE PROGRESS NOTE   Diagnosis: Colon cancer  INTERVAL HISTORY:   [name] returns for follow-up.  On 12/25/2020 the [name] returned mildly elevated.  She was referred for CT scans.  She is seen today to review those results.  She feels well.  No change in bowel habits.  No abdominal pain.  No nausea or vomiting.  She has a good appetite.  Objective:  Vital signs in last 24 hours:  Blood pressure 132/70, pulse 88, temperature 97.6 F (36.4 C), temperature source Temporal, resp. rate 18, SpO2 99 %.    HEENT: Neck without mass. Lymphatics: No palpable cervical, supraclavicular, axillary or inguinal lymph nodes. Resp: Lungs clear bilaterally. Cardio: Regular rate and rhythm. GI: Abdomen soft and nontender.  No hepatomegaly.  No mass. Vascular: No leg edema.   Lab Results:  Lab Results  Component Value Date   WBC 5.0 09/29/2020   HGB 12.8 09/29/2020   HCT 39.9 09/29/2020   MCV 85.6 09/29/2020   PLT 181 09/29/2020   NEUTROABS 2.4 09/29/2020    Imaging:  CT CHEST ABDOMEN PELVIS W CONTRAST  Result Date: 01/21/2021 CLINICAL DATA:  Metastatic colon cancer, elevated [name]. EXAM: CT CHEST, ABDOMEN, AND PELVIS WITH CONTRAST TECHNIQUE: Multidetector CT imaging of the chest, abdomen and pelvis was performed following the standard protocol during bolus administration of intravenous contrast. CONTRAST:  169mL OMNIPAQUE IOHEXOL 300 MG/ML  SOLN COMPARISON:  06/27/2019. FINDINGS: CT CHEST FINDINGS Cardiovascular: Atherosclerotic calcification of the aorta, aortic valve and coronary arteries. Heart size normal. No pericardial effusion. Mediastinum/Nodes: Thyroid is mildly enlarged but homogeneous. No pathologically enlarged mediastinal, hilar or axillary lymph nodes. Esophagus is grossly unremarkable. Lungs/Pleura: Basilar predominant subpleural reticular densities and mild bronchiectasis/bronchiolectasis, as on 06/27/2019. Postoperative changes in the left lower  lobe. Lungs are otherwise clear. No pleural fluid. Airway is unremarkable. Musculoskeletal: Degenerative changes in the spine. No worrisome lytic or sclerotic lesions. CT ABDOMEN PELVIS FINDINGS Hepatobiliary: Liver is unremarkable. Cholecystectomy. No biliary ductal dilatation. Pancreas: Negative. Spleen: Negative. Adrenals/Urinary Tract: Adrenal glands and kidneys are unremarkable. Ureters are decompressed. Bladder is grossly unremarkable. Stomach/Bowel: Stomach and small bowel are unremarkable. Right hemicolectomy. Colon is otherwise unremarkable. Vascular/Lymphatic: Atherosclerotic calcification of the aorta. No pathologically enlarged lymph nodes. Reproductive: Hysterectomy.  No adnexal mass. Other: No free fluid.  Mesenteries and peritoneum are unremarkable. Musculoskeletal: Degenerative changes in the spine. IMPRESSION: 1. No evidence of metastatic disease. 2. Aortic atherosclerosis (ICD10-I70.0). Coronary artery calcification. Electronically Signed   By: Lorin Picket M.D.   On: 01/21/2021 08:55    Medications: I have reviewed the patient's current medications.  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 She began adjuvant CAPOX chemotherapy on 02/24/2012. She completed cycle 8 on 07/27/2012. Negative surveillance colonoscopy 12/08/2012 Delayed nausea following cycle 1 CAPOX. Aloxi was added beginning with cycle 2 with improvement. Diabetes. Microcytic anemia. Hemoglobin is normal. She will discontinue iron. Remote history of a "colon tumor" removed endoscopically in 1980. Left lower lung nodule noted on a CT 02/21/2012, slightly larger than on a CT 12/03/2011. Chest CT 06/12/2012 showed the previously seen 5 mm pulmonary nodule in left lower lobe was scarcely visualized, estimated at 3 mm. No other suspicious pulmonary nodules were identified. Chest CT 11/27/2012 showed interval enlargement of the left lower lobe pulmonary nodule with  a current measurement of 7 mm x 8 mm. No new pulmonary nodules. CT 02/22/2013 with enlargement of the anterior left lower lobe nodule and no other  lung nodules. PET scan 03/02/2013 showed no associated hypermetabolism with the 12 mm left lower lobe pulmonary nodule. No hypermetabolic lymph nodes in the neck, chest, abdomen or pelvis. No abnormal hypermetabolic activity within the liver, pancreas, adrenal glands or spleen. Abnormal FDG accumulation in the posterior left nasopharyngeal mucosa. Status post a wedge resection of the left lower lobe nodule on 03/27/2013 with the pathology confirming metastatic adenocarcinoma of colorectal primary.   Chest CT 07/18/2013-negative for recurrent disease.   Chest CT 09/19/2013 to evaluate for shortness of breath showed postoperative changes in the left lower lobe without evidence of recurrent or metastatic disease. CT chest 04/01/2014 with postsurgical scarring in the left lower lobe. No evidence for metastatic disease in the chest. Stable appearance of mild subpleural reticulation of the lung bases. CT chest/abdomen/pelvis 10/01/2014 with postoperative changes left lower lobe. No evidence of metastatic disease in the chest, abdomen or pelvis.  CT chest/abdomen/pelvis 11/03/2015 with no evidence of recurrent malignancy. Colonoscopy 03/25/2016-tubular adenomas removed from the transverse colon and rectum CT chest/abdomen/pelvis 06/17/2017-no evidence of recurrent malignancy. CT chest/abdomen/pelvis 06/14/2018-no evidence of recurrent disease CTs chest/abdomen/pelvis 06/27/2019-no evidence of recurrent disease Elevated [name] 12/25/2020 CTs 01/19/2021-no evidence of metastatic disease Status post Port-A-Cath placement 02/22/2012. Status post Port-A-Cath repositioning 03/14/2012. Port-A-Cath removed 06/04/2014 Probable acute laryngopharyngeal dysesthesia following cycle 2 CAPOX. Symptoms resolved with warming.   Oxaliplatin neuropathy with prolonged cold sensitivity. She  has persistent neuropathy symptoms involving the feet. 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  Disposition: Ms. Matthew appears stable.  [name] last month was mildly elevated.  There is no clinical or CT evidence of disease recurrence.  Plan to repeat [name] in 2 months.  She will return for follow-up in 4 months.  We are available to see her sooner if needed.  Plan reviewed with [name].    Ned Card ANP/GNP-BC   01/21/2021  12:26 PM

## 2021-03-10 ENCOUNTER — Encounter (INDEPENDENT_AMBULATORY_CARE_PROVIDER_SITE_OTHER): Payer: Self-pay | Admitting: *Deleted

## 2021-03-25 ENCOUNTER — Inpatient Hospital Stay: Payer: Medicare Other | Attending: Nurse Practitioner

## 2021-03-25 ENCOUNTER — Other Ambulatory Visit: Payer: Self-pay

## 2021-03-25 DIAGNOSIS — C7800 Secondary malignant neoplasm of unspecified lung: Secondary | ICD-10-CM

## 2021-03-25 DIAGNOSIS — Z85038 Personal history of other malignant neoplasm of large intestine: Secondary | ICD-10-CM | POA: Diagnosis not present

## 2021-03-25 DIAGNOSIS — C189 Malignant neoplasm of colon, unspecified: Secondary | ICD-10-CM

## 2021-03-25 LAB — CEA (ACCESS): CEA (CHCC): 6.42 ng/mL — ABNORMAL HIGH (ref 0.00–5.00)

## 2021-03-26 LAB — CEA (IN HOUSE-CHCC): CEA (CHCC-In House): 4.29 ng/mL (ref 0.00–5.00)

## 2021-04-07 ENCOUNTER — Telehealth: Payer: Self-pay

## 2021-04-07 NOTE — Telephone Encounter (Signed)
TC from Pt requesting lab results. Lab results reviewed by Leander Rams NP. Pt informed [name] in normal range. Pt verbalized understanding.

## 2021-04-18 ENCOUNTER — Other Ambulatory Visit: Payer: Self-pay | Admitting: Internal Medicine

## 2021-04-29 ENCOUNTER — Encounter (INDEPENDENT_AMBULATORY_CARE_PROVIDER_SITE_OTHER): Payer: Self-pay | Admitting: *Deleted

## 2021-05-25 ENCOUNTER — Inpatient Hospital Stay: Payer: Medicare Other

## 2021-05-25 ENCOUNTER — Inpatient Hospital Stay: Payer: Medicare Other | Admitting: Oncology

## 2021-06-15 ENCOUNTER — Inpatient Hospital Stay: Payer: Medicare Other | Attending: Nurse Practitioner

## 2021-06-15 ENCOUNTER — Other Ambulatory Visit: Payer: Self-pay

## 2021-06-15 ENCOUNTER — Inpatient Hospital Stay: Payer: Medicare Other | Admitting: Oncology

## 2021-06-15 VITALS — BP 167/84 | HR 96 | Temp 98.7°F | Resp 18 | Ht 64.0 in | Wt 211.0 lb

## 2021-06-15 DIAGNOSIS — C189 Malignant neoplasm of colon, unspecified: Secondary | ICD-10-CM

## 2021-06-15 DIAGNOSIS — R970 Elevated carcinoembryonic antigen [CEA]: Secondary | ICD-10-CM | POA: Diagnosis not present

## 2021-06-15 DIAGNOSIS — C7800 Secondary malignant neoplasm of unspecified lung: Secondary | ICD-10-CM

## 2021-06-15 DIAGNOSIS — Z85038 Personal history of other malignant neoplasm of large intestine: Secondary | ICD-10-CM | POA: Diagnosis not present

## 2021-06-15 LAB — CEA (ACCESS): CEA (CHCC): 6.88 ng/mL — ABNORMAL HIGH (ref 0.00–5.00)

## 2021-06-15 NOTE — Progress Notes (Signed)
[facility] OFFICE PROGRESS NOTE   Diagnosis: Colon cancer  INTERVAL HISTORY:   Ms. Haver returns as scheduled.  She feels well.  Good appetite.  She has frequent bowel movements, improved with Lomotil.  No pain or dyspnea.  Objective:  Vital signs in last 24 hours:  Blood pressure (!) 167/84, pulse 96, temperature 98.7 F (37.1 C), temperature source Oral, resp. rate 18, height 5\' 4"  (1.626 m), weight 211 lb (95.7 kg), SpO2 100 %.    Lymphatics: No cervical, supraclavicular, axillary, or inguinal nodes Resp: Lungs clear bilaterally Cardio: Regular rate and rhythm GI: No mass, no hepatosplenomegaly, nontender Vascular: No leg edema   Lab Results:  Lab Results  Component Value Date   WBC 5.0 09/29/2020   HGB 12.8 09/29/2020   HCT 39.9 09/29/2020   MCV 85.6 09/29/2020   PLT 181 09/29/2020   NEUTROABS 2.4 09/29/2020    CMP  Lab Results  Component Value Date   NA 140 01/14/2021   K 3.6 01/14/2021   CL 102 01/14/2021   CO2 30 01/14/2021   GLUCOSE 182 (H) 01/14/2021   BUN 13 01/14/2021   CREATININE 0.90 01/14/2021   CALCIUM 9.3 01/14/2021   PROT 7.4 09/29/2020   ALBUMIN 3.6 09/29/2020   AST 34 09/29/2020   ALT 29 09/29/2020   ALKPHOS 86 09/29/2020   BILITOT 0.4 09/29/2020   GFRNONAA >60 01/14/2021   GFRAA 57 (L) 06/27/2019    Lab Results  Component Value Date   [name]1 4.29 03/25/2021   [name] 6.42 (H) 03/25/2021      Medications: I have reviewed the patient's current medications.   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 She began adjuvant CAPOX chemotherapy on 02/24/2012. She completed cycle 8 on 07/27/2012. Negative surveillance colonoscopy 12/08/2012 Delayed nausea following cycle 1 CAPOX. Aloxi was added beginning with cycle 2 with improvement. Diabetes. Microcytic anemia. Hemoglobin is normal. She will discontinue iron. Remote history of a "colon tumor" removed  endoscopically in 1980. Left lower lung nodule noted on a CT 02/21/2012, slightly larger than on a CT 12/03/2011. Chest CT 06/12/2012 showed the previously seen 5 mm pulmonary nodule in left lower lobe was scarcely visualized, estimated at 3 mm. No other suspicious pulmonary nodules were identified. Chest CT 11/27/2012 showed interval enlargement of the left lower lobe pulmonary nodule with a current measurement of 7 mm x 8 mm. No new pulmonary nodules. CT 02/22/2013 with enlargement of the anterior left lower lobe nodule and no other lung nodules. PET scan 03/02/2013 showed no associated hypermetabolism with the 12 mm left lower lobe pulmonary nodule. No hypermetabolic lymph nodes in the neck, chest, abdomen or pelvis. No abnormal hypermetabolic activity within the liver, pancreas, adrenal glands or spleen. Abnormal FDG accumulation in the posterior left nasopharyngeal mucosa. Status post a wedge resection of the left lower lobe nodule on 03/27/2013 with the pathology confirming metastatic adenocarcinoma of colorectal primary.   Chest CT 07/18/2013-negative for recurrent disease.   Chest CT 09/19/2013 to evaluate for shortness of breath showed postoperative changes in the left lower lobe without evidence of recurrent or metastatic disease. CT chest 04/01/2014 with postsurgical scarring in the left lower lobe. No evidence for metastatic disease in the chest. Stable appearance of mild subpleural reticulation of the lung bases. CT chest/abdomen/pelvis 10/01/2014 with postoperative changes left lower lobe. No evidence of metastatic disease in the chest, abdomen or pelvis.  CT chest/abdomen/pelvis 11/03/2015 with no evidence of recurrent malignancy. Colonoscopy  03/25/2016-tubular adenomas removed from the transverse colon and rectum CT chest/abdomen/pelvis 06/17/2017-no evidence of recurrent malignancy. CT chest/abdomen/pelvis 06/14/2018-no evidence of recurrent disease CTs chest/abdomen/pelvis 06/27/2019-no  evidence of recurrent disease Elevated [name] 12/25/2020 CTs 01/19/2021-no evidence of metastatic disease Status post Port-A-Cath placement 02/22/2012. Status post Port-A-Cath repositioning 03/14/2012. Port-A-Cath removed 06/04/2014 Probable acute laryngopharyngeal dysesthesia following cycle 2 CAPOX. Symptoms resolved with warming.   Oxaliplatin neuropathy with prolonged cold sensitivity. She has persistent neuropathy symptoms involving the feet. 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    Disposition: Ms. Vizcarrondo is in clinical remission from colon cancer.  She has persistent mild elevation of the [name], though the [name] was normal at the [facility] lab in September.  The [name] has not changed significantly over the past 6 months.  She underwent negative restaging CTs after the [name] was mildly elevated in June.  I will contact Dr. Laural Golden to request a surveillance colonoscopy.  Ms. Klinger will return for an office visit and [name] in 4 months.  [name], MD  06/15/2021  11:49 AM

## 2021-06-24 ENCOUNTER — Other Ambulatory Visit: Payer: Medicare Other

## 2021-06-24 ENCOUNTER — Ambulatory Visit: Payer: Medicare Other | Admitting: Oncology

## 2021-07-09 ENCOUNTER — Telehealth (INDEPENDENT_AMBULATORY_CARE_PROVIDER_SITE_OTHER): Payer: Self-pay

## 2021-07-09 ENCOUNTER — Encounter (INDEPENDENT_AMBULATORY_CARE_PROVIDER_SITE_OTHER): Payer: Self-pay

## 2021-07-09 ENCOUNTER — Other Ambulatory Visit (INDEPENDENT_AMBULATORY_CARE_PROVIDER_SITE_OTHER): Payer: Self-pay

## 2021-07-09 DIAGNOSIS — C182 Malignant neoplasm of ascending colon: Secondary | ICD-10-CM

## 2021-07-09 MED ORDER — PEG 3350-KCL-NA BICARB-NACL 420 G PO SOLR: 4000.0000 mL | ORAL | 0 refills | Status: DC

## 2021-07-09 NOTE — Telephone Encounter (Signed)
LeighAnn Madisan Bice, CMA  

## 2021-07-16 ENCOUNTER — Other Ambulatory Visit: Payer: Self-pay | Admitting: Internal Medicine

## 2021-07-22 NOTE — Patient Instructions (Signed)
ARIEONA SWAGGERTY  07/22/2021     @PREFPERIOPPHARMACY @   Your procedure is scheduled on  07/29/2021.   Report to [facility] at  [place].   Call this number if you have problems the morning of surgery:  [phone]   Remember:  Follow the diet and prep instructions given to you by the office.    DO NOT take any medications for diabetes the morning of your procedure.    Take these medicines the morning of surgery with A SIP OF WATER     allopurinol, carvedilol, colcrys, allegra, imdur, antivert(if needed).     Do not wear jewelry, make-up or nail polish.  Do not wear lotions, powders, or perfumes, or deodorant.  Do not shave 48 hours prior to surgery.  Men may shave face and neck.  Do not bring valuables to the hospital.  [facility] is not responsible for any belongings or valuables.  Contacts, dentures or bridgework may not be worn into surgery.  Leave your suitcase in the car.  After surgery it may be brought to your room.  For patients admitted to the hospital, discharge time will be determined by your treatment team.  Patients discharged the day of surgery will not be allowed to drive home and must have someone with them for 24 hours.    Special instructions:   DO NOT smoke tobacco or vape for 24 hours before your procedure.  Please read over the following fact sheets that you were given. Anesthesia Post-op Instructions and Care and Recovery After Surgery      Colonoscopy, Adult, Care After This sheet gives you information about how to care for yourself after your procedure. Your health care provider may also give you more specific instructions. If you have problems or questions, contact your health care provider. What can I expect after the procedure? After the procedure, it is common to have: A small amount of blood in your stool for 24 hours after the procedure. Some gas. Mild cramping or bloating of your abdomen. Follow these instructions at  home: Eating and drinking  Drink enough fluid to keep your urine pale yellow. Follow instructions from your health care provider about eating or drinking restrictions. Resume your normal diet as instructed by your health care provider. Avoid heavy or fried foods that are hard to digest. Activity Rest as told by your health care provider. Avoid sitting for a long time without moving. Get up to take short walks every 1-2 hours. This is important to improve blood flow and breathing. Ask for help if you feel weak or unsteady. Return to your normal activities as told by your health care provider. Ask your health care provider what activities are safe for you. Managing cramping and bloating  Try walking around when you have cramps or feel bloated. Apply heat to your abdomen as told by your health care provider. Use the heat source that your health care provider recommends, such as a moist heat pack or a heating pad. Place a towel between your skin and the heat source. Leave the heat on for 20-30 minutes. Remove the heat if your skin turns bright red. This is especially important if you are unable to feel pain, heat, or cold. You may have a greater risk of getting burned. General instructions If you were given a sedative during the procedure, it can affect you for several hours. Do not drive or operate machinery until your health care provider says  that it is safe. For the first 24 hours after the procedure: Do not sign important documents. Do not drink alcohol. Do your regular daily activities at a slower pace than normal. Eat soft foods that are easy to digest. Take over-the-counter and prescription medicines only as told by your health care provider. Keep all follow-up visits as told by your health care provider. This is important. Contact a health care provider if: You have blood in your stool 2-3 days after the procedure. Get help right away if you have: More than a small spotting of blood  in your stool. Large blood clots in your stool. Swelling of your abdomen. Nausea or vomiting. A fever. Increasing pain in your abdomen that is not relieved with medicine. Summary After the procedure, it is common to have a small amount of blood in your stool. You may also have mild cramping and bloating of your abdomen. If you were given a sedative during the procedure, it can affect you for several hours. Do not drive or operate machinery until your health care provider says that it is safe. Get help right away if you have a lot of blood in your stool, nausea or vomiting, a fever, or increased pain in your abdomen. This information is not intended to replace advice given to you by your health care provider. Make sure you discuss any questions you have with your health care provider. Document Revised: 05/04/2019 Document Reviewed: 01/22/2019 Elsevier Patient Education  [place] After This sheet gives you information about how to care for yourself after your procedure. Your health care provider may also give you more specific instructions. If you have problems or questions, contact your health care provider. What can I expect after the procedure? After the procedure, it is common to have: Tiredness. Forgetfulness about what happened after the procedure. Impaired judgment for important decisions. Nausea or vomiting. Some difficulty with balance. Follow these instructions at home: For the time period you were told by your health care provider:   Rest as needed. Do not participate in activities where you could fall or become injured. Do not drive or use machinery. Do not drink alcohol. Do not take sleeping pills or medicines that cause drowsiness. Do not make important decisions or sign legal documents. Do not take care of children on your own. Eating and drinking Follow the diet that is recommended by your health care provider. Drink enough  fluid to keep your urine pale yellow. If you vomit: Drink water, juice, or soup when you can drink without vomiting. Make sure you have little or no nausea before eating solid foods. General instructions Have a responsible adult stay with you for the time you are told. It is important to have someone help care for you until you are awake and alert. Take over-the-counter and prescription medicines only as told by your health care provider. If you have sleep apnea, surgery and certain medicines can increase your risk for breathing problems. Follow instructions from your health care provider about wearing your sleep device: Anytime you are sleeping, including during daytime naps. While taking prescription pain medicines, sleeping medicines, or medicines that make you drowsy. Avoid smoking. Keep all follow-up visits as told by your health care provider. This is important. Contact a health care provider if: You keep feeling nauseous or you keep vomiting. You feel light-headed. You are still sleepy or having trouble with balance after 24 hours. You develop a rash. You have a fever. You  have redness or swelling around the IV site. Get help right away if: You have trouble breathing. You have new-onset confusion at home. Summary For several hours after your procedure, you may feel tired. You may also be forgetful and have poor judgment. Have a responsible adult stay with you for the time you are told. It is important to have someone help care for you until you are awake and alert. Rest as told. Do not drive or operate machinery. Do not drink alcohol or take sleeping pills. Get help right away if you have trouble breathing, or if you suddenly become confused. This information is not intended to replace advice given to you by your health care provider. Make sure you discuss any questions you have with your health care provider. Document Revised: 03/13/2020 Document Reviewed: 05/31/2019 Elsevier  Patient Education  2022 [employer].

## 2021-07-23 ENCOUNTER — Telehealth (INDEPENDENT_AMBULATORY_CARE_PROVIDER_SITE_OTHER): Payer: Self-pay | Admitting: Internal Medicine

## 2021-07-23 NOTE — Patient Instructions (Addendum)
Peggy French  07/23/2021     @PREFPERIOPPHARMACY @   Your procedure is scheduled on  08/03/2021.   Report to [facility] at  [phone]  A.M.   Call this number if you have problems the morning of surgery:  [phone]   Remember:  Do not eat or drink after midnight.      Take these medicines the morning of surgery with A SIP OF WATER         carvedilol, nexium, allopurinol, allegra, imdur, antivert(if needed)     Do not wear jewelry, make-up or nail polish.  Do not wear lotions, powders, or perfumes, or deodorant.  Do not shave 48 hours prior to surgery.  Men may shave face and neck.  Do not bring valuables to the hospital.  [facility] is not responsible for any belongings or valuables.  Contacts, dentures or bridgework may not be worn into surgery.  Leave your suitcase in the car.  After surgery it may be brought to your room.  For patients admitted to the hospital, discharge time will be determined by your treatment team.  Patients discharged the day of surgery will not be allowed to drive home and must have someone with them for 24 hours.    Special instructions:   DO NOT smoke tobacco or vape for 24 hours before your procedure.  Please read over the following fact sheets that you were given. Anesthesia Post-op Instructions and Care and Recovery After Surgery      Cataract Surgery, Care After The following information offers guidance on how to care for yourself after your procedure. Your health care provider may also give you more specific instructions. If you have problems or questions, contact your health care provider. What can I expect after the procedure? After the procedure, it is common to have: Itching. Discomfort. Discharge of fluid from the eye. Sensitivity to light and touch. Bruising or redness in or around the eye. Mild blurred vision. Follow these instructions at home: Eye care  Do not touch or rub your eyes. Protect your eyes as told by  your health care provider. You may be told to wear a protective eye shield, patch, or sunglasses. Do not put a contact lens into the affected eye or eyes until your health care provider approves. Keep the area around your eye clean and dry. To do this: Avoid swimming until your health care provider approves. Do not allow water to hit you directly in the face while showering. Keep soap and shampoo out of your eyes. Check your eye every day for signs of infection. Watch for: More redness, swelling, or pain that is getting worse. More fluid, blood, or pus. Vision or sensitivity that is getting worse. Activity If you were given a sedative during the procedure, it can affect you for several hours. Do not drive or operate machinery until your health care provider says that it is safe. Avoid activities that take a lot of effort, such as playing contact sports, for as long as told by your health care provider. Do not bend over. Bending increases pressure in the eye. You can walk, climb stairs, and do light household chores. You may have to avoid lifting. Ask your health care provider how much you can safely lift. Ask your health care provider when you can return to work. If you work in a dusty environment, you may be advised to wear protective eyewear for a period of time. Return to your normal activities as  told by your health care provider. Ask your health care provider what activities are safe for you. General instructions Take or apply over-the-counter and prescription medicines only as told by your health care provider. This includes eye drops. Do not use any products that contain nicotine or tobacco. These products include cigarettes, chewing tobacco, and vaping devices, such as e-cigarettes. These can delay healing after surgery. If you need help quitting, ask your health care provider. Keep all follow-up visits. This is important. Contact a health care provider if: You have pain that is not  helped with medicine. You have a fever. You have increasing redness, swelling, or pain in your eye. You have more fluid or blood, or notice pus coming from your incision. Your vision or sensitivity to light gets worse. You develop nausea or vomiting. Get help right away if: You see flashes of light or spots (floaters). You have severe eye pain. You have sudden loss of vision. Summary After your procedure, it is common to have itching, discomfort, bruising, fluid discharge, or sensitivity to light. Follow instructions from your health care provider about caring for your eye after the procedure. Do not rub your eye after the procedure. You may need to wear eye protection or sunglasses. Avoid activities that take a lot of effort, such as playing contact sports, for as long as told by your health care provider. Get help right away if you suddenly lose your vision, see flashes of light or spots, or have severe pain in the eye. This information is not intended to replace advice given to you by your health care provider. Make sure you discuss any questions you have with your health care provider. Document Revised: 02/10/2021 Document Reviewed: 01/28/2021 Elsevier Patient Education  [place].

## 2021-07-23 NOTE — Telephone Encounter (Signed)
Patient called the office regarding her procedure scheduled for 1/20- she has questions about pre-op - please advise - ph# [phone]

## 2021-07-23 NOTE — Telephone Encounter (Signed)
Patients procedure is on the 18th of Janurary

## 2021-07-24 ENCOUNTER — Encounter (HOSPITAL_COMMUNITY): Payer: Medicare Other

## 2021-07-27 ENCOUNTER — Encounter (INDEPENDENT_AMBULATORY_CARE_PROVIDER_SITE_OTHER): Payer: Self-pay | Admitting: *Deleted

## 2021-07-27 ENCOUNTER — Other Ambulatory Visit: Payer: Self-pay

## 2021-07-27 ENCOUNTER — Encounter (HOSPITAL_COMMUNITY)
Admission: RE | Admit: 2021-07-27 | Discharge: 2021-07-27 | Disposition: A | Discharge status: Home / Self Care | Payer: Medicare Other | Source: Ambulatory Visit | Attending: Internal Medicine | Admitting: Internal Medicine

## 2021-07-27 DIAGNOSIS — C182 Malignant neoplasm of ascending colon: Secondary | ICD-10-CM | POA: Insufficient documentation

## 2021-07-27 DIAGNOSIS — Z01812 Encounter for preprocedural laboratory examination: Secondary | ICD-10-CM | POA: Insufficient documentation

## 2021-07-27 LAB — BASIC METABOLIC PANEL
Anion gap: 9 (ref 5–15)
BUN: 12 mg/dL (ref 8–23)
CO2: 27 mmol/L (ref 22–32)
Calcium: 8.9 mg/dL (ref 8.9–10.3)
Chloride: 103 mmol/L (ref 98–111)
Creatinine, Ser: 0.82 mg/dL (ref 0.44–1.00)
GFR, Estimated: >60 mL/min (ref >60)
Glucose, Bld: 135 mg/dL — ABNORMAL HIGH (ref 70–99)
Potassium: 3.7 mmol/L (ref 3.5–5.1)
Sodium: 139 mmol/L (ref 135–145)

## 2021-07-27 NOTE — H&P (Signed)
Surgical History & Physical  Patient Name: Peggy French DOB: [birth year]  Surgery: Cataract extraction with intraocular lens implant phacoemulsification; Right Eye  Surgeon: Baruch Goldmann MD Surgery Date:  08-03-21 Pre-Op Date:  07-23-21  HPI: A [name] is referred by Dr Jorja Loa for cataract eval. 1. The patient complains of difficulty when driving, which began 6 months ago. Both eyes are affected. The episode is gradual. The condition's severity increased since last visit. Symptoms occur when the patient is driving, inside and outside. This is negatively affecting the patient's quality of life and the patient is unable to function adequately in life with the current level of vision. HPI was performed by Baruch Goldmann .  Medical History: Dry Eyes Cataracts HTN retinopathy OU Pinguecula OU Glaucoma Arthritis Cancer Diabetes - DM Type 2 GERD Gout High Blood Pressure  Review of Systems Negative Allergic/Immunologic Negative Cardiovascular Negative Constitutional Negative Ear, Nose, Mouth & Throat Negative Endocrine Negative Eyes Negative Gastrointestinal Negative Genitourinary Negative Hemotologic/Lymphatic Negative Integumentary Negative Musculoskeletal Negative Neurological Negative Psychiatry Negative Respiratory  Social   Never smoked   Medication Amlodipine, Ferrous gluconate, Fexofenadine, Gabapentin, Lidocaine, Nexium, Propranolol, Ramipril, Simvastatin, Tradjenta,   Sx/Procedures Phaco c IOL, Partial Hysterecomy, Appendectomy, Gallbladder Sx,   Drug Allergies   NKDA  History & Physical: Heent: Cataract, Right Eye NECK: supple without bruits LUNGS: lungs clear to auscultation CV: regular rate and rhythm Abdomen: soft and non-tender  Impression & Plan: Assessment: 1.  COMBINED FORMS AGE RELATED CATARACT; Right Eye (H25.811) 2.  Diabetes Type 2 No retinopathy (E11.9) 3.  INTRAOCULAR LENS IOL ; Left Eye (Z96.1) 4.  BLEPHARITIS; Right  Upper Lid, Left Upper Lid (H01.001, H01.004) 5.  Pinguecula; Both Eyes (H11.153) 6.  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  Cataract accounts for the patient's decreased vision. This visual impairment is not correctable with a tolerable change in glasses or contact lenses. Cataract surgery with an implantation of a new lens should significantly improve the visual and functional status of the patient. Discussed all risks, benefits, alternatives, and potential complications. Discussed the procedures and recovery. Patient desires to have surgery. A-scan ordered and performed today for intra-ocular lens calculations. The surgery will be performed in order to improve vision for driving, reading, and for eye examinations. Recommend phacoemulsification with intra-ocular lens. Recommend Dextenza for post-operative pain and inflammation. Right Eye. Dilates poorly - shugarcaine by protocol. Omidira. Set goal for -2.75 (patient currently has monovision - will maintain)  2.  Stressed importance of blood sugar and blood pressure control, and also yearly eye examinations. Discussed the need for ongoing proactive ocular exams and treatment, hopefully before visual symptoms develop.  3.  Stable Hoya iSert 250 +23.0 OS.  4.  Recommend regular lid cleaning.  5.  Observe; Artificial tears as needed for irritation.  6.  based on nerve appearance and OCT rNFL. OCT rNFL thinning OU 12/22 IOPs are at 30mmHg, so possibly at goal. Will need further workup after cataract surgery (HVF, pachy).

## 2021-07-27 NOTE — Progress Notes (Unsigned)
[place]

## 2021-07-28 ENCOUNTER — Encounter (HOSPITAL_COMMUNITY)
Admission: RE | Admit: 2021-07-28 | Discharge: 2021-07-28 | Disposition: A | Discharge status: Home / Self Care | Payer: Medicare Other | Source: Ambulatory Visit | Attending: Ophthalmology | Admitting: Ophthalmology

## 2021-07-29 ENCOUNTER — Ambulatory Visit (HOSPITAL_COMMUNITY)
Admission: RE | Admit: 2021-07-29 | Discharge: 2021-07-29 | Disposition: A | Discharge status: Home / Self Care | Payer: Medicare Other | Attending: Internal Medicine | Admitting: Internal Medicine

## 2021-07-29 ENCOUNTER — Ambulatory Visit (HOSPITAL_COMMUNITY): Payer: Medicare Other | Admitting: Anesthesiology

## 2021-07-29 ENCOUNTER — Encounter (HOSPITAL_COMMUNITY)
Admission: RE | Disposition: A | Discharge status: Home / Self Care | Payer: Self-pay | Source: Home / Self Care | Attending: Internal Medicine

## 2021-07-29 ENCOUNTER — Other Ambulatory Visit: Payer: Self-pay

## 2021-07-29 ENCOUNTER — Encounter (HOSPITAL_COMMUNITY): Payer: Self-pay | Admitting: Internal Medicine

## 2021-07-29 DIAGNOSIS — E1140 Type 2 diabetes mellitus with diabetic neuropathy, unspecified: Secondary | ICD-10-CM | POA: Insufficient documentation

## 2021-07-29 DIAGNOSIS — K5730 Diverticulosis of large intestine without perforation or abscess without bleeding: Secondary | ICD-10-CM

## 2021-07-29 DIAGNOSIS — K219 Gastro-esophageal reflux disease without esophagitis: Secondary | ICD-10-CM | POA: Insufficient documentation

## 2021-07-29 DIAGNOSIS — I2510 Atherosclerotic heart disease of native coronary artery without angina pectoris: Secondary | ICD-10-CM | POA: Insufficient documentation

## 2021-07-29 DIAGNOSIS — Z902 Acquired absence of lung [part of]: Secondary | ICD-10-CM | POA: Insufficient documentation

## 2021-07-29 DIAGNOSIS — Z1211 Encounter for screening for malignant neoplasm of colon: Secondary | ICD-10-CM | POA: Diagnosis not present

## 2021-07-29 DIAGNOSIS — I10 Essential (primary) hypertension: Secondary | ICD-10-CM | POA: Insufficient documentation

## 2021-07-29 DIAGNOSIS — Z85038 Personal history of other malignant neoplasm of large intestine: Secondary | ICD-10-CM | POA: Insufficient documentation

## 2021-07-29 DIAGNOSIS — K449 Diaphragmatic hernia without obstruction or gangrene: Secondary | ICD-10-CM | POA: Diagnosis not present

## 2021-07-29 DIAGNOSIS — C7800 Secondary malignant neoplasm of unspecified lung: Secondary | ICD-10-CM | POA: Diagnosis not present

## 2021-07-29 DIAGNOSIS — D123 Benign neoplasm of transverse colon: Secondary | ICD-10-CM | POA: Diagnosis not present

## 2021-07-29 DIAGNOSIS — K635 Polyp of colon: Secondary | ICD-10-CM | POA: Diagnosis not present

## 2021-07-29 DIAGNOSIS — Z9049 Acquired absence of other specified parts of digestive tract: Secondary | ICD-10-CM | POA: Diagnosis not present

## 2021-07-29 DIAGNOSIS — D649 Anemia, unspecified: Secondary | ICD-10-CM | POA: Insufficient documentation

## 2021-07-29 DIAGNOSIS — Z980 Intestinal bypass and anastomosis status: Secondary | ICD-10-CM | POA: Diagnosis not present

## 2021-07-29 DIAGNOSIS — Z08 Encounter for follow-up examination after completed treatment for malignant neoplasm: Secondary | ICD-10-CM | POA: Diagnosis not present

## 2021-07-29 HISTORY — PX: COLONOSCOPY WITH PROPOFOL: SHX5780

## 2021-07-29 HISTORY — PX: POLYPECTOMY: SHX5525

## 2021-07-29 LAB — HM COLONOSCOPY

## 2021-07-29 LAB — GLUCOSE, CAPILLARY: Glucose-Capillary: 142 mg/dL — ABNORMAL HIGH (ref 70–99)

## 2021-07-29 SURGERY — COLONOSCOPY WITH PROPOFOL
Anesthesia: General

## 2021-07-29 MED ORDER — LIDOCAINE HCL (CARDIAC) PF 100 MG/5ML IV SOSY
PREFILLED_SYRINGE | INTRAVENOUS | Status: DC | PRN
  Administered 2021-07-29: 50 mg via INTRAVENOUS

## 2021-07-29 MED ORDER — PROPOFOL 10 MG/ML IV BOLUS
INTRAVENOUS | Status: DC | PRN
  Administered 2021-07-29: 100 mg via INTRAVENOUS

## 2021-07-29 MED ORDER — PROPOFOL 500 MG/50ML IV EMUL
INTRAVENOUS | Status: DC | PRN
  Administered 2021-07-29: 100 ug/kg/min via INTRAVENOUS

## 2021-07-29 MED ORDER — LACTATED RINGERS IV SOLN: INTRAVENOUS | Status: DC

## 2021-07-29 NOTE — Transfer of Care (Signed)
Immediate Anesthesia Transfer of Care Note  Patient: Peggy French  Procedure(s) Performed: COLONOSCOPY WITH PROPOFOL POLYPECTOMY  Patient Location: Short Stay  Anesthesia Type:General  Level of Consciousness: awake, alert  and oriented  Airway & Oxygen Therapy: Patient Spontanous Breathing  Post-op Assessment: Report given to RN and Post -op Vital signs reviewed and stable  Post vital signs: Reviewed and stable  Last Vitals:  Vitals Value Taken Time  BP    Temp    Pulse    Resp    SpO2      Last Pain:  Vitals:   07/29/21 1000  TempSrc: Oral  PainSc: 0-No pain         Complications: No notable events documented.

## 2021-07-29 NOTE — Anesthesia Preprocedure Evaluation (Signed)
Anesthesia Evaluation  Patient identified by MRN, date of birth, ID band Patient awake    Reviewed: Allergy & Precautions, NPO status , Patient's Chart, lab work & pertinent test results  Airway Mallampati: II  TM Distance: >3 FB     Dental  (+) Edentulous Upper   Pulmonary neg pulmonary ROS,  Mets from colon CA   breath sounds clear to auscultation       Cardiovascular hypertension, Pt. on medications + angina (stable) + CAD   Rhythm:Regular Rate:Normal     Neuro/Psych negative neurological ROS     GI/Hepatic Neg liver ROS, hiatal hernia, GERD  Medicated,  Endo/Other  negative endocrine ROSdiabetes, Type 2  Renal/GU negative Renal ROS     Musculoskeletal negative musculoskeletal ROS (+)   Abdominal   Peds  Hematology negative hematology ROS (+) anemia ,   Anesthesia Other Findings   Reproductive/Obstetrics                             Anesthesia Physical  Anesthesia Plan  ASA: 3  Anesthesia Plan: General   Post-op Pain Management:    Induction:   PONV Risk Score and Plan: 1 and TIVA  Airway Management Planned: Natural Airway and Nasal Cannula  Additional Equipment:   Intra-op Plan:   Post-operative Plan:   Informed Consent: I have reviewed the patients History and Physical, chart, labs and discussed the procedure including the risks, benefits and alternatives for the proposed anesthesia with the patient or authorized representative who has indicated his/her understanding and acceptance.     Dental advisory given  Plan Discussed with: CRNA  Anesthesia Plan Comments:         Anesthesia Quick Evaluation

## 2021-07-29 NOTE — Anesthesia Procedure Notes (Signed)
Date/Time: 07/29/2021 10:20 AM Performed by: Orlie Dakin, CRNA Pre-anesthesia Checklist: Patient identified, Emergency Drugs available, Suction available and Patient being monitored Patient Re-evaluated:Patient Re-evaluated prior to induction Oxygen Delivery Method: Nasal cannula Induction Type: IV induction Placement Confirmation: positive ETCO2

## 2021-07-29 NOTE — Op Note (Signed)
[facility] Patient Name: Peggy French Procedure Date: 07/29/2021 9:46 AM MRN: [id number] Date of Birth: 1941-07-07 Attending MD: Hildred Laser , MD CSN: [id number] Age: 81 Admit Type: Outpatient Procedure:                Colonoscopy Indications:              High risk colon cancer surveillance: Personal                            history of colon cancer Providers:                Hildred Laser, MD, Lurline Del, RN, Hughie Closs,                            RN, Randa Spike, Technician Referring MD:             [name], MD and Iona Beard, MD Medicines:                Propofol per Anesthesia Complications:            No immediate complications. Estimated Blood Loss:     Estimated blood loss was minimal. Procedure:                Pre-Anesthesia Assessment:                           - Prior to the procedure, a History and Physical                            was performed, and patient medications and                            allergies were reviewed. The patient's tolerance of                            previous anesthesia was also reviewed. The risks                            and benefits of the procedure and the sedation                            options and risks were discussed with the patient.                            All questions were answered, and informed consent                            was obtained. Prior Anticoagulants: The patient has                            taken no previous anticoagulant or antiplatelet                            agents. ASA Grade Assessment: III - A patient with  mild systemic disease. After reviewing the risks                            and benefits, the patient was deemed in                            satisfactory condition to undergo the procedure.                           After obtaining informed consent, the colonoscope                            was passed under direct vision. Throughout the                             procedure, the patient's blood pressure, pulse, and                            oxygen saturations were monitored continuously. The                            [id number]-HQ190L ([id number]) scope was introduced through                            the anus and advanced to the the ileocolonic                            anastomosis. The colonoscopy was performed without                            difficulty. The patient tolerated the procedure                            well. The quality of the bowel preparation was                            excellent. The terminal ileum and the rectum were                            photographed. Scope In: 10:18:25 AM Scope Out: 10:42:44 AM Scope Withdrawal Time: 0 hours 19 minutes 59 seconds  Total Procedure Duration: 0 hours 24 minutes 19 seconds  Findings:      The perianal and digital rectal examinations were normal.      There was evidence of a prior end-to-side ileo-colonic anastomosis at       the hepatic flexure. This was patent and was characterized by healthy       appearing mucosa. The anastomosis was traversed.      The neo-terminal ileum appeared normal.      A 8 mm polyp was found in the proximal transverse colon. The polyp was       flat. Area was successfully injected with 3 mL Eleview for lesion       assessment, and this injection appeared to lift the lesion adequately.       The polyp was removed with a  hot snare. Resection and retrieval were       complete.      A few small-mouthed diverticula were found in the sigmoid colon.      The retroflexed view of the distal rectum and anal verge was normal and       showed no anal or rectal abnormalities. Impression:               - Patent end-to-side ileo-colonic anastomosis,                            characterized by healthy appearing mucosa.                           - The examined portion of the ileum was normal.                           - One 8 mm polyp in the proximal transverse  colon,                            removed with a hot snare. Resected and retrieved.                            Injected.                           - Diverticulosis in the sigmoid colon. Moderate Sedation:      Per Anesthesia Care Recommendation:           - Patient has a contact number available for                            emergencies. The signs and symptoms of potential                            delayed complications were discussed with the                            patient. Return to normal activities tomorrow.                            Written discharge instructions were provided to the                            patient.                           - Resume previous diet today.                           - Continue present medications.                           - No aspirin, ibuprofen, naproxen, or other                            non-steroidal anti-inflammatory drugs for 1 day.                           -  Await pathology results.                           - Repeat colonoscopy in 5 years for surveillance. Procedure Code(s):        --- Professional ---                           [phone], Colonoscopy, flexible; with removal of                            tumor(s), polyp(s), or other lesion(s) by snare                            technique                           45381, Colonoscopy, flexible; with directed                            submucosal injection(s), any substance Diagnosis Code(s):        --- Professional ---                           Q82.500, Personal history of other malignant                            neoplasm of large intestine                           Z98.0, Intestinal bypass and anastomosis status                           K63.5, Polyp of colon                           K57.30, Diverticulosis of large intestine without                            perforation or abscess without bleeding CPT copyright 2019 American [employer] Association. All rights reserved. The codes  documented in this report are preliminary and upon coder review may  be revised to meet current compliance requirements. Hildred Laser, MD Hildred Laser, MD 07/29/2021 10:57:29 AM This report has been signed electronically. Number of Addenda: 0

## 2021-07-29 NOTE — Discharge Instructions (Signed)
Resume aspirin on 07/30/2021 Resume other medications as before Resume usual diet No driving for 24 hours Physician will call with biopsy results.

## 2021-07-29 NOTE — H&P (Signed)
Peggy French is an 81 y.o. female.   Chief Complaint: Patient is here for colonoscopy HPI: Patient is 81 year old Afro-American female who has a history of colon carcinoma.  She underwent right hemicolectomy in June 2013.  She received adjuvant chemotherapy and had single lung met removed in September 2014.  She has remained in remission.  Last colonoscopy was in September 2017 with plans for colonoscopy in 5 years which will be in 4 months ago.  Procedure has been delayed because of COVID.  She is being followed by [name] of oncology.  Her [name] has been mildly elevated.  It peaked to 7.46 in June last year.  Last month and was 6.88.  She had abdominal pelvic CT in June 2022 and there was no evidence of metastatic disease. She denies abdominal pain or rectal bleeding.  She has anywhere from 1-3 stools per day.  She has had intermittent diarrhea since her right hemicolectomy.  On most days she has 1 formed stool daily. Family history is negative for colon cancer. Patient is undergoing surveillance colonoscopy.  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 s/p right hemicolectomy/chemo 2013 with mets to the lung s/p LLL wedge resection 2014.   Diabetes mellitus    x over 10 yrs   GERD (gastroesophageal reflux disease)    Gout    H/O hiatal hernia    History of colon cancer    History of shingles    Hyperlipidemia    Hypertension    EKG 10/12 EPIC, chest- 1 view  6/13 EPIC    Last 2D Echo on 05/02/2012 showed EF of greater than 55%   Microcytic anemia    Mild CAD    a. cath by Dr. Debara Pickett in 03/2011 - 20-30% ostial bifurcation stenosis of LAD, otherwise only mild luminal irregularities in LAD/RCA, LVEF 60%, no RWMA.   c. 06/18/16 [facility] after abnormal nuc showed mild non obst CAD   Neuropathy    Obesity     Past Surgical History:  Procedure Laterality Date   ABDOMINAL HYSTERECTOMY     APPENDECTOMY      CARDIAC CATHETERIZATION  2001,2012   CARDIAC CATHETERIZATION N/A 06/18/2016   Procedure: Left Heart Cath and Coronary Angiography;  Surgeon: Nelva Bush, MD;  Location: [facility];  Service: Cardiovascular;  Laterality: N/A;   CATARACT EXTRACTION W/PHACO Left 02/17/2015   Procedure: CATARACT EXTRACTION PHACO AND INTRAOCULAR LENS PLACEMENT (IOC);  Surgeon: Tonny Branch, MD;  Location: [facility];  Service: Ophthalmology;  Laterality: Left;  CDE:8.01    CHOLECYSTECTOMY     COLON SURGERY     PARTIAL COLECTOMY 30 YRS / AGAIN 2013   COLONOSCOPY  11/26/2011   Procedure: COLONOSCOPY;  Surgeon: Rogene Houston, MD;  Location: [facility];  Service: Endoscopy;  Laterality: N/A;  2:15   COLONOSCOPY N/A 12/08/2012   Procedure: COLONOSCOPY;  Surgeon: Rogene Houston, MD;  [facility];  Service: Endoscopy;  Laterality: N/A;  815-moved to [place] to notify pt   COLONOSCOPY N/A 03/25/2016   Procedure: COLONOSCOPY;  Surgeon: Rogene Houston, MD;  [facility];  Service: Endoscopy;  Laterality: N/A;  1200   ESOPHAGOGASTRODUODENOSCOPY  06/16/2011   Procedure: ESOPHAGOGASTRODUODENOSCOPY (EGD);  Surgeon: Rogene Houston, MD;  [facility];  Service: Endoscopy;  Laterality: N/A;  2:15   PORT-A-CATH REMOVAL N/A 06/04/2014   Procedure: REMOVAL PORT-A-CATH;  Surgeon: Pedro Earls, MD;  Location: [facility];  Service: General;  Laterality: N/A;   PORTACATH PLACEMENT  02/22/2012   Procedure: INSERTION PORT-A-CATH;  Surgeon: Pedro Earls, MD;  Location: [facility];  Service: General;  Laterality: N/A;  left subclavian   VIDEO ASSISTED THORACOSCOPY (VATS)/WEDGE RESECTION Left 03/27/2013   Procedure: VIDEO ASSISTED THORACOSCOPY (VATS)/WEDGE RESECTION;  Surgeon: Grace Isaac, MD;  Location: [facility];  Service: Thoracic;  Laterality: Left;   VIDEO BRONCHOSCOPY N/A 03/27/2013   Procedure: VIDEO BRONCHOSCOPY;  Surgeon: Grace Isaac, MD;  [facility];  Service: Thoracic;   Laterality: N/A;    Family History  Problem Relation Age of Onset   Cancer Maternal Aunt        throat,    Social History:  reports that she has never smoked. She has never used smokeless tobacco. She reports that she does not drink alcohol and does not use drugs.  Allergies:  Allergies  Allergen Reactions   Aspirin Palpitations    Medications Prior to Admission  Medication Sig Dispense Refill   acetaminophen (TYLENOL) 500 MG tablet Take 1,000 mg by mouth every 6 (six) hours as needed for mild pain or moderate pain. For pain     allopurinol (ZYLOPRIM) 100 MG tablet Take 100 mg by mouth daily.     aspirin EC 81 MG tablet Take 1 tablet (81 mg total) by mouth daily.     carvedilol (COREG) 3.125 MG tablet TAKE 1 TABLET BY MOUTH TWICE A DAY 180 tablet 3   Cyanocobalamin (B-12 PO) Place 1,000 mcg under the tongue daily.     diphenoxylate-atropine (LOMOTIL) 2.5-0.025 MG tablet Take 1 tablet by mouth 4 (four) times daily as needed for diarrhea or loose stools.     esomeprazole (NEXIUM) 40 MG capsule Take 40 mg by mouth every evening.     fexofenadine (ALLEGRA) 180 MG tablet Take 180 mg by mouth daily as needed for allergies.     gabapentin (NEURONTIN) 100 MG capsule Take 100 mg by mouth at bedtime.      hydrochlorothiazide (HYDRODIURIL) 12.5 MG tablet TAKE 1 TABLET (12.5 MG TOTAL) BY MOUTH DAILY. NEED MD APPT 90 tablet 3   linagliptin (TRADJENTA) 5 MG TABS tablet Take 5 mg by mouth daily after breakfast.     meclizine (ANTIVERT) 25 MG tablet Take 25 mg by mouth 2 (two) times daily as needed for dizziness.     Multiple Vitamins-Minerals (WOMENS MULTIVITAMIN PO) Take 1 tablet by mouth daily.     nitroGLYCERIN (NITROSTAT) 0.4 MG SL tablet Place 1 tablet (0.4 mg total) under the tongue every 5 (five) minutes as needed for chest pain. 25 tablet 3   NOVA MAX TEST test strip daily.     polyethylene glycol-electrolytes (TRILYTE) 420 g solution Take 4,000 mLs by mouth as directed. 4000 mL 0    potassium chloride SA (KLOR-CON M20) 20 MEQ tablet Take 1 tablet (20 mEq total) by mouth daily. 30 tablet 2   ramipril (ALTACE) 5 MG capsule Take 5 mg by mouth every evening.     simvastatin (ZOCOR) 40 MG tablet Take 40 mg by mouth at bedtime.     COLCRYS 0.6 MG tablet Take 0.6 mg by mouth daily.     isosorbide mononitrate (IMDUR) 30 MG 24 hr tablet TAKE 1 TABLET BY MOUTH TWICE A DAY (Patient taking differently: 45 mg.) 180 tablet 1    Results for orders placed or performed during the hospital encounter of 07/29/21 (from the  past 48 hour(s))  Glucose, capillary     Status: Abnormal   Collection Time: 07/29/21  9:57 AM  Result Value Ref Range   Glucose-Capillary 142 (H) 70 - 99 mg/dL    Comment: Glucose reference range applies only to samples taken after fasting for at least 8 hours.   No results found.  Review of Systems  Blood pressure (!) 142/99, pulse (!) 102, temperature 98 F (36.7 C), temperature source Oral, resp. rate 19, SpO2 98 %. Physical Exam HENT:     Mouth/Throat:     Mouth: Mucous membranes are moist.     Pharynx: Oropharynx is clear.  Eyes:     General: No scleral icterus.    Conjunctiva/sclera: Conjunctivae normal.  Cardiovascular:     Rate and Rhythm: Normal rate and regular rhythm.     Heart sounds: Normal heart sounds. No murmur heard. Pulmonary:     Effort: Pulmonary effort is normal.     Breath sounds: Normal breath sounds.  Abdominal:     Comments: Abdomen is full with right lower paramedian scar.  On palpation abdomen is soft.  She has mild tenderness in epigastric region.  No organomegaly or masses.  Musculoskeletal:        General: No swelling.     Cervical back: Neck supple.  Lymphadenopathy:     Cervical: No cervical adenopathy.  Skin:    General: Skin is warm and dry.  Neurological:     Mental Status: She is alert.     Assessment/Plan  History of colon carcinoma. Mildly elevated [name]. Surveillance colonoscopy.  Hildred Laser,  MD 07/29/2021, 10:10 AM

## 2021-07-29 NOTE — Anesthesia Postprocedure Evaluation (Signed)
Anesthesia Post Note  Patient: Veverly Fells  Procedure(s) Performed: COLONOSCOPY WITH PROPOFOL POLYPECTOMY  Patient location during evaluation: Endoscopy Anesthesia Type: General Level of consciousness: awake and alert Pain management: pain level controlled Vital Signs Assessment: post-procedure vital signs reviewed and stable Respiratory status: spontaneous breathing, nonlabored ventilation, respiratory function stable and patient connected to nasal cannula oxygen Cardiovascular status: blood pressure returned to baseline and stable Postop Assessment: no apparent nausea or vomiting Anesthetic complications: no   No notable events documented.   Last Vitals:  Vitals:   07/29/21 1047 07/29/21 1053  BP:  109/66  Pulse: 91   Resp: 20   Temp: 36.7 C   SpO2: 100%     Last Pain:  Vitals:   07/29/21 1047  TempSrc: Oral  PainSc: 0-No pain                 Trixie Rude

## 2021-07-30 LAB — SURGICAL PATHOLOGY

## 2021-07-31 ENCOUNTER — Encounter (HOSPITAL_COMMUNITY): Payer: Self-pay | Admitting: Internal Medicine

## 2021-08-03 ENCOUNTER — Other Ambulatory Visit: Payer: Self-pay

## 2021-08-03 ENCOUNTER — Ambulatory Visit (HOSPITAL_COMMUNITY)
Admission: RE | Admit: 2021-08-03 | Discharge: 2021-08-03 | Disposition: A | Discharge status: Home / Self Care | Payer: Medicare Other | Attending: Ophthalmology | Admitting: Ophthalmology

## 2021-08-03 ENCOUNTER — Encounter (HOSPITAL_COMMUNITY): Payer: Self-pay | Admitting: Ophthalmology

## 2021-08-03 ENCOUNTER — Ambulatory Visit (HOSPITAL_COMMUNITY): Payer: Medicare Other | Admitting: Anesthesiology

## 2021-08-03 ENCOUNTER — Encounter (HOSPITAL_COMMUNITY)
Admission: RE | Disposition: A | Discharge status: Home / Self Care | Payer: Self-pay | Source: Home / Self Care | Attending: Ophthalmology

## 2021-08-03 ENCOUNTER — Encounter (INDEPENDENT_AMBULATORY_CARE_PROVIDER_SITE_OTHER): Payer: Self-pay | Admitting: *Deleted

## 2021-08-03 DIAGNOSIS — J45909 Unspecified asthma, uncomplicated: Secondary | ICD-10-CM | POA: Diagnosis not present

## 2021-08-03 DIAGNOSIS — H25811 Combined forms of age-related cataract, right eye: Secondary | ICD-10-CM | POA: Diagnosis present

## 2021-08-03 DIAGNOSIS — I2510 Atherosclerotic heart disease of native coronary artery without angina pectoris: Secondary | ICD-10-CM | POA: Diagnosis not present

## 2021-08-03 DIAGNOSIS — K219 Gastro-esophageal reflux disease without esophagitis: Secondary | ICD-10-CM | POA: Diagnosis not present

## 2021-08-03 DIAGNOSIS — H401131 Primary open-angle glaucoma, bilateral, mild stage: Secondary | ICD-10-CM | POA: Insufficient documentation

## 2021-08-03 DIAGNOSIS — I10 Essential (primary) hypertension: Secondary | ICD-10-CM | POA: Diagnosis not present

## 2021-08-03 DIAGNOSIS — E1136 Type 2 diabetes mellitus with diabetic cataract: Secondary | ICD-10-CM | POA: Diagnosis not present

## 2021-08-03 HISTORY — PX: CATARACT EXTRACTION W/PHACO: SHX586

## 2021-08-03 LAB — GLUCOSE, CAPILLARY: Glucose-Capillary: 132 mg/dL — ABNORMAL HIGH (ref 70–99)

## 2021-08-03 SURGERY — PHACOEMULSIFICATION, CATARACT, WITH IOL INSERTION
Anesthesia: Monitor Anesthesia Care | Site: Eye | Laterality: Right

## 2021-08-03 MED ORDER — SODIUM HYALURONATE 23MG/ML IO SOSY
PREFILLED_SYRINGE | INTRAOCULAR | Status: DC | PRN
  Administered 2021-08-03: 0.6 mL via INTRAOCULAR

## 2021-08-03 MED ORDER — LIDOCAINE HCL (PF) 2 % IJ SOLN
INTRAMUSCULAR | Status: AC
  Filled 2021-08-03: qty 5

## 2021-08-03 MED ORDER — STERILE WATER FOR IRRIGATION IR SOLN
Status: DC | PRN
  Administered 2021-08-03: 250 mL

## 2021-08-03 MED ORDER — LIDOCAINE HCL 3.5 % OP GEL
1.0000 "application " | Freq: Once | OPHTHALMIC | Status: AC
  Administered 2021-08-03: 1 via OPHTHALMIC

## 2021-08-03 MED ORDER — LACTATED RINGERS IV SOLN: INTRAVENOUS | Status: DC

## 2021-08-03 MED ORDER — PHENYLEPHRINE HCL-NACL 20-0.9 MG/250ML-% IV SOLN
INTRAVENOUS | Status: AC
  Filled 2021-08-03: qty 250

## 2021-08-03 MED ORDER — NEOMYCIN-POLYMYXIN-DEXAMETH 3.5-10000-0.1 OP SUSP
OPHTHALMIC | Status: DC | PRN
  Administered 2021-08-03: 1 [drp] via OPHTHALMIC

## 2021-08-03 MED ORDER — SODIUM HYALURONATE 10 MG/ML IO SOLUTION
PREFILLED_SYRINGE | INTRAOCULAR | Status: DC | PRN
  Administered 2021-08-03: 0.85 mL via INTRAOCULAR

## 2021-08-03 MED ORDER — POVIDONE-IODINE 5 % OP SOLN
OPHTHALMIC | Status: DC | PRN
  Administered 2021-08-03: 1 via OPHTHALMIC

## 2021-08-03 MED ORDER — TETRACAINE HCL 0.5 % OP SOLN
1.0000 [drp] | OPHTHALMIC | Status: AC | PRN
  Administered 2021-08-03 (×3): 1 [drp] via OPHTHALMIC

## 2021-08-03 MED ORDER — BSS IO SOLN
INTRAOCULAR | Status: DC | PRN
  Administered 2021-08-03: 15 mL via INTRAOCULAR

## 2021-08-03 MED ORDER — LIDOCAINE HCL (PF) 1 % IJ SOLN
INTRAOCULAR | Status: DC | PRN
  Administered 2021-08-03: 1 mL via OPHTHALMIC

## 2021-08-03 MED ORDER — PROPOFOL 10 MG/ML IV BOLUS
INTRAVENOUS | Status: AC
  Filled 2021-08-03: qty 20

## 2021-08-03 MED ORDER — TROPICAMIDE 1 % OP SOLN
1.0000 [drp] | OPHTHALMIC | Status: AC | PRN
  Administered 2021-08-03 (×3): 1 [drp] via OPHTHALMIC
  Filled 2021-08-03: qty 2

## 2021-08-03 MED ORDER — PHENYLEPHRINE-KETOROLAC 1-0.3 % IO SOLN
INTRAOCULAR | Status: DC | PRN
  Administered 2021-08-03: 500 mL via OPHTHALMIC

## 2021-08-03 MED ORDER — PHENYLEPHRINE-KETOROLAC 1-0.3 % IO SOLN
INTRAOCULAR | Status: AC
  Filled 2021-08-03: qty 4

## 2021-08-03 MED ORDER — PHENYLEPHRINE HCL 2.5 % OP SOLN
1.0000 [drp] | OPHTHALMIC | Status: AC | PRN
  Administered 2021-08-03 (×3): 1 [drp] via OPHTHALMIC

## 2021-08-03 SURGICAL SUPPLY — 14 items
CATARACT SUITE SIGHTPATH (MISCELLANEOUS) ×3 IMPLANT
CLOTH BEACON ORANGE TIMEOUT ST (SAFETY) ×3 IMPLANT
EYE SHIELD UNIVERSAL CLEAR (GAUZE/BANDAGES/DRESSINGS) ×2 IMPLANT
FEE CATARACT SUITE SIGHTPATH (MISCELLANEOUS) ×1 IMPLANT
GLOVE SURG UNDER POLY LF SZ7 (GLOVE) ×4 IMPLANT
LENS IOL RAYNER 26.5 (Intraocular Lens) ×3 IMPLANT
LENS IOL RAYONE EMV 26.5 (Intraocular Lens) IMPLANT
NDL HYPO 18GX1.5 BLUNT FILL (NEEDLE) ×1 IMPLANT
NEEDLE HYPO 18GX1.5 BLUNT FILL (NEEDLE) ×3 IMPLANT
PAD ARMBOARD 7.5X6 YLW CONV (MISCELLANEOUS) ×3 IMPLANT
SYR TB 1ML LL NO SAFETY (SYRINGE) ×3 IMPLANT
TAPE SURG TRANSPORE 1 IN (GAUZE/BANDAGES/DRESSINGS) IMPLANT
TAPE SURGICAL TRANSPORE 1 IN (GAUZE/BANDAGES/DRESSINGS) ×3
WATER STERILE IRR 250ML POUR (IV SOLUTION) ×3 IMPLANT

## 2021-08-03 NOTE — Interval H&P Note (Signed)
History and Physical Interval Note:  08/03/2021 7:48 AM  Peggy French  has presented today for surgery, with the diagnosis of combined forms age related cataract, right eye.  The various methods of treatment have been discussed with the patient and family. After consideration of risks, benefits and other options for treatment, the patient has consented to  Procedure(s) with comments: CATARACT EXTRACTION PHACO AND INTRAOCULAR LENS PLACEMENT (IOC) (Right) - right as a surgical intervention.  The patient's history has been reviewed, patient examined, no change in status, stable for surgery.  I have reviewed the patient's chart and labs.  Questions were answered to the patient's satisfaction.     Baruch Goldmann

## 2021-08-03 NOTE — Discharge Instructions (Signed)
Please discharge patient when stable, will follow up today with Dr. Charlye Spare at the [facility] [place] [place] office immediately following discharge.  Leave shield in place until visit.  All paperwork with discharge instructions will be given at the office.  [facility] Address:  [address]  [place],  [address]  

## 2021-08-03 NOTE — Transfer of Care (Signed)
Immediate Anesthesia Transfer of Care Note  Patient: Peggy French  Procedure(s) Performed: CATARACT EXTRACTION PHACO AND INTRAOCULAR LENS PLACEMENT (IOC) (Right: Eye)  Patient Location: Short Stay  Anesthesia Type:MAC  Level of Consciousness: awake, alert , oriented and patient cooperative  Airway & Oxygen Therapy: Patient Spontanous Breathing  Post-op Assessment: Report given to RN, Post -op Vital signs reviewed and stable and Patient moving all extremities  Post vital signs: Reviewed and stable  Last Vitals:  Vitals Value Taken Time  BP    Temp    Pulse    Resp    SpO2      Last Pain: There were no vitals filed for this visit.       Complications: No notable events documented.

## 2021-08-03 NOTE — Op Note (Signed)
Date of procedure: 08/03/21  Pre-operative diagnosis:  Visually significant combined form age-related cataract, Right Eye (H25.811)  Post-operative diagnosis:  Visually significant combined form age-related cataract, Right Eye (H25.811)  Procedure: Removal of cataract via phacoemulsification and insertion of intra-ocular lens Rayner [id number] +26.5D into the capsular bag of the Right Eye  Attending surgeon: Gerda Diss. Arrion Broaddus, MD, MA  Anesthesia: MAC, Topical Akten  Complications: None  Estimated Blood Loss: <41m (minimal)  Specimens: None  Implants: As above  Indications:  Visually significant age-related cataract, Right Eye  Procedure:  The patient was seen and identified in the pre-operative area. The operative eye was identified and dilated.  The operative eye was marked.  Topical anesthesia was administered to the operative eye.     The patient was then to the operative suite and placed in the supine position.  A timeout was performed confirming the patient, procedure to be performed, and all other relevant information.   The patient's face was prepped and draped in the usual fashion for intra-ocular surgery.  A lid speculum was placed into the operative eye and the surgical microscope moved into place and focused.  A superotemporal paracentesis was created using a 20 gauge paracentesis blade.  Shugarcaine was injected into the anterior chamber.  Viscoelastic was injected into the anterior chamber.  A temporal clear-corneal main wound incision was created using a 2.416mmicrokeratome.  A continuous curvilinear capsulorrhexis was initiated using an irrigating cystitome and completed using capsulorrhexis forceps.  Hydrodissection and hydrodeliniation were performed.  Viscoelastic was injected into the anterior chamber.  A phacoemulsification handpiece and a chopper as a second instrument were used to remove the nucleus and epinucleus. The irrigation/aspiration handpiece was used to remove any  remaining cortical material.   The capsular bag was reinflated with viscoelastic, checked, and found to be intact.  The intraocular lens was inserted into the capsular bag.  The irrigation/aspiration handpiece was used to remove any remaining viscoelastic.  The clear corneal wound and paracentesis wounds were then hydrated and checked with Weck-Cels to be watertight.  The lid-speculum was removed.  The drape was removed.  The patient's face was cleaned with a wet and dry 4x4.   Maxitrol was instilled in the eye. A clear shield was taped over the eye. The patient was taken to the post-operative care unit in good condition, having tolerated the procedure well.  [facility]-Op Instructions: [facility] and will receive all other orders and instructions.

## 2021-08-03 NOTE — Anesthesia Preprocedure Evaluation (Signed)
Anesthesia Evaluation  Patient identified by MRN, date of birth, ID band Patient awake    Reviewed: Allergy & Precautions, H&P , NPO status , Patient's Chart, lab work & pertinent test results, reviewed documented beta blocker date and time   Airway Mallampati: II  TM Distance: >3 FB Neck ROM: full    Dental no notable dental hx.    Pulmonary asthma ,    Pulmonary exam normal breath sounds clear to auscultation       Cardiovascular Exercise Tolerance: Good hypertension, + angina + CAD   Rhythm:regular Rate:Normal     Neuro/Psych negative neurological ROS  negative psych ROS   GI/Hepatic Neg liver ROS, hiatal hernia, GERD  Medicated,  Endo/Other  negative endocrine ROSdiabetes, Type 2  Renal/GU negative Renal ROS  negative genitourinary   Musculoskeletal   Abdominal   Peds  Hematology  (+) Blood dyscrasia, anemia ,   Anesthesia Other Findings   Reproductive/Obstetrics negative OB ROS                             Anesthesia Physical Anesthesia Plan  ASA: 3  Anesthesia Plan: MAC   Post-op Pain Management:    Induction:   PONV Risk Score and Plan:   Airway Management Planned:   Additional Equipment:   Intra-op Plan:   Post-operative Plan:   Informed Consent: I have reviewed the patients History and Physical, chart, labs and discussed the procedure including the risks, benefits and alternatives for the proposed anesthesia with the patient or authorized representative who has indicated his/her understanding and acceptance.     Dental Advisory Given  Plan Discussed with: CRNA  Anesthesia Plan Comments:         Anesthesia Quick Evaluation

## 2021-08-03 NOTE — Anesthesia Postprocedure Evaluation (Signed)
Anesthesia Post Note  Patient: Peggy French  Procedure(s) Performed: CATARACT EXTRACTION PHACO AND INTRAOCULAR LENS PLACEMENT (IOC) (Right: Eye)  Patient location during evaluation: Phase II Anesthesia Type: MAC Level of consciousness: awake Pain management: pain level controlled Vital Signs Assessment: post-procedure vital signs reviewed and stable Respiratory status: spontaneous breathing and respiratory function stable Cardiovascular status: blood pressure returned to baseline and stable Postop Assessment: no headache and no apparent nausea or vomiting Anesthetic complications: no Comments: Late entry   No notable events documented.   Last Vitals:  Vitals:   08/03/21 0659 08/03/21 0820  BP: (!) 153/90 (!) 165/96  Pulse: 97 94  Resp: 18 16  Temp: 36.9 C   SpO2: 99% 98%    Last Pain:  Vitals:   08/03/21 0820  PainSc: 0-No pain                 Louann Sjogren

## 2021-08-04 ENCOUNTER — Encounter (HOSPITAL_COMMUNITY): Payer: Self-pay | Admitting: Ophthalmology

## 2021-08-31 ENCOUNTER — Ambulatory Visit (INDEPENDENT_AMBULATORY_CARE_PROVIDER_SITE_OTHER): Payer: Medicare Other | Admitting: Gastroenterology

## 2021-09-11 ENCOUNTER — Telehealth: Payer: Self-pay | Admitting: Internal Medicine

## 2021-09-11 MED ORDER — ISOSORBIDE MONONITRATE ER 30 MG PO TB24: 45.0000 mg | ORAL_TABLET | Freq: Two times a day (BID) | ORAL | 2 refills | Status: DC

## 2021-09-11 NOTE — Telephone Encounter (Signed)
Refills has been sent to the pharmacy. 

## 2021-09-11 NOTE — Telephone Encounter (Signed)
?*  STAT* If patient is at the pharmacy, call can be transferred to refill team. ? ? ?1. Which medications need to be refilled? (please list name of each medication and dose if known)  ?isosorbide mononitrate (IMDUR) 30 MG 24 hr tablet ? ?2. Which pharmacy/location (including street and city if local pharmacy) is medication to be sent to? [id number] - [place], [place] [address] ? ?3. Do they need a 30 day or 90 day supply? Needs enough medication to last her until her appt date on 10/14/21 ?

## 2021-10-12 ENCOUNTER — Inpatient Hospital Stay: Payer: Medicare Other

## 2021-10-12 ENCOUNTER — Inpatient Hospital Stay: Payer: Medicare Other | Admitting: Nurse Practitioner

## 2021-10-13 NOTE — Progress Notes (Deleted)
?Cardiology Office Note:   ? ?Date:  10/13/2021  ? ?ID:  Peggy French, DOB 17-Feb-1941, MRN [id number] ? ?PCP:  Iona Beard, MD ?[place] Cardiologist: Pixie Casino, MD  ? ?Reason for visit: Overdue 65-month follow-up ? ?History of Present Illness:   ? ?Peggy French is a 81 y.o. female with a hx of mild CAD by left heart cath 2017, HTN, HLD, NIDDM, obesity, GERD, colon cancer s/p colectomy, and lung cancer s/p wedge resection.   ? ?She last saw Dr. Debara Pickett in July 2022.  He mentioned she had metastatic colon cancer spread to the lung.  Stable intermittent chest pain shortness of breath. ? ?Today, *** ? ?Coronary artery disease ?Chronic chest pain ?-LHC 2017: Mild to moderate non-obstructive coronary artery disease of up to 30% involving the LAD, LCx, and RCA. ?-*** ? ?Dyspnea ?-Echo March 2022 with EF 55 to 60% and grade 1 diastolic dysfunction ?-*** ? ?Hypertension ?-*** ?-Goal BP is <130/80.  Recommend DASH diet (high in vegetables, fruits, low-fat dairy products, whole grains, poultry, fish, and nuts and low in sweets, sugar-sweetened beverages, and red meats), salt restriction and increase physical activity. ? ?Hyperlipidemia ?-*** ?-Discussed cholesterol lowering diets - Mediterranean diet, DASH diet, vegetarian diet, low-carbohydrate diet and avoidance of trans fats.  Discussed healthier choice substitutes.  Nuts, high-fiber foods, and fiber supplements may also improve lipids.   ? ?Disposition - Follow-up in *** ? ? ?  ?Past Medical History:  ?Diagnosis Date  ? Asthmatic bronchitis 2011  ? 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 a. s/p right hemicolectomy/chemo 2013 with mets to the lung s/p LLL wedge resection 2014.  ? Diabetes mellitus   ? x over 10 yrs  ? GERD (gastroesophageal reflux disease)   ? Gout   ? H/O hiatal hernia   ? History of colon cancer   ? History of shingles   ? Hyperlipidemia   ? Hypertension   ? EKG 10/12 EPIC, chest- 1 view  6/13 EPIC    Last 2D Echo on  05/02/2012 showed EF of greater than 55%  ? Microcytic anemia   ? Mild CAD   ? a. cath by Dr. Debara Pickett in 03/2011 - 20-30% ostial bifurcation stenosis of LAD, otherwise only mild luminal irregularities in LAD/RCA, LVEF 60%, no RWMA.   c. 06/18/16 LHC after abnormal nuc showed mild non obst CAD  ? Neuropathy   ? Obesity   ? ? ?Past Surgical History:  ?Procedure Laterality Date  ? ABDOMINAL HYSTERECTOMY    ? APPENDECTOMY    ? CARDIAC CATHETERIZATION  [phone]  ? CARDIAC CATHETERIZATION N/A 06/18/2016  ? Procedure: Left Heart Cath and Coronary Angiography;  Surgeon: Nelva Bush, MD;  Location: [facility];  Service: Cardiovascular;  Laterality: N/A;  ? CATARACT EXTRACTION W/PHACO Left 02/17/2015  ? Procedure: CATARACT EXTRACTION PHACO AND INTRAOCULAR LENS PLACEMENT (IOC);  Surgeon: Tonny Branch, MD;  [facility];  Service: Ophthalmology;  Laterality: Left;  CDE:8.01 ?  ? CATARACT EXTRACTION W/PHACO Right 08/03/2021  ? Procedure: CATARACT EXTRACTION PHACO AND INTRAOCULAR LENS PLACEMENT (IOC);  Surgeon: Baruch Goldmann, MD;  [facility]: [facility];  Service: Ophthalmology;  Laterality: Right;  CDE: 13.97  ? CHOLECYSTECTOMY    ? COLON SURGERY    ? PARTIAL COLECTOMY 30 YRS / AGAIN 2013  ? COLONOSCOPY  11/26/2011  ? Procedure: COLONOSCOPY;  Surgeon: Rogene Houston, MD;  [facility];  Service: Endoscopy;  Laterality: N/A;  2:15  ?  COLONOSCOPY N/A 12/08/2012  ? Procedure: COLONOSCOPY;  Surgeon: Rogene Houston, MD;  [facility];  Service: Endoscopy;  Laterality: N/A;  815-moved to 0950 Ann to notify pt  ? COLONOSCOPY N/A 03/25/2016  ? Procedure: COLONOSCOPY;  Surgeon: Rogene Houston, MD;  [facility];  Service: Endoscopy;  Laterality: N/A;  1200  ? COLONOSCOPY WITH PROPOFOL N/A 07/29/2021  ? Procedure: COLONOSCOPY WITH PROPOFOL;  Surgeon: Rogene Houston, MD;  [facility];  Service: Endoscopy;  Laterality: N/A;  11:10  ? ESOPHAGOGASTRODUODENOSCOPY  06/16/2011  ? Procedure:  ESOPHAGOGASTRODUODENOSCOPY (EGD);  Surgeon: Rogene Houston, MD;  [facility];  Service: Endoscopy;  Laterality: N/A;  2:15  ? POLYPECTOMY  07/29/2021  ? Procedure: POLYPECTOMY;  Surgeon: Rogene Houston, MD;  [facility];  Service: Endoscopy;;  transverse colon  ? PORT-A-CATH REMOVAL N/A 06/04/2014  ? Procedure: REMOVAL PORT-A-CATH;  Surgeon: Pedro Earls, MD;  [facility];  Service: General;  Laterality: N/A;  ? PORTACATH PLACEMENT  02/22/2012  ? Procedure: INSERTION PORT-A-CATH;  Surgeon: Pedro Earls, MD;  [facility];  Service: General;  Laterality: N/A;  left subclavian  ? VIDEO ASSISTED THORACOSCOPY (VATS)/WEDGE RESECTION Left 03/27/2013  ? Procedure: VIDEO ASSISTED THORACOSCOPY (VATS)/WEDGE RESECTION;  Surgeon: Grace Isaac, MD;  Location: [facility];  Service: Thoracic;  Laterality: Left;  ? VIDEO BRONCHOSCOPY N/A 03/27/2013  ? Procedure: VIDEO BRONCHOSCOPY;  Surgeon: Grace Isaac, MD;  Location: [facility];  Service: Thoracic;  Laterality: N/A;  ? ? ?Current Medications: ?No outpatient medications have been marked as taking for the 10/14/21 encounter (Appointment) with Warren Lacy, PA-C.  ?  ? ?Allergies:   Aspirin  ? ?Social History  ? ?Socioeconomic History  ? Marital status: Married  ?  Spouse name: Not on file  ? Number of children: 4  ? Years of education: Not on file  ? Highest education level: Not on file  ?Occupational History  ?  Employer: RETIRED  ?Tobacco Use  ? Smoking status: Never  ? Smokeless tobacco: Never  ?Vaping Use  ? Vaping Use: Never used  ?Substance and Sexual Activity  ? Alcohol use: No  ? Drug use: No  ? Sexual activity: Not on file  ?Other Topics Concern  ? Not on file  ?Social History Narrative  ? Married to husband, Herbie Baltimore  ? Retired Engineer, manufacturing systems  ? ?Social Determinants of Health  ? ?Financial Resource Strain: Not on file  ?Food Insecurity: Not on file  ?Transportation Needs: Not on file  ?Physical Activity: Not on file  ?Stress:  Not on file  ?Social Connections: Not on file  ?  ? ?Family History: ?The patient's family history includes Cancer in her maternal aunt. ? ?ROS:   ?Please see the history of present illness.    ? ?EKGs/Labs/Other Studies Reviewed:   ? ?EKG:  The ekg ordered today demonstrates *** ? ?Recent Labs: ?07/27/2021: BUN 12; Creatinine, Ser 0.82; Potassium 3.7; Sodium 139  ? ?Recent Lipid Panel ?Lab Results  ?Component Value Date/Time  ? CHOL 162 09/30/2020 09:55 AM  ? CHOL 151 11/07/2017 08:42 AM  ? TRIG 72 09/30/2020 09:55 AM  ? HDL 56 09/30/2020 09:55 AM  ? HDL 56 11/07/2017 08:42 AM  ? LDLCALC 92 09/30/2020 09:55 AM  ? LDLCALC 78 11/07/2017 08:42 AM  ? ? ?Physical Exam:   ? ?VS:  There were no vitals taken for this visit.   ?No data found. ? ?  Wt Readings from Last 3 Encounters:  ?07/27/21 210 lb (95.3 kg)  ?06/15/21 211 lb (95.7 kg)  ?01/14/21 214 lb 9.6 oz (97.3 kg)  ?  ? ?GEN: *** Well nourished, well developed in no acute distress ?HEENT: Normal ?NECK: No JVD; No carotid bruits ?CARDIAC: ***RRR, no murmurs, rubs, gallops ?RESPIRATORY:  Clear to auscultation without rales, wheezing or rhonchi  ?ABDOMEN: Soft, non-tender, non-distended ?MUSCULOSKELETAL: No edema; No deformity  ?SKIN: Warm and dry ?NEUROLOGIC:  Alert and oriented ?PSYCHIATRIC:  Normal affect  ? ?  ?ASSESSMENT AND PLAN  ? ?*** ? ? ?{Are you ordering a CV Procedure (e.g. stress test, cath, DCCV, TEE, etc)?   Press F2        :[id number]}  ? ? ?Medication Adjustments/Labs and Tests Ordered: ?Current medicines are reviewed at length with the patient today.  Concerns regarding medicines are outlined above.  ?No orders of the defined types were placed in this encounter. ? ?No orders of the defined types were placed in this encounter. ? ? ?There are no Patient Instructions on file for this visit.  ? ?Signed, ?Warren Lacy, PA-C  ?10/13/2021 9:49 AM    ?[facility] ?

## 2021-10-14 ENCOUNTER — Ambulatory Visit: Payer: Medicare Other | Admitting: Physician Assistant

## 2021-10-28 ENCOUNTER — Telehealth: Payer: Self-pay

## 2021-10-28 ENCOUNTER — Inpatient Hospital Stay: Payer: Medicare Other | Attending: Oncology

## 2021-10-28 ENCOUNTER — Encounter: Payer: Self-pay | Admitting: Nurse Practitioner

## 2021-10-28 ENCOUNTER — Inpatient Hospital Stay: Payer: Medicare Other | Admitting: Nurse Practitioner

## 2021-10-28 VITALS — BP 142/80 | HR 98 | Temp 98.1°F | Resp 18 | Ht 66.0 in | Wt 210.4 lb

## 2021-10-28 DIAGNOSIS — Z85038 Personal history of other malignant neoplasm of large intestine: Secondary | ICD-10-CM | POA: Insufficient documentation

## 2021-10-28 DIAGNOSIS — E119 Type 2 diabetes mellitus without complications: Secondary | ICD-10-CM | POA: Diagnosis not present

## 2021-10-28 DIAGNOSIS — Z9221 Personal history of antineoplastic chemotherapy: Secondary | ICD-10-CM | POA: Diagnosis not present

## 2021-10-28 DIAGNOSIS — C189 Malignant neoplasm of colon, unspecified: Secondary | ICD-10-CM

## 2021-10-28 DIAGNOSIS — C7800 Secondary malignant neoplasm of unspecified lung: Secondary | ICD-10-CM

## 2021-10-28 DIAGNOSIS — D509 Iron deficiency anemia, unspecified: Secondary | ICD-10-CM | POA: Insufficient documentation

## 2021-10-28 LAB — CEA (ACCESS): CEA (CHCC): 5.97 ng/mL — ABNORMAL HIGH (ref 0.00–5.00)

## 2021-10-28 NOTE — Telephone Encounter (Signed)
Patient gave verbal understanding ?

## 2021-10-28 NOTE — Telephone Encounter (Signed)
-----   Message from Owens Shark, NP sent at 10/28/2021  2:43 PM EDT ----- ?Please let her know the [name] is stable to slightly improved.  Follow-up as scheduled. ? ?

## 2021-10-28 NOTE — Progress Notes (Signed)
?[place] ?OFFICE PROGRESS NOTE ? ? ?Diagnosis: Colon cancer ? ?INTERVAL HISTORY:  ? ?Peggy French returns as scheduled.  No change in bowel habits.  She denies bleeding.  No abdominal pain.  She reports being diagnosed with vertigo.  She is being followed by ENT. ? ?Objective: ? ?Vital signs in last 24 hours: ? ?Blood pressure (!) 142/80, pulse 98, temperature 98.1 ?F (36.7 ?C), temperature source Oral, resp. rate 18, height 5\' 6"  (1.676 m), weight 210 lb 6.4 oz (95.4 kg), SpO2 100 %. ?  ? ?Lymphatics: No palpable cervical, supraclavicular, axillary or inguinal lymph nodes. ?Resp: Lungs clear bilaterally. ?Cardio: Regular rate and rhythm. ?GI: Abdomen soft and nontender.  No hepatosplenomegaly. ?Vascular: No leg edema. ? ? ? ?Lab Results: ? ?Lab Results  ?Component Value Date  ? WBC 5.0 09/29/2020  ? HGB 12.8 09/29/2020  ? HCT 39.9 09/29/2020  ? MCV 85.6 09/29/2020  ? PLT 181 09/29/2020  ? NEUTROABS 2.4 09/29/2020  ? ? ?Imaging: ? ?No results found. ? ?Medications: I have reviewed the patient's current medications. ? 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 She began adjuvant CAPOX chemotherapy on 02/24/2012. She completed cycle 8 on 07/27/2012. Negative surveillance colonoscopy 12/08/2012 ?Delayed nausea following cycle 1 CAPOX. Aloxi was added beginning with cycle 2 with improvement. ?Diabetes. ?Microcytic anemia. Hemoglobin is normal. She will discontinue iron. ?Remote history of a "colon tumor" removed endoscopically in 1980. ?Left lower lung nodule noted on a CT 02/21/2012, slightly larger than on a CT 12/03/2011. Chest CT 06/12/2012 showed the previously seen 5 mm pulmonary nodule in left lower lobe was scarcely visualized, estimated at 3 mm. No other suspicious pulmonary nodules were identified. Chest CT 11/27/2012 showed interval enlargement of the left lower lobe pulmonary nodule with a current measurement of 7 mm x 8 mm.  No new pulmonary nodules. CT 02/22/2013 with enlargement of the anterior left lower lobe nodule and no other lung nodules. PET scan 03/02/2013 showed no associated hypermetabolism with the 12 mm left lower lobe pulmonary nodule. No hypermetabolic lymph nodes in the neck, chest, abdomen or pelvis. No abnormal hypermetabolic activity within the liver, pancreas, adrenal glands or spleen. Abnormal FDG accumulation in the posterior left nasopharyngeal mucosa. ?Status post a wedge resection of the left lower lobe nodule on 03/27/2013 with the pathology confirming metastatic adenocarcinoma of colorectal primary.   ?Chest CT 07/18/2013-negative for recurrent disease.   ?Chest CT 09/19/2013 to evaluate for shortness of breath showed postoperative changes in the left lower lobe without evidence of recurrent or metastatic disease. ?CT chest 04/01/2014 with postsurgical scarring in the left lower lobe. No evidence for metastatic disease in the chest. Stable appearance of mild subpleural reticulation of the lung bases. ?CT chest/abdomen/pelvis 10/01/2014 with postoperative changes left lower lobe. No evidence of metastatic disease in the chest, abdomen or pelvis. ? CT chest/abdomen/pelvis 11/03/2015 with no evidence of recurrent malignancy. ?Colonoscopy 03/25/2016-tubular adenomas removed from the transverse colon and rectum ?CT chest/abdomen/pelvis 06/17/2017-no evidence of recurrent malignancy. ?CT chest/abdomen/pelvis 06/14/2018-no evidence of recurrent disease ?CTs chest/abdomen/pelvis 06/27/2019-no evidence of recurrent disease ?Elevated [name] 12/25/2020 ?CTs 01/19/2021-no evidence of metastatic disease ?Colonoscopy 07/29/2021-8 mm polyp in the proximal transverse colon (tubular adenoma), diverticulosis in the sigmoid colon ?Status post Port-A-Cath placement 02/22/2012. Status post Port-A-Cath repositioning 03/14/2012. Port-A-Cath removed 06/04/2014 ?Probable acute laryngopharyngeal dysesthesia following cycle 2 CAPOX. Symptoms  resolved with warming.   ?Oxaliplatin neuropathy with prolonged cold sensitivity. She has persistent neuropathy symptoms involving the  feet. Improved with Neurontin ?History of anterior chest pain-she has been evaluated by cardiology ?Hand-foot syndrome secondary to Xeloda-improved with a dose reduction of Xeloda. ?  ?  ? ?Disposition: Peggy French remains in clinical remission from colon cancer.  We will follow-up on the [name] from today.  If there is a steady increase in the [name] the plan is to refer for a PET scan or check for circulating tumor DNA.  Peggy French understands and agrees.  We scheduled the next visit in 3 months but will adjust accordingly pending today's [name] value. ? ?Plan reviewed with [name]. ? ?Ned Card ANP/GNP-BC  ? ?10/28/2021  ?1:56 PM ? ? ? ? ? ? ? ?

## 2021-11-10 NOTE — Progress Notes (Signed)
?Cardiology Office Note:   ? ?Date:  11/11/2021  ? ?ID:  Peggy French, DOB 12/11/1940, MRN [id number] ? ?PCP:  Iona Beard, MD ?[place] Cardiologist: Pixie Casino, MD  ? ?Reason for visit: Overdue 6 mo f/u ? ?History of Present Illness:   ? ?Peggy French is a 81 y.o. female with a hx of mild CAD by [facility] 2017, HTN, Dyslipidemia, diabetes, obesity, GERD, colon cancer s/p colectomy, lung cancer s/p wedge resection. ? ?She last saw Dr. Debara Pickett in July 2022 was noted to have intermittent chest pain shortness of breath.  Patient was last seen by oncology in 10/2021 who mentioned patient is in clinical remission from colon cancer. ? ?Today, the patient states that her chest pain and arm pain with heavy exertion improved with taking Imdur.  She rarely has angina now.  She does have shortness of breath with going > 6 steps or walking hills.  This has been stable.  Her biggest complaint complaint is vertigo.  She states she is seeing a specialist next month for positional maneuvers.  She states she had recent orthostatics taken and she did have a blood pressure drop with position changes.  She denies syncope. ? ?Otherwise from a cardiovascular standpoint, she denies PND, orthopnea, lower extremity edema & palpitations.  She requests refills on her carvedilol and Imdur.  Her other medications are filled by her PCP.  Her blood pressure is elevated today.  She states her blood pressure is up-and-down.  She states significant stress with her husband has Alzheimer's and her son is sick and is on dialysis. ? ? ?  ?Past Medical History:  ?Diagnosis Date  ? Asthmatic bronchitis 2011  ? 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 a. s/p right hemicolectomy/chemo 2013 with mets to the lung s/p LLL wedge resection 2014.  ? Diabetes mellitus   ? x over 10 yrs  ? GERD (gastroesophageal reflux disease)   ? Gout   ? H/O hiatal hernia   ? History of colon cancer   ? History of shingles   ? Hyperlipidemia   ?  Hypertension   ? EKG 10/12 EPIC, chest- 1 view  6/13 EPIC    Last 2D Echo on 05/02/2012 showed EF of greater than 55%  ? Microcytic anemia   ? Mild CAD   ? a. cath by Dr. Debara Pickett in 03/2011 - 20-30% ostial bifurcation stenosis of LAD, otherwise only mild luminal irregularities in LAD/RCA, LVEF 60%, no RWMA.   c. 06/18/16 LHC after abnormal nuc showed mild non obst CAD  ? Neuropathy   ? Obesity   ? ? ?Past Surgical History:  ?Procedure Laterality Date  ? ABDOMINAL HYSTERECTOMY    ? APPENDECTOMY    ? CARDIAC CATHETERIZATION  [phone]  ? CARDIAC CATHETERIZATION N/A 06/18/2016  ? Procedure: Left Heart Cath and Coronary Angiography;  Surgeon: Nelva Bush, MD;  Location: [facility];  Service: Cardiovascular;  Laterality: N/A;  ? CATARACT EXTRACTION W/PHACO Left 02/17/2015  ? Procedure: CATARACT EXTRACTION PHACO AND INTRAOCULAR LENS PLACEMENT (IOC);  Surgeon: Tonny Branch, MD;  [facility];  Service: Ophthalmology;  Laterality: Left;  CDE:8.01 ?  ? CATARACT EXTRACTION W/PHACO Right 08/03/2021  ? Procedure: CATARACT EXTRACTION PHACO AND INTRAOCULAR LENS PLACEMENT (IOC);  Surgeon: Baruch Goldmann, MD;  [facility];  Service: Ophthalmology;  Laterality: Right;  CDE: 13.97  ? CHOLECYSTECTOMY    ? COLON SURGERY    ? PARTIAL COLECTOMY 30 YRS / AGAIN 2013  ?  COLONOSCOPY  11/26/2011  ? Procedure: COLONOSCOPY;  Surgeon: Rogene Houston, MD;  [facility];  Service: Endoscopy;  Laterality: N/A;  2:15  ? COLONOSCOPY N/A 12/08/2012  ? Procedure: COLONOSCOPY;  Surgeon: Rogene Houston, MD;  [facility];  Service: Endoscopy;  Laterality: N/A;  815-moved to 0950 Ann to notify pt  ? COLONOSCOPY N/A 03/25/2016  ? Procedure: COLONOSCOPY;  Surgeon: Rogene Houston, MD;  [facility];  Service: Endoscopy;  Laterality: N/A;  1200  ? COLONOSCOPY WITH PROPOFOL N/A 07/29/2021  ? Procedure: COLONOSCOPY WITH PROPOFOL;  Surgeon: Rogene Houston, MD;  [facility];  Service: Endoscopy;   Laterality: N/A;  11:10  ? ESOPHAGOGASTRODUODENOSCOPY  06/16/2011  ? Procedure: ESOPHAGOGASTRODUODENOSCOPY (EGD);  Surgeon: Rogene Houston, MD;  [facility];  Service: Endoscopy;  Laterality: N/A;  2:15  ? POLYPECTOMY  07/29/2021  ? Procedure: POLYPECTOMY;  Surgeon: Rogene Houston, MD;  [facility];  Service: Endoscopy;;  transverse colon  ? PORT-A-CATH REMOVAL N/A 06/04/2014  ? Procedure: REMOVAL PORT-A-CATH;  Surgeon: Pedro Earls, MD;  [facility];  Service: General;  Laterality: N/A;  ? PORTACATH PLACEMENT  02/22/2012  ? Procedure: INSERTION PORT-A-CATH;  Surgeon: Pedro Earls, MD;  [facility];  Service: General;  Laterality: N/A;  left subclavian  ? VIDEO ASSISTED THORACOSCOPY (VATS)/WEDGE RESECTION Left 03/27/2013  ? Procedure: VIDEO ASSISTED THORACOSCOPY (VATS)/WEDGE RESECTION;  Surgeon: Grace Isaac, MD;  Location: [place];  Service: Thoracic;  Laterality: Left;  ? VIDEO BRONCHOSCOPY N/A 03/27/2013  ? Procedure: VIDEO BRONCHOSCOPY;  Surgeon: Grace Isaac, MD;  Location: [place];  Service: Thoracic;  Laterality: N/A;  ? ? ?Current Medications: ?Current Meds  ?Medication Sig  ? acetaminophen (TYLENOL) 500 MG tablet Take 1,000 mg by mouth every 6 (six) hours as needed for mild pain or moderate pain. For pain  ? allopurinol (ZYLOPRIM) 100 MG tablet Take 100 mg by mouth daily.  ? aspirin EC 81 MG tablet Take 1 tablet (81 mg total) by mouth daily.  ? Blood Glucose Monitoring Suppl (ONETOUCH VERIO REFLECT) w/Device KIT USE TO CHECK BLOOD GLUCOSE 3 TIMES DAILY BEFORE MEALS  ? COLCRYS 0.6 MG tablet Take 0.6 mg by mouth daily.  ? Cyanocobalamin (B-12 PO) Place 1,000 mcg under the tongue daily.  ? diphenoxylate-atropine (LOMOTIL) 2.5-0.025 MG tablet Take 1 tablet by mouth 4 (four) times daily as needed for diarrhea or loose stools.  ? esomeprazole (NEXIUM) 40 MG capsule Take 40 mg by mouth every evening.  ? fexofenadine (ALLEGRA) 180 MG tablet Take 180 mg by mouth daily as  needed for allergies.  ? gabapentin (NEURONTIN) 100 MG capsule Take 100 mg by mouth at bedtime.   ? hydrochlorothiazide (HYDRODIURIL) 12.5 MG tablet TAKE 1 TABLET (12.5 MG TOTAL) BY MOUTH DAILY. NEED MD APPT  ? linagliptin (TRADJENTA) 5 MG TABS tablet Take 5 mg by mouth daily after breakfast.  ? meclizine (ANTIVERT) 25 MG tablet Take 25 mg by mouth 2 (two) times daily as needed for dizziness.  ? Multiple Vitamins-Minerals (WOMENS MULTIVITAMIN PO) Take 1 tablet by mouth daily.  ? potassium chloride SA (KLOR-CON M20) 20 MEQ tablet Take 1 tablet (20 mEq total) by mouth daily.  ? ramipril (ALTACE) 5 MG capsule Take 5 mg by mouth every evening.  ? simvastatin (ZOCOR) 40 MG tablet Take 40 mg by mouth at bedtime.  ? [DISCONTINUED] carvedilol (COREG) 3.125 MG tablet TAKE 1 TABLET BY  MOUTH TWICE A DAY  ? [DISCONTINUED] isosorbide mononitrate (IMDUR) 30 MG 24 hr tablet Take 1.5 tablets (45 mg total) by mouth in the morning and at bedtime. KEEP OV.  ?  ? ?Allergies:   Aspirin  ? ?Social History  ? ?Socioeconomic History  ? Marital status: Married  ?  Spouse name: Not on file  ? Number of children: 4  ? Years of education: Not on file  ? Highest education level: Not on file  ?Occupational History  ?  Employer: RETIRED  ?Tobacco Use  ? Smoking status: Never  ? Smokeless tobacco: Never  ?Vaping Use  ? Vaping Use: Never used  ?Substance and Sexual Activity  ? Alcohol use: No  ? Drug use: No  ? Sexual activity: Not on file  ?Other Topics Concern  ? Not on file  ?Social History Narrative  ? Married to husband, Herbie [place]  ? Retired Engineer, manufacturing systems  ? ?Social Determinants of Health  ? ?Financial Resource Strain: Not on file  ?Food Insecurity: Not on file  ?Transportation Needs: Not on file  ?Physical Activity: Not on file  ?Stress: Not on file  ?Social Connections: Not on file  ?  ? ?Family History: ?The patient's family history includes Cancer in her maternal aunt. ? ?ROS:   ?Please see the history of present illness.     ? ?EKGs/Labs/Other Studies Reviewed:   ? ?EKG:  The ekg ordered today demonstrates normal sinus rhythm, heart rate 81. ? ?Recent Labs: ?07/27/2021: BUN 12; Creatinine, Ser 0.82; Potassium 3.7; Sodium 139  ? ?Recent Lipid Panel ?L

## 2021-11-11 ENCOUNTER — Ambulatory Visit: Payer: Medicare Other | Admitting: Physician Assistant

## 2021-11-11 ENCOUNTER — Encounter: Payer: Self-pay | Admitting: Physician Assistant

## 2021-11-11 VITALS — BP 140/88 | HR 81 | Ht 62.0 in | Wt 209.0 lb

## 2021-11-11 DIAGNOSIS — E782 Mixed hyperlipidemia: Secondary | ICD-10-CM

## 2021-11-11 DIAGNOSIS — I10 Essential (primary) hypertension: Secondary | ICD-10-CM | POA: Diagnosis not present

## 2021-11-11 DIAGNOSIS — I25119 Atherosclerotic heart disease of native coronary artery with unspecified angina pectoris: Secondary | ICD-10-CM | POA: Diagnosis not present

## 2021-11-11 MED ORDER — CARVEDILOL 3.125 MG PO TABS: 3.1250 mg | ORAL_TABLET | Freq: Two times a day (BID) | ORAL | 3 refills | Status: DC

## 2021-11-11 MED ORDER — ISOSORBIDE MONONITRATE ER 30 MG PO TB24: 45.0000 mg | ORAL_TABLET | Freq: Two times a day (BID) | ORAL | 2 refills | Status: DC

## 2021-11-11 NOTE — Addendum Note (Signed)
Addended by: Merri Ray A on: 11/11/2021 12:14 PM ? ? Modules accepted: Orders ? ?

## 2021-11-11 NOTE — Patient Instructions (Signed)
Medication Instructions:  ?No Changes ?*If you need a refill on your cardiac medications before your next appointment, please call your pharmacy* ? ? ?Lab Work: ?No Labs ?If you have labs (blood work) drawn today and your tests are completely normal, you will receive your results only by: ?MyChart Message (if you have MyChart) OR ?A paper copy in the mail ?If you have any lab test that is abnormal or we need to change your treatment, we will call you to review the results. ? ? ?Testing/Procedures: ?No Testing ? ? ?Follow-Up: ?At [facility], you and your health needs are our priority.  As part of our continuing mission to provide you with exceptional heart care, we have created designated Provider Care Teams.  These Care Teams include your primary Cardiologist (physician) and Advanced Practice Providers (APPs -  Physician Assistants and Nurse Practitioners) who all work together to provide you with the care you need, when you need it. ? ?We recommend signing up for the patient portal called "MyChart".  Sign up information is provided on this After Visit Summary.  MyChart is used to connect with patients for Virtual Visits (Telemedicine).  Patients are able to view lab/test results, encounter notes, upcoming appointments, etc.  Non-urgent messages can be sent to your provider as well.   ?To learn more about what you can do with MyChart, go to [email or web address].   ? ?Your next appointment:   ?1 year(s) ? ?The format for your next appointment:   ?In Person ? ?Provider:   ?Pixie Casino, MD   ? ? ?Other Instructions ?Recommend wearing Compression Stockings. Staying hydrated. When sitting or Laying down get up slowly. ? ?Important Information About Sugar ? ? ? ? ?  ?

## 2022-01-27 ENCOUNTER — Inpatient Hospital Stay: Payer: Medicare Other | Admitting: Oncology

## 2022-01-27 ENCOUNTER — Inpatient Hospital Stay: Payer: Medicare Other | Attending: Oncology

## 2022-01-27 VITALS — BP 132/69 | HR 80 | Temp 98.7°F | Resp 20 | Ht 62.0 in | Wt 210.0 lb

## 2022-01-27 DIAGNOSIS — C7800 Secondary malignant neoplasm of unspecified lung: Secondary | ICD-10-CM | POA: Diagnosis not present

## 2022-01-27 DIAGNOSIS — C189 Malignant neoplasm of colon, unspecified: Secondary | ICD-10-CM

## 2022-01-27 DIAGNOSIS — Z08 Encounter for follow-up examination after completed treatment for malignant neoplasm: Secondary | ICD-10-CM | POA: Insufficient documentation

## 2022-01-27 DIAGNOSIS — Z85038 Personal history of other malignant neoplasm of large intestine: Secondary | ICD-10-CM | POA: Diagnosis present

## 2022-01-27 LAB — CEA (ACCESS): CEA (CHCC): 7.48 ng/mL — ABNORMAL HIGH (ref 0.00–5.00)

## 2022-01-27 NOTE — Progress Notes (Signed)
[place] OFFICE PROGRESS NOTE   Diagnosis: Colon cancer  INTERVAL HISTORY:   Peggy French returns as scheduled.  She generally feels well.  No pain.  Good appetite.  No difficulty with bowel function.  She reports vertigo for the past few months.  She is beginning physical therapy for the vertigo.  She is now taking care of her husband with Alzheimer's.  Objective:  Vital signs in last 24 hours:  Blood pressure 132/69, pulse 80, temperature 98.7 F (37.1 C), temperature source Oral, resp. rate 20, height 5\' 2"  (1.575 m), weight 210 lb (95.3 kg), SpO2 99 %.    Lymphatics: No cervical, supraclavicular, axillary, or inguinal nodes Resp: Lungs clear bilaterally Cardio: Regular rate and rhythm GI: No hepatosplenomegaly, no mass Vascular: No leg edema     Lab Results:  Lab Results  Component Value Date   WBC 5.0 09/29/2020   HGB 12.8 09/29/2020   HCT 39.9 09/29/2020   MCV 85.6 09/29/2020   PLT 181 09/29/2020   NEUTROABS 2.4 09/29/2020    CMP  Lab Results  Component Value Date   NA 139 07/27/2021   K 3.7 07/27/2021   CL 103 07/27/2021   CO2 27 07/27/2021   GLUCOSE 135 (H) 07/27/2021   BUN 12 07/27/2021   CREATININE 0.82 07/27/2021   CALCIUM 8.9 07/27/2021   PROT 7.4 09/29/2020   ALBUMIN 3.6 09/29/2020   AST 34 09/29/2020   ALT 29 09/29/2020   ALKPHOS 86 09/29/2020   BILITOT 0.4 09/29/2020   GFRNONAA >60 07/27/2021   GFRAA 57 (L) 06/27/2019    Lab Results  Component Value Date   [name]1 4.29 03/25/2021   [name] 7.48 (H) 01/27/2022   Medications: I have reviewed the patient's current medications.   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 She began adjuvant CAPOX chemotherapy on 02/24/2012. She completed cycle 8 on 07/27/2012. Negative surveillance colonoscopy 12/08/2012 Delayed nausea following cycle 1 CAPOX. Aloxi was added beginning with cycle 2 with  improvement. Diabetes. Microcytic anemia. Hemoglobin is normal. She will discontinue iron. Remote history of a "colon tumor" removed endoscopically in 1980. Left lower lung nodule noted on a CT 02/21/2012, slightly larger than on a CT 12/03/2011. Chest CT 06/12/2012 showed the previously seen 5 mm pulmonary nodule in left lower lobe was scarcely visualized, estimated at 3 mm. No other suspicious pulmonary nodules were identified. Chest CT 11/27/2012 showed interval enlargement of the left lower lobe pulmonary nodule with a current measurement of 7 mm x 8 mm. No new pulmonary nodules. CT 02/22/2013 with enlargement of the anterior left lower lobe nodule and no other lung nodules. PET scan 03/02/2013 showed no associated hypermetabolism with the 12 mm left lower lobe pulmonary nodule. No hypermetabolic lymph nodes in the neck, chest, abdomen or pelvis. No abnormal hypermetabolic activity within the liver, pancreas, adrenal glands or spleen. Abnormal FDG accumulation in the posterior left nasopharyngeal mucosa. Status post a wedge resection of the left lower lobe nodule on 03/27/2013 with the pathology confirming metastatic adenocarcinoma of colorectal primary.   Chest CT 07/18/2013-negative for recurrent disease.   Chest CT 09/19/2013 to evaluate for shortness of breath showed postoperative changes in the left lower lobe without evidence of recurrent or metastatic disease. CT chest 04/01/2014 with postsurgical scarring in the left lower lobe. No evidence for metastatic disease in the chest. Stable appearance of mild subpleural reticulation of the lung bases. CT chest/abdomen/pelvis 10/01/2014 with postoperative changes left lower lobe.  No evidence of metastatic disease in the chest, abdomen or pelvis.  CT chest/abdomen/pelvis 11/03/2015 with no evidence of recurrent malignancy. Colonoscopy 03/25/2016-tubular adenomas removed from the transverse colon and rectum CT chest/abdomen/pelvis 06/17/2017-no evidence  of recurrent malignancy. CT chest/abdomen/pelvis 06/14/2018-no evidence of recurrent disease CTs chest/abdomen/pelvis 06/27/2019-no evidence of recurrent disease Elevated [name] 12/25/2020 CTs 01/19/2021-no evidence of metastatic disease Colonoscopy 07/29/2021-8 mm polyp in the proximal transverse colon (tubular adenoma), diverticulosis in the sigmoid colon Status post Port-A-Cath placement 02/22/2012. Status post Port-A-Cath repositioning 03/14/2012. Port-A-Cath removed 06/04/2014 Probable acute laryngopharyngeal dysesthesia following cycle 2 CAPOX. Symptoms resolved with warming.   Oxaliplatin neuropathy with prolonged cold sensitivity. She has persistent neuropathy symptoms involving the feet. 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        Disposition: Peggy French appears stable.  The [name] remains mildly elevated, but has not changed over the past year.  I have a low clinical suspicion for recurrent colon cancer.  The plan is to continue observation.  We will refer her for a PET scan and circulating tumor DNA study if the [name] rises.  She will return for an office visit and [name] in 4 months.  [name], MD  01/27/2022  12:04 PM

## 2022-01-28 ENCOUNTER — Ambulatory Visit (HOSPITAL_COMMUNITY): Payer: Medicare Other | Attending: Physician Assistant | Admitting: Physical Therapy

## 2022-01-28 ENCOUNTER — Encounter (HOSPITAL_COMMUNITY): Payer: Self-pay | Admitting: Physical Therapy

## 2022-01-28 DIAGNOSIS — H8113 Benign paroxysmal vertigo, bilateral: Secondary | ICD-10-CM | POA: Insufficient documentation

## 2022-01-28 DIAGNOSIS — R42 Dizziness and giddiness: Secondary | ICD-10-CM

## 2022-01-28 NOTE — Therapy (Signed)
OUTPATIENT PHYSICAL THERAPY VESTIBULAR EVALUATION     Patient Name: Peggy French MRN: [id number] DOB:1940/12/16, 81 y.o., female Today's Date: 01/28/2022  PCP: Iona Beard MD REFERRING PROVIDER: Jolene Provost, PA-C    PT End of Session - 01/28/22 [phone]     Visit Number 1    Number of Visits 8    Date for PT Re-Evaluation 02/25/22    Authorization Type [employer] (no VL)    PT Start Time 0820    PT Stop Time 0900    PT Time Calculation (min) 40 min    Activity Tolerance Patient tolerated treatment well    Behavior During Therapy [facility] for tasks assessed/performed             Past Medical History:  Diagnosis Date   Asthmatic bronchitis 2011   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 s/p right hemicolectomy/chemo 2013 with mets to the lung s/p LLL wedge resection 2014.   Diabetes mellitus    x over 10 yrs   GERD (gastroesophageal reflux disease)    Gout    H/O hiatal hernia    History of colon cancer    History of shingles    Hyperlipidemia    Hypertension    EKG 10/12 EPIC, chest- 1 view  6/13 EPIC    Last 2D Echo on 05/02/2012 showed EF of greater than 55%   Microcytic anemia    Mild CAD    a. cath by Dr. Debara Pickett in 03/2011 - 20-30% ostial bifurcation stenosis of LAD, otherwise only mild luminal irregularities in LAD/RCA, LVEF 60%, no RWMA.   c. 06/18/16 [facility] after abnormal nuc showed mild non obst CAD   Neuropathy    Obesity    Past Surgical History:  Procedure Laterality Date   ABDOMINAL HYSTERECTOMY     APPENDECTOMY     CARDIAC CATHETERIZATION  2001,2012   CARDIAC CATHETERIZATION N/A 06/18/2016   Procedure: Left Heart Cath and Coronary Angiography;  Surgeon: Nelva Bush, MD;  Location: [place] [facility];  Service: Cardiovascular;  Laterality: N/A;   CATARACT EXTRACTION W/PHACO Left 02/17/2015   Procedure: CATARACT EXTRACTION PHACO AND INTRAOCULAR LENS PLACEMENT (IOC);  Surgeon: Tonny Branch, MD;  Location: [facility];  Service:  Ophthalmology;  Laterality: Left;  CDE:8.01    CATARACT EXTRACTION W/PHACO Right 08/03/2021   Procedure: CATARACT EXTRACTION PHACO AND INTRAOCULAR LENS PLACEMENT (IOC);  Surgeon: Baruch Goldmann, MD;  Location: [facility];  Service: Ophthalmology;  Laterality: Right;  CDE: 13.97   CHOLECYSTECTOMY     COLON SURGERY     PARTIAL COLECTOMY 30 YRS / AGAIN 2013   COLONOSCOPY  11/26/2011   Procedure: COLONOSCOPY;  Surgeon: Rogene Houston, MD;  [facility];  Service: Endoscopy;  Laterality: N/A;  2:15   COLONOSCOPY N/A 12/08/2012   Procedure: COLONOSCOPY;  Surgeon: Rogene Houston, MD;  [facility];  Service: Endoscopy;  Laterality: N/A;  815-moved to [place] to notify pt   COLONOSCOPY N/A 03/25/2016   Procedure: COLONOSCOPY;  Surgeon: Rogene Houston, MD;  [facility];  Service: Endoscopy;  Laterality: N/A;  1200   COLONOSCOPY WITH PROPOFOL N/A 07/29/2021   Procedure: COLONOSCOPY WITH PROPOFOL;  Surgeon: Rogene Houston, MD;  [facility];  Service: Endoscopy;  Laterality: N/A;  11:10   ESOPHAGOGASTRODUODENOSCOPY  06/16/2011   Procedure: ESOPHAGOGASTRODUODENOSCOPY (EGD);  Surgeon: Rogene Houston, MD;  [facility];  Service: Endoscopy;  Laterality: N/A;  2:15  POLYPECTOMY  07/29/2021   Procedure: POLYPECTOMY;  Surgeon: Rogene Houston, MD;  [facility];  Service: Endoscopy;;  transverse colon   PORT-A-CATH REMOVAL N/A 06/04/2014   Procedure: REMOVAL PORT-A-CATH;  Surgeon: Pedro Earls, MD;  Location: [facility];  Service: General;  Laterality: N/A;   PORTACATH PLACEMENT  02/22/2012   Procedure: INSERTION PORT-A-CATH;  Surgeon: Pedro Earls, MD;  Location: [facility];  Service: General;  Laterality: N/A;  left subclavian   VIDEO ASSISTED THORACOSCOPY (VATS)/WEDGE RESECTION Left 03/27/2013   Procedure: VIDEO ASSISTED THORACOSCOPY (VATS)/WEDGE RESECTION;  Surgeon: Grace Isaac, MD;  Location: [place];  Service: Thoracic;  Laterality:  Left;   VIDEO BRONCHOSCOPY N/A 03/27/2013   Procedure: VIDEO BRONCHOSCOPY;  Surgeon: Grace Isaac, MD;  Location: [facility];  Service: Thoracic;  Laterality: N/A;   Patient Active Problem List   Diagnosis Date Noted   Chest pain 09/29/2020   Moderate risk chest pain 06/24/2016   Mixed hyperlipidemia 06/24/2016   Unstable angina ([place]) 06/19/2016   Mild CAD    Chest pain with moderate risk of acute coronary syndrome 06/17/2015   Diabetes mellitus type 2 in obese ([place]) 06/17/2015   Obesity    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 She reports about 4 months ago she began noting dizziness especially when getting out of bed or when stooping down to floor. She has seen ENT and was diagnosed with BPPV. She is akin meclizine as needed. She states since onset, symptoms have gotten worse.   Pt accompanied by: self  PERTINENT HISTORY: Cancer, DM   PAIN:  Are you having pain? No (slight headache)   PRECAUTIONS: Fall  WEIGHT BEARING RESTRICTIONS No  FALLS: Has patient fallen in last 6 months? No  LIVING ENVIRONMENT: Lives with: lives with their family and lives with their spouse Lives in: House/apartment Stairs: Yes: External: 2 steps; can reach both Has following equipment at home: Single point cane  PLOF: Independent with basic ADLs  PATIENT GOALS "getting back into shape"   OBJECTIVE:   DIAGNOSTIC FINDINGS: NA  COGNITION: Overall cognitive status: Within functional limits for tasks  assessed   SENSATION: WFL   Cervical ROM:    Active A/PROM (deg) eval  Flexion 30  Extension 18  Right lateral flexion   Left lateral flexion   Right rotation 50  Left rotation 55  (Blank rows = not tested)   PATIENT SURVEYS:  FOTO 46% function    VESTIBULAR ASSESSMENT     SYMPTOM BEHAVIOR:   Subjective history: See above   Non-Vestibular symptoms: headaches, tinnitus, and nausea/vomiting   Type of dizziness: Spinning/Vertigo   Frequency: daily   Duration: up to about 8 minutes    Aggravating factors: Induced by position change: supine to sit   Relieving factors: head stationary   Progression of symptoms: worse   OCULOMOTOR EXAM:   Ocular Alignment: normal   Ocular ROM: No Limitations   Smooth Pursuits: intact   MOTION SENSITIVITY:    Motion Sensitivity Quotient  Intensity: 0 =  none, 1 = Lightheaded, 2 = Mild, 3 = Moderate, 4 = Severe, 5 = Vomiting  Intensity  1. Sitting to supine 2  2. Supine to L side 3  3. Supine to R side 3  4. Supine to sitting   5. L Hallpike-Dix 2  6. Up from L    7. R Hallpike-Dix 2  8. Up from R  3  9. Sitting, head  tipped to L knee   10. Head up from L  knee   11. Sitting, head  tipped to R knee   12. Head up from R  knee   13. Sitting head turns x5 3  14.Sitting head nods x5 3  15. In stance, 180  turn to L    16. In stance, 180  turn to R       VESTIBULAR TREATMENT:  Canalith Repositioning:   Comment: not completed today, Dixhall pike negative (dizzy for 5 seconds but no nystagmus)   PATIENT EDUCATION: Education details: on eval findings, POC and HEP Person educated: Patient Education method: Explanation Education comprehension: verbalized understanding  HEP:  Access Code: [email or web address] URL: [email or web address] Date: 01/28/2022 Prepared by: Josue Hector  Exercises - Vestibular Habituation - Seated Horizontal Head Rotation  - 3 x daily - 7 x weekly - 1-2 sets - 10 reps - Seated  Head Nods Vestibular Habituation  - 3 x daily - 7 x weekly - 1-2 sets - 10 reps GOALS: SHORT TERM GOALS: Target date: 02/11/2022  Patient will be independent with initial HEP and self-management strategies to improve functional outcomes Baseline:  Goal status: INITIAL    LONG TERM GOALS: Target date: 02/25/2022  Patient will be independent with advanced HEP and self-management strategies to improve functional outcomes Baseline:  Goal status: INITIAL  2.  Patient will improve FOTO score to predicted value to indicate improvement in functional outcomes Baseline: 46% function Goal status: INITIAL  3.  Patient will demo improved RT cervical rotation by at least 5 degrees in order to improve ability to scan environment for safety and while driving. Baseline: 50 deg Goal status: INITIAL  4. Patient will report a decrease in vertigo symptoms by at least 50% for improved quality of life, functional level with ADLs and reduced risk for falls.  Baseline:  Goal status: INITIAL  ASSESSMENT:  CLINICAL IMPRESSION: Patient is a 81 y.o. female who presents to physical therapy with complaint of vertigo. Patient demonstrates increased vestibular symptoms with provocative testing which is negatively impacting patient ability to perform ADLs and functional mobility tasks. Patient will benefit from skilled physical therapy services to address these deficits to improve level of function with ADLs, functional mobility tasks, and reduce risk for falls.     OBJECTIVE IMPAIRMENTS decreased balance, decreased ROM, and dizziness.   ACTIVITY LIMITATIONS standing, transfers, bed mobility, and locomotion level  PARTICIPATION LIMITATIONS: meal prep, cleaning, laundry, driving, shopping, community activity, and yard work  PERSONAL FACTORS Age are also affecting patient's functional outcome.   REHAB POTENTIAL: Good  CLINICAL DECISION MAKING: Stable/uncomplicated  EVALUATION COMPLEXITY: Low   PLAN: PT  FREQUENCY: 1-2x/week  PT DURATION: 4 weeks  PLANNED INTERVENTIONS: Therapeutic exercises, Therapeutic activity, Neuromuscular re-education, Balance training, Gait training, Patient/Family education, Self Care, Joint mobilization, Stair training, Vestibular training, and Canalith repositioning  PLAN FOR NEXT SESSION: Retest Dixhallpike, complete [place] if indicated. Progress habituation and balance as tolerated.   9:02 AM, 01/28/22 Josue Hector PT DPT  Physical Therapist with [facility]  [phone]  

## 2022-02-17 ENCOUNTER — Encounter (HOSPITAL_COMMUNITY): Payer: Self-pay | Admitting: Physical Therapy

## 2022-02-17 ENCOUNTER — Ambulatory Visit (HOSPITAL_COMMUNITY): Payer: Medicare Other | Attending: Physician Assistant | Admitting: Physical Therapy

## 2022-02-17 DIAGNOSIS — R42 Dizziness and giddiness: Secondary | ICD-10-CM | POA: Diagnosis present

## 2022-02-17 NOTE — Therapy (Signed)
OUTPATIENT PHYSICAL THERAPY VESTIBULAR EVALUATION     Patient Name: Peggy French MRN: [id number] DOB:01-Feb-1941, 81 y.o., female Today's Date: 02/17/2022  PCP: Iona Beard MD REFERRING PROVIDER: Jolene Provost, PA-C      Past Medical History:  Diagnosis Date   Asthmatic bronchitis 2011   Colon carcinoma metastatic to lung ([place]) 03/27/2013   a. s/p right hemicolectomy/chemo 2013 with mets to the lung s/p LLL wedge resection 2014.   Diabetes mellitus    x over 10 yrs   GERD (gastroesophageal reflux disease)    Gout    H/O hiatal hernia    History of colon cancer    History of shingles    Hyperlipidemia    Hypertension    EKG 10/12 EPIC, chest- 1 view  6/13 EPIC    Last 2D Echo on 05/02/2012 showed EF of greater than 55%   Microcytic anemia    Mild CAD    a. cath by Dr. Debara Pickett in 03/2011 - 20-30% ostial bifurcation stenosis of LAD, otherwise only mild luminal irregularities in LAD/RCA, LVEF 60%, no RWMA.   c. 06/18/16 LHC after abnormal nuc showed mild non obst CAD   Neuropathy    Obesity    Past Surgical History:  Procedure Laterality Date   ABDOMINAL HYSTERECTOMY     APPENDECTOMY     CARDIAC CATHETERIZATION  2001,2012   CARDIAC CATHETERIZATION N/A 06/18/2016   Procedure: Left Heart Cath and Coronary Angiography;  Surgeon: Nelva Bush, MD;  Location: [place] [facility];  Service: Cardiovascular;  Laterality: N/A;   CATARACT EXTRACTION W/PHACO Left 02/17/2015   Procedure: CATARACT EXTRACTION PHACO AND INTRAOCULAR LENS PLACEMENT (IOC);  Surgeon: Tonny Branch, MD;  [facility]: [facility];  Service: Ophthalmology;  Laterality: Left;  CDE:8.01    CATARACT EXTRACTION W/PHACO Right 08/03/2021   Procedure: CATARACT EXTRACTION PHACO AND INTRAOCULAR LENS PLACEMENT (IOC);  Surgeon: Baruch Goldmann, MD;  Location: [facility];  Service: Ophthalmology;  Laterality: Right;  CDE: 13.97   CHOLECYSTECTOMY     COLON SURGERY     PARTIAL COLECTOMY 30 YRS / AGAIN 2013   COLONOSCOPY  11/26/2011    Procedure: COLONOSCOPY;  Surgeon: Rogene Houston, MD;  [facility];  Service: Endoscopy;  Laterality: N/A;  2:15   COLONOSCOPY N/A 12/08/2012   Procedure: COLONOSCOPY;  Surgeon: Rogene Houston, MD;  [facility];  Service: Endoscopy;  Laterality: N/A;  815-moved to [place] to notify pt   COLONOSCOPY N/A 03/25/2016   Procedure: COLONOSCOPY;  Surgeon: Rogene Houston, MD;  [facility];  Service: Endoscopy;  Laterality: N/A;  1200   COLONOSCOPY WITH PROPOFOL N/A 07/29/2021   Procedure: COLONOSCOPY WITH PROPOFOL;  Surgeon: Rogene Houston, MD;  [facility];  Service: Endoscopy;  Laterality: N/A;  11:10   ESOPHAGOGASTRODUODENOSCOPY  06/16/2011   Procedure: ESOPHAGOGASTRODUODENOSCOPY (EGD);  Surgeon: Rogene Houston, MD;  [facility];  Service: Endoscopy;  Laterality: N/A;  2:15   POLYPECTOMY  07/29/2021   Procedure: POLYPECTOMY;  Surgeon: Rogene Houston, MD;  [facility];  Service: Endoscopy;;  transverse colon   PORT-A-CATH REMOVAL N/A 06/04/2014   Procedure: REMOVAL PORT-A-CATH;  Surgeon: Pedro Earls, MD;  [facility]: [facility];  Service: General;  Laterality: N/A;   PORTACATH PLACEMENT  02/22/2012   Procedure: INSERTION PORT-A-CATH;  Surgeon: Pedro Earls, MD;  [facility];  Service: General;  Laterality: N/A;  left subclavian   VIDEO ASSISTED THORACOSCOPY (VATS)/WEDGE RESECTION Left  03/27/2013   Procedure: VIDEO ASSISTED THORACOSCOPY (VATS)/WEDGE RESECTION;  Surgeon: Grace Isaac, MD;  Location: [place];  Service: Thoracic;  Laterality: Left;   VIDEO BRONCHOSCOPY N/A 03/27/2013   Procedure: VIDEO BRONCHOSCOPY;  Surgeon: Grace Isaac, MD;  Location: [facility];  Service: Thoracic;  Laterality: N/A;   Patient Active Problem List   Diagnosis Date Noted   Chest pain 09/29/2020   Moderate risk chest pain 06/24/2016   Mixed hyperlipidemia 06/24/2016   Unstable angina ([place]) 06/19/2016   Mild CAD    Chest pain with  moderate risk of acute coronary syndrome 06/17/2015   Diabetes mellitus type 2 in obese ([place]) 06/17/2015   Obesity    Colon carcinoma metastatic to lung ([place]) 03/27/2013   S/P right colectomy 01/07/2012   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  Pt states that she has had dizziness for years, she takes Meclizine which really helps.  Her last bout started about 3 months ago.  She feels off balance not spinning.   PERTINENT HISTORY: Cancer, DM   PAIN:  Are you having pain? No (slight headache)   PRECAUTIONS: Fall  WEIGHT BEARING RESTRICTIONS No  FALLS: Has patient fallen in last 6 months? No  LIVING ENVIRONMENT: Lives with: lives with their family and lives with their spouse Lives in: House/apartment Stairs: Yes: External: 2 steps; can reach both Has following equipment at home: Single point cane  PLOF: Independent with basic ADLs  PATIENT GOALS "getting back into shape"   OBJECTIVE:     Cervical ROM:    Active A/PROM (deg) eval  Flexion 30  Extension 18  Right lateral flexion   Left lateral flexion   Right rotation 50  Left rotation 55  (Blank rows = not tested)   PATIENT SURVEYS:  FOTO 46% function    VESTIBULAR ASSESSMENT     SYMPTOM BEHAVIOR:   Subjective history: See above   Non-Vestibular symptoms: headaches, tinnitus, and nausea/vomiting   Type of dizziness: Spinning/Vertigo   Frequency: daily   Duration: up to about 8 minutes    Aggravating factors: Induced by position change: supine to sit   Relieving factors: head stationary   Progression of  symptoms: worse   OCULOMOTOR EXAM:   Ocular Alignment: normal   Ocular ROM: No Limitations   Smooth Pursuits: intact   MOTION SENSITIVITY:    Motion Sensitivity Quotient  Intensity: 0 = none, 1 = Lightheaded, 2 = Mild, 3 = Moderate, 4 = Severe, 5 = Vomiting  Intensity 8\9\23  1. Sitting to supine 2   2. Supine to L side 3   3. Supine to R side 3   4. Supine to sitting    5. L Hallpike-Dix 2 4  6. Up from L   4  7. R Hallpike-Dix 2 3  8. Up from R  3 3  9. Sitting, head  tipped to L knee    10. Head up from L  knee    11. Sitting, head  tipped to R knee    12. Head up from R  knee  13. Sitting head turns x5 3   14.Sitting head nods x5 3   15. In stance, 180  turn to L     16. In stance, 180  turn to R        VESTIBULAR TREATMENT:  Canalith Repositioning:  8/9:  Lt Eply's maneuver completed x 2 reps.  Pt continues to have nystagmus.  Pt has significant rounded shoulder.  Attempted scapular retraction but this           Is very difficult for pt.  Attempted manual but pt is hyper sensitive.   Evaluation:  Comment: not completed today, Dixhall pike negative (dizzy for 5 seconds but no nystagmus)   PATIENT EDUCATION: 02/17/22:   What BPPV is and why we complete the Eply's manuver, the importance of good posture to prevent BPPV.  Scapular retraction and self manual.  Education details: on eval findings, POC and HEP Person educated: Patient Education method: Explanation Education comprehension: verbalized understanding  HEP:  Access Code: [email or web address] URL: [email or web address] Date: 01/28/2022 Prepared by: Josue Hector  Exercises 8/9 :  scapular retraction.  - Vestibular Habituation - Seated Horizontal Head Rotation  - 3 x daily - 7 x weekly - 1-2 sets - 10 reps - Seated Head Nods Vestibular Habituation  - 3 x daily - 7 x weekly - 1-2 sets - 10 reps GOALS: SHORT TERM GOALS: Target date: 02/11/2022  Patient will be independent with initial HEP and  self-management strategies to improve functional outcomes Baseline:  Goal status: IN PROGRESS    LONG TERM GOALS: Target date: 02/25/2022  Patient will be independent with advanced HEP and self-management strategies to improve functional outcomes Baseline:  Goal status: IN PROGRESS  2.  Patient will improve FOTO score to predicted value to indicate improvement in functional outcomes Baseline: 46% function Goal status: IN PROGRESS  3.  Patient will demo improved RT cervical rotation by at least 5 degrees in order to improve ability to scan environment for safety and while driving. Baseline: 50 deg Goal status: IN PROGRESS  4. Patient will report a decrease in vertigo symptoms by at least 50% for improved quality of life, functional level with ADLs and reduced risk for falls.  Baseline:  Goal status: IN PROGRESS  ASSESSMENT:  CLINICAL IMPRESSION: Pt has RT nystagmus with LT Dix HalPike maneuver.  Eplys completed 2x with decreased but continued sx.   Patient demonstrates increased vestibular symptoms with provocative testing which is negatively impacting patient ability to perform ADLs and functional mobility tasks. Patient will benefit from skilled physical therapy services to address these deficits to improve level of function with ADLs, functional mobility tasks, and reduce risk for falls.     OBJECTIVE IMPAIRMENTS decreased balance, decreased ROM, and dizziness.   ACTIVITY LIMITATIONS standing, transfers, bed mobility, and locomotion level  PARTICIPATION LIMITATIONS: meal prep, cleaning, laundry, driving, shopping, community activity, and yard work  PERSONAL FACTORS Age are also affecting patient's functional outcome.   REHAB POTENTIAL: Good  CLINICAL DECISION MAKING: Stable/uncomplicated  EVALUATION COMPLEXITY: Low   PLAN: PT FREQUENCY: 1-2x/week  PT DURATION: 4 weeks  PLANNED INTERVENTIONS: Therapeutic exercises, Therapeutic activity, Neuromuscular re-education,  Balance training, Gait training, Patient/Family education, Self Care, Joint mobilization, Stair training, Vestibular training, and Canalith repositioning  PLAN FOR NEXT SESSION: . Continue Eply's maneuver.  Test roll testing.   Rayetta Humphrey, PT CLT 1600

## 2022-02-23 ENCOUNTER — Encounter (HOSPITAL_COMMUNITY): Payer: Medicare Other | Admitting: Physical Therapy

## 2022-02-25 ENCOUNTER — Ambulatory Visit (HOSPITAL_COMMUNITY): Payer: Medicare Other | Admitting: Physical Therapy

## 2022-02-25 DIAGNOSIS — R42 Dizziness and giddiness: Secondary | ICD-10-CM

## 2022-02-25 NOTE — Therapy (Signed)
OUTPATIENT PHYSICAL THERAPY VESTIBULAR EVALUATION     Patient Name: Peggy French MRN: [id number] DOB:[birth year], 81 y.o., female Today's Date: 02/25/2022  Progress Note Reporting Period 01/28/22 to 02/25/22  See note below for Objective Data and Assessment of Progress/Goals.     PCP: Iona Beard MD REFERRING PROVIDER: Jolene Provost, PA-C    PT End of Session - 02/25/22 1532     Visit Number 3    Number of Visits 8    Date for PT Re-Evaluation 03/25/22    Authorization Type [employer] (no VL)    PT Start Time 1530    PT Stop Time 1555    PT Time Calculation (min) 25 min    Activity Tolerance Patient tolerated treatment well    Behavior During Therapy [facility] for tasks assessed/performed              Past Medical History:  Diagnosis Date   Asthmatic bronchitis 2011   Colon carcinoma metastatic to lung ([place]) 03/27/2013   a. s/p right hemicolectomy/chemo 2013 with mets to the lung s/p LLL wedge resection 2014.   Diabetes mellitus    x over 10 yrs   GERD (gastroesophageal reflux disease)    Gout    H/O hiatal hernia    History of colon cancer    History of shingles    Hyperlipidemia    Hypertension    EKG 10/12 EPIC, chest- 1 view  6/13 EPIC    Last 2D Echo on 05/02/2012 showed EF of greater than 55%   Microcytic anemia    Mild CAD    a. cath by Dr. Debara Pickett in 03/2011 - 20-30% ostial bifurcation stenosis of LAD, otherwise only mild luminal irregularities in LAD/RCA, LVEF 60%, no RWMA.   c. 06/18/16 LHC after abnormal nuc showed mild non obst CAD   Neuropathy    Obesity    Past Surgical History:  Procedure Laterality Date   ABDOMINAL HYSTERECTOMY     APPENDECTOMY     CARDIAC CATHETERIZATION  2001,2012   CARDIAC CATHETERIZATION N/A 06/18/2016   Procedure: Left Heart Cath and Coronary Angiography;  Surgeon: Nelva Bush, MD;  Location: [place] [facility];  Service: Cardiovascular;  Laterality: N/A;   CATARACT EXTRACTION W/PHACO Left 02/17/2015   Procedure:  CATARACT EXTRACTION PHACO AND INTRAOCULAR LENS PLACEMENT (IOC);  Surgeon: Tonny Branch, MD;  [facility];  Service: Ophthalmology;  Laterality: Left;  CDE:8.01    CATARACT EXTRACTION W/PHACO Right 08/03/2021   Procedure: CATARACT EXTRACTION PHACO AND INTRAOCULAR LENS PLACEMENT (IOC);  Surgeon: Baruch Goldmann, MD;  [facility]: [facility];  Service: Ophthalmology;  Laterality: Right;  CDE: 13.97   CHOLECYSTECTOMY     COLON SURGERY     PARTIAL COLECTOMY 30 YRS / AGAIN 2013   COLONOSCOPY  11/26/2011   Procedure: COLONOSCOPY;  Surgeon: Rogene Houston, MD;  [facility];  Service: Endoscopy;  Laterality: N/A;  2:15   COLONOSCOPY N/A 12/08/2012   Procedure: COLONOSCOPY;  Surgeon: Rogene Houston, MD;  [facility];  Service: Endoscopy;  Laterality: N/A;  815-moved to [place] to notify pt   COLONOSCOPY N/A 03/25/2016   Procedure: COLONOSCOPY;  Surgeon: Rogene Houston, MD;  [facility];  Service: Endoscopy;  Laterality: N/A;  1200   COLONOSCOPY WITH PROPOFOL N/A 07/29/2021   Procedure: COLONOSCOPY WITH PROPOFOL;  Surgeon: Rogene Houston, MD;  [facility];  Service: Endoscopy;  Laterality: N/A;  11:10   ESOPHAGOGASTRODUODENOSCOPY  06/16/2011  Procedure: ESOPHAGOGASTRODUODENOSCOPY (EGD);  Surgeon: Rogene Houston, MD;  [facility];  Service: Endoscopy;  Laterality: N/A;  2:15   POLYPECTOMY  07/29/2021   Procedure: POLYPECTOMY;  Surgeon: Rogene Houston, MD;  [facility];  Service: Endoscopy;;  transverse colon   PORT-A-CATH REMOVAL N/A 06/04/2014   Procedure: REMOVAL PORT-A-CATH;  Surgeon: Pedro Earls, MD;  [facility];  Service: General;  Laterality: N/A;   PORTACATH PLACEMENT  02/22/2012   Procedure: INSERTION PORT-A-CATH;  Surgeon: Pedro Earls, MD;  [facility];  Service: General;  Laterality: N/A;  left subclavian   VIDEO ASSISTED THORACOSCOPY (VATS)/WEDGE RESECTION Left 03/27/2013   Procedure: VIDEO ASSISTED  THORACOSCOPY (VATS)/WEDGE RESECTION;  Surgeon: Grace Isaac, MD;  Location: [place];  Service: Thoracic;  Laterality: Left;   VIDEO BRONCHOSCOPY N/A 03/27/2013   Procedure: VIDEO BRONCHOSCOPY;  Surgeon: Grace Isaac, MD;  Location: [facility];  Service: Thoracic;  Laterality: N/A;   Patient Active Problem List   Diagnosis Date Noted   Chest pain 09/29/2020   Moderate risk chest pain 06/24/2016   Mixed hyperlipidemia 06/24/2016   Unstable angina ([place]) 06/19/2016   Mild CAD    Chest pain with moderate risk of acute coronary syndrome 06/17/2015   Diabetes mellitus type 2 in obese ([place]) 06/17/2015   Obesity    Colon carcinoma metastatic to lung ([place]) 03/27/2013   S/P right colectomy 01/07/2012   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 She woke up and felt like she was stumbling all over.   PERTINENT HISTORY: Cancer, DM   PAIN:  Are you having pain? Yes: NPRS scale: 7/10 Pain location: neck Pain description: aching, sharp, shooting  Aggravating factors: unsure  Relieving factors: heat, hot shower    PRECAUTIONS: Fall  WEIGHT BEARING RESTRICTIONS No  FALLS: Has patient fallen in last 6 months? No  LIVING ENVIRONMENT: Lives with: lives with their family and lives with their spouse Lives in: House/apartment Stairs: Yes: External: 2 steps; can reach both Has following equipment at home: Single point cane  PLOF: Independent with basic ADLs  PATIENT GOALS "getting back into shape"   OBJECTIVE:     Cervical ROM:    Active A/PROM (deg) eval AROM 02/25/22  Flexion 30 32   Extension 18 16  Right lateral flexion    Left lateral flexion    Right rotation 50 43  Left rotation 55 48  (Blank rows = not tested)   PATIENT SURVEYS:  FOTO 48% function    VESTIBULAR ASSESSMENT     SYMPTOM BEHAVIOR:   Subjective history: See above   Non-Vestibular symptoms: headaches, tinnitus, and nausea/vomiting   Type of dizziness: Spinning/Vertigo   Frequency: daily   Duration: up to about 8 minutes    Aggravating factors: Induced by position change: supine to sit   Relieving factors: head stationary   Progression of symptoms: worse   OCULOMOTOR EXAM:   Ocular Alignment: normal   Ocular ROM: No Limitations   Smooth Pursuits: intact   MOTION SENSITIVITY:    Motion Sensitivity Quotient  Intensity:  0 = none, 1 = Lightheaded, 2 = Mild, 3 = Moderate, 4 = Severe, 5 = Vomiting  Intensity 8\9\23 02/25/22  1. Sitting to supine 2  0  2. Supine to L side 3  0  3. Supine to R side 3  0  4. Supine to sitting   3  5. L Hallpike-Dix 2 4 0  6. Up from L   4 3  7. R Hallpike-Dix 2 3 0  8. Up from R  '3 3 3  ' 9. Sitting, head  tipped to L knee     10. Head up from L  knee     11. Sitting, head  tipped to R knee     12. Head up from R  knee     13. Sitting head turns x5 3    14.Sitting head nods x5 3    15. In stance, 180  turn to L      16. In stance, 180  turn to R         VESTIBULAR TREATMENT:  Canalith Repositioning:  8/9:  Lt Eply's maneuver completed x 2 reps.  Pt continues to have nystagmus.  Pt has significant rounded shoulder.  Attempted scapular retraction but this           Is very difficult for pt.  Attempted manual but pt is hyper sensitive.   Evaluation:  Comment: not completed today, Dixhall pike negative (dizzy for 5 seconds but no nystagmus)   TODAYS TREATMENT:  02/25/22 Reassess FOTO AROM  [place]ley bilateral (negative, but does note dizziness with supine to sit position bilateral, no nystagmus seen)  PATIENT EDUCATION: 02/17/22:   What  BPPV is and why we complete the Eply's manuver, the importance of good posture to prevent BPPV.  Scapular retraction and self manual.  Education details: on eval findings, POC and HEP Person educated: Patient Education method: Explanation Education comprehension: verbalized understanding  HEP:  Access Code: [email or web address] URL: [email or web address] Date: 01/28/2022 Prepared by: Josue Hector  Exercises 8/9 :  scapular retraction.  - Vestibular Habituation - Seated Horizontal Head Rotation  - 3 x daily - 7 x weekly - 1-2 sets - 10 reps - Seated Head Nods Vestibular Habituation  - 3 x daily - 7 x weekly - 1-2 sets - 10 reps GOALS: SHORT TERM GOALS: Target date: 02/11/2022  Patient will be independent with initial HEP and self-management strategies to improve functional outcomes Baseline:  Goal status: IN PROGRESS    LONG TERM GOALS: Target date: 02/25/2022  Patient will be independent with advanced HEP and self-management strategies to improve functional outcomes Baseline:  Goal status: IN PROGRESS  2.  Patient will improve FOTO score to predicted value to indicate improvement in functional outcomes Baseline: 48% function (was 46%) Goal status: IN PROGRESS  3.  Patient will demo improved RT cervical rotation by at least 5 degrees in order to improve ability to scan environment for safety and while driving. Baseline: 43 deg Goal status: IN PROGRESS  4. Patient will report a decrease in vertigo symptoms by at least 50% for improved quality of life, functional level with ADLs and reduced risk for falls.  Baseline: Reports 90% until today's episode Goal status: Partially MET ASSESSMENT:  CLINICAL IMPRESSION: Performed reassess today per end of certification. Patient reports improved subjective complaint, but notes recent dizzy spell this morning. She is negative bilateral with Eppley but continues to have some positional sensitivity, namely with supine to sitting position  change. Patient  would continue to benefit from skilled therapy services to progress habituation and balance for reduce complaint of dizziness, improved functional ability and reduced risk for falls.    OBJECTIVE IMPAIRMENTS decreased balance, decreased ROM, and dizziness.   ACTIVITY LIMITATIONS standing, transfers, bed mobility, and locomotion level  PARTICIPATION LIMITATIONS: meal prep, cleaning, laundry, driving, shopping, community activity, and yard work  PERSONAL FACTORS Age are also affecting patient's functional outcome.   REHAB POTENTIAL: Good  CLINICAL DECISION MAKING: Stable/uncomplicated  EVALUATION COMPLEXITY: Low   PLAN: PT FREQUENCY: 1-2x/week  PT DURATION: 4 weeks  PLANNED INTERVENTIONS: Therapeutic exercises, Therapeutic activity, Neuromuscular re-education, Balance training, Gait training, Patient/Family education, Self Care, Joint mobilization, Stair training, Vestibular training, and Canalith repositioning  PLAN FOR NEXT SESSION: . Log roll test, balance and habituation.   3:59 PM, 02/25/22 Josue Hector PT DPT  Physical Therapist with [facility]  [phone]

## 2022-03-01 ENCOUNTER — Ambulatory Visit (HOSPITAL_COMMUNITY): Payer: Medicare Other | Admitting: Physical Therapy

## 2022-03-01 DIAGNOSIS — R42 Dizziness and giddiness: Secondary | ICD-10-CM

## 2022-03-01 NOTE — Therapy (Signed)
OUTPATIENT PHYSICAL THERAPY VESTIBULAR EVALUATION     Patient Name: Peggy French MRN: [id number] DOB:[birth year], 81 y.o., female Today's Date: 03/01/2022   See note below for Objective Data and Assessment of Progress/Goals.     PCP: Iona Beard MD REFERRING PROVIDER: Jolene Provost, PA-C    PT End of Session - 03/01/22 1434     Visit Number 4    Number of Visits 8    Date for PT Re-Evaluation 03/25/22    Authorization Type [employer] M[employer] (no VL)    PT Start Time 1436    PT Stop Time 1515    PT Time Calculation (min) 39 min    Activity Tolerance Patient tolerated treatment well    Behavior During Therapy WFL for tasks assessed/performed              Past Medical History:  Diagnosis Date   Asthmatic bronchitis 2011   Colon carcinoma metastatic to lung ([place]) 03/27/2013   a. s/p right hemicolectomy/chemo 2013 with mets to the lung s/p LLL wedge resection 2014.   Diabetes mellitus    x over 10 yrs   GERD (gastroesophageal reflux disease)    Gout    H/O hiatal hernia    History of colon cancer    History of shingles    Hyperlipidemia    Hypertension    EKG 10/12 EPIC, chest- 1 view  6/13 EPIC    Last 2D Echo on 05/02/2012 showed EF of greater than 55%   Microcytic anemia    Mild CAD    a. cath by Dr. Debara Pickett in 03/2011 - 20-30% ostial bifurcation stenosis of LAD, otherwise only mild luminal irregularities in LAD/RCA, LVEF 60%, no RWMA.   c. 06/18/16 LHC after abnormal nuc showed mild non obst CAD   Neuropathy    Obesity    Past Surgical History:  Procedure Laterality Date   ABDOMINAL HYSTERECTOMY     APPENDECTOMY     CARDIAC CATHETERIZATION  2001,2012   CARDIAC CATHETERIZATION N/A 06/18/2016   Procedure: Left Heart Cath and Coronary Angiography;  Surgeon: Nelva Bush, MD;  Location: [facility];  Service: Cardiovascular;  Laterality: N/A;   CATARACT EXTRACTION W/PHACO Left 02/17/2015   Procedure: CATARACT EXTRACTION PHACO AND INTRAOCULAR LENS  PLACEMENT (IOC);  Surgeon: Tonny Branch, MD;  [facility];  Service: Ophthalmology;  Laterality: Left;  CDE:8.01    CATARACT EXTRACTION W/PHACO Right 08/03/2021   Procedure: CATARACT EXTRACTION PHACO AND INTRAOCULAR LENS PLACEMENT (IOC);  Surgeon: Baruch Goldmann, MD;  [facility]: [facility];  Service: Ophthalmology;  Laterality: Right;  CDE: 13.97   CHOLECYSTECTOMY     COLON SURGERY     PARTIAL COLECTOMY 30 YRS / AGAIN 2013   COLONOSCOPY  11/26/2011   Procedure: COLONOSCOPY;  Surgeon: Rogene Houston, MD;  [facility];  Service: Endoscopy;  Laterality: N/A;  2:15   COLONOSCOPY N/A 12/08/2012   Procedure: COLONOSCOPY;  Surgeon: Rogene Houston, MD;  [facility];  Service: Endoscopy;  Laterality: N/A;  815-moved to [place] to notify pt   COLONOSCOPY N/A 03/25/2016   Procedure: COLONOSCOPY;  Surgeon: Rogene Houston, MD;  [facility];  Service: Endoscopy;  Laterality: N/A;  1200   COLONOSCOPY WITH PROPOFOL N/A 07/29/2021   Procedure: COLONOSCOPY WITH PROPOFOL;  Surgeon: Rogene Houston, MD;  [facility];  Service: Endoscopy;  Laterality: N/A;  11:10   ESOPHAGOGASTRODUODENOSCOPY  06/16/2011   Procedure: ESOPHAGOGASTRODUODENOSCOPY (EGD);  Surgeon: Mechele Dawley  Laural Golden, MD;  [facility];  Service: Endoscopy;  Laterality: N/A;  2:15   POLYPECTOMY  07/29/2021   Procedure: POLYPECTOMY;  Surgeon: Rogene Houston, MD;  [facility];  Service: Endoscopy;;  transverse colon   PORT-A-CATH REMOVAL N/A 06/04/2014   Procedure: REMOVAL PORT-A-CATH;  Surgeon: Pedro Earls, MD;  [facility];  Service: General;  Laterality: N/A;   PORTACATH PLACEMENT  02/22/2012   Procedure: INSERTION PORT-A-CATH;  Surgeon: Pedro Earls, MD;  [facility];  Service: General;  Laterality: N/A;  left subclavian   VIDEO ASSISTED THORACOSCOPY (VATS)/WEDGE RESECTION Left 03/27/2013   Procedure: VIDEO ASSISTED THORACOSCOPY (VATS)/WEDGE RESECTION;  Surgeon: Grace Isaac, MD;  Location: [facility];  Service: Thoracic;  Laterality: Left;   VIDEO BRONCHOSCOPY N/A 03/27/2013   Procedure: VIDEO BRONCHOSCOPY;  Surgeon: Grace Isaac, MD;  Location: [facility];  Service: Thoracic;  Laterality: N/A;   Patient Active Problem List   Diagnosis Date Noted   Chest pain 09/29/2020   Moderate risk chest pain 06/24/2016   Mixed hyperlipidemia 06/24/2016   Unstable angina ([place]) 06/19/2016   Mild CAD    Chest pain with moderate risk of acute coronary syndrome 06/17/2015   Diabetes mellitus type 2 in obese ([place]) 06/17/2015   Obesity    Colon carcinoma metastatic to lung ([place]) 03/27/2013   S/P right colectomy 01/07/2012   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 She does get dizzy in the morning but has good days and bad days she was walking off balance yesterday. She thinks some of this may be due to blood pressure. She has a slight headache today.   PERTINENT HISTORY: Cancer, DM   PAIN:  Are you having pain? Yes: NPRS scale: 8/10 Pain location: forehead Pain description: aching  Aggravating factors: unsure  Relieving factors: heat, hot shower    PRECAUTIONS: Fall  WEIGHT BEARING RESTRICTIONS No  FALLS: Has patient fallen in last 6 months? No  LIVING ENVIRONMENT: Lives with: lives with their family and lives with their spouse Lives in: House/apartment Stairs: Yes: External: 2 steps; can reach both Has following equipment at home: Single point cane  PLOF: Independent with basic ADLs  PATIENT GOALS "getting back into shape"   OBJECTIVE:     Cervical ROM:    Active A/PROM  (deg) eval AROM 02/25/22  Flexion 30 32  Extension 18 16  Right lateral flexion    Left lateral flexion    Right rotation 50 43  Left rotation 55 48  (Blank rows = not tested)   PATIENT SURVEYS:  FOTO 48% function    VESTIBULAR ASSESSMENT     SYMPTOM BEHAVIOR:   Subjective history: See above   Non-Vestibular symptoms: headaches, tinnitus, and nausea/vomiting   Type of dizziness: Spinning/Vertigo   Frequency: daily   Duration: up to about 8 minutes    Aggravating factors: Induced by position change: supine to sit   Relieving factors: head stationary   Progression of symptoms: worse   OCULOMOTOR EXAM:   Ocular Alignment: normal   Ocular ROM: No Limitations   Smooth Pursuits: intact  MOTION SENSITIVITY:    Motion Sensitivity Quotient  Intensity: 0 = none, 1 = Lightheaded, 2 = Mild, 3 = Moderate, 4 = Severe, 5 = Vomiting  Intensity 8\9\23 02/25/22  1. Sitting to supine 2  0  2. Supine to L side 3  0  3. Supine to R side 3  0  4. Supine to sitting   3  5. L Hallpike-Dix 2 4 0  6. Up from L   4 3  7. R Hallpike-Dix 2 3 0  8. Up from R  '3 3 3  ' 9. Sitting, head  tipped to L knee     10. Head up from L  knee     11. Sitting, head  tipped to R knee     12. Head up from R  knee     13. Sitting head turns x5 3    14.Sitting head nods x5 3    15. In stance, 180  turn to L      16. In stance, 180  turn to R         VESTIBULAR TREATMENT:  Canalith Repositioning:  8/9:  Lt Eply's maneuver completed x 2 reps.  Pt continues to have nystagmus.  Pt has significant rounded shoulder.  Attempted scapular retraction but this           Is very difficult for pt.  Attempted manual but pt is hyper sensitive.   Evaluation:  Comment: not completed today, Dixhall pike negative (dizzy for 5 seconds but no nystagmus)   TODAYS TREATMENT: 03/01/22 Log roll test LT and RT (mild dizziness LT, no symptoms on RT)  LT horizontal repositioning maneuver   Seated head turns/ nods   x 10 each (no symptoms)  Standing head turns/ nods  x 10 (mild dizziness)  Tandem stance 2 x 30" each   02/25/22 Reassess FOTO AROM  Eplley bilateral (negative, but does note dizziness with supine to sit position bilateral, no nystagmus seen)  PATIENT EDUCATION: 02/17/22:   What BPPV is and why we complete the Eply's manuver, the importance of good posture to prevent BPPV.  Scapular retraction and self manual.  Education details: on eval findings, POC and HEP Person educated: Patient Education method: Explanation Education comprehension: verbalized understanding  HEP:  Access Code: [email or web address] URL: [email or web address]  03/01/22 - Romberg Stance with Head Rotation  - 2-3 x daily - 7 x weekly - 1-2 sets - 10 reps - Romberg Stance with Head Nods  - 2-3 x daily - 7 x weekly - 1-2 sets - 10 reps - Standing Tandem Balance with Counter Support  - 2-3 x daily - 7 x weekly - 1 sets - 3 reps - 20 second hold  Date: 01/28/2022 Prepared by: Josue Hector  Exercises 8/9 :  scapular retraction.  - Vestibular Habituation - Seated Horizontal Head Rotation  - 3 x daily - 7 x weekly - 1-2 sets - 10 reps - Seated Head Nods Vestibular Habituation  - 3 x daily - 7 x weekly - 1-2 sets - 10 reps GOALS: SHORT TERM GOALS: Target date: 02/11/2022  Patient will be independent with initial HEP and self-management strategies to improve functional outcomes Baseline:  Goal status: IN PROGRESS    LONG TERM GOALS: Target date: 02/25/2022  Patient will be independent with advanced HEP and self-management strategies to improve functional outcomes Baseline:  Goal status: IN PROGRESS  2.  Patient will improve FOTO score to predicted value to indicate improvement in  functional outcomes Baseline: 48% function (was 46%) Goal status: IN PROGRESS  3.  Patient will demo improved RT cervical rotation by at least 5 degrees in order to improve ability to scan environment for safety and while  driving. Baseline: 43 deg Goal status: IN PROGRESS  4. Patient will report a decrease in vertigo symptoms by at least 50% for improved quality of life, functional level with ADLs and reduced risk for falls.  Baseline: Reports 90% until today's episode Goal status: Partially MET ASSESSMENT:  CLINICAL IMPRESSION: Performed log roll testing with plausible result for LT side. Performed LT repositioning maneuver with good result. Progressed to standing habituation activity and static balance. Patient well challenged with both new activities and notes ongoing difficulty with standing balance, especially that involving head movement. Patient will continue to benefit from skilled therapy services to reduce remaining deficits and improve functional ability.     OBJECTIVE IMPAIRMENTS decreased balance, decreased ROM, and dizziness.   ACTIVITY LIMITATIONS standing, transfers, bed mobility, and locomotion level  PARTICIPATION LIMITATIONS: meal prep, cleaning, laundry, driving, shopping, community activity, and yard work  PERSONAL FACTORS Age are also affecting patient's functional outcome.   REHAB POTENTIAL: Good  CLINICAL DECISION MAKING: Stable/uncomplicated  EVALUATION COMPLEXITY: Low   PLAN: PT FREQUENCY: 1-2x/week  PT DURATION: 4 weeks  PLANNED INTERVENTIONS: Therapeutic exercises, Therapeutic activity, Neuromuscular re-education, Balance training, Gait training, Patient/Family education, Self Care, Joint mobilization, Stair training, Vestibular training, and Canalith repositioning  PLAN FOR NEXT SESSION: . Progress balance and habituation.   3:14 PM, 03/01/22 Josue Hector PT DPT  Physical Therapist with [facility]  [phone]

## 2022-03-04 ENCOUNTER — Ambulatory Visit (HOSPITAL_COMMUNITY): Payer: Medicare Other

## 2022-03-04 DIAGNOSIS — R42 Dizziness and giddiness: Secondary | ICD-10-CM | POA: Diagnosis not present

## 2022-03-04 NOTE — Therapy (Signed)
OUTPATIENT PHYSICAL THERAPY VESTIBULAR EVALUATION     Patient Name: Peggy French MRN: [id number] DOB:1940/12/14, 81 y.o., female Today's Date: 03/04/2022   See note below for Objective Data and Assessment of Progress/Goals.     PCP: Iona Beard MD REFERRING PROVIDER: Jolene Provost, PA-C    PT End of Session - 03/04/22 1315     Visit Number 5    Number of Visits 8    Date for PT Re-Evaluation 03/25/22    Authorization Type [employer] M[employer] (no VL)    PT Start Time 1310   late check in   PT Stop Time 1345    PT Time Calculation (min) 35 min    Activity Tolerance Patient tolerated treatment well    Behavior During Therapy [facility] for tasks assessed/performed               Past Medical History:  Diagnosis Date   Asthmatic bronchitis 2011   Colon carcinoma metastatic to lung ([place]) 03/27/2013   a. s/p right hemicolectomy/chemo 2013 with mets to the lung s/p LLL wedge resection 2014.   Diabetes mellitus    x over 10 yrs   GERD (gastroesophageal reflux disease)    Gout    H/O hiatal hernia    History of colon cancer    History of shingles    Hyperlipidemia    Hypertension    EKG 10/12 EPIC, chest- 1 view  6/13 EPIC    Last 2D Echo on 05/02/2012 showed EF of greater than 55%   Microcytic anemia    Mild CAD    a. cath by Dr. Debara Pickett in 03/2011 - 20-30% ostial bifurcation stenosis of LAD, otherwise only mild luminal irregularities in LAD/RCA, LVEF 60%, no RWMA.   c. 06/18/16 LHC after abnormal nuc showed mild non obst CAD   Neuropathy    Obesity    Past Surgical History:  Procedure Laterality Date   ABDOMINAL HYSTERECTOMY     APPENDECTOMY     CARDIAC CATHETERIZATION  2001,2012   CARDIAC CATHETERIZATION N/A 06/18/2016   Procedure: Left Heart Cath and Coronary Angiography;  Surgeon: Nelva Bush, MD;  Location: [facility];  Service: Cardiovascular;  Laterality: N/A;   CATARACT EXTRACTION W/PHACO Left 02/17/2015   Procedure: CATARACT EXTRACTION PHACO AND  INTRAOCULAR LENS PLACEMENT (IOC);  Surgeon: Tonny Branch, MD;  [facility];  Service: Ophthalmology;  Laterality: Left;  CDE:8.01    CATARACT EXTRACTION W/PHACO Right 08/03/2021   Procedure: CATARACT EXTRACTION PHACO AND INTRAOCULAR LENS PLACEMENT (IOC);  Surgeon: Baruch Goldmann, MD;  Location: [facility];  Service: Ophthalmology;  Laterality: Right;  CDE: 13.97   CHOLECYSTECTOMY     COLON SURGERY     PARTIAL COLECTOMY 30 YRS / AGAIN 2013   COLONOSCOPY  11/26/2011   Procedure: COLONOSCOPY;  Surgeon: Rogene Houston, MD;  [facility];  Service: Endoscopy;  Laterality: N/A;  2:15   COLONOSCOPY N/A 12/08/2012   Procedure: COLONOSCOPY;  Surgeon: Rogene Houston, MD;  [facility];  Service: Endoscopy;  Laterality: N/A;  815-moved to [place] to notify pt   COLONOSCOPY N/A 03/25/2016   Procedure: COLONOSCOPY;  Surgeon: Rogene Houston, MD;  [facility];  Service: Endoscopy;  Laterality: N/A;  1200   COLONOSCOPY WITH PROPOFOL N/A 07/29/2021   Procedure: COLONOSCOPY WITH PROPOFOL;  Surgeon: Rogene Houston, MD;  [facility];  Service: Endoscopy;  Laterality: N/A;  11:10   ESOPHAGOGASTRODUODENOSCOPY  06/16/2011   Procedure: ESOPHAGOGASTRODUODENOSCOPY (  EGD);  Surgeon: Rogene Houston, MD;  [facility];  Service: Endoscopy;  Laterality: N/A;  2:15   POLYPECTOMY  07/29/2021   Procedure: POLYPECTOMY;  Surgeon: Rogene Houston, MD;  [facility];  Service: Endoscopy;;  transverse colon   PORT-A-CATH REMOVAL N/A 06/04/2014   Procedure: REMOVAL PORT-A-CATH;  Surgeon: Pedro Earls, MD;  [facility];  Service: General;  Laterality: N/A;   PORTACATH PLACEMENT  02/22/2012   Procedure: INSERTION PORT-A-CATH;  Surgeon: Pedro Earls, MD;  [facility];  Service: General;  Laterality: N/A;  left subclavian   VIDEO ASSISTED THORACOSCOPY (VATS)/WEDGE RESECTION Left 03/27/2013   Procedure: VIDEO ASSISTED THORACOSCOPY (VATS)/WEDGE RESECTION;   Surgeon: Grace Isaac, MD;  Location: [place];  Service: Thoracic;  Laterality: Left;   VIDEO BRONCHOSCOPY N/A 03/27/2013   Procedure: VIDEO BRONCHOSCOPY;  Surgeon: Grace Isaac, MD;  Location: [facility];  Service: Thoracic;  Laterality: N/A;   Patient Active Problem List   Diagnosis Date Noted   Chest pain 09/29/2020   Moderate risk chest pain 06/24/2016   Mixed hyperlipidemia 06/24/2016   Unstable angina ([place]) 06/19/2016   Mild CAD    Chest pain with moderate risk of acute coronary syndrome 06/17/2015   Diabetes mellitus type 2 in obese ([place]) 06/17/2015   Obesity    Colon carcinoma metastatic to lung ([place]) 03/27/2013   S/P right colectomy 01/07/2012   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 No pain today. "About 60% better"  PERTINENT HISTORY: Cancer, DM   PAIN:  Are you having pain? Yes: NPRS scale: 0/10 Pain location: forehead Pain description: aching  Aggravating factors: unsure  Relieving factors: heat, hot shower    PRECAUTIONS: Fall  WEIGHT BEARING RESTRICTIONS No  FALLS: Has patient fallen in last 6 months? No  LIVING ENVIRONMENT: Lives with: lives with their family and lives with their spouse Lives in: House/apartment Stairs: Yes: External: 2 steps; can reach both Has following equipment at home: Single point cane  PLOF: Independent with basic ADLs  PATIENT GOALS "getting back into shape"   OBJECTIVE:     Cervical ROM:    Active A/PROM (deg) eval AROM 02/25/22   Flexion 30 32  Extension 18 16  Right lateral flexion    Left lateral flexion    Right rotation 50 43  Left rotation 55 48  (Blank rows = not tested)   PATIENT SURVEYS:  FOTO 48% function    VESTIBULAR ASSESSMENT     SYMPTOM BEHAVIOR:   Subjective history: See above   Non-Vestibular symptoms: headaches, tinnitus, and nausea/vomiting   Type of dizziness: Spinning/Vertigo   Frequency: daily   Duration: up to about 8 minutes    Aggravating factors: Induced by position change: supine to sit   Relieving factors: head stationary   Progression of symptoms: worse   OCULOMOTOR EXAM:   Ocular Alignment: normal   Ocular ROM: No Limitations   Smooth Pursuits: intact   MOTION SENSITIVITY:  Motion Sensitivity Quotient  Intensity: 0 = none, 1 = Lightheaded, 2 = Mild, 3 = Moderate, 4 = Severe, 5 = Vomiting  Intensity 8\9\23 02/25/22 03/04/22  1. Sitting to supine 2  0   2. Supine to L side 3  0   3. Supine to R side 3  0   4. Supine to sitting   3   5. L Hallpike-Dix 2 4 0   6. Up from L   4 3   7. R Hallpike-Dix 2 3 0   8. Up from R  _0 9. Sitting, head  tipped to L knee    1  10. Head up from L  knee    1  11. Sitting, head  tipped to R knee    1  12. Head up from R  knee    1  13. Sitting head turns x5 3   0  14.Sitting head nods x5 3   0  15. In stance, 180  turn to L       16. In stance, 180  turn to R          VESTIBULAR TREATMENT:  Canalith Repositioning:  8/9:  Lt Eply's maneuver completed x 2 reps.  Pt continues to have nystagmus.  Pt has significant rounded shoulder.  Attempted scapular retraction but this           Is very difficult for pt.  Attempted manual but pt is hyper sensitive.   Evaluation:  Comment: not completed today, Dixhall pike negative (dizzy for 5 seconds but no nystagmus)   TODAYS TREATMENT: 03/04/22 Seated head turns and nods x 10 Seated nose to knee x 5 each  Standing head turns and nods x 10 (mild  dizziness)  Standing: Rhomberg stance head turns and nods x 10 Tandems stance 30" each SLS right 10" then 6" SLS left 10" then 4"  Right dix hallpike positive so into Epley maneuver; most dizzy on return to sitting    03/01/22 Log roll test LT and RT (mild dizziness LT, no symptoms on RT)  LT horizontal repositioning maneuver   Seated head turns/ nods  x 10 each (no symptoms)  Standing head turns/ nods  x 10 (mild dizziness)  Tandem stance 2 x 30" each   02/25/22 Reassess FOTO AROM  Eplley bilateral (negative, but does note dizziness with supine to sit position bilateral, no nystagmus seen)  PATIENT EDUCATION: 02/17/22:   What BPPV is and why we complete the Eply's manuver, the importance of good posture to prevent BPPV.  Scapular retraction and self manual.  Education details: on eval findings, POC and HEP Person educated: Patient Education method: Explanation Education comprehension: verbalized understanding  HEP: Access Code: [email or web address] URL: [email or web address] Date: 03/04/2022 Prepared by: [facility] - Re[facility]  Exercises - Vestibular Habituation - Seated Horizontal Head Rotation  - 2-3 x daily - 7 x weekly - 1-2 sets - 10 reps - Seated Head Nods Vestibular Habituation  - 2-3 x daily - 7 x weekly - 1-2 sets - 10 reps - Romberg Stance with Head Rotation  - 2-3 x daily - 7 x weekly - 1-2 sets - 10 reps - Romberg Stance with Head Nods  - 2-3 x daily - 7 x weekly - 1-2 sets - 10 reps - Standing Tandem Balance with Counter Support  - 2-3 x daily - 7 x weekly - 1 sets - 3 reps - 20 second hold - Standing Single  Leg Stance with Counter Support  - 2-3 x daily - 7 x weekly - 1 sets - 3 reps - 10-20 sec hold - Seated Nose to Left Knee Vestibular Habituation  - 2-3 x daily - 7 x weekly - 1 sets - 5 reps - Seated Nose to Right Knee Vestibular Habituation  - 2-3 x daily - 7 x weekly - 1 sets - 5 reps   Access Code: [email or web address] URL:  [email or web address]  03/01/22 - Romberg Stance with Head Rotation  - 2-3 x daily - 7 x weekly - 1-2 sets - 10 reps - Romberg Stance with Head Nods  - 2-3 x daily - 7 x weekly - 1-2 sets - 10 reps - Standing Tandem Balance with Counter Support  - 2-3 x daily - 7 x weekly - 1 sets - 3 reps - 20 second hold  Date: 01/28/2022 Prepared by: Josue Hector  Exercises 8/9 :  scapular retraction.  - Vestibular Habituation - Seated Horizontal Head Rotation  - 3 x daily - 7 x weekly - 1-2 sets - 10 reps - Seated Head Nods Vestibular Habituation  - 3 x daily - 7 x weekly - 1-2 sets - 10 reps GOALS: SHORT TERM GOALS: Target date: 02/11/2022  Patient will be independent with initial HEP and self-management strategies to improve functional outcomes Baseline:  Goal status: IN PROGRESS    LONG TERM GOALS: Target date: 02/25/2022  Patient will be independent with advanced HEP and self-management strategies to improve functional outcomes Baseline:  Goal status: IN PROGRESS  2.  Patient will improve FOTO score to predicted value to indicate improvement in functional outcomes Baseline: 48% function (was 46%) Goal status: IN PROGRESS  3.  Patient will demo improved RT cervical rotation by at least 5 degrees in order to improve ability to scan environment for safety and while driving. Baseline: 43 deg Goal status: IN PROGRESS  4. Patient will report a decrease in vertigo symptoms by at least 50% for improved quality of life, functional level with ADLs and reduced risk for falls.  Baseline: Reports 90% until today's episode Goal status: Partially MET ASSESSMENT:  CLINICAL IMPRESSION:  Patient with overall decreased complaint of dizziness but positive dix hallpike to right so performed epley maneuver for right posterior canal; progressed habituation exercise.  Patient will continue to benefit from skilled therapy services to reduce remaining deficits and improve functional ability.      OBJECTIVE IMPAIRMENTS decreased balance, decreased ROM, and dizziness.   ACTIVITY LIMITATIONS standing, transfers, bed mobility, and locomotion level  PARTICIPATION LIMITATIONS: meal prep, cleaning, laundry, driving, shopping, community activity, and yard work  PERSONAL FACTORS Age are also affecting patient's functional outcome.   REHAB POTENTIAL: Good  CLINICAL DECISION MAKING: Stable/uncomplicated  EVALUATION COMPLEXITY: Low   PLAN: PT FREQUENCY: 1-2x/week  PT DURATION: 4 weeks  PLANNED INTERVENTIONS: Therapeutic exercises, Therapeutic activity, Neuromuscular re-education, Balance training, Gait training, Patient/Family education, Self Care, Joint mobilization, Stair training, Vestibular training, and Canalith repositioning  PLAN FOR NEXT SESSION: . Progress balance and habituation. Monitor Reaction to last treatment  1:53 PM, 03/04/22 Amy Small Lynch MPT [facility] [place] [phone]

## 2022-03-11 ENCOUNTER — Encounter (HOSPITAL_COMMUNITY): Payer: Self-pay | Admitting: Physical Therapy

## 2022-03-11 ENCOUNTER — Ambulatory Visit (HOSPITAL_COMMUNITY): Payer: Medicare Other | Admitting: Physical Therapy

## 2022-03-11 DIAGNOSIS — R42 Dizziness and giddiness: Secondary | ICD-10-CM

## 2022-03-11 NOTE — Therapy (Signed)
OUTPATIENT PHYSICAL THERAPY VESTIBULAR EVALUATION     Patient Name: Peggy French MRN: [id number] DOB:03/20/1941, 81 y.o., female Today's Date: 03/11/2022   See note below for Objective Data and Assessment of Progress/Goals.     PCP: Iona Beard MD REFERRING PROVIDER: Jolene Provost, PA-C    PT End of Session - 03/11/22 1520     Visit Number 6    Number of Visits 8    Date for PT Re-Evaluation 03/25/22    Authorization Type [employer] (no VL)    PT Start Time 1520    PT Stop Time 1558    PT Time Calculation (min) 38 min    Activity Tolerance Patient tolerated treatment well    Behavior During Therapy WFL for tasks assessed/performed               Past Medical History:  Diagnosis Date   Asthmatic bronchitis 2011   Colon carcinoma metastatic to lung ([place]) 03/27/2013   a. s/p right hemicolectomy/chemo 2013 with mets to the lung s/p LLL wedge resection 2014.   Diabetes mellitus    x over 10 yrs   GERD (gastroesophageal reflux disease)    Gout    H/O hiatal hernia    History of colon cancer    History of shingles    Hyperlipidemia    Hypertension    EKG 10/12 EPIC, chest- 1 view  6/13 EPIC    Last 2D Echo on 05/02/2012 showed EF of greater than 55%   Microcytic anemia    Mild CAD    a. cath by Dr. Debara Pickett in 03/2011 - 20-30% ostial bifurcation stenosis of LAD, otherwise only mild luminal irregularities in LAD/RCA, LVEF 60%, no RWMA.   c. 06/18/16 [facility] after abnormal nuc showed mild non obst CAD   Neuropathy    Obesity    Past Surgical History:  Procedure Laterality Date   ABDOMINAL HYSTERECTOMY     APPENDECTOMY     CARDIAC CATHETERIZATION  2001,2012   CARDIAC CATHETERIZATION N/A 06/18/2016   Procedure: Left Heart Cath and Coronary Angiography;  Surgeon: Nelva Bush, MD;  Location: [facility];  Service: Cardiovascular;  Laterality: N/A;   CATARACT EXTRACTION W/PHACO Left 02/17/2015   Procedure: CATARACT EXTRACTION PHACO AND INTRAOCULAR LENS  PLACEMENT (IOC);  Surgeon: Tonny Branch, MD;  Location: [facility];  Service: Ophthalmology;  Laterality: Left;  CDE:8.01    CATARACT EXTRACTION W/PHACO Right 08/03/2021   Procedure: CATARACT EXTRACTION PHACO AND INTRAOCULAR LENS PLACEMENT (IOC);  Surgeon: Baruch Goldmann, MD;  Location: [facility];  Service: Ophthalmology;  Laterality: Right;  CDE: 13.97   CHOLECYSTECTOMY     COLON SURGERY     PARTIAL COLECTOMY 30 YRS / AGAIN 2013   COLONOSCOPY  11/26/2011   Procedure: COLONOSCOPY;  Surgeon: Rogene Houston, MD;  [facility]: [facility];  Service: Endoscopy;  Laterality: N/A;  2:15   COLONOSCOPY N/A 12/08/2012   Procedure: COLONOSCOPY;  Surgeon: Rogene Houston, MD;  [facility];  Service: Endoscopy;  Laterality: N/A;  815-moved to [place] to notify pt   COLONOSCOPY N/A 03/25/2016   Procedure: COLONOSCOPY;  Surgeon: Rogene Houston, MD;  [facility];  Service: Endoscopy;  Laterality: N/A;  1200   COLONOSCOPY WITH PROPOFOL N/A 07/29/2021   Procedure: COLONOSCOPY WITH PROPOFOL;  Surgeon: Rogene Houston, MD;  [facility];  Service: Endoscopy;  Laterality: N/A;  11:10   ESOPHAGOGASTRODUODENOSCOPY  06/16/2011   Procedure: ESOPHAGOGASTRODUODENOSCOPY (EGD);  Surgeon: Bernadene Person  Gloriann Loan, MD;  [facility];  Service: Endoscopy;  Laterality: N/A;  2:15   POLYPECTOMY  07/29/2021   Procedure: POLYPECTOMY;  Surgeon: Rogene Houston, MD;  [facility];  Service: Endoscopy;;  transverse colon   PORT-A-CATH REMOVAL N/A 06/04/2014   Procedure: REMOVAL PORT-A-CATH;  Surgeon: Pedro Earls, MD;  Location: [facility];  Service: General;  Laterality: N/A;   PORTACATH PLACEMENT  02/22/2012   Procedure: INSERTION PORT-A-CATH;  Surgeon: Pedro Earls, MD;  Location: [facility];  Service: General;  Laterality: N/A;  left subclavian   VIDEO ASSISTED THORACOSCOPY (VATS)/WEDGE RESECTION Left 03/27/2013   Procedure: VIDEO ASSISTED THORACOSCOPY (VATS)/WEDGE RESECTION;  Surgeon: Grace Isaac, MD;  Location: [facility];  Service: Thoracic;  Laterality: Left;   VIDEO BRONCHOSCOPY N/A 03/27/2013   Procedure: VIDEO BRONCHOSCOPY;  Surgeon: Grace Isaac, MD;  Location: [facility];  Service: Thoracic;  Laterality: N/A;   Patient Active Problem List   Diagnosis Date Noted   Chest pain 09/29/2020   Moderate risk chest pain 06/24/2016   Mixed hyperlipidemia 06/24/2016   Unstable angina ([place]) 06/19/2016   Mild CAD    Chest pain with moderate risk of acute coronary syndrome 06/17/2015   Diabetes mellitus type 2 in obese ([place]) 06/17/2015   Obesity    Colon carcinoma metastatic to lung ([place]) 03/27/2013   S/P right colectomy 01/07/2012   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 Still having trouble with balance. Blood pressure is "where its supposed to be at".  PERTINENT HISTORY: Cancer, DM   PAIN:  Are you having pain? No    PRECAUTIONS: Fall  WEIGHT BEARING RESTRICTIONS No  FALLS: Has patient fallen in last 6 months? No  LIVING ENVIRONMENT: Lives with: lives with their family and lives with their spouse Lives in: House/apartment Stairs: Yes: External: 2 steps; can reach both Has following equipment at home: Single point cane  PLOF: Independent with basic ADLs  PATIENT GOALS "getting back into shape"   OBJECTIVE:     Cervical ROM:    Active A/PROM (deg) eval AROM 02/25/22  Flexion 30 32  Extension 18 16  Right lateral flexion    Left lateral flexion    Right rotation 50 43  Left rotation 55 48  (Blank rows = not tested)   PATIENT SURVEYS:  FOTO 48% function    VESTIBULAR ASSESSMENT     SYMPTOM  BEHAVIOR:   Subjective history: See above   Non-Vestibular symptoms: headaches, tinnitus, and nausea/vomiting   Type of dizziness: Spinning/Vertigo   Frequency: daily   Duration: up to about 8 minutes    Aggravating factors: Induced by position change: supine to sit   Relieving factors: head stationary   Progression of symptoms: worse   OCULOMOTOR EXAM:   Ocular Alignment: normal   Ocular ROM: No Limitations   Smooth Pursuits: intact   MOTION SENSITIVITY:    Motion Sensitivity Quotient  Intensity: 0 = none, 1 = Lightheaded, 2 = Mild, 3 = Moderate, 4 = Severe, 5 = Vomiting  Intensity 8\9\23 02/25/22 03/04/22  1. Sitting to supine 2  0   2. Supine to L  side 3  0   3. Supine to R side 3  0   4. Supine to sitting   3   5. L Hallpike-Dix 2 4 0   6. Up from L   4 3   7. R Hallpike-Dix 2 3 0   8. Up from R  _0 9. Sitting, head  tipped to L knee    1  10. Head up from L  knee    1  11. Sitting, head  tipped to R knee    1  12. Head up from R  knee    1  13. Sitting head turns x5 3   0  14.Sitting head nods x5 3   0  15. In stance, 180  turn to L       16. In stance, 180  turn to R          VESTIBULAR TREATMENT:  Canalith Repositioning:  8/9:  Lt Eply's maneuver completed x 2 reps.  Pt continues to have nystagmus.  Pt has significant rounded shoulder.  Attempted scapular retraction but this           Is very difficult for pt.  Attempted manual but pt is hyper sensitive.   Evaluation:  Comment: not completed today, Dixhall pike negative (dizzy for 5 seconds but no nystagmus)   TODAYS TREATMENT: 03/11/22 Dixhallpike (negative bilateral) but consistently dizzy when sitting up Orthostatic vitals: Seated 148/82 Post sit/ stand 178/82 Supine 178/84 Post supine to sit 158/84  Tandem stance 2 x 30"  NBOS with head turns/ nods x 10 each   03/04/22 Seated head turns and nods x 10 Seated nose to knee x 5 each  Standing head turns and nods x 10 (mild  dizziness)  Standing: Rhomberg stance head turns and nods x 10 Tandems stance 30" each SLS right 10" then 6" SLS left 10" then 4"  Right dix hallpike positive so into Epley maneuver; most dizzy on return to sitting    03/01/22 Log roll test LT and RT (mild dizziness LT, no symptoms on RT)  LT horizontal repositioning maneuver   Seated head turns/ nods  x 10 each (no symptoms)  Standing head turns/ nods  x 10 (mild dizziness)  Tandem stance 2 x 30" each   02/25/22 Reassess FOTO AROM  Eplley bilateral (negative, but does note dizziness with supine to sit position bilateral, no nystagmus seen)  PATIENT EDUCATION: 02/17/22:   What BPPV is and why we complete the Eply's manuver, the importance of good posture to prevent BPPV.  Scapular retraction and self manual.  Education details: on eval findings, POC and HEP Person educated: Patient Education method: Explanation Education comprehension: verbalized understanding  HEP: Access Code: [email or web address] URL: [email or web address] Date: 03/04/2022 Prepared by: [facility]  Exercises - Vestibular Habituation - Seated Horizontal Head Rotation  - 2-3 x daily - 7 x weekly - 1-2 sets - 10 reps - Seated Head Nods Vestibular Habituation  - 2-3 x daily - 7 x weekly - 1-2 sets - 10 reps - Romberg Stance with Head Rotation  - 2-3 x daily - 7 x weekly - 1-2 sets - 10 reps - Romberg Stance with Head Nods  - 2-3 x daily - 7 x weekly - 1-2 sets - 10 reps - Standing Tandem Balance with Counter Support  - 2-3 x daily - 7 x weekly - 1 sets - 3 reps - 20 second hold - Standing Single  Leg Stance with Counter Support  - 2-3 x daily - 7 x weekly - 1 sets - 3 reps - 10-20 sec hold - Seated Nose to Left Knee Vestibular Habituation  - 2-3 x daily - 7 x weekly - 1 sets - 5 reps - Seated Nose to Right Knee Vestibular Habituation  - 2-3 x daily - 7 x weekly - 1 sets - 5 reps   Access Code: [email or web address] URL:  [email or web address]  03/01/22 - Romberg Stance with Head Rotation  - 2-3 x daily - 7 x weekly - 1-2 sets - 10 reps - Romberg Stance with Head Nods  - 2-3 x daily - 7 x weekly - 1-2 sets - 10 reps - Standing Tandem Balance with Counter Support  - 2-3 x daily - 7 x weekly - 1 sets - 3 reps - 20 second hold  Date: 01/28/2022 Prepared by: Josue Hector  Exercises 8/9 :  scapular retraction.  - Vestibular Habituation - Seated Horizontal Head Rotation  - 3 x daily - 7 x weekly - 1-2 sets - 10 reps - Seated Head Nods Vestibular Habituation  - 3 x daily - 7 x weekly - 1-2 sets - 10 reps GOALS: SHORT TERM GOALS: Target date: 02/11/2022  Patient will be independent with initial HEP and self-management strategies to improve functional outcomes Baseline:  Goal status: IN PROGRESS    LONG TERM GOALS: Target date: 02/25/2022  Patient will be independent with advanced HEP and self-management strategies to improve functional outcomes Baseline:  Goal status: IN PROGRESS  2.  Patient will improve FOTO score to predicted value to indicate improvement in functional outcomes Baseline: 48% function (was 46%) Goal status: IN PROGRESS  3.  Patient will demo improved RT cervical rotation by at least 5 degrees in order to improve ability to scan environment for safety and while driving. Baseline: 43 deg Goal status: IN PROGRESS  4. Patient will report a decrease in vertigo symptoms by at least 50% for improved quality of life, functional level with ADLs and reduced risk for falls.  Baseline: Reports 90% until today's episode Goal status: Partially MET ASSESSMENT:  CLINICAL IMPRESSION:  Dixhallpike tested bilaterally was negative, but patient reports consistent dizziness with sitting up from supine position. Assessed orthostatic vitals and noted increased BP with sit/stand and also 20 point drop in BP with sitting from supine position, which may be contributing factor to ongoing symptoms  reported. Patient shows improving static balance, and no reports of dizziness following treatment today. Patient will continue to benefit from skilled therapy services to reduce remaining deficits and improve functional ability.   OBJECTIVE IMPAIRMENTS decreased balance, decreased ROM, and dizziness.   ACTIVITY LIMITATIONS standing, transfers, bed mobility, and locomotion level  PARTICIPATION LIMITATIONS: meal prep, cleaning, laundry, driving, shopping, community activity, and yard work  PERSONAL FACTORS Age are also affecting patient's functional outcome.   REHAB POTENTIAL: Good  CLINICAL DECISION MAKING: Stable/uncomplicated  EVALUATION COMPLEXITY: Low   PLAN: PT FREQUENCY: 1-2x/week  PT DURATION: 4 weeks  PLANNED INTERVENTIONS: Therapeutic exercises, Therapeutic activity, Neuromuscular re-education, Balance training, Gait training, Patient/Family education, Self Care, Joint mobilization, Stair training, Vestibular training, and Canalith repositioning  PLAN FOR NEXT SESSION: . Progress balance and habituation. Monitor vitals with activity. Proceed as indicated.   4:01 PM, 03/11/22 Josue Hector PT DPT  Physical Therapist with [facility]  [phone]

## 2022-03-17 ENCOUNTER — Ambulatory Visit (HOSPITAL_COMMUNITY): Payer: Medicare Other | Attending: Physician Assistant | Admitting: Physical Therapy

## 2022-03-17 ENCOUNTER — Encounter (HOSPITAL_COMMUNITY): Payer: Self-pay | Admitting: Physical Therapy

## 2022-03-17 DIAGNOSIS — R42 Dizziness and giddiness: Secondary | ICD-10-CM | POA: Insufficient documentation

## 2022-03-17 NOTE — Therapy (Signed)
OUTPATIENT PHYSICAL THERAPY VESTIBULAR EVALUATION     Patient Name: Peggy French MRN: [id number] DOB:Mar 25, 1941, 81 y.o., female Today's Date: 03/17/2022   See note below for Objective Data and Assessment of Progress/Goals.     PCP: Iona Beard MD REFERRING PROVIDER: Jolene Provost, PA-C    PT End of Session - 03/17/22 1042     Visit Number 7    Number of Visits 8    Date for PT Re-Evaluation 03/25/22    Authorization Type [employer] (no VL)    PT Start Time 1040    PT Stop Time 1110    PT Time Calculation (min) 30 min    Activity Tolerance Patient tolerated treatment well    Behavior During Therapy [facility] for tasks assessed/performed               Past Medical History:  Diagnosis Date   Asthmatic bronchitis 2011   Colon carcinoma metastatic to lung ([place]) 03/27/2013   a. s/p right hemicolectomy/chemo 2013 with mets to the lung s/p LLL wedge resection 2014.   Diabetes mellitus    x over 10 yrs   GERD (gastroesophageal reflux disease)    Gout    H/O hiatal hernia    History of colon cancer    History of shingles    Hyperlipidemia    Hypertension    EKG 10/12 EPIC, chest- 1 view  6/13 EPIC    Last 2D Echo on 05/02/2012 showed EF of greater than 55%   Microcytic anemia    Mild CAD    a. cath by Dr. Debara Pickett in 03/2011 - 20-30% ostial bifurcation stenosis of LAD, otherwise only mild luminal irregularities in LAD/RCA, LVEF 60%, no RWMA.   c. 06/18/16 LHC after abnormal nuc showed mild non obst CAD   Neuropathy    Obesity    Past Surgical History:  Procedure Laterality Date   ABDOMINAL HYSTERECTOMY     APPENDECTOMY     CARDIAC CATHETERIZATION  2001,2012   CARDIAC CATHETERIZATION N/A 06/18/2016   Procedure: Left Heart Cath and Coronary Angiography;  Surgeon: Nelva Bush, MD;  Location: [place] [facility];  Service: Cardiovascular;  Laterality: N/A;   CATARACT EXTRACTION W/PHACO Left 02/17/2015   Procedure: CATARACT EXTRACTION PHACO AND INTRAOCULAR LENS  PLACEMENT (IOC);  Surgeon: Tonny Branch, MD;  Location: [facility];  Service: Ophthalmology;  Laterality: Left;  CDE:8.01    CATARACT EXTRACTION W/PHACO Right 08/03/2021   Procedure: CATARACT EXTRACTION PHACO AND INTRAOCULAR LENS PLACEMENT (IOC);  Surgeon: Baruch Goldmann, MD;  Location: [facility];  Service: Ophthalmology;  Laterality: Right;  CDE: 13.97   CHOLECYSTECTOMY     COLON SURGERY     PARTIAL COLECTOMY 30 YRS / AGAIN 2013   COLONOSCOPY  11/26/2011   Procedure: COLONOSCOPY;  Surgeon: Rogene Houston, MD;  [facility];  Service: Endoscopy;  Laterality: N/A;  2:15   COLONOSCOPY N/A 12/08/2012   Procedure: COLONOSCOPY;  Surgeon: Rogene Houston, MD;  [facility];  Service: Endoscopy;  Laterality: N/A;  815-moved to [place] to notify pt   COLONOSCOPY N/A 03/25/2016   Procedure: COLONOSCOPY;  Surgeon: Rogene Houston, MD;  [facility];  Service: Endoscopy;  Laterality: N/A;  1200   COLONOSCOPY WITH PROPOFOL N/A 07/29/2021   Procedure: COLONOSCOPY WITH PROPOFOL;  Surgeon: Rogene Houston, MD;  [facility];  Service: Endoscopy;  Laterality: N/A;  11:10   ESOPHAGOGASTRODUODENOSCOPY  06/16/2011   Procedure: ESOPHAGOGASTRODUODENOSCOPY (EGD);  Surgeon: Bernadene Person  Gloriann Loan, MD;  [facility];  Service: Endoscopy;  Laterality: N/A;  2:15   POLYPECTOMY  07/29/2021   Procedure: POLYPECTOMY;  Surgeon: Rogene Houston, MD;  [facility];  Service: Endoscopy;;  transverse colon   PORT-A-CATH REMOVAL N/A 06/04/2014   Procedure: REMOVAL PORT-A-CATH;  Surgeon: Pedro Earls, MD;  Location: [facility];  Service: General;  Laterality: N/A;   PORTACATH PLACEMENT  02/22/2012   Procedure: INSERTION PORT-A-CATH;  Surgeon: Pedro Earls, MD;  Location: [facility];  Service: General;  Laterality: N/A;  left subclavian   VIDEO ASSISTED THORACOSCOPY (VATS)/WEDGE RESECTION Left 03/27/2013   Procedure: VIDEO ASSISTED THORACOSCOPY (VATS)/WEDGE RESECTION;  Surgeon: Grace Isaac, MD;  Location: [place];  Service: Thoracic;  Laterality: Left;   VIDEO BRONCHOSCOPY N/A 03/27/2013   Procedure: VIDEO BRONCHOSCOPY;  Surgeon: Grace Isaac, MD;  Location: [facility];  Service: Thoracic;  Laterality: N/A;   Patient Active Problem List   Diagnosis Date Noted   Chest pain 09/29/2020   Moderate risk chest pain 06/24/2016   Mixed hyperlipidemia 06/24/2016   Unstable angina ([place]) 06/19/2016   Mild CAD    Chest pain with moderate risk of acute coronary syndrome 06/17/2015   Diabetes mellitus type 2 in obese ([place]) 06/17/2015   Obesity    Colon carcinoma metastatic to lung ([place]) 03/27/2013   S/P right colectomy 01/07/2012   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 Still dizzy when sitting up for a few seconds. She sits still and it goes away. She is walking better, not as dizzy when moving around and walking now.   PERTINENT HISTORY: Cancer, DM  PAIN:  Are you having pain? No  PRECAUTIONS: Fall  WEIGHT BEARING RESTRICTIONS No  FALLS: Has patient fallen in last 6 months? No  LIVING ENVIRONMENT: Lives with: lives with their family and lives with their spouse Lives in: House/apartment Stairs: Yes: External: 2 steps; can reach both Has following equipment at home: Single point cane  PLOF: Independent with basic ADLs  PATIENT GOALS "getting back into shape"   OBJECTIVE:     Cervical ROM:    Active A/PROM (deg) eval AROM 02/25/22  Flexion 30 32  Extension 18 16  Right lateral flexion    Left lateral flexion    Right rotation 50 43  Left rotation 55 48  (Blank rows = not  tested)   PATIENT SURVEYS:  FOTO 48% function    VESTIBULAR ASSESSMENT     SYMPTOM BEHAVIOR:   Subjective history: See above   Non-Vestibular symptoms: headaches, tinnitus, and nausea/vomiting   Type of dizziness: Spinning/Vertigo   Frequency: daily   Duration: up to about 8 minutes    Aggravating factors: Induced by position change: supine to sit   Relieving factors: head stationary   Progression of symptoms: worse   OCULOMOTOR EXAM:   Ocular Alignment: normal   Ocular ROM: No Limitations   Smooth Pursuits: intact   MOTION SENSITIVITY:    Motion Sensitivity Quotient  Intensity: 0 = none, 1 = Lightheaded, 2 = Mild, 3 = Moderate, 4 = Severe, 5 = Vomiting  Intensity 8\9\23 02/25/22 03/04/22  1. Sitting to supine 2  0   2. Supine to L side 3  0   3. Supine to R side 3  0   4. Supine to sitting   3   5. L Hallpike-Dix 2 4 0   6. Up from L   4 3   7. R Hallpike-Dix 2 3 0   8. Up from R  _0 9. Sitting, head  tipped to L knee    1  10. Head up from L  knee    1  11. Sitting, head  tipped to R knee    1  12. Head up from R  knee    1  13. Sitting head turns x5 3   0  14.Sitting head nods x5 3   0  15. In stance, 180  turn to L       16. In stance, 180  turn to R          VESTIBULAR TREATMENT:  Canalith Repositioning:  8/9:  Lt Eply's maneuver completed x 2 reps.  Pt continues to have nystagmus.  Pt has significant rounded shoulder.  Attempted scapular retraction but this           Is very difficult for pt.  Attempted manual but pt is hyper sensitive.   Evaluation:  Comment: not completed today, Dixhall pike negative (dizzy for 5 seconds but no nystagmus)   TODAYS TREATMENT: 03/17/22 VOR 2 x 10 horiz/ vertical each  Tandem stance 2 x 30" NBOS with Head turns/ nods x10 each  Sidestepping 3 RT at [employer] Daroff x 3 each   03/11/22 Dixhallpike (negative bilateral) but consistently dizzy when sitting up Orthostatic vitals: Seated 148/82 Post sit/  stand 178/82 Supine 178/84 Post supine to sit 158/84  Tandem stance 2 x 30"  NBOS with head turns/ nods x 10 each   03/04/22 Seated head turns and nods x 10 Seated nose to knee x 5 each  Standing head turns and nods x 10 (mild dizziness)  Standing: Rhomberg stance head turns and nods x 10 Tandems stance 30" each SLS right 10" then 6" SLS left 10" then 4"  Right dix hallpike positive so into Epley maneuver; most dizzy on return to sitting    03/01/22 Log roll test LT and RT (mild dizziness LT, no symptoms on RT)  LT horizontal repositioning maneuver   Seated head turns/ nods  x 10 each (no symptoms)  Standing head turns/ nods  x 10 (mild dizziness)  Tandem stance 2 x 30" each   02/25/22 Reassess FOTO AROM  Eplley bilateral (negative, but does note dizziness with supine to sit position bilateral, no nystagmus seen)  PATIENT EDUCATION: 02/17/22:   What BPPV is and why we complete the Eply's manuver, the importance of good posture to prevent BPPV.  Scapular retraction and self manual.  Education details: on eval findings, POC and HEP Person educated: Patient Education method: Explanation Education comprehension: verbalized understanding  HEP: Access Code: [email or web address] URL: [email or web address] 03/17/22 - Brandt-Daroff Vestibular Exercise  - 2-3 x daily - 7 x weekly - 1 sets - 3-5 reps - 5 seconds hold  Date: 03/04/2022 Prepared by: [facility] - Rehab  Exercises - Vestibular Habituation - Seated Horizontal Head Rotation  - 2-3 x daily - 7 x weekly - 1-2 sets - 10 reps - Seated Head Nods Vestibular Habituation  - 2-3 x daily - 7  x weekly - 1-2 sets - 10 reps - Romberg Stance with Head Rotation  - 2-3 x daily - 7 x weekly - 1-2 sets - 10 reps - Romberg Stance with Head Nods  - 2-3 x daily - 7 x weekly - 1-2 sets - 10 reps - Standing Tandem Balance with Counter Support  - 2-3 x daily - 7 x weekly - 1 sets - 3 reps - 20 second hold - Standing Single Leg Stance with  Counter Support  - 2-3 x daily - 7 x weekly - 1 sets - 3 reps - 10-20 sec hold - Seated Nose to Left Knee Vestibular Habituation  - 2-3 x daily - 7 x weekly - 1 sets - 5 reps - Seated Nose to Right Knee Vestibular Habituation  - 2-3 x daily - 7 x weekly - 1 sets - 5 reps   Access Code: [email or web address] URL: [email or web address]  03/01/22 - Romberg Stance with Head Rotation  - 2-3 x daily - 7 x weekly - 1-2 sets - 10 reps - Romberg Stance with Head Nods  - 2-3 x daily - 7 x weekly - 1-2 sets - 10 reps - Standing Tandem Balance with Counter Support  - 2-3 x daily - 7 x weekly - 1 sets - 3 reps - 20 second hold  Date: 01/28/2022 Prepared by: Josue Hector  Exercises 8/9 :  scapular retraction.  - Vestibular Habituation - Seated Horizontal Head Rotation  - 3 x daily - 7 x weekly - 1-2 sets - 10 reps - Seated Head Nods Vestibular Habituation  - 3 x daily - 7 x weekly - 1-2 sets - 10 reps GOALS: SHORT TERM GOALS: Target date: 02/11/2022  Patient will be independent with initial HEP and self-management strategies to improve functional outcomes Baseline:  Goal status: IN PROGRESS    LONG TERM GOALS: Target date: 02/25/2022  Patient will be independent with advanced HEP and self-management strategies to improve functional outcomes Baseline:  Goal status: IN PROGRESS  2.  Patient will improve FOTO score to predicted value to indicate improvement in functional outcomes Baseline: 48% function (was 46%) Goal status: IN PROGRESS  3.  Patient will demo improved RT cervical rotation by at least 5 degrees in order to improve ability to scan environment for safety and while driving. Baseline: 43 deg Goal status: IN PROGRESS  4. Patient will report a decrease in vertigo symptoms by at least 50% for improved quality of life, functional level with ADLs and reduced risk for falls.  Baseline: Reports 90% until today's episode Goal status: Partially MET ASSESSMENT:  CLINICAL  IMPRESSION: Patient showing overall improvement. Improving static balance. Some difficulty with initial reps of VOR, but improving with practice. Added brandt Daroff for supine to sit habituation, with improved tolerance with each subsequent rep. Updated HEP. Reassess next visit and potential DC if goals MET.   OBJECTIVE IMPAIRMENTS decreased balance, decreased ROM, and dizziness.   ACTIVITY LIMITATIONS standing, transfers, bed mobility, and locomotion level  PARTICIPATION LIMITATIONS: meal prep, cleaning, laundry, driving, shopping, community activity, and yard work  PERSONAL FACTORS Age are also affecting patient's functional outcome.   REHAB POTENTIAL: Good  CLINICAL DECISION MAKING: Stable/uncomplicated  EVALUATION COMPLEXITY: Low   PLAN: PT FREQUENCY: 1-2x/week  PT DURATION: 4 weeks  PLANNED INTERVENTIONS: Therapeutic exercises, Therapeutic activity, Neuromuscular re-education, Balance training, Gait training, Patient/Family education, Self Care, Joint mobilization, Stair training, Vestibular training, and Canalith repositioning  PLAN FOR NEXT SESSION: Reassess next visit and potential DC  if goals MET.   10:42 AM, 03/17/22 Josue Hector PT DPT  Physical Therapist with [facility]  [phone]

## 2022-03-23 ENCOUNTER — Ambulatory Visit (HOSPITAL_COMMUNITY): Payer: Medicare Other

## 2022-03-30 ENCOUNTER — Ambulatory Visit (HOSPITAL_COMMUNITY): Payer: Medicare Other | Admitting: Physical Therapy

## 2022-03-30 DIAGNOSIS — R42 Dizziness and giddiness: Secondary | ICD-10-CM | POA: Diagnosis not present

## 2022-03-30 NOTE — Therapy (Signed)
OUTPATIENT PHYSICAL THERAPY VESTIBULAR EVALUATION     Patient Name: Peggy French MRN: [id number] DOB:08/24/1940, 80 y.o., female Today's Date: 03/30/2022  PHYSICAL THERAPY DISCHARGE SUMMARY  Visits from Start of Care: 8  Current functional level related to goals / functional outcomes: See below   Remaining deficits: See below   Education / Equipment: See assessment   Patient agrees to discharge. Patient goals were partially met. Patient is being discharged due to being pleased with the current functional level.  See note below for Objective Data and Assessment of Progress/Goals.     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 s/p right hemicolectomy/chemo 2013 with mets to the lung s/p LLL wedge resection 2014.   Diabetes mellitus    x over 10 yrs   GERD (gastroesophageal reflux disease)    Gout    H/O hiatal hernia    History of colon cancer    History of shingles    Hyperlipidemia    Hypertension    EKG 10/12 EPIC, chest- 1 view  6/13 EPIC    Last 2D Echo on 05/02/2012 showed EF of greater than 55%   Microcytic anemia    Mild CAD    a. cath by Dr. Hilty in 03/2011 - 20-30% ostial bifurcation stenosis of LAD, otherwise only mild luminal irregularities in LAD/RCA, LVEF 60%, no RWMA.   c. 06/18/16 LHC after abnormal nuc showed mild non obst CAD   Neuropathy    Obesity    Past Surgical History:  Procedure Laterality Date   ABDOMINAL  HYSTERECTOMY     APPENDECTOMY     CARDIAC CATHETERIZATION  2001,2012   CARDIAC CATHETERIZATION N/A 06/18/2016   Procedure: Left Heart Cath and Coronary Angiography;  Surgeon: Christopher End, MD;  Location: [facility];  Service: Cardiovascular;  Laterality: N/A;   CATARACT EXTRACTION W/PHACO Left 02/17/2015   Procedure: CATARACT EXTRACTION PHACO AND INTRAOCULAR LENS PLACEMENT (IOC);  Surgeon: Kerry Hunt, MD;  Location: [facility];  Service: Ophthalmology;  Laterality: Left;  CDE:8.01    CATARACT EXTRACTION W/PHACO Right 08/03/2021   Procedure: CATARACT EXTRACTION PHACO AND INTRAOCULAR LENS PLACEMENT (IOC);  Surgeon: Wrzosek, James, MD;  Location: [facility];  Service: Ophthalmology;  Laterality: Right;  CDE: 13.97   CHOLECYSTECTOMY     COLON SURGERY     PARTIAL COLECTOMY 30 YRS / AGAIN 2013   COLONOSCOPY  11/26/2011   Procedure: COLONOSCOPY;  Surgeon: Najeeb U Rehman, MD;  Location: [facility];  Service: Endoscopy;  Laterality: N/A;  2:15   COLONOSCOPY N/A 12/08/2012   Procedure: COLONOSCOPY;  Surgeon: Najeeb U Rehman, MD;  Location: [facility];  Service: Endoscopy;  Laterality: N/A;  815-moved to 0950 Ann to notify pt   COLONOSCOPY N/A 03/25/2016   Procedure: COLONOSCOPY;  Surgeon: Najeeb U   Laural Golden, MD;  Location: [facility];  Service: Endoscopy;  Laterality: N/A;  1200   COLONOSCOPY WITH PROPOFOL N/A 07/29/2021   Procedure: COLONOSCOPY WITH PROPOFOL;  Surgeon: Rogene Houston, MD;  Location: [facility];  Service: Endoscopy;  Laterality: N/A;  11:10   ESOPHAGOGASTRODUODENOSCOPY  06/16/2011   Procedure: ESOPHAGOGASTRODUODENOSCOPY (EGD);  Surgeon: Rogene Houston, MD;  [facility]: [facility];  Service: Endoscopy;  Laterality: N/A;  2:15   POLYPECTOMY  07/29/2021   Procedure: POLYPECTOMY;  Surgeon: Rogene Houston, MD;  Location: [facility];  Service: Endoscopy;;  transverse colon   PORT-A-CATH REMOVAL N/A 06/04/2014   Procedure: REMOVAL PORT-A-CATH;  Surgeon: Pedro Earls, MD;   Location: [facility];  Service: General;  Laterality: N/A;   PORTACATH PLACEMENT  02/22/2012   Procedure: INSERTION PORT-A-CATH;  Surgeon: Pedro Earls, MD;  Location: [facility];  Service: General;  Laterality: N/A;  left subclavian   VIDEO ASSISTED THORACOSCOPY (VATS)/WEDGE RESECTION Left 03/27/2013   Procedure: VIDEO ASSISTED THORACOSCOPY (VATS)/WEDGE RESECTION;  Surgeon: Grace Isaac, MD;  Location: [place];  Service: Thoracic;  Laterality: Left;   VIDEO BRONCHOSCOPY N/A 03/27/2013   Procedure: VIDEO BRONCHOSCOPY;  Surgeon: Grace Isaac, MD;  Location: [facility] OR;  Service: Thoracic;  Laterality: N/A;   Patient Active Problem List   Diagnosis Date Noted   Chest pain 09/29/2020   Moderate risk chest pain 06/24/2016   Mixed hyperlipidemia 06/24/2016   Unstable angina ([place]) 06/19/2016   Mild CAD    Chest pain with moderate risk of acute coronary syndrome 06/17/2015   Diabetes mellitus type 2 in obese ([place]) 06/17/2015   Obesity    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 Feels that vertigo and dizziness is nearly 100% resolved "except for what I have going with my blood pressure". She is following up with cardio on this topic in 2 weeks. She feels the inner ear crystals are back in place and that vertigo episodes are "nothing like before".   PERTINENT HISTORY: Cancer, DM  PAIN:  Are you having pain? No  PRECAUTIONS: Fall  WEIGHT BEARING RESTRICTIONS No  FALLS: Has patient fallen in last 6 months? No  LIVING ENVIRONMENT: Lives with: lives with their family and  lives with their spouse Lives in: House/apartment Stairs: Yes: External: 2 steps; can reach both Has following equipment at home: Single point cane  PLOF: Independent with basic ADLs  PATIENT GOALS "getting back into shape"   OBJECTIVE:     Cervical ROM:    Active A/PROM (deg) eval AROM 02/25/22 AROM 03/30/22  Flexion 30 32 30  Extension _0 Right lateral flexion     Left lateral flexion     Right rotation 50 43 46  Left rotation 55 48 53  (Blank rows = not tested)   PATIENT SURVEYS:  FOTO 52% (was 48% function)    VESTIBULAR ASSESSMENT     SYMPTOM BEHAVIOR:   Subjective history: See above   Non-Vestibular symptoms: headaches, tinnitus, and nausea/vomiting   Type of dizziness: Spinning/Vertigo  Frequency: daily   Duration: up to about 8 minutes    Aggravating factors: Induced by position change: supine to sit   Relieving factors: head stationary   Progression of symptoms: worse   OCULOMOTOR EXAM:   Ocular Alignment: normal   Ocular ROM: No Limitations   Smooth Pursuits: intact   MOTION SENSITIVITY:    Motion Sensitivity Quotient  Intensity: 0 = none, 1 = Lightheaded, 2 = Mild, 3 = Moderate, 4 = Severe, 5 = Vomiting  Intensity 8\9\23 02/25/22 03/04/22 03/29/22  1. Sitting to supine 2  0    2. Supine to L side 3  0  1  3. Supine to R side 3  0  0  4. Supine to sitting   3  1  5. L Hallpike-Dix 2 4 0    6. Up from L   4 3  0  7. R Hallpike-Dix 2 3 0    8. Up from R  3 3 3  0  9. Sitting, head  tipped to L knee    1 0  10. Head up from L  knee    1 0  11. Sitting, head  tipped to R knee    1 0  12. Head up from R  knee    1 0  13. Sitting head turns x5 3   0 0  14.Sitting head nods x5 3   0 0  15. In stance, 180  turn to L        16. In stance, 180  turn to R           VESTIBULAR TREATMENT:  Canalith Repositioning:  8/9:  Lt Eply's maneuver completed x 2 reps.  Pt continues to have nystagmus.  Pt has significant rounded shoulder.   Attempted scapular retraction but this           Is very difficult for pt.  Attempted manual but pt is hyper sensitive.   Evaluation:  Comment: not completed today, Dixhall pike negative (dizzy for 5 seconds but no nystagmus)   TODAYS TREATMENT: 03/29/22 Reassess FOTO AROM HEP review   03/17/22 VOR 2 x 10 horiz/ vertical each  Tandem stance 2 x 30" NBOS with Head turns/ nods x10 each  Sidestepping 3 RT at mat   Brandt Daroff x 3 each   03/11/22 Dixhallpike (negative bilateral) but consistently dizzy when sitting up Orthostatic vitals: Seated 148/82 Post sit/ stand 178/82 Supine 178/84 Post supine to sit 158/84  Tandem stance 2 x 30"  NBOS with head turns/ nods x 10 each    PATIENT EDUCATION: 02/17/22:   What BPPV is and why we complete the Eply's manuver, the importance of good posture to prevent BPPV.  Scapular retraction and self manual.  Education details: on eval findings, POC and HEP Person educated: Patient Education method: Explanation Education comprehension: verbalized understanding  HEP: Access Code: [email or web address] URL: [email or web address] 03/17/22 - Brandt-Daroff Vestibular Exercise  - 2-3 x daily - 7 x weekly - 1 sets - 3-5 reps - 5 seconds hold  Date: 03/04/2022 Prepared by: [facility] - Rehab  Exercises - Vestibular Habituation - Seated Horizontal Head Rotation  - 2-3 x daily - 7 x weekly - 1-2 sets - 10 reps - Seated Head Nods Vestibular Habituation  - 2-3 x daily - 7 x weekly - 1-2 sets - 10 reps - Romberg Stance with Head Rotation  - 2-3 x daily - 7 x weekly - 1-2 sets - 10   reps - Romberg Stance with Head Nods  - 2-3 x daily - 7 x weekly - 1-2 sets - 10 reps - Standing Tandem Balance with Counter Support  - 2-3 x daily - 7 x weekly - 1 sets - 3 reps - 20 second hold - Standing Single Leg Stance with Counter Support  - 2-3 x daily - 7 x weekly - 1 sets - 3 reps - 10-20 sec hold - Seated Nose to Left Knee Vestibular Habituation  - 2-3 x daily - 7 x  weekly - 1 sets - 5 reps - Seated Nose to Right Knee Vestibular Habituation  - 2-3 x daily - 7 x weekly - 1 sets - 5 reps   Access Code: [email or web address] URL: [email or web address]  03/01/22 - Romberg Stance with Head Rotation  - 2-3 x daily - 7 x weekly - 1-2 sets - 10 reps - Romberg Stance with Head Nods  - 2-3 x daily - 7 x weekly - 1-2 sets - 10 reps - Standing Tandem Balance with Counter Support  - 2-3 x daily - 7 x weekly - 1 sets - 3 reps - 20 second hold  Date: 01/28/2022 Prepared by: Josue Hector  Exercises 8/9 :  scapular retraction.  - Vestibular Habituation - Seated Horizontal Head Rotation  - 3 x daily - 7 x weekly - 1-2 sets - 10 reps - Seated Head Nods Vestibular Habituation  - 3 x daily - 7 x weekly - 1-2 sets - 10 reps GOALS:   SHORT TERM GOALS: Target date: 02/11/2022  Patient will be independent with initial HEP and self-management strategies to improve functional outcomes Baseline:  Goal status: MET   LONG TERM GOALS: Target date: 02/25/2022  Patient will be independent with advanced HEP and self-management strategies to improve functional outcomes Baseline:  Goal status: MET  2.  Patient will improve FOTO score to predicted value to indicate improvement in functional outcomes Baseline: 52% function (was 46%) Goal status: Partially MET (within 5%)  3.  Patient will demo improved RT cervical rotation by at least 5 degrees in order to improve ability to scan environment for safety and while driving. Baseline: 46 deg Goal status: Not MET  4. Patient will report a decrease in vertigo symptoms by at least 50% for improved quality of life, functional level with ADLs and reduced risk for falls.  Baseline: "as close to 100% as I can be" Goal status: MET  ASSESSMENT:  CLINICAL IMPRESSION: Patient shows good objective improvement. Improved functional mobility and transfers, improved score with testing positional sensitivities. Significant Improvement in  subjective reports despite FOTO score. Reviewed progress, and HEP. Will DC today with goals met/ partially met. Answered all patient questions. Encouraged patient to follow up with therapy services with any further questions or concerns.    OBJECTIVE IMPAIRMENTS decreased balance, decreased ROM, and dizziness.   ACTIVITY LIMITATIONS standing, transfers, bed mobility, and locomotion level  PARTICIPATION LIMITATIONS: meal prep, cleaning, laundry, driving, shopping, community activity, and yard work  PERSONAL FACTORS Age are also affecting patient's functional outcome.   REHAB POTENTIAL: Good  CLINICAL DECISION MAKING: Stable/uncomplicated  EVALUATION COMPLEXITY: Low   PLAN: PT FREQUENCY: 1-2x/week  PT DURATION: 4 weeks  PLANNED INTERVENTIONS: Therapeutic exercises, Therapeutic activity, Neuromuscular re-education, Balance training, Gait training, Patient/Family education, Self Care, Joint mobilization, Stair training, Vestibular training, and Canalith repositioning  PLAN FOR NEXT SESSION: DC to HEP  11:25 AM, 03/30/22 Josue Hector PT DPT  Physical Therapist with [facility]  [facility]  [phone]

## 2022-04-28 ENCOUNTER — Ambulatory Visit: Payer: Medicare Other | Admitting: Podiatry

## 2022-04-28 ENCOUNTER — Encounter: Payer: Self-pay | Admitting: Podiatry

## 2022-04-28 DIAGNOSIS — B351 Tinea unguium: Secondary | ICD-10-CM

## 2022-04-28 DIAGNOSIS — M79675 Pain in left toe(s): Secondary | ICD-10-CM

## 2022-04-28 DIAGNOSIS — E1169 Type 2 diabetes mellitus with other specified complication: Secondary | ICD-10-CM | POA: Diagnosis not present

## 2022-04-28 DIAGNOSIS — M79674 Pain in right toe(s): Secondary | ICD-10-CM | POA: Diagnosis not present

## 2022-04-28 DIAGNOSIS — E669 Obesity, unspecified: Secondary | ICD-10-CM

## 2022-04-28 NOTE — Progress Notes (Signed)
This patient returns to my office for at risk foot care.  This patient requires this care by a professional since this patient will be at risk due to having diabetic neuropathy.This patient is unable to cut nails herself since the patient cannot reach her nails.These nails are painful walking and wearing shoes.  This patient presents for at risk foot care today.  General Appearance  Alert, conversant and in no acute stress.  Vascular  Dorsalis pedis  pulses are palpable  bilaterally.   Posterior tibal pulsesare absent  B/L.Capillary return is within normal limits  bilaterally. Temperature is within normal limits  bilaterally.  Neurologic  Senn-Weinstein monofilament wire test within normal limits  bilaterally. Muscle power within normal limits bilaterally.  Nails Thick disfigured discolored nails with subungual debris  hallux nails  bilaterally. No evidence of bacterial infection or drainage bilaterally.  Orthopedic  No limitations of motion  feet .  No crepitus or effusions noted. Retrocalcaneal exostosis  right  greater than left foot.  Skin  normotropic skin with no porokeratosis noted bilaterally.  No signs of infections or ulcers noted.     Onychomycosis  Pain in right toes  Pain in left toes  Consent was obtained for treatment procedures.   Mechanical debridement of nails 1-5  bilaterally performed with a nail nipper.  Filed with dremel without incident.    Return office visit    3 months.  Requests Dr.  Sherryle Lis                 Told patient to return for periodic foot care and evaluation due to potential at risk complications.   Gardiner Barefoot DPM

## 2022-05-31 ENCOUNTER — Inpatient Hospital Stay: Payer: Medicare Other | Admitting: Nurse Practitioner

## 2022-05-31 ENCOUNTER — Inpatient Hospital Stay: Payer: Medicare Other

## 2022-06-23 ENCOUNTER — Ambulatory Visit: Payer: Medicare Other | Admitting: Nurse Practitioner

## 2022-06-23 ENCOUNTER — Other Ambulatory Visit: Payer: Medicare Other

## 2022-07-14 ENCOUNTER — Telehealth: Payer: Self-pay

## 2022-07-14 ENCOUNTER — Encounter: Payer: Self-pay | Admitting: Nurse Practitioner

## 2022-07-14 ENCOUNTER — Inpatient Hospital Stay (HOSPITAL_BASED_OUTPATIENT_CLINIC_OR_DEPARTMENT_OTHER): Payer: Medicare Other | Admitting: Nurse Practitioner

## 2022-07-14 ENCOUNTER — Inpatient Hospital Stay: Payer: Medicare Other | Attending: Oncology

## 2022-07-14 VITALS — BP 161/80 | HR 88 | Temp 98.2°F | Resp 18 | Ht 62.0 in | Wt 209.6 lb

## 2022-07-14 DIAGNOSIS — C7800 Secondary malignant neoplasm of unspecified lung: Secondary | ICD-10-CM | POA: Diagnosis not present

## 2022-07-14 DIAGNOSIS — Z08 Encounter for follow-up examination after completed treatment for malignant neoplasm: Secondary | ICD-10-CM | POA: Diagnosis not present

## 2022-07-14 DIAGNOSIS — Z85038 Personal history of other malignant neoplasm of large intestine: Secondary | ICD-10-CM | POA: Diagnosis present

## 2022-07-14 DIAGNOSIS — C189 Malignant neoplasm of colon, unspecified: Secondary | ICD-10-CM

## 2022-07-14 LAB — CEA (ACCESS): CEA (CHCC): 7.84 ng/mL — ABNORMAL HIGH (ref 0.00–5.00)

## 2022-07-14 NOTE — Progress Notes (Signed)
[facility] OFFICE PROGRESS NOTE   Diagnosis: Colon cancer  INTERVAL HISTORY:   Peggy French returns as scheduled.  Overall feels well.  Bowels moving regularly.  She occasionally takes Lomotil.  She has a good appetite.  Objective:  Vital signs in last 24 hours:  Blood pressure (!) 161/80, pulse 88, temperature 98.2 F (36.8 C), temperature source Oral, resp. rate 18, height 5\' 2"  (1.575 m), weight 209 lb 9.6 oz (95.1 kg), SpO2 98 %.    Lymphatics: No palpable cervical, supraclavicular, axillary or inguinal lymph nodes. Resp: Lungs clear bilaterally. Cardio: Regular rate and rhythm. GI: Abdomen soft and nontender.  No hepatosplenomegaly.  No mass. Vascular: No leg edema.   Lab Results:  Lab Results  Component Value Date   WBC 5.0 09/29/2020   HGB 12.8 09/29/2020   HCT 39.9 09/29/2020   MCV 85.6 09/29/2020   PLT 181 09/29/2020   NEUTROABS 2.4 09/29/2020    Imaging:  No results found.  Medications: I have reviewed the patient's current medications.  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 She began adjuvant CAPOX chemotherapy on 02/24/2012. She completed cycle 8 on 07/27/2012. Negative surveillance colonoscopy 12/08/2012 Delayed nausea following cycle 1 CAPOX. Aloxi was added beginning with cycle 2 with improvement. Diabetes. Microcytic anemia. Hemoglobin is normal. She will discontinue iron. Remote history of a "colon tumor" removed endoscopically in 1980. Left lower lung nodule noted on a CT 02/21/2012, slightly larger than on a CT 12/03/2011. Chest CT 06/12/2012 showed the previously seen 5 mm pulmonary nodule in left lower lobe was scarcely visualized, estimated at 3 mm. No other suspicious pulmonary nodules were identified. Chest CT 11/27/2012 showed interval enlargement of the left lower lobe pulmonary nodule with a current measurement of 7 mm x 8 mm. No new pulmonary nodules. CT  02/22/2013 with enlargement of the anterior left lower lobe nodule and no other lung nodules. PET scan 03/02/2013 showed no associated hypermetabolism with the 12 mm left lower lobe pulmonary nodule. No hypermetabolic lymph nodes in the neck, chest, abdomen or pelvis. No abnormal hypermetabolic activity within the liver, pancreas, adrenal glands or spleen. Abnormal FDG accumulation in the posterior left nasopharyngeal mucosa. Status post a wedge resection of the left lower lobe nodule on 03/27/2013 with the pathology confirming metastatic adenocarcinoma of colorectal primary.   Chest CT 07/18/2013-negative for recurrent disease.   Chest CT 09/19/2013 to evaluate for shortness of breath showed postoperative changes in the left lower lobe without evidence of recurrent or metastatic disease. CT chest 04/01/2014 with postsurgical scarring in the left lower lobe. No evidence for metastatic disease in the chest. Stable appearance of mild subpleural reticulation of the lung bases. CT chest/abdomen/pelvis 10/01/2014 with postoperative changes left lower lobe. No evidence of metastatic disease in the chest, abdomen or pelvis.  CT chest/abdomen/pelvis 11/03/2015 with no evidence of recurrent malignancy. Colonoscopy 03/25/2016-tubular adenomas removed from the transverse colon and rectum CT chest/abdomen/pelvis 06/17/2017-no evidence of recurrent malignancy. CT chest/abdomen/pelvis 06/14/2018-no evidence of recurrent disease CTs chest/abdomen/pelvis 06/27/2019-no evidence of recurrent disease Elevated [name] 12/25/2020 CTs 01/19/2021-no evidence of metastatic disease Colonoscopy 07/29/2021-8 mm polyp in the proximal transverse colon (tubular adenoma), diverticulosis in the sigmoid colon Status post Port-A-Cath placement 02/22/2012. Status post Port-A-Cath repositioning 03/14/2012. Port-A-Cath removed 06/04/2014 Probable acute laryngopharyngeal dysesthesia following cycle 2 CAPOX. Symptoms resolved with warming.    Oxaliplatin neuropathy with prolonged cold sensitivity. She has persistent neuropathy symptoms involving the feet. Improved with Neurontin History of anterior  chest pain-she has been evaluated by cardiology Hand-foot syndrome secondary to Xeloda-improved with a dose reduction of Xeloda.      Disposition: [name] appears stable.  She remains in clinical remission from colon cancer.  We will follow-up on the [name] from today.  She will return for a [name] and follow-up visit in 4 months.  We are available to see her sooner if needed.     Ned Card ANP/GNP-BC   07/14/2022  1:13 PM

## 2022-07-14 NOTE — Telephone Encounter (Signed)
-----   Message from Owens Shark, NP sent at 07/14/2022  3:14 PM EST ----- Please let her know the [name] is stable with mild elevation.  Follow-up as scheduled.

## 2022-07-14 NOTE — Telephone Encounter (Signed)
Patient gave verbal understanding and had no further question or concerns

## 2022-07-27 ENCOUNTER — Ambulatory Visit: Payer: Medicare Other | Admitting: Podiatry

## 2022-07-27 DIAGNOSIS — M79674 Pain in right toe(s): Secondary | ICD-10-CM

## 2022-07-27 DIAGNOSIS — E669 Obesity, unspecified: Secondary | ICD-10-CM | POA: Diagnosis not present

## 2022-07-27 DIAGNOSIS — B351 Tinea unguium: Secondary | ICD-10-CM | POA: Diagnosis not present

## 2022-07-27 DIAGNOSIS — M79675 Pain in left toe(s): Secondary | ICD-10-CM

## 2022-07-27 DIAGNOSIS — E1169 Type 2 diabetes mellitus with other specified complication: Secondary | ICD-10-CM | POA: Diagnosis not present

## 2022-07-27 NOTE — Progress Notes (Signed)
  Subjective:  Patient ID: Peggy French, female    DOB: 04/15/1941,  MRN: [id number]  Chief Complaint  Patient presents with   Nail Problem    Thick painful toenails, 3 month follow up    82 y.o. female presents with the above complaint. History confirmed with patient.  Her diabetes is well-controlled nails are thickened elongated and causing discomfort  Objective:  Physical Exam: warm, good capillary refill, no trophic changes or ulcerative lesions, normal DP and PT pulses, and normal sensory exam. Left Foot: dystrophic yellowed discolored nail plates with subungual debris Right Foot: dystrophic yellowed discolored nail plates with subungual debris  Assessment:   1. Pain due to onychomycosis of toenails of both feet   2. Diabetes mellitus type 2 in obese [facility])      Plan:  Patient was evaluated and treated and all questions answered.  Patient educated on diabetes. Discussed proper diabetic foot care and discussed risks and complications of disease. Educated patient in depth on reasons to return to the office immediately should he/she discover anything concerning or new on the feet. All questions answered. Discussed proper shoes as well.   Discussed the etiology and treatment options for the condition in detail with the patient. Educated patient on the topical and oral treatment options for mycotic nails. Recommended debridement of the nails today. Sharp and mechanical debridement performed of all painful and mycotic nails today. Nails debrided in length and thickness using a nail nipper to level of comfort. Discussed treatment options including appropriate shoe gear. Follow up as needed for painful nails.    Return in about 3 months (around 10/26/2022) for at risk diabetic foot care.

## 2022-08-21 ENCOUNTER — Other Ambulatory Visit: Payer: Self-pay | Admitting: Internal Medicine

## 2022-10-26 ENCOUNTER — Ambulatory Visit: Payer: Medicare Other | Admitting: Podiatry

## 2022-10-27 ENCOUNTER — Ambulatory Visit: Payer: Medicare Other | Admitting: Podiatry

## 2022-10-27 DIAGNOSIS — M79675 Pain in left toe(s): Secondary | ICD-10-CM

## 2022-10-27 DIAGNOSIS — B351 Tinea unguium: Secondary | ICD-10-CM | POA: Diagnosis not present

## 2022-10-27 DIAGNOSIS — M79674 Pain in right toe(s): Secondary | ICD-10-CM | POA: Diagnosis not present

## 2022-10-29 NOTE — Progress Notes (Signed)
  Subjective:  Patient ID: Peggy French, female    DOB: [birth year],  MRN: [id number]  Chief Complaint  Patient presents with   Nail Problem    Thick painful toenails, 3 month follow up    82 y.o. female presents with the above complaint. History confirmed with patient.  Her diabetes is well-controlled nails are thickened elongated and causing discomfort.  Previous debridement was helpful  Objective:  Physical Exam: warm, good capillary refill, no trophic changes or ulcerative lesions, normal DP and PT pulses, and normal sensory exam. Left Foot: dystrophic yellowed discolored nail plates with subungual debris Right Foot: dystrophic yellowed discolored nail plates with subungual debris  Assessment:   1. Pain due to onychomycosis of toenails of both feet      Plan:  Patient was evaluated and treated and all questions answered.  Patient educated on diabetes. Discussed proper diabetic foot care and discussed risks and complications of disease. Educated patient in depth on reasons to return to the office immediately should he/she discover anything concerning or new on the feet. All questions answered. Discussed proper shoes as well.   Discussed the etiology and treatment options for the condition in detail with the patient.  Prior debridement has been helpful in reducing pain and improving function. Recommended debridement of the nails today. Sharp and mechanical debridement performed of all painful and mycotic nails today. Nails debrided in length and thickness using a nail nipper to level of comfort. Discussed treatment options including appropriate shoe gear. Follow up as needed for painful nails.    Return in about 14 weeks (around 02/02/2023) for at risk diabetic foot care.

## 2022-10-31 ENCOUNTER — Other Ambulatory Visit: Payer: Self-pay | Admitting: Physician Assistant

## 2022-11-03 ENCOUNTER — Other Ambulatory Visit: Payer: Self-pay | Admitting: Physician Assistant

## 2022-11-12 ENCOUNTER — Inpatient Hospital Stay: Payer: Medicare Other | Attending: Oncology

## 2022-11-12 ENCOUNTER — Inpatient Hospital Stay: Payer: Medicare Other | Admitting: Oncology

## 2022-11-12 VITALS — BP 135/64 | HR 90 | Temp 98.1°F | Resp 20 | Ht 62.0 in | Wt 206.2 lb

## 2022-11-12 DIAGNOSIS — Z85038 Personal history of other malignant neoplasm of large intestine: Secondary | ICD-10-CM | POA: Diagnosis present

## 2022-11-12 DIAGNOSIS — Z08 Encounter for follow-up examination after completed treatment for malignant neoplasm: Secondary | ICD-10-CM | POA: Diagnosis present

## 2022-11-12 DIAGNOSIS — D509 Iron deficiency anemia, unspecified: Secondary | ICD-10-CM | POA: Insufficient documentation

## 2022-11-12 DIAGNOSIS — G620 Drug-induced polyneuropathy: Secondary | ICD-10-CM | POA: Diagnosis not present

## 2022-11-12 DIAGNOSIS — E119 Type 2 diabetes mellitus without complications: Secondary | ICD-10-CM | POA: Diagnosis not present

## 2022-11-12 DIAGNOSIS — C7800 Secondary malignant neoplasm of unspecified lung: Secondary | ICD-10-CM | POA: Diagnosis not present

## 2022-11-12 DIAGNOSIS — C189 Malignant neoplasm of colon, unspecified: Secondary | ICD-10-CM | POA: Diagnosis not present

## 2022-11-12 LAB — CEA (ACCESS): CEA (CHCC): 6.57 ng/mL — ABNORMAL HIGH (ref 0.00–5.00)

## 2022-11-12 NOTE — Progress Notes (Signed)
[facility] OFFICE PROGRESS NOTE   Diagnosis: Colon cancer  INTERVAL HISTORY:   Peggy French returns as scheduled.  She feels well.  Good appetite.  No difficulty with bowel function.  She has a occasional "cramps "in the lateral left abdomen.  This has been a symptom for years.  Objective:  Vital signs in last 24 hours:  Blood pressure 135/64, pulse 90, temperature 98.1 F (36.7 C), temperature source Oral, resp. rate 20, height 5\' 2"  (1.575 m), weight 206 lb 3.2 oz (93.5 kg), SpO2 98 %.    Lymphatics: No cervical, supraclavicular, axillary, or inguinal nodes Resp: Lungs clear bilaterally Cardio: Regular rate and rhythm GI: No hepatosplenomegaly, mild tenderness in the left and right mid abdomen, no mass Vascular: No leg edema    Lab Results:  Lab Results  Component Value Date   WBC 5.0 09/29/2020   HGB 12.8 09/29/2020   HCT 39.9 09/29/2020   MCV 85.6 09/29/2020   PLT 181 09/29/2020   NEUTROABS 2.4 09/29/2020    CMP  Lab Results  Component Value Date   NA 139 07/27/2021   K 3.7 07/27/2021   CL 103 07/27/2021   CO2 27 07/27/2021   GLUCOSE 135 (H) 07/27/2021   BUN 12 07/27/2021   CREATININE 0.82 07/27/2021   CALCIUM 8.9 07/27/2021   PROT 7.4 09/29/2020   ALBUMIN 3.6 09/29/2020   AST 34 09/29/2020   ALT 29 09/29/2020   ALKPHOS 86 09/29/2020   BILITOT 0.4 09/29/2020   GFRNONAA >60 07/27/2021   GFRAA 57 (L) 06/27/2019    Lab Results  Component Value Date   [name]1 4.29 03/25/2021   [name] 6.57 (H) 11/12/2022      Medications: I have reviewed the patient's current medications.   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 She began adjuvant CAPOX chemotherapy on 02/24/2012. She completed cycle 8 on 07/27/2012. Negative surveillance colonoscopy 12/08/2012 Delayed nausea following cycle 1 CAPOX. Aloxi was added beginning with cycle 2 with improvement. Diabetes. Microcytic  anemia. Hemoglobin is normal. She will discontinue iron. Remote history of a "colon tumor" removed endoscopically in 1980. Left lower lung nodule noted on a CT 02/21/2012, slightly larger than on a CT 12/03/2011. Chest CT 06/12/2012 showed the previously seen 5 mm pulmonary nodule in left lower lobe was scarcely visualized, estimated at 3 mm. No other suspicious pulmonary nodules were identified. Chest CT 11/27/2012 showed interval enlargement of the left lower lobe pulmonary nodule with a current measurement of 7 mm x 8 mm. No new pulmonary nodules. CT 02/22/2013 with enlargement of the anterior left lower lobe nodule and no other lung nodules. PET scan 03/02/2013 showed no associated hypermetabolism with the 12 mm left lower lobe pulmonary nodule. No hypermetabolic lymph nodes in the neck, chest, abdomen or pelvis. No abnormal hypermetabolic activity within the liver, pancreas, adrenal glands or spleen. Abnormal FDG accumulation in the posterior left nasopharyngeal mucosa. Status post a wedge resection of the left lower lobe nodule on 03/27/2013 with the pathology confirming metastatic adenocarcinoma of colorectal primary.   Chest CT 07/18/2013-negative for recurrent disease.   Chest CT 09/19/2013 to evaluate for shortness of breath showed postoperative changes in the left lower lobe without evidence of recurrent or metastatic disease. CT chest 04/01/2014 with postsurgical scarring in the left lower lobe. No evidence for metastatic disease in the chest. Stable appearance of mild subpleural reticulation of the lung bases. CT chest/abdomen/pelvis 10/01/2014 with postoperative changes left lower lobe.  No evidence of metastatic disease in the chest, abdomen or pelvis.  CT chest/abdomen/pelvis 11/03/2015 with no evidence of recurrent malignancy. Colonoscopy 03/25/2016-tubular adenomas removed from the transverse colon and rectum CT chest/abdomen/pelvis 06/17/2017-no evidence of recurrent malignancy. CT  chest/abdomen/pelvis 06/14/2018-no evidence of recurrent disease CTs chest/abdomen/pelvis 06/27/2019-no evidence of recurrent disease Elevated [name] 12/25/2020 CTs 01/19/2021-no evidence of metastatic disease Colonoscopy 07/29/2021-8 mm polyp in the proximal transverse colon (tubular adenoma), diverticulosis in the sigmoid colon Status post Port-A-Cath placement 02/22/2012. Status post Port-A-Cath repositioning 03/14/2012. Port-A-Cath removed 06/04/2014 Probable acute laryngopharyngeal dysesthesia following cycle 2 CAPOX. Symptoms resolved with warming.   Oxaliplatin neuropathy with prolonged cold sensitivity. She has persistent neuropathy symptoms involving the feet. 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    Disposition: Peggy French is in clinical remission from colon cancer there is chronic mild elevation of the [name].  I have a low clinical suspicion for progression of colon cancer or another malignancy.  We will refer her for CTs if there is a progressive rise in the [name] or if she develops new symptoms.  She will return for an office visit in 4 months.  Thornton Papas, MD  11/12/2022  1:05 PM

## 2022-11-15 ENCOUNTER — Telehealth: Payer: Self-pay | Admitting: *Deleted

## 2022-11-15 NOTE — Telephone Encounter (Signed)
Notified patient that [name] is stable, actually lower than last draw. F/U as scheduled

## 2023-02-03 ENCOUNTER — Ambulatory Visit: Payer: Medicare Other | Admitting: Podiatry

## 2023-02-07 ENCOUNTER — Encounter: Payer: Self-pay | Admitting: Podiatry

## 2023-02-07 ENCOUNTER — Ambulatory Visit: Payer: Medicare Other | Admitting: Podiatry

## 2023-02-07 DIAGNOSIS — M7661 Achilles tendinitis, right leg: Secondary | ICD-10-CM

## 2023-02-07 DIAGNOSIS — M79674 Pain in right toe(s): Secondary | ICD-10-CM | POA: Diagnosis not present

## 2023-02-07 DIAGNOSIS — B351 Tinea unguium: Secondary | ICD-10-CM

## 2023-02-07 DIAGNOSIS — M79675 Pain in left toe(s): Secondary | ICD-10-CM

## 2023-02-07 NOTE — Patient Instructions (Signed)

## 2023-02-07 NOTE — Progress Notes (Signed)
  Subjective:  Patient ID: Peggy French, female    DOB: [birth year],  MRN: [id number]  Chief Complaint  Patient presents with   Diabetes    "I'm here to get my toenails clipped.  That bone spur is giving me a fit but I don't want that injection."    82 y.o. female presents with the above complaint. History confirmed with patient.  Her diabetes is well-controlled nails are thickened elongated and causing discomfort.  Previous debridement was helpful.  She is also dealing with pain in the back of the heel on the right side.  Previously has had an injection here but does not want this.  Pain is worse first thing when she gets up and starts walking on it.  Objective:  Physical Exam: warm, good capillary refill, no trophic changes or ulcerative lesions, normal DP and PT pulses, and normal sensory exam. Left Foot: dystrophic yellowed discolored nail plates with subungual debris Right Foot: dystrophic yellowed discolored nail plates with subungual debris.  Painful bony spur posterior heel  Assessment:   1. Pain due to onychomycosis of toenails of both feet   2. Achilles tendinitis of right lower extremity      Plan:  Patient was evaluated and treated and all questions answered.  Patient educated on diabetes. Discussed proper diabetic foot care and discussed risks and complications of disease. Educated patient in depth on reasons to return to the office immediately should he/she discover anything concerning or new on the feet. All questions answered. Discussed proper shoes as well.   Discussed the etiology and treatment options for the condition in detail with the patient.  Prior debridement has been helpful in reducing pain and improving function. Recommended debridement of the nails today. Sharp and mechanical debridement performed of all painful and mycotic nails today. Nails debrided in length and thickness using a nail nipper to level of comfort. Discussed treatment options including  appropriate shoe gear. Follow up as needed for painful nails.   Discussed the etiology and treatment options for Achilles tendinitis including stretching, formal physical therapy with an eccentric exercises therapy plan, supportive shoegears such as a running shoe or sneaker, heel lifts, topical and oral medications.  We also discussed that I do not routinely perform injections in this area because of the risk of an increased damage or rupture of the tendon.  We also discussed the role of surgical treatment of this for patients who do not improve after exhausting non-surgical treatment options.  -Educated on stretching and icing of the affected limb. -Recommend using Voltaren gel 3-4 times daily on the posterior Achilles  Return in about 3 months (around 05/10/2023) for at risk diabetic foot care.

## 2023-03-15 ENCOUNTER — Inpatient Hospital Stay: Payer: Medicare Other | Admitting: Nurse Practitioner

## 2023-03-15 ENCOUNTER — Inpatient Hospital Stay: Payer: Medicare Other

## 2023-03-18 ENCOUNTER — Encounter: Payer: Self-pay | Admitting: Nurse Practitioner

## 2023-03-18 ENCOUNTER — Other Ambulatory Visit: Payer: Self-pay

## 2023-03-18 ENCOUNTER — Inpatient Hospital Stay: Payer: Medicare Other | Attending: Oncology

## 2023-03-18 ENCOUNTER — Inpatient Hospital Stay: Payer: Medicare Other

## 2023-03-18 ENCOUNTER — Inpatient Hospital Stay: Payer: Medicare Other | Admitting: Nurse Practitioner

## 2023-03-18 ENCOUNTER — Telehealth: Payer: Self-pay

## 2023-03-18 VITALS — BP 140/74 | HR 89 | Temp 98.2°F | Resp 20 | Ht 62.0 in | Wt 204.1 lb

## 2023-03-18 DIAGNOSIS — Z23 Encounter for immunization: Secondary | ICD-10-CM

## 2023-03-18 DIAGNOSIS — Z85038 Personal history of other malignant neoplasm of large intestine: Secondary | ICD-10-CM | POA: Insufficient documentation

## 2023-03-18 DIAGNOSIS — R970 Elevated carcinoembryonic antigen [CEA]: Secondary | ICD-10-CM | POA: Insufficient documentation

## 2023-03-18 DIAGNOSIS — C7800 Secondary malignant neoplasm of unspecified lung: Secondary | ICD-10-CM | POA: Diagnosis not present

## 2023-03-18 DIAGNOSIS — C189 Malignant neoplasm of colon, unspecified: Secondary | ICD-10-CM | POA: Diagnosis not present

## 2023-03-18 LAB — CEA (ACCESS): CEA (CHCC): 6.18 ng/mL — ABNORMAL HIGH (ref 0.00–5.00)

## 2023-03-18 MED ORDER — INFLUENZA VAC A&B SURF ANT ADJ 0.5 ML IM SUSY
0.5000 mL | PREFILLED_SYRINGE | INTRAMUSCULAR | Status: AC
  Administered 2023-03-18: 0.5 mL via INTRAMUSCULAR
  Filled 2023-03-18: qty 0.5

## 2023-03-18 MED ORDER — INFLUENZA VAC A&B SURF ANT ADJ 0.5 ML IM SUSY
0.5000 mL | PREFILLED_SYRINGE | INTRAMUSCULAR | Status: DC
  Filled 2023-03-18: qty 0.5

## 2023-03-18 NOTE — Telephone Encounter (Signed)
Patient gave verbal understanding and had no further questions to concerns

## 2023-03-18 NOTE — Progress Notes (Signed)
[facility] OFFICE PROGRESS NOTE   Diagnosis: Colon cancer  INTERVAL HISTORY:   Peggy French returns as scheduled.  No complaints.  Bowels moving regularly.  She occasionally notes a small amount of blood on the toilet tissue after a bowel movement.  She thinks this may be due to hemorrhoids.  She has a good appetite.  No cough or shortness of breath.  Objective:  Vital signs in last 24 hours:  Blood pressure (!) 140/74, pulse 89, temperature 98.2 F (36.8 C), temperature source Oral, resp. rate 20, height 5\' 2"  (1.575 m), weight 204 lb 1.6 oz (92.6 kg), SpO2 99%.    Lymphatics: No palpable cervical, supraclavicular, axillary or inguinal lymph nodes. Resp: Lungs clear bilaterally. Cardio: Regular rate and rhythm.   GI: No hepatosplenomegaly.  No mass. Vascular: No leg edema.  Lab Results:  Lab Results  Component Value Date   WBC 5.0 09/29/2020   HGB 12.8 09/29/2020   HCT 39.9 09/29/2020   MCV 85.6 09/29/2020   PLT 181 09/29/2020   NEUTROABS 2.4 09/29/2020    Imaging:  No results found.  Medications: I have reviewed the patient's current medications.  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 She began adjuvant CAPOX chemotherapy on 02/24/2012. She completed cycle 8 on 07/27/2012. Negative surveillance colonoscopy 12/08/2012 Delayed nausea following cycle 1 CAPOX. Aloxi was added beginning with cycle 2 with improvement. Diabetes. Microcytic anemia. Hemoglobin is normal. She will discontinue iron. Remote history of a "colon tumor" removed endoscopically in 1980. Left lower lung nodule noted on a CT 02/21/2012, slightly larger than on a CT 12/03/2011. Chest CT 06/12/2012 showed the previously seen 5 mm pulmonary nodule in left lower lobe was scarcely visualized, estimated at 3 mm. No other suspicious pulmonary nodules were identified. Chest CT 11/27/2012 showed interval enlargement of the left  lower lobe pulmonary nodule with a current measurement of 7 mm x 8 mm. No new pulmonary nodules. CT 02/22/2013 with enlargement of the anterior left lower lobe nodule and no other lung nodules. PET scan 03/02/2013 showed no associated hypermetabolism with the 12 mm left lower lobe pulmonary nodule. No hypermetabolic lymph nodes in the neck, chest, abdomen or pelvis. No abnormal hypermetabolic activity within the liver, pancreas, adrenal glands or spleen. Abnormal FDG accumulation in the posterior left nasopharyngeal mucosa. Status post a wedge resection of the left lower lobe nodule on 03/27/2013 with the pathology confirming metastatic adenocarcinoma of colorectal primary.   Chest CT 07/18/2013-negative for recurrent disease.   Chest CT 09/19/2013 to evaluate for shortness of breath showed postoperative changes in the left lower lobe without evidence of recurrent or metastatic disease. CT chest 04/01/2014 with postsurgical scarring in the left lower lobe. No evidence for metastatic disease in the chest. Stable appearance of mild subpleural reticulation of the lung bases. CT chest/abdomen/pelvis 10/01/2014 with postoperative changes left lower lobe. No evidence of metastatic disease in the chest, abdomen or pelvis.  CT chest/abdomen/pelvis 11/03/2015 with no evidence of recurrent malignancy. Colonoscopy 03/25/2016-tubular adenomas removed from the transverse colon and rectum CT chest/abdomen/pelvis 06/17/2017-no evidence of recurrent malignancy. CT chest/abdomen/pelvis 06/14/2018-no evidence of recurrent disease CTs chest/abdomen/pelvis 06/27/2019-no evidence of recurrent disease Elevated [name] 12/25/2020 CTs 01/19/2021-no evidence of metastatic disease Colonoscopy 07/29/2021-8 mm polyp in the proximal transverse colon (tubular adenoma), diverticulosis in the sigmoid colon Status post Port-A-Cath placement 02/22/2012. Status post Port-A-Cath repositioning 03/14/2012. Port-A-Cath removed 06/04/2014 Probable  acute laryngopharyngeal dysesthesia following cycle 2 CAPOX. Symptoms resolved with warming.  Oxaliplatin neuropathy with prolonged cold sensitivity. She has persistent neuropathy symptoms involving the feet. 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    Disposition: Peggy French remains in clinical remission from colon cancer.  The [name] has been mildly elevated for the past several years.  We will follow-up on the value from today.  The plan is for CTs if there is a progressive rise in the [name] or she develops a concerning symptom.  She will return for an office visit and [name] in 4 months.  She will receive the influenza vaccine today.  Lonna Cobb ANP/GNP-BC   03/18/2023  2:40 PM

## 2023-03-18 NOTE — Telephone Encounter (Signed)
-----   Message from Lonna Cobb sent at 03/18/2023  3:34 PM EDT ----- Please let her know the [name] remains mildly elevated, no significant change over the past several years.  Follow-up as scheduled.

## 2023-03-18 NOTE — Patient Instructions (Signed)

## 2023-05-16 ENCOUNTER — Ambulatory Visit (INDEPENDENT_AMBULATORY_CARE_PROVIDER_SITE_OTHER): Payer: Medicare Other | Admitting: Podiatry

## 2023-05-16 ENCOUNTER — Encounter: Payer: Self-pay | Admitting: Podiatry

## 2023-05-16 DIAGNOSIS — B351 Tinea unguium: Secondary | ICD-10-CM

## 2023-05-16 DIAGNOSIS — M79674 Pain in right toe(s): Secondary | ICD-10-CM | POA: Diagnosis not present

## 2023-05-16 DIAGNOSIS — M79675 Pain in left toe(s): Secondary | ICD-10-CM

## 2023-05-16 NOTE — Progress Notes (Signed)
  Subjective:  Patient ID: Peggy French, female    DOB: 1940/08/02,  MRN: [id number]  Chief Complaint  Patient presents with   Diabetes    "My toenails"    82 y.o. female presents with the above complaint. History confirmed with patient.  Her diabetes is well-controlled nails are thickened elongated and causing discomfort.  Previous debridement was helpful.   Her husband recently passed away  Objective:  Physical Exam: warm, good capillary refill, no trophic changes or ulcerative lesions, normal DP and PT pulses, and normal sensory exam. Left Foot: dystrophic yellowed discolored nail plates with subungual debris Right Foot: dystrophic yellowed discolored nail plates with subungual debris.     Assessment:   1. Pain due to onychomycosis of toenails of both feet       Plan:  Patient was evaluated and treated and all questions answered.    Discussed the etiology and treatment options for the condition in detail with the patient.  Prior debridement has been helpful in reducing pain and improving function. Recommended debridement of the nails today. Sharp and mechanical debridement performed of all painful and mycotic nails today. Nails debrided in length and thickness using a nail nipper to level of comfort. Discussed treatment options including appropriate shoe gear. Follow up as needed for painful nails.    Return in about 3 months (around 08/16/2023) for diabetic foot examination, at risk diabetic foot care.

## 2023-05-16 NOTE — Patient Instructions (Signed)
VISIT SUMMARY:  During today's visit, we focused on your type 2 diabetes management and a recent urinary tract infection. We discussed your intermittent foot pain, which is likely due to diabetic neuropathy, and reviewed your recent elevated A1c levels. We also addressed your recent UTI and the new medication started to help regulate your blood sugar.  YOUR PLAN:  -TYPE 2 DIABETES MELLITUS WITH DIABETIC NEUROPATHY: Type 2 Diabetes Mellitus is a condition where your body does not use insulin properly, leading to high blood sugar levels. Diabetic Neuropathy is nerve damage caused by high blood sugar. We will continue your current dose of Gabapentin to help manage the nerve pain in your feet. Please monitor your blood glucose levels regularly to keep your A1c below 8%. Check your feet daily for any cuts, scrapes, or infections and contact the clinic if you notice any issues. Annual foot check-ups are scheduled.   INSTRUCTIONS:  Please continue taking your medications as prescribed. Monitor your blood glucose levels regularly and aim to keep your A1c below 8%. Check your feet daily for any cuts, scrapes, or infections and contact the clinic if you notice any issues.  Annual foot check-ups are scheduled.

## 2023-07-18 ENCOUNTER — Inpatient Hospital Stay: Payer: Medicare Other | Admitting: Oncology

## 2023-07-18 ENCOUNTER — Inpatient Hospital Stay: Payer: Medicare Other

## 2023-07-18 ENCOUNTER — Other Ambulatory Visit: Payer: Medicare Other

## 2023-08-10 ENCOUNTER — Inpatient Hospital Stay: Payer: Medicare Other | Admitting: Oncology

## 2023-08-10 ENCOUNTER — Inpatient Hospital Stay: Payer: Medicare Other | Attending: Oncology

## 2023-08-10 VITALS — BP 138/69 | HR 97 | Temp 98.1°F | Resp 18 | Ht 62.0 in | Wt 208.2 lb

## 2023-08-10 DIAGNOSIS — Z85038 Personal history of other malignant neoplasm of large intestine: Secondary | ICD-10-CM | POA: Insufficient documentation

## 2023-08-10 DIAGNOSIS — Z85118 Personal history of other malignant neoplasm of bronchus and lung: Secondary | ICD-10-CM | POA: Diagnosis present

## 2023-08-10 DIAGNOSIS — Z08 Encounter for follow-up examination after completed treatment for malignant neoplasm: Secondary | ICD-10-CM | POA: Insufficient documentation

## 2023-08-10 DIAGNOSIS — D509 Iron deficiency anemia, unspecified: Secondary | ICD-10-CM | POA: Diagnosis not present

## 2023-08-10 DIAGNOSIS — C189 Malignant neoplasm of colon, unspecified: Secondary | ICD-10-CM

## 2023-08-10 DIAGNOSIS — E119 Type 2 diabetes mellitus without complications: Secondary | ICD-10-CM | POA: Diagnosis not present

## 2023-08-10 DIAGNOSIS — C7800 Secondary malignant neoplasm of unspecified lung: Secondary | ICD-10-CM | POA: Diagnosis not present

## 2023-08-10 LAB — CEA (ACCESS): CEA (CHCC): 5 ng/mL (ref 0.00–5.00)

## 2023-08-10 NOTE — Progress Notes (Signed)
[facility] OFFICE PROGRESS NOTE   Diagnosis: Colon cancer  INTERVAL HISTORY:   Peggy French returns as scheduled.  She generally feels well.  Good appetite.  No difficulty with bowel function.  He has chronic pain in the right knee.  He reports 1 episode of bright red blood on the toilet tissue when wiping.  Objective:  Vital signs in last 24 hours:  Blood pressure 138/69, pulse 97, temperature 98.1 F (36.7 C), temperature source Temporal, resp. rate 18, height 5\' 2"  (1.575 m), weight 208 lb 3.2 oz (94.4 kg), SpO2 100%.     Lymphatics: No cervical, supraclavicular, axillary, or inguinal nodes Resp: Lungs clear bilaterally Cardio: Regular rate and rhythm GI: No mass, nontender, no hepatosplenomegaly Vascular: No leg edema  Skin: Left chest scar without evidence of recurrent tumor   Lab Results:  Lab Results  Component Value Date   WBC 5.0 09/29/2020   HGB 12.8 09/29/2020   HCT 39.9 09/29/2020   MCV 85.6 09/29/2020   PLT 181 09/29/2020   NEUTROABS 2.4 09/29/2020    CMP  Lab Results  Component Value Date   NA 139 07/27/2021   K 3.7 07/27/2021   CL 103 07/27/2021   CO2 27 07/27/2021   GLUCOSE 135 (H) 07/27/2021   BUN 12 07/27/2021   CREATININE 0.82 07/27/2021   CALCIUM 8.9 07/27/2021   PROT 7.4 09/29/2020   ALBUMIN 3.6 09/29/2020   AST 34 09/29/2020   ALT 29 09/29/2020   ALKPHOS 86 09/29/2020   BILITOT 0.4 09/29/2020   GFRNONAA >60 07/27/2021   GFRAA 57 (L) 06/27/2019    Lab Results  Component Value Date   [name]1 4.29 03/25/2021   [name] 6.18 (H) 03/18/2023    Medications: I have reviewed the patient's current medications.   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 She began adjuvant CAPOX chemotherapy on 02/24/2012. She completed cycle 8 on 07/27/2012. Negative surveillance colonoscopy 12/08/2012 Delayed nausea following cycle 1 CAPOX. Aloxi was added beginning with cycle  2 with improvement. Diabetes. Microcytic anemia. Hemoglobin is normal. She will discontinue iron. Remote history of a "colon tumor" removed endoscopically in 1980. Left lower lung nodule noted on a CT 02/21/2012, slightly larger than on a CT 12/03/2011. Chest CT 06/12/2012 showed the previously seen 5 mm pulmonary nodule in left lower lobe was scarcely visualized, estimated at 3 mm. No other suspicious pulmonary nodules were identified. Chest CT 11/27/2012 showed interval enlargement of the left lower lobe pulmonary nodule with a current measurement of 7 mm x 8 mm. No new pulmonary nodules. CT 02/22/2013 with enlargement of the anterior left lower lobe nodule and no other lung nodules. PET scan 03/02/2013 showed no associated hypermetabolism with the 12 mm left lower lobe pulmonary nodule. No hypermetabolic lymph nodes in the neck, chest, abdomen or pelvis. No abnormal hypermetabolic activity within the liver, pancreas, adrenal glands or spleen. Abnormal FDG accumulation in the posterior left nasopharyngeal mucosa. Status post a wedge resection of the left lower lobe nodule on 03/27/2013 with the pathology confirming metastatic adenocarcinoma of colorectal primary.   Chest CT 07/18/2013-negative for recurrent disease.   Chest CT 09/19/2013 to evaluate for shortness of breath showed postoperative changes in the left lower lobe without evidence of recurrent or metastatic disease. CT chest 04/01/2014 with postsurgical scarring in the left lower lobe. No evidence for metastatic disease in the chest. Stable appearance of mild subpleural reticulation of the lung bases. CT chest/abdomen/pelvis 10/01/2014 with postoperative  changes left lower lobe. No evidence of metastatic disease in the chest, abdomen or pelvis.  CT chest/abdomen/pelvis 11/03/2015 with no evidence of recurrent malignancy. Colonoscopy 03/25/2016-tubular adenomas removed from the transverse colon and rectum CT chest/abdomen/pelvis 06/17/2017-no  evidence of recurrent malignancy. CT chest/abdomen/pelvis 06/14/2018-no evidence of recurrent disease CTs chest/abdomen/pelvis 06/27/2019-no evidence of recurrent disease Elevated [name] 12/25/2020 CTs 01/19/2021-no evidence of metastatic disease Colonoscopy 07/29/2021-8 mm polyp in the proximal transverse colon (tubular adenoma), diverticulosis in the sigmoid colon Status post Port-A-Cath placement 02/22/2012. Status post Port-A-Cath repositioning 03/14/2012. Port-A-Cath removed 06/04/2014 Probable acute laryngopharyngeal dysesthesia following cycle 2 CAPOX. Symptoms resolved with warming.   Oxaliplatin neuropathy with prolonged cold sensitivity. She has persistent neuropathy symptoms involving the feet. 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      Disposition: Ms. No is in clinical remission from colon cancer.  The [name] is lower today.  She will return for an office visit and [name] in 6 months.  She will call for recurrent rectal bleeding.  Thornton Papas, MD  08/10/2023  11:40 AM

## 2023-08-16 ENCOUNTER — Ambulatory Visit: Payer: Medicare Other | Admitting: Podiatry

## 2023-08-22 ENCOUNTER — Ambulatory Visit (INDEPENDENT_AMBULATORY_CARE_PROVIDER_SITE_OTHER): Payer: Medicare Other | Admitting: Podiatry

## 2023-08-22 ENCOUNTER — Encounter: Payer: Self-pay | Admitting: Podiatry

## 2023-08-22 VITALS — Ht 62.0 in | Wt 208.2 lb

## 2023-08-22 DIAGNOSIS — M79674 Pain in right toe(s): Secondary | ICD-10-CM

## 2023-08-22 DIAGNOSIS — M79675 Pain in left toe(s): Secondary | ICD-10-CM

## 2023-08-22 DIAGNOSIS — B351 Tinea unguium: Secondary | ICD-10-CM | POA: Diagnosis not present

## 2023-08-22 NOTE — Progress Notes (Signed)
  Subjective:  Patient ID: Peggy French, female    DOB: 02/19/1941,  MRN: [id number]  Chief Complaint  Patient presents with   Nail Problem    [facility]    83 y.o. female presents with the above complaint. History confirmed with patient.  Her diabetes is well-controlled nails are thickened elongated and causing discomfort.  Previous debridement was helpful.  Blood sugar remains well-controlled.  No new issues.  Objective:  Physical Exam: warm, good capillary refill, no trophic changes or ulcerative lesions, normal DP and PT pulses, and normal sensory exam. Left Foot: dystrophic yellowed discolored nail plates with subungual debris Right Foot: dystrophic yellowed discolored nail plates with subungual debris.     Assessment:   1. Pain due to onychomycosis of toenails of both feet       Plan:  Patient was evaluated and treated and all questions answered.    Discussed the etiology and treatment options for the condition in detail with the patient.  Prior debridement has been helpful in reducing pain and improving function. Recommended debridement of the nails today. Sharp and mechanical debridement performed of all painful and mycotic nails today. Nails debrided in length and thickness using a nail nipper to level of comfort. Discussed treatment options including appropriate shoe gear. Follow up as needed for painful nails.    Return in about 3 months (around 11/19/2023) for at risk diabetic foot care.

## 2023-08-29 ENCOUNTER — Other Ambulatory Visit: Payer: Self-pay | Admitting: Internal Medicine

## 2023-08-31 ENCOUNTER — Other Ambulatory Visit: Payer: Self-pay | Admitting: Internal Medicine

## 2023-08-31 ENCOUNTER — Other Ambulatory Visit: Payer: Self-pay | Admitting: Physician Assistant

## 2023-09-03 ENCOUNTER — Other Ambulatory Visit: Payer: Self-pay | Admitting: Physician Assistant

## 2023-09-25 ENCOUNTER — Other Ambulatory Visit: Payer: Self-pay | Admitting: Internal Medicine

## 2023-09-28 ENCOUNTER — Telehealth: Payer: Self-pay | Admitting: Student in an Organized Health Care Education/Training Program

## 2023-09-28 ENCOUNTER — Encounter (HOSPITAL_COMMUNITY): Payer: Self-pay

## 2023-09-28 ENCOUNTER — Observation Stay (HOSPITAL_COMMUNITY)
Admission: EM | Admit: 2023-09-28 | Discharge: 2023-09-29 | Disposition: A | Discharge status: Home / Self Care | Attending: Internal Medicine | Admitting: Internal Medicine

## 2023-09-28 ENCOUNTER — Emergency Department (HOSPITAL_COMMUNITY)

## 2023-09-28 ENCOUNTER — Telehealth: Payer: Self-pay | Admitting: Internal Medicine

## 2023-09-28 ENCOUNTER — Other Ambulatory Visit: Payer: Self-pay

## 2023-09-28 DIAGNOSIS — E1169 Type 2 diabetes mellitus with other specified complication: Secondary | ICD-10-CM | POA: Diagnosis not present

## 2023-09-28 DIAGNOSIS — E782 Mixed hyperlipidemia: Secondary | ICD-10-CM | POA: Diagnosis not present

## 2023-09-28 DIAGNOSIS — K219 Gastro-esophageal reflux disease without esophagitis: Secondary | ICD-10-CM | POA: Insufficient documentation

## 2023-09-28 DIAGNOSIS — Z7982 Long term (current) use of aspirin: Secondary | ICD-10-CM | POA: Diagnosis not present

## 2023-09-28 DIAGNOSIS — I10 Essential (primary) hypertension: Secondary | ICD-10-CM | POA: Diagnosis present

## 2023-09-28 DIAGNOSIS — E669 Obesity, unspecified: Secondary | ICD-10-CM | POA: Diagnosis not present

## 2023-09-28 DIAGNOSIS — C7800 Secondary malignant neoplasm of unspecified lung: Secondary | ICD-10-CM | POA: Diagnosis not present

## 2023-09-28 DIAGNOSIS — I2510 Atherosclerotic heart disease of native coronary artery without angina pectoris: Secondary | ICD-10-CM | POA: Diagnosis not present

## 2023-09-28 DIAGNOSIS — Z7984 Long term (current) use of oral hypoglycemic drugs: Secondary | ICD-10-CM | POA: Diagnosis not present

## 2023-09-28 DIAGNOSIS — Z6838 Body mass index (BMI) 38.0-38.9, adult: Secondary | ICD-10-CM | POA: Insufficient documentation

## 2023-09-28 DIAGNOSIS — Z79899 Other long term (current) drug therapy: Secondary | ICD-10-CM | POA: Insufficient documentation

## 2023-09-28 DIAGNOSIS — Z9049 Acquired absence of other specified parts of digestive tract: Secondary | ICD-10-CM | POA: Insufficient documentation

## 2023-09-28 DIAGNOSIS — C189 Malignant neoplasm of colon, unspecified: Secondary | ICD-10-CM | POA: Diagnosis not present

## 2023-09-28 DIAGNOSIS — R0789 Other chest pain: Secondary | ICD-10-CM | POA: Diagnosis present

## 2023-09-28 DIAGNOSIS — R079 Chest pain, unspecified: Principal | ICD-10-CM | POA: Diagnosis present

## 2023-09-28 LAB — CBC WITH DIFFERENTIAL/PLATELET
Abs Immature Granulocytes: 0.03 10*3/uL (ref 0.00–0.07)
Basophils Absolute: 0 10*3/uL (ref 0.0–0.1)
Basophils Relative: 0 %
Eosinophils Absolute: 0.2 10*3/uL (ref 0.0–0.5)
Eosinophils Relative: 4 %
HCT: 39.8 % (ref 36.0–46.0)
Hemoglobin: 12.8 g/dL (ref 12.0–15.0)
Immature Granulocytes: 1 %
Lymphocytes Relative: 38 %
Lymphs Abs: 1.9 10*3/uL (ref 0.7–4.0)
MCH: 27.3 pg (ref 26.0–34.0)
MCHC: 32.2 g/dL (ref 30.0–36.0)
MCV: 84.9 fL (ref 80.0–100.0)
Monocytes Absolute: 0.5 10*3/uL (ref 0.1–1.0)
Monocytes Relative: 10 %
Neutro Abs: 2.3 10*3/uL (ref 1.7–7.7)
Neutrophils Relative %: 47 %
Platelets: 166 10*3/uL (ref 150–400)
RBC: 4.69 MIL/uL (ref 3.87–5.11)
RDW: 13.5 % (ref 11.5–15.5)
WBC: 4.9 10*3/uL (ref 4.0–10.5)
nRBC: 0 % (ref 0.0–0.2)

## 2023-09-28 LAB — COMPREHENSIVE METABOLIC PANEL
ALT: 19 U/L (ref 0–44)
AST: 25 U/L (ref 15–41)
Albumin: 3.7 g/dL (ref 3.5–5.0)
Alkaline Phosphatase: 79 U/L (ref 38–126)
Anion gap: 10 (ref 5–15)
BUN: 13 mg/dL (ref 8–23)
CO2: 28 mmol/L (ref 22–32)
Calcium: 8.9 mg/dL (ref 8.9–10.3)
Chloride: 100 mmol/L (ref 98–111)
Creatinine, Ser: 1.04 mg/dL — ABNORMAL HIGH (ref 0.44–1.00)
GFR, Estimated: 54 mL/min — ABNORMAL LOW (ref >60)
Glucose, Bld: 120 mg/dL — ABNORMAL HIGH (ref 70–99)
Potassium: 3.9 mmol/L (ref 3.5–5.1)
Sodium: 138 mmol/L (ref 135–145)
Total Bilirubin: 0.3 mg/dL (ref 0.0–1.2)
Total Protein: 7.6 g/dL (ref 6.5–8.1)

## 2023-09-28 LAB — TROPONIN I (HIGH SENSITIVITY)
Troponin I (High Sensitivity): 5 ng/L (ref <18)
Troponin I (High Sensitivity): 3 ng/L (ref <18)

## 2023-09-28 MED ORDER — IOHEXOL 350 MG/ML SOLN
75.0000 mL | Freq: Once | INTRAVENOUS | Status: AC | PRN
  Administered 2023-09-28: 75 mL via INTRAVENOUS

## 2023-09-28 NOTE — Telephone Encounter (Signed)
 Received call from patient.She stated she has had 2 episodes of chest pain since this past Monday.Both episodes relieved by NTG x 1.At present her chest feels heavy.Rates heaviness # 7.Advised she needs to go to ED to be evaluated.I will make Dr.Hilty aware.

## 2023-09-28 NOTE — ED Provider Notes (Signed)
 [place] EMERGENCY DEPARTMENT AT [facility] Provider Note   CSN: [id number] Arrival date & time: 09/28/23  1603     History  Chief Complaint  Patient presents with   Chest Pain    Peggy French is a 83 y.o. female.  Patient is an 83 year old female who presents emergency department the chief complaint of intermittent chest pain which has been ongoing for approximately the past week.  Patient notes that the pain is worse with exertion and does improve with rest as well as with her nitroglycerin.  She notes that she did reach out to her cardiologist Dr. Rennis Golden who recommended that she come to the emergency department for further evaluation.  Patient notes that she has had no increased edema in her lower extremities.  She denies any cough, congestion, rhinorrhea, sore throat.  She denies any abdominal pain but does admit to associated nausea.  She has had no associated dizziness, lightheadedness or syncope.   Chest Pain      Home Medications Prior to Admission medications   Medication Sig Start Date End Date Taking? Authorizing Provider  acetaminophen (TYLENOL) 500 MG tablet Take 1,000 mg by mouth every 6 (six) hours as needed for mild pain or moderate pain. For pain    [provider]  allopurinol (ZYLOPRIM) 100 MG tablet Take 100 mg by mouth daily. 04/13/19   [provider]  aspirin EC 81 MG tablet Take 1 tablet (81 mg total) by mouth daily. 07/30/21   Malissa Hippo, MD  Blood Glucose Monitoring Suppl (ONETOUCH VERIO REFLECT) w/Device KIT USE TO CHECK BLOOD GLUCOSE 3 TIMES DAILY BEFORE MEALS 08/13/21   [provider]  carvedilol (COREG) 3.125 MG tablet TAKE 1 TABLET BY MOUTH TWICE A DAY 09/05/23   Hilty, Lisette Abu, MD  COLCRYS 0.6 MG tablet Take 0.6 mg by mouth daily. 04/11/19   [provider]  Cyanocobalamin (B-12 PO) Place 1,000 mcg under the tongue daily.    [provider]  diphenoxylate-atropine (LOMOTIL) 2.5-0.025 MG  tablet Take 1 tablet by mouth 4 (four) times daily as needed for diarrhea or loose stools.    [provider]  esomeprazole (NEXIUM) 40 MG capsule Take 40 mg by mouth every evening. 07/16/11   Rehman, Joline Maxcy, MD  fexofenadine (ALLEGRA) 180 MG tablet Take 180 mg by mouth daily as needed for allergies.    [provider]  hydrochlorothiazide (HYDRODIURIL) 12.5 MG tablet Take 1 tablet (12.5 mg total) by mouth daily. 09/27/23   Hilty, Lisette Abu, MD  isosorbide mononitrate (IMDUR) 30 MG 24 hr tablet Take 1 1/2 tablets by mouth in the morning and at bedtime. 08/31/23   Hilty, Lisette Abu, MD  linagliptin (TRADJENTA) 5 MG TABS tablet Take 5 mg by mouth daily after breakfast.    [provider]  meclizine (ANTIVERT) 25 MG tablet Take 25 mg by mouth 2 (two) times daily as needed for dizziness. 07/30/20   [provider]  Multiple Vitamins-Minerals (WOMENS MULTIVITAMIN PO) Take 1 tablet by mouth daily.    [provider]  nitroGLYCERIN (NITROSTAT) 0.4 MG SL tablet Place 1 tablet (0.4 mg total) under the tongue every 5 (five) minutes as needed for chest pain. Patient not taking: Reported on 01/27/2022 09/29/20 07/17/21  Chrystie Nose, MD  potassium chloride SA (KLOR-CON M20) 20 MEQ tablet Take 1 tablet (20 mEq total) by mouth daily. 11/22/16   Rana Snare, NP  ramipril (ALTACE) 5 MG capsule Take 5 mg  by mouth every evening.    [provider]  simvastatin (ZOCOR) 40 MG tablet Take 40 mg by mouth at bedtime.    [provider]      Allergies    Aspirin    Review of Systems   Review of Systems  Cardiovascular:  Positive for chest pain.  All other systems reviewed and are negative.   Physical Exam Updated Vital Signs BP (!) 151/86   Pulse 85   Temp 98.2 F (36.8 C) (Oral)   Resp (!) 24   Ht 5\' 2"  (1.575 m)   Wt 95 kg   SpO2 97%   BMI 38.31 kg/m  Physical Exam Vitals and nursing note reviewed.  Constitutional:      Appearance: Normal  appearance.  HENT:     Head: Normocephalic and atraumatic.     Nose: Nose normal.     Mouth/Throat:     Mouth: Mucous membranes are moist.  Eyes:     Extraocular Movements: Extraocular movements intact.     Conjunctiva/sclera: Conjunctivae normal.     Pupils: Pupils are equal, round, and reactive to light.  Cardiovascular:     Rate and Rhythm: Normal rate and regular rhythm.     Pulses: Normal pulses.     Heart sounds: Normal heart sounds. No murmur heard. Pulmonary:     Effort: Pulmonary effort is normal.     Breath sounds: Normal breath sounds. No decreased breath sounds, wheezing, rhonchi or rales.  Chest:     Chest wall: No tenderness.  Abdominal:     General: Abdomen is flat. Bowel sounds are normal. There is no abdominal bruit.     Palpations: Abdomen is soft. There is no hepatomegaly or mass.  Musculoskeletal:        General: Normal range of motion.     Cervical back: Normal range of motion and neck supple.     Right lower leg: No edema.     Left lower leg: No edema.  Skin:    General: Skin is warm and dry.     Findings: No ecchymosis or rash.  Neurological:     General: No focal deficit present.     Mental Status: She is alert and oriented to person, place, and time. Mental status is at baseline.  Psychiatric:        Mood and Affect: Mood normal.        Behavior: Behavior normal.        Thought Content: Thought content normal.        Judgment: Judgment normal.     ED Results / Procedures / Treatments   Labs (all labs ordered are listed, but only abnormal results are displayed) Labs Reviewed  COMPREHENSIVE METABOLIC PANEL  CBC WITH DIFFERENTIAL/PLATELET  TROPONIN I (HIGH SENSITIVITY)  TROPONIN I (HIGH SENSITIVITY)    EKG None  Radiology DG Chest 2 View Result Date: 09/28/2023 CLINICAL DATA:  Chest pain since Monday EXAM: CHEST - 2 VIEW COMPARISON:  09/30/2020 FINDINGS: Frontal and lateral views of the chest demonstrate an unremarkable cardiac silhouette.  No airspace disease, effusion, or pneumothorax. No acute bony abnormalities. IMPRESSION: 1. No acute intrathoracic process. Electronically Signed   By: Sharlet Salina M.D.   On: 09/28/2023 17:46    Procedures Procedures    Medications Ordered in ED Medications - No data to display  ED Course/ Medical Decision Making/ A&P  Medical Decision Making Amount and/or Complexity of Data Reviewed Labs: ordered. Radiology: ordered.  Risk Prescription drug management. Decision regarding hospitalization.   This patient presents to the ED for concern of chest pain, this involves an extensive number of treatment options, and is a complaint that carries with it a high risk of complications and morbidity.  The differential diagnosis includes ACS, pulmonary embolus, pericarditis, myocarditis, endocarditis, aortic aneurysm or dissection, pneumonia, pneumothorax, hemothorax   Co morbidities that complicate the patient evaluation  Hypertension, lipidemia, diabetes   Additional history obtained:  Additional history obtained from medical records External records from outside source obtained and reviewed including none   Lab Tests:  I Ordered, and personally interpreted labs.  The pertinent results include: Negative troponins, no leukocytosis, no anemia, normal liver function, kidney function, electrolytes   Imaging Studies ordered:  I ordered imaging studies including CT of the chest and chest x-ray I independently visualized and interpreted imaging which showed no acute cardiopulmonary process, no pulmonary embolus I agree with the radiologist interpretation   Cardiac Monitoring: / EKG:  The patient was maintained on a cardiac monitor.  I personally viewed and interpreted the cardiac monitored which showed an underlying rhythm of: Normal sinus rhythm, rate of 91, normal PR/QRS interval, normal QTc, no ST/T wave changes, no ischemic changes, no  STEMI   Consultations Obtained:  I requested consultation with the cardiology, Dr. Cherly Beach,  and discussed lab and imaging findings as well as pertinent plan - they recommend: Admission, increase Imdur to 60 mg twice daily   Problem List / ED Course / Critical interventions / Medication management  Patient is doing well at this time and does remain stable.  She does remain pain-free at rest.  Did discuss patient case with Dr. Cherly Beach with cardiology who was in agreement to admission for chest pain.  He did recommend increasing the patient's Imdur to 60 mg twice daily.  Patient has had negative serial troponins at this time and EKG with no acute ischemic changes but still has a concerning story for possible underlying stable angina.  Patient had no indication for pulmonary embolus on CT scan of the chest.  Did discuss patient case with Dr. Thomes Dinning who has excepted for admission at this time. Dr. Cherly Beach phone number [phone]   Social Determinants of Health:  None   Test / Admission - Considered:  Admission        Final Clinical Impression(s) / ED Diagnoses Final diagnoses:  None    Rx / DC Orders ED Discharge Orders     None         Kathlen Mody 09/28/23 2255    Bethann Berkshire, MD 09/30/23 [phone]

## 2023-09-28 NOTE — ED Notes (Signed)
 Pt stated, "I have had heaviness since last Saturday. Denies N/V, headache, and dizziness at this time. Pt stated, "it is a heavy feeling." Taken 2 nitroglycerin, heaviness feels a little better. Pt stated she hasn't had to take nitroglycerin in awhile but has had more stress at home.

## 2023-09-28 NOTE — ED Triage Notes (Signed)
 Pt arrived via POV from home c/o heavy chest pain on-going since Monday. Pt reports taking 1X NTG PTA. Pt reports calling her cardiologist and being advised to seek evaluation in the ER.

## 2023-09-28 NOTE — Telephone Encounter (Signed)
 Paged by [facility] regarding Peggy French who presented to [facility] ED with symptoms that seem consistent with stable angina.  She had similar presentation in 2017 with subsequent coronary angiography with mild to moderate nonobstructive CAD and normal LVEF.  She was started on Imdur 30 mg daily at that time for potential microvascular disease.  She currently is prescribed Imdur 45 mg twice daily. She has not been seen by cardiology within our system since 01/14/21 by Dr. Rennis Golden.   She reported CP x1 week duration relieved with rest and occasional SLNG. ECG reviewed and without ischemic changes. hsT ruled out. HEART score 4. She is stable but needs observation and would start with medication titration. Prescribed imdur as above, if she confirms this dosing then would increase from 90 mg daily dose to 120 mg daily dose to start. BP 157/70 so she has room for additional nitrates. If better BP control and additional nitrates improve sx she can be discharged with OP cardiology f/u. If sx persists despite continued medication titration she needs transfer to [facility] for repeat coronary angiography. No bed availability right now and clinically stable so cardiology team will see in the morning unless sx progress.   ECG Result date: 09/28/23 (16:17:02) NSR 91, PR 158, QRS 74, QT/Qtc 350/430 No ischemic changes   CTPE  Result date: 09/28/23 No evidence of pulmonary embolus. No acute cardiopulmonary disease. Scattered coronary artery disease. Aortic Atherosclerosis (ICD10-I70.0).  Labs reviewed: hsT 5->3, sCr 1.04 (bl 0.8-1.0)

## 2023-09-28 NOTE — Telephone Encounter (Signed)
 Pt c/o of Chest Pain: STAT if active (IN THIS MOMENT) CP, including tightness, pressure, jaw pain, shoulder/upper arm/back pain, SOB, nausea, and vomiting.  1. Are you having CP right now (tightness, pressure, or discomfort)? Yes  2. Are you experiencing any other symptoms (ex. SOB, nausea, vomiting, sweating)? SOB, headache, fatigue and a little lightheaded  3. How long have you been experiencing CP? About a week  4. Is your CP continuous or coming and going? Coming and going   5. Have you taken Nitroglycerin? Yes  6. If CP returns before callback, please consider calling 911. ?

## 2023-09-28 NOTE — ED Notes (Signed)
 Report received from [facility]

## 2023-09-29 ENCOUNTER — Observation Stay (HOSPITAL_COMMUNITY)

## 2023-09-29 DIAGNOSIS — I2089 Other forms of angina pectoris: Secondary | ICD-10-CM

## 2023-09-29 DIAGNOSIS — C189 Malignant neoplasm of colon, unspecified: Secondary | ICD-10-CM

## 2023-09-29 DIAGNOSIS — R079 Chest pain, unspecified: Secondary | ICD-10-CM

## 2023-09-29 DIAGNOSIS — E1169 Type 2 diabetes mellitus with other specified complication: Secondary | ICD-10-CM | POA: Diagnosis not present

## 2023-09-29 DIAGNOSIS — I10 Essential (primary) hypertension: Secondary | ICD-10-CM | POA: Diagnosis not present

## 2023-09-29 DIAGNOSIS — K219 Gastro-esophageal reflux disease without esophagitis: Secondary | ICD-10-CM | POA: Insufficient documentation

## 2023-09-29 DIAGNOSIS — E669 Obesity, unspecified: Secondary | ICD-10-CM

## 2023-09-29 DIAGNOSIS — C7800 Secondary malignant neoplasm of unspecified lung: Secondary | ICD-10-CM

## 2023-09-29 DIAGNOSIS — E782 Mixed hyperlipidemia: Secondary | ICD-10-CM

## 2023-09-29 DIAGNOSIS — R0789 Other chest pain: Secondary | ICD-10-CM

## 2023-09-29 LAB — MAGNESIUM: Magnesium: 1.6 mg/dL — ABNORMAL LOW (ref 1.7–2.4)

## 2023-09-29 LAB — COMPREHENSIVE METABOLIC PANEL
ALT: 18 U/L (ref 0–44)
AST: 25 U/L (ref 15–41)
Albumin: 3.5 g/dL (ref 3.5–5.0)
Alkaline Phosphatase: 77 U/L (ref 38–126)
Anion gap: 9 (ref 5–15)
BUN: 11 mg/dL (ref 8–23)
CO2: 27 mmol/L (ref 22–32)
Calcium: 9 mg/dL (ref 8.9–10.3)
Chloride: 103 mmol/L (ref 98–111)
Creatinine, Ser: 0.95 mg/dL (ref 0.44–1.00)
GFR, Estimated: >60 mL/min (ref >60)
Glucose, Bld: 134 mg/dL — ABNORMAL HIGH (ref 70–99)
Potassium: 3.8 mmol/L (ref 3.5–5.1)
Sodium: 139 mmol/L (ref 135–145)
Total Bilirubin: 0.4 mg/dL (ref 0.0–1.2)
Total Protein: 7.1 g/dL (ref 6.5–8.1)

## 2023-09-29 LAB — ECHOCARDIOGRAM COMPLETE
AR max vel: 0.96 cm2
AV Area VTI: 0.93 cm2
AV Area mean vel: 1.04 cm2
AV Mean grad: 10 mmHg
AV Peak grad: 19.7 mmHg
Ao pk vel: 2.22 m/s
Area-P 1/2: 2.22 cm2
Height: 62 in
S' Lateral: 2.7 cm
Weight: 3350.99 [oz_av]

## 2023-09-29 LAB — CBG MONITORING, ED
Glucose-Capillary: 136 mg/dL — ABNORMAL HIGH (ref 70–99)
Glucose-Capillary: 167 mg/dL — ABNORMAL HIGH (ref 70–99)

## 2023-09-29 LAB — CBC
HCT: 40.3 % (ref 36.0–46.0)
Hemoglobin: 13.1 g/dL (ref 12.0–15.0)
MCH: 27.3 pg (ref 26.0–34.0)
MCHC: 32.5 g/dL (ref 30.0–36.0)
MCV: 84.1 fL (ref 80.0–100.0)
Platelets: 150 10*3/uL (ref 150–400)
RBC: 4.79 MIL/uL (ref 3.87–5.11)
RDW: 13.6 % (ref 11.5–15.5)
WBC: 5.1 10*3/uL (ref 4.0–10.5)
nRBC: 0 % (ref 0.0–0.2)

## 2023-09-29 LAB — PHOSPHORUS: Phosphorus: 3.8 mg/dL (ref 2.5–4.6)

## 2023-09-29 LAB — HEMOGLOBIN A1C
Hgb A1c MFr Bld: 7.6 % — ABNORMAL HIGH (ref 4.8–5.6)
Mean Plasma Glucose: 171.42 mg/dL

## 2023-09-29 MED ORDER — NITROGLYCERIN 0.4 MG SL SUBL: 0.4000 mg | SUBLINGUAL_TABLET | SUBLINGUAL | Status: DC | PRN

## 2023-09-29 MED ORDER — ISOSORBIDE MONONITRATE ER 60 MG PO TB24: 60.0000 mg | ORAL_TABLET | Freq: Two times a day (BID) | ORAL | 0 refills | Status: DC

## 2023-09-29 MED ORDER — ASPIRIN 81 MG PO TBEC
81.0000 mg | DELAYED_RELEASE_TABLET | Freq: Every day | ORAL | Status: DC
Start: 2023-09-29 — End: 2023-09-30
  Administered 2023-09-29: 81 mg via ORAL
  Filled 2023-09-29: qty 1

## 2023-09-29 MED ORDER — INSULIN ASPART 100 UNIT/ML IJ SOLN
0.0000 [IU] | Freq: Three times a day (TID) | INTRAMUSCULAR | Status: DC
  Administered 2023-09-29: 3 [IU] via SUBCUTANEOUS
  Administered 2023-09-29: 2 [IU] via SUBCUTANEOUS
  Filled 2023-09-29: qty 1

## 2023-09-29 MED ORDER — SIMVASTATIN 10 MG PO TABS: 40.0000 mg | ORAL_TABLET | Freq: Every day | ORAL | Status: DC

## 2023-09-29 MED ORDER — ISOSORBIDE MONONITRATE ER 60 MG PO TB24
60.0000 mg | ORAL_TABLET | Freq: Two times a day (BID) | ORAL | Status: DC
  Administered 2023-09-29: 60 mg via ORAL
  Filled 2023-09-29: qty 1

## 2023-09-29 MED ORDER — ONDANSETRON HCL 4 MG PO TABS: 4.0000 mg | ORAL_TABLET | Freq: Four times a day (QID) | ORAL | Status: DC | PRN

## 2023-09-29 MED ORDER — RAMIPRIL 5 MG PO CAPS
5.0000 mg | ORAL_CAPSULE | Freq: Every evening | ORAL | Status: DC
  Filled 2023-09-29: qty 1

## 2023-09-29 MED ORDER — ACETAMINOPHEN 650 MG RE SUPP: 650.0000 mg | Freq: Four times a day (QID) | RECTAL | Status: DC | PRN

## 2023-09-29 MED ORDER — MELATONIN 3 MG PO TABS
6.0000 mg | ORAL_TABLET | Freq: Once | ORAL | Status: AC
  Administered 2023-09-29: 6 mg via ORAL
  Filled 2023-09-29: qty 2

## 2023-09-29 MED ORDER — CARVEDILOL 3.125 MG PO TABS
3.1250 mg | ORAL_TABLET | Freq: Two times a day (BID) | ORAL | Status: DC
  Administered 2023-09-29: 3.125 mg via ORAL
  Filled 2023-09-29: qty 1

## 2023-09-29 MED ORDER — ACETAMINOPHEN 325 MG PO TABS: 650.0000 mg | ORAL_TABLET | Freq: Four times a day (QID) | ORAL | Status: DC | PRN

## 2023-09-29 MED ORDER — PERFLUTREN LIPID MICROSPHERE
1.0000 mL | INTRAVENOUS | Status: DC | PRN
  Administered 2023-09-29: 3 mL via INTRAVENOUS

## 2023-09-29 MED ORDER — ONDANSETRON HCL 4 MG/2ML IJ SOLN: 4.0000 mg | Freq: Four times a day (QID) | INTRAMUSCULAR | Status: DC | PRN

## 2023-09-29 MED ORDER — PANTOPRAZOLE SODIUM 40 MG PO TBEC
80.0000 mg | DELAYED_RELEASE_TABLET | Freq: Every day | ORAL | Status: DC
  Administered 2023-09-29: 80 mg via ORAL
  Filled 2023-09-29: qty 2

## 2023-09-29 MED ORDER — ENOXAPARIN SODIUM 40 MG/0.4ML IJ SOSY
40.0000 mg | PREFILLED_SYRINGE | INTRAMUSCULAR | Status: DC
  Administered 2023-09-29: 40 mg via SUBCUTANEOUS
  Filled 2023-09-29: qty 0.4

## 2023-09-29 NOTE — ED Notes (Addendum)
 Patient discharged. RN informed patient about discharge and to call ride and that RN will be gathering d/c paperwork and she will be ready to go in about 10 minutes. Patient stated she would call her ride after finishing her food.  RN went into room for discharge and informed patient it is time to leave, patient did state she called for her ride when RN had informed her. Patient expressed disappointment in discharge process and does not want to wait in lobby for her ride. RN stated the ER has other critical patients that need to be seen. Patient stated "I wanted my turn and they can wait theirs." RN informed patient about process of ER discharge and she is cleared by her providers to go home and will have to wait in the lobby for her ride. RN informed patient she can take the rest of her meal with her if she'd like (about 25% left of meal) but patient declined and stated "I wanted to eat it hot". RN offered assistance in getting patient dressed and patient declined. RN stepped out of room for patient to get dressed. RN back in the room after 5 minutes. Patient still expressing disappointment in discharge process at this time. RN explained reasoning for process again but patient not receptive to explanation.  Paperwork given to patient and reviewed. Pt verbalized understanding. VSS. A+Ox4. Patient wheeled out to the lobby to wait for ride. Patients belongings and cane with her.

## 2023-09-29 NOTE — Care Management Obs Status (Signed)
 MEDICARE OBSERVATION STATUS NOTIFICATION   Patient Details  Name: Peggy French MRN: [id number] Date of Birth: [birth year]   Medicare Observation Status Notification Given:  Yes    Barron Alvine, RN 09/29/2023, 6:18 PM

## 2023-09-29 NOTE — Consult Note (Addendum)
 Cardiology Consultation   Patient ID: Peggy French MRN: [id number]; DOB: [birth year]  Admit date: 09/28/2023 Date of Consult: 09/29/2023  PCP:  Mirna Mires, MD   [facility] Providers Cardiologist:  Chrystie Nose, MD        Patient Profile:   Peggy French is a 83 y.o. female with a hx of CAD (s/p mild CAD by prior caths in 2001, 2012 and 06/2016 with low-risk NST in 06/2019), HTN, HLD, Type 2 DM and history of colon cancer (s/p hemicolectomy and chemotherapy in 2013)  who is being seen 09/29/2023 for the evaluation of chest pain at the request of Dr. Thomes Dinning.  History of Present Illness:   Ms. Pant presented to [facility] ED on 09/28/2023 for evaluation of chest pain which started 2 days prior. In talking with the patient today, she reports being under increased stress for the past several months. Her husband passed away in 10/27/2024and she has been raising her son who has dementia. Also reports recently undergoing a kitchen remodel due to a water pipe bursting. She has not been sleeping well and her blood pressure has been very elevated when checked at home. Over the past 2 days, she reports having at least 3 episodes of chest pain which have resolved with SL NTG. Episodes have occurred while sitting and no association with exertion or positional changes. She is tender to palpation along her sternal region today. Breathing has been stable with no specific orthopnea, PND or pitting edema.  Initial labs showed WBC 4.9, Hgb 12.8, platelets 166, Na+ 138, K+ 3.9 and creatinine 1.04. Initial and repeat Hs troponin values negative at 5 and 3. CXR with no acute abnormalities. CTA showed no evidence of a PE and no other acute cardiopulmonary abnormalities. Was noted to have scattered coronary calcifications and aortic atherosclerosis.  EKG shows normal sinus rhythm, heart rate 91 with no acute ST abnormalities.  The on-call cardiology provider did recommend increasing  Imdur from 45 mg twice daily to 60 mg twice daily and scheduled to receive the first dose this morning.    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 s/p right hemicolectomy/chemo 2013 with mets to the lung s/p LLL wedge resection 2014.   Diabetes mellitus    x over 10 yrs   GERD (gastroesophageal reflux disease)    Gout    H/O hiatal hernia    History of colon cancer    History of shingles    Hyperlipidemia    Hypertension    EKG 10/12 EPIC, chest- 1 view  6/13 EPIC    Last 2D Echo on 05/02/2012 showed EF of greater than 55%   Microcytic anemia    Mild CAD    a. cath by Dr. Rennis Golden in 03/2011 - 20-30% ostial bifurcation stenosis of LAD, otherwise only mild luminal irregularities in LAD/RCA, LVEF 60%, no RWMA.   c. 06/18/16 LHC after abnormal nuc showed mild non obst CAD   Neuropathy    Obesity     Past Surgical History:  Procedure Laterality Date   ABDOMINAL HYSTERECTOMY     APPENDECTOMY     CARDIAC CATHETERIZATION  2001,2012   CARDIAC CATHETERIZATION N/A 06/18/2016   Procedure: Left Heart Cath and Coronary Angiography;  Surgeon: Yvonne Kendall, MD;  Location: [facility] INVASIVE CV LAB;  Service: Cardiovascular;  Laterality: N/A;   CATARACT EXTRACTION W/PHACO Left 02/17/2015  Procedure: CATARACT EXTRACTION PHACO AND INTRAOCULAR LENS PLACEMENT (IOC);  Surgeon: Gemma Payor, MD;  Location: [facility];  Service: Ophthalmology;  Laterality: Left;  CDE:8.01    CATARACT EXTRACTION W/PHACO Right 08/03/2021   Procedure: CATARACT EXTRACTION PHACO AND INTRAOCULAR LENS PLACEMENT (IOC);  Surgeon: Fabio Pierce, MD;  Location: [facility];  Service: Ophthalmology;  Laterality: Right;  CDE: 13.97   CHOLECYSTECTOMY     COLON SURGERY     PARTIAL COLECTOMY 30 YRS / AGAIN 2013   COLONOSCOPY  11/26/2011   Procedure: COLONOSCOPY;  Surgeon: Malissa Hippo, MD;  Location: [facility];  Service: Endoscopy;  Laterality: N/A;  2:15    COLONOSCOPY N/A 12/08/2012   Procedure: COLONOSCOPY;  Surgeon: Malissa Hippo, MD;  Location: [facility];  Service: Endoscopy;  Laterality: N/A;  815-moved to 0950 Ann to notify pt   COLONOSCOPY N/A 03/25/2016   Procedure: COLONOSCOPY;  Surgeon: Malissa Hippo, MD;  Location: [facility];  Service: Endoscopy;  Laterality: N/A;  1200   COLONOSCOPY WITH PROPOFOL N/A 07/29/2021   Procedure: COLONOSCOPY WITH PROPOFOL;  Surgeon: Malissa Hippo, MD;  Location: [facility];  Service: Endoscopy;  Laterality: N/A;  11:10   ESOPHAGOGASTRODUODENOSCOPY  06/16/2011   Procedure: ESOPHAGOGASTRODUODENOSCOPY (EGD);  Surgeon: Malissa Hippo, MD;  Location: [facility];  Service: Endoscopy;  Laterality: N/A;  2:15   POLYPECTOMY  07/29/2021   Procedure: POLYPECTOMY;  Surgeon: Malissa Hippo, MD;  Location: [facility];  Service: Endoscopy;;  transverse colon   PORT-A-CATH REMOVAL N/A 06/04/2014   Procedure: REMOVAL PORT-A-CATH;  Surgeon: Valarie Merino, MD;  Location: [facility];  Service: General;  Laterality: N/A;   PORTACATH PLACEMENT  02/22/2012   Procedure: INSERTION PORT-A-CATH;  Surgeon: Valarie Merino, MD;  Location: [facility];  Service: General;  Laterality: N/A;  left subclavian   VIDEO ASSISTED THORACOSCOPY (VATS)/WEDGE RESECTION Left 03/27/2013   Procedure: VIDEO ASSISTED THORACOSCOPY (VATS)/WEDGE RESECTION;  Surgeon: Delight Ovens, MD;  Location: [facility] OR;  Service: Thoracic;  Laterality: Left;   VIDEO BRONCHOSCOPY N/A 03/27/2013   Procedure: VIDEO BRONCHOSCOPY;  Surgeon: Delight Ovens, MD;  Location: [facility] OR;  Service: Thoracic;  Laterality: N/A;     Home Medications:  Prior to Admission medications   Medication Sig Start Date End Date Taking? Authorizing Provider  acetaminophen (TYLENOL) 500 MG tablet Take 1,000 mg by mouth every 6 (six) hours as needed for mild pain or moderate pain. For pain   Yes [provider]  allopurinol (ZYLOPRIM) 100 MG tablet Take 100 mg by mouth  daily. 04/13/19  Yes [provider]  aspirin EC 81 MG tablet Take 1 tablet (81 mg total) by mouth daily. 07/30/21  Yes Rehman, Joline Maxcy, MD  carvedilol (COREG) 3.125 MG tablet TAKE 1 TABLET BY MOUTH TWICE A DAY 09/05/23  Yes Hilty, Lisette Abu, MD  Cyanocobalamin (B-12 PO) Place 1,000 mcg under the tongue daily.   Yes [provider]  diphenoxylate-atropine (LOMOTIL) 2.5-0.025 MG tablet Take 1 tablet by mouth 4 (four) times daily as needed for diarrhea or loose stools.   Yes [provider]  esomeprazole (NEXIUM) 40 MG capsule Take 40 mg by mouth every evening. 07/16/11  Yes Rehman, Joline Maxcy, MD  fexofenadine (ALLEGRA) 180 MG tablet Take 180 mg by mouth daily as needed for allergies.   Yes [provider]  hydrochlorothiazide (HYDRODIURIL) 12.5 MG tablet Take 1 tablet (12.5 mg total) by mouth daily. 09/27/23  Yes Zoila Shutter  C, MD  isosorbide mononitrate (IMDUR) 30 MG 24 hr tablet Take 1 1/2 tablets by mouth in the morning and at bedtime. 08/31/23  Yes Hilty, Lisette Abu, MD  linagliptin (TRADJENTA) 5 MG TABS tablet Take 5 mg by mouth daily after breakfast.   Yes [provider]  meclizine (ANTIVERT) 25 MG tablet Take 25 mg by mouth 2 (two) times daily. 07/30/20  Yes [provider]  MITIGARE 0.6 MG CAPS Take 1 capsule by mouth daily. 04/13/23  Yes [provider]  Multiple Vitamins-Minerals (WOMENS MULTIVITAMIN PO) Take 1 tablet by mouth daily.   Yes [provider]  nitroGLYCERIN (NITROSTAT) 0.4 MG SL tablet Place 1 tablet (0.4 mg total) under the tongue every 5 (five) minutes as needed for chest pain. 09/29/20 09/28/23 Yes Hilty, Lisette Abu, MD  potassium chloride SA (KLOR-CON M20) 20 MEQ tablet Take 1 tablet (20 mEq total) by mouth daily. 11/22/16  Yes Rana Snare, NP  ramipril (ALTACE) 5 MG capsule Take 5 mg by mouth every evening.   Yes [provider]  simvastatin (ZOCOR) 40 MG tablet Take 40 mg by mouth at bedtime.   Yes  [provider]  Blood Glucose Monitoring Suppl (ONETOUCH VERIO REFLECT) w/Device KIT USE TO CHECK BLOOD GLUCOSE 3 TIMES DAILY BEFORE MEALS 08/13/21   [provider]  traZODone (DESYREL) 50 MG tablet Take 50 mg by mouth at bedtime. Patient not taking: Reported on 09/28/2023 09/05/23   [provider]    Inpatient Medications: Scheduled Meds:  enoxaparin (LOVENOX) injection  40 mg Subcutaneous Q24H   insulin aspart  0-15 Units Subcutaneous TID WC    PRN Meds: acetaminophen **OR** acetaminophen, ondansetron **OR** ondansetron (ZOFRAN) IV  Allergies:    Allergies  Allergen Reactions   Aspirin Palpitations    Can tolerate 81 mg    Social History:   Social History   Socioeconomic History   Marital status: Married    Spouse name: Not on file   Number of children: 4   Years of education: Not on file   Highest education level: Not on file  Occupational History    Employer: RETIRED  Tobacco Use   Smoking status: Never   Smokeless tobacco: Never  Vaping Use   Vaping status: Never Used  Substance and Sexual Activity   Alcohol use: No   Drug use: No   Sexual activity: Not Currently  Other Topics Concern   Not on file  Social History Narrative   Married to husband, Molly Maduro   Retired Scientist, product/process development    Family History:    Family History  Problem Relation Age of Onset   Cancer Maternal Aunt        throat,      ROS:  Please see the history of present illness. All other ROS reviewed and negative.     Physical Exam/Data:   Vitals:   09/29/23 0300 09/29/23 0500 09/29/23 0534 09/29/23 0700  BP: (!) 180/76 (!) 158/83  (!) 160/75  Pulse: 83 84  87  Resp: 16 16  15   Temp:   98.6 F (37 C)   TempSrc:   Oral   SpO2: 94% 95%  93%  Weight:      Height:       No intake or output data in the 24 hours ending 09/29/23 0837    09/28/2023    4:16 PM 08/22/2023    1:57 PM 08/10/2023   11:01 AM  Last 3 Weights  Weight (lbs) 209  lb 7 oz 208 lb 3.2 oz  208 lb 3.2 oz  Weight (kg) 95 kg 94.439 kg 94.439 kg     Body mass index is 38.31 kg/m.  General:  Well nourished, well developed female appearing in no acute distress HEENT: normal Neck: no JVD Vascular: No carotid bruits; Distal pulses 2+ bilaterally Cardiac:  normal S1, S2; RRR; no murmur  Lungs:  clear to auscultation bilaterally, no wheezing, rhonchi or rales  Abd: soft, nontender, no hepatomegaly  Ext: no pitting edema Musculoskeletal:  No deformities, BUE and BLE strength normal and equal Skin: warm and dry  Neuro:  CNs 2-12 intact, no focal abnormalities noted Psych:  Normal affect   EKG:  The EKG was personally reviewed and demonstrates: Normal sinus rhythm, heart rate 91 with no acute ST abnormalities. Telemetry:  Telemetry was personally reviewed and demonstrates: NSR, HR in 80's to 90's with occasional PVC's.   Relevant CV Studies:  Cardiac Catheterization: 06/2016 Conclusions: Mild to moderate non-obstructive coronary artery disease of up to 30% involving the LAD, LCx, and RCA. Low normal left ventricular filling pressure (LVEDP 5 mmHg). Normal left ventricular contraction (LVEF 60-65%).   Recommendations: Discontinue nitroglycerin infusion and start isosorbide mononitrate 30 mg daily for possible microvascular disease. Continue amlodipine and propranolol. IV hydration given low normal LVEDP. Continue risk factor modification, including statin therapy.  NST: 06/2019 Nuclear stress EF: 62%. There was no ST segment deviation noted during stress. No T wave inversion was noted during stress. This is a low risk study.   Normal perfusion. LVEF 62% with normal wall motion. This is a low risk study. No change compared to prior in 2017.  Echocardiogram: 09/2020 IMPRESSIONS     1. Left ventricular ejection fraction, by estimation, is 55 to 60%. The  left ventricle has normal function. The left ventricle has no regional  wall motion abnormalities. There is mild left  ventricular hypertrophy.  Left ventricular diastolic parameters  are consistent with Grade I diastolic dysfunction (impaired relaxation).   2. Right ventricular systolic function is normal. The right ventricular  size is normal.   3. The mitral valve is normal in structure. No evidence of mitral valve  regurgitation. No evidence of mitral stenosis.   4. The aortic valve has an indeterminant number of cusps. There is  moderate calcification of the aortic valve. There is moderate thickening  of the aortic valve. Aortic valve regurgitation is not visualized. No  aortic stenosis is present.   5. The inferior vena cava is normal in size with greater than 50%  respiratory variability, suggesting right atrial pressure of 3 mmHg.    Laboratory Data:  High Sensitivity Troponin:   Recent Labs  Lab 09/28/23 1848 09/28/23 2042  TROPONINIHS 5 3     Chemistry Recent Labs  Lab 09/28/23 1848 09/29/23 0531  NA 138 139  K 3.9 3.8  CL 100 103  CO2 28 27  GLUCOSE 120* 134*  BUN 13 11  CREATININE 1.04* 0.95  CALCIUM 8.9 9.0  MG  --  1.6*  GFRNONAA 54* >60  ANIONGAP 10 9    Recent Labs  Lab 09/28/23 1848 09/29/23 0531  PROT 7.6 7.1  ALBUMIN 3.7 3.5  AST 25 25  ALT 19 18  ALKPHOS 79 77  BILITOT 0.3 0.4   Hematology Recent Labs  Lab 09/28/23 1848 09/29/23 0531  WBC 4.9 5.1  RBC 4.69 4.79  HGB 12.8 13.1  HCT 39.8 40.3  MCV 84.9 84.1  MCH 27.3 27.3  MCHC 32.2 32.5  RDW 13.5 13.6  PLT 166 150    Radiology/Studies:  CT Angio Chest PE W/Cm &/Or Wo Cm Result Date: 09/28/2023 CLINICAL DATA:  Pulmonary embolism (PE) suspected, high prob. Chest heaviness. EXAM: CT ANGIOGRAPHY CHEST WITH CONTRAST TECHNIQUE: Multidetector CT imaging of the chest was performed using the standard protocol during bolus administration of intravenous contrast. Multiplanar CT image reconstructions and MIPs were obtained to evaluate the vascular anatomy. RADIATION DOSE REDUCTION: This exam was performed  according to the departmental dose-optimization program which includes automated exposure control, adjustment of the mA and/or kV according to patient size and/or use of iterative reconstruction technique. CONTRAST:  75mL OMNIPAQUE IOHEXOL 350 MG/ML SOLN COMPARISON:  01/19/2021 FINDINGS: Cardiovascular: No filling defects in the pulmonary arteries to suggest pulmonary emboli. Heart is normal size. Aorta is normal caliber. Scattered coronary artery and aortic atherosclerosis. Mediastinum/Nodes: No mediastinal, hilar, or axillary adenopathy. Trachea and esophagus are unremarkable. Thyroid diffusely enlarged, but stable since prior study with no visible focal abnormality. Small hiatal hernia. Lungs/Pleura: No confluent airspace opacities or effusions. Upper Abdomen: Imaging into the upper abdomen demonstrates no acute findings. Musculoskeletal: Chest wall soft tissues are unremarkable. No acute bony abnormality. Review of the MIP images confirms the above findings. IMPRESSION: No evidence of pulmonary embolus. No acute cardiopulmonary disease. Scattered coronary artery disease. Aortic Atherosclerosis (ICD10-I70.0). Electronically Signed   By: Charlett Nose M.D.   On: 09/28/2023 20:10   DG Chest 2 View Result Date: 09/28/2023 CLINICAL DATA:  Chest pain since Monday EXAM: CHEST - 2 VIEW COMPARISON:  09/30/2020 FINDINGS: Frontal and lateral views of the chest demonstrate an unremarkable cardiac silhouette. No airspace disease, effusion, or pneumothorax. No acute bony abnormalities. IMPRESSION: 1. No acute intrathoracic process. Electronically Signed   By: Sharlet Salina M.D.   On: 09/28/2023 17:46    Assessment and Plan:   1. Chest Pain with Atypical Features - Her episodes of chest pain overall seem atypical as they occur at rest and her pain is reproducible on palpation.  Episodes have been relieved with nitroglycerin. 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 Hs troponin values have been negative and EKG is without acute ST changes. Will obtain an echocardiogram to assess for any structural abnormalities. If this is reassuring, would not anticipate further ischemic evaluation this admission. Agree with increasing Imdur from 45 mg twice daily to 60 mg twice daily. Can reassess as an outpatient if she requires further medication adjustments based off symptoms and follow BP readings.  2. CAD/HLD - She has a history of known CAD but mild CAD by prior cardiac catheterizations in 2001, 2012 and 2017. Most recent ischemic evaluation was a low-risk NST in 06/2019. Will plan for a follow-up echocardiogram as discussed above and titration of Imdur from 45 mg twice daily to 60 mg twice daily. Continue ASA 81 mg daily and Simvastatin 40 mg daily (would defer to her PCP who manages this but would likely switch to Crestor as an outpatient).  3. HTN - BP has been elevated while in the ED, at 160/75 on most recent check. She also reports this has been elevated when checked at home. She has been continued on Coreg 3.125 mg twice daily and Ramipril 5 mg daily. Will titrate Imdur from 45 mg twice daily to 60 mg twice daily. If BP remains above goal, could further titrate Ramipril or Coreg as an outpatient.  For questions or updates, please contact [facility] Please consult [email or web address] for contact info under    Signed, Ellsworth Lennox, PA-C  09/29/2023 8:37 AM   Attending note:  Patient seen and examined.  I reviewed her records and discussed the case with Ms. Patrick Jupiter, I agree with her above findings.  Ms. Birenbaum presents with recent recurring chest discomfort with mixed features.  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  She states that submental nitroglycerin has been  helpful, no reproducible exertional component.  Some thoracic soreness to palpation.  She has ruled out for ACS.  Chest CTA shows no evidence of pulmonary embolus or acute process.  On examination this morning she appears comfortable, no active symptoms.  Afebrile, heart rate in the 80s in sinus rhythm, blood pressure with systolic in the 150s.  Lungs are clear.  Cardiac exam with RRR and 2/6 systolic murmur.  No peripheral edema.  Pertinent lab work includes potassium 3.8, creatinine 0.95, hemoglobin 13.1, platelets 150, high-sensitivity troponin I normal.  I reviewed her ECG which shows normal sinus rhythm.  Chest CTA shows no pulmonary embolus, scattered coronary artery calcifications and aortic atherosclerosis.  Patient presents with atypical chest discomfort, although microvascular angina certainly possible.  No evidence of ACS and ECG is normal.  Echocardiogram pending, unless there are significant structural abnormalities, doubt further inpatient workup required and she can be managed medically as an outpatient.  Plan to uptitrate Imdur as discussed, may ultimately need further titration of antihypertensive therapy as well depending on blood pressure trend.  Jonelle Sidle, M.D., F.A.C.C.

## 2023-09-29 NOTE — TOC CM/SW Note (Signed)
 Transition of Care [facility]) - Inpatient Brief Assessment   Patient Details  Name: Peggy French MRN: [id number] Date of Birth: March 14, 1941  [facility] of [facility]) CM/SW Contact:    Isabella Bowens, LCSWA Phone Number: 09/29/2023, 8:12 AM   Clinical Narrative:  Transition of Care Department [facility]) has reviewed patient and no TOC needs have been identified at this time. We will continue to monitor patient advancement through interdisciplinary progression rounds. If new patient transition needs arise, please place a TOC consult.  Transition of Care Asessment: Insurance and Status: Insurance coverage has been reviewed Patient has primary care physician: Yes Home environment has been reviewed: Single Family Home with son Prior level of function:: Independent Prior/Current Home Services: No current home services Social Drivers of Health Review: SDOH reviewed no interventions necessary Readmission risk has been reviewed: Yes Transition of care needs: no transition of care needs at this time

## 2023-09-29 NOTE — Progress Notes (Signed)
*  PRELIMINARY RESULTS* Echocardiogram 2D Echocardiogram has been performed with Definity.  Stacey Drain 09/29/2023, 5:57 PM

## 2023-09-29 NOTE — H&P (Addendum)
 History and Physical    Patient: Peggy French XBJ:[id number] DOB: 05/18/1941 DOA: 09/28/2023 DOS: the patient was seen and examined on 09/29/2023 PCP: Mirna Mires, MD  Patient coming from: Home  Chief Complaint:  Chief Complaint  Patient presents with   Chest Pain   HPI: Peggy French is an 83 y.o. female with medical history significant of T2DM, GERD, hypertension, hyperlipidemia, remote history of colon cancer, Obesity who presents to the emergency department due to 1 week onset of intermittent exertional chest pain that improves with rest and sublingual nitroglycerin.  Chest pain was described as something heavy sitting on left side of chest with intermittent radiation to left arm and left side of the neck.  Cardiologist's office (Dr. Rennis Golden) was consulted and recommended patient going to the ED.  She complained of associated nausea, but denies increased leg swelling, cough, congestion, lightheadedness or dizziness.  Patient states that she took nitroglycerin sublingual x 2 and the chest pain resolved.  ED Course:  In the emergency department, BP was 151/86, respiratory rate was 24/min, other vital signs are within normal range.  Workup in the ED showed normal CBC and BMP except for blood glucose of 120, creatinine 1.04.  Troponin 5 > 3. CT angiography chest with contrast showed no evidence of pulmonary embolus and no acute cardiopulmonary disease Chest x-ray showed no acute intrathoracic process Cardiologist (Dr. Cherly Beach) was consulted and recommended increasing Imdur dose to 120 mg from 90 mg daily and to observe patient overnight, it was recommended for patient to be transferred to [facility] if she continues to have symptoms overnight. Hospitalist was asked admit patient for further evaluation and management.  Review of Systems: Review of systems as noted in the HPI. All other systems reviewed and are negative.   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 s/p right hemicolectomy/chemo 2013 with mets to the lung s/p LLL wedge resection 2014.   Diabetes mellitus    x over 10 yrs   GERD (gastroesophageal reflux disease)    Gout    H/O hiatal hernia    History of colon cancer    History of shingles    Hyperlipidemia    Hypertension    EKG 10/12 EPIC, chest- 1 view  6/13 EPIC    Last 2D Echo on 05/02/2012 showed EF of greater than 55%   Microcytic anemia    Mild CAD    a. cath by Dr. Rennis Golden in 03/2011 - 20-30% ostial bifurcation stenosis of LAD, otherwise only mild luminal irregularities in LAD/RCA, LVEF 60%, no RWMA.   c. 06/18/16 LHC after abnormal nuc showed mild non obst CAD   Neuropathy    Obesity    Past Surgical History:  Procedure Laterality Date   ABDOMINAL HYSTERECTOMY     APPENDECTOMY     CARDIAC CATHETERIZATION  2001,2012   CARDIAC CATHETERIZATION N/A 06/18/2016   Procedure: Left Heart Cath and Coronary Angiography;  Surgeon: Yvonne Kendall, MD;  Location: [facility] INVASIVE CV LAB;  Service: Cardiovascular;  Laterality: N/A;   CATARACT EXTRACTION W/PHACO Left 02/17/2015   Procedure: CATARACT EXTRACTION PHACO AND INTRAOCULAR LENS PLACEMENT (IOC);  Surgeon: Gemma Payor, MD;  Location: [facility];  Service: Ophthalmology;  Laterality: Left;  CDE:8.01    CATARACT EXTRACTION W/PHACO Right 08/03/2021   Procedure: CATARACT EXTRACTION PHACO AND INTRAOCULAR LENS PLACEMENT (IOC);  Surgeon: Fabio Pierce, MD;  Location: [facility];  Service: Ophthalmology;  Laterality: Right;  CDE: 13.97   CHOLECYSTECTOMY     COLON SURGERY     PARTIAL COLECTOMY 30 YRS / AGAIN 2013   COLONOSCOPY  11/26/2011   Procedure: COLONOSCOPY;  Surgeon: Malissa Hippo, MD;  Location: [facility];  Service: Endoscopy;  Laterality: N/A;  2:15   COLONOSCOPY N/A 12/08/2012   Procedure: COLONOSCOPY;  Surgeon: Malissa Hippo, MD;  Location: [facility];  Service: Endoscopy;  Laterality: N/A;  815-moved to 0950 Ann to notify pt    COLONOSCOPY N/A 03/25/2016   Procedure: COLONOSCOPY;  Surgeon: Malissa Hippo, MD;  Location: [facility];  Service: Endoscopy;  Laterality: N/A;  1200   COLONOSCOPY WITH PROPOFOL N/A 07/29/2021   Procedure: COLONOSCOPY WITH PROPOFOL;  Surgeon: Malissa Hippo, MD;  Location: [facility];  Service: Endoscopy;  Laterality: N/A;  11:10   ESOPHAGOGASTRODUODENOSCOPY  06/16/2011   Procedure: ESOPHAGOGASTRODUODENOSCOPY (EGD);  Surgeon: Malissa Hippo, MD;  Location: [facility];  Service: Endoscopy;  Laterality: N/A;  2:15   POLYPECTOMY  07/29/2021   Procedure: POLYPECTOMY;  Surgeon: Malissa Hippo, MD;  Location: [facility];  Service: Endoscopy;;  transverse colon   PORT-A-CATH REMOVAL N/A 06/04/2014   Procedure: REMOVAL PORT-A-CATH;  Surgeon: Valarie Merino, MD;  Location: [facility];  Service: General;  Laterality: N/A;   PORTACATH PLACEMENT  02/22/2012   Procedure: INSERTION PORT-A-CATH;  Surgeon: Valarie Merino, MD;  Location: [facility];  Service: General;  Laterality: N/A;  left subclavian   VIDEO ASSISTED THORACOSCOPY (VATS)/WEDGE RESECTION Left 03/27/2013   Procedure: VIDEO ASSISTED THORACOSCOPY (VATS)/WEDGE RESECTION;  Surgeon: Delight Ovens, MD;  Location: [facility] OR;  Service: Thoracic;  Laterality: Left;   VIDEO BRONCHOSCOPY N/A 03/27/2013   Procedure: VIDEO BRONCHOSCOPY;  Surgeon: Delight Ovens, MD;  Location: [facility] OR;  Service: Thoracic;  Laterality: N/A;    Social History:  reports that she has never smoked. She has never used smokeless tobacco. She reports that she does not drink alcohol and does not use drugs.   Allergies  Allergen Reactions   Aspirin Palpitations    Can tolerate 81 mg    Family History  Problem Relation Age of Onset   Cancer Maternal Aunt        throat,      Prior to Admission medications   Medication Sig Start Date End Date Taking? Authorizing Provider  acetaminophen (TYLENOL) 500 MG tablet Take 1,000 mg by mouth every 6 (six) hours as needed for  mild pain or moderate pain. For pain   Yes [provider]  allopurinol (ZYLOPRIM) 100 MG tablet Take 100 mg by mouth daily. 04/13/19  Yes [provider]  aspirin EC 81 MG tablet Take 1 tablet (81 mg total) by mouth daily. 07/30/21  Yes Rehman, Joline Maxcy, MD  carvedilol (COREG) 3.125 MG tablet TAKE 1 TABLET BY MOUTH TWICE A DAY 09/05/23  Yes Hilty, Lisette Abu, MD  Cyanocobalamin (B-12 PO) Place 1,000 mcg under the tongue daily.   Yes [provider]  diphenoxylate-atropine (LOMOTIL) 2.5-0.025 MG tablet Take 1 tablet by mouth 4 (four) times daily as needed for diarrhea or loose stools.   Yes [provider]  esomeprazole (NEXIUM) 40 MG capsule Take 40 mg by mouth every evening. 07/16/11  Yes Rehman, Joline Maxcy, MD  fexofenadine (ALLEGRA) 180 MG tablet Take 180 mg by mouth daily as needed for allergies.   Yes [provider]  hydrochlorothiazide (HYDRODIURIL) 12.5 MG tablet Take 1  tablet (12.5 mg total) by mouth daily. 09/27/23  Yes Hilty, Lisette Abu, MD  isosorbide mononitrate (IMDUR) 30 MG 24 hr tablet Take 1 1/2 tablets by mouth in the morning and at bedtime. 08/31/23  Yes Hilty, Lisette Abu, MD  linagliptin (TRADJENTA) 5 MG TABS tablet Take 5 mg by mouth daily after breakfast.   Yes [provider]  meclizine (ANTIVERT) 25 MG tablet Take 25 mg by mouth 2 (two) times daily. 07/30/20  Yes [provider]  MITIGARE 0.6 MG CAPS Take 1 capsule by mouth daily. 04/13/23  Yes [provider]  Multiple Vitamins-Minerals (WOMENS MULTIVITAMIN PO) Take 1 tablet by mouth daily.   Yes [provider]  nitroGLYCERIN (NITROSTAT) 0.4 MG SL tablet Place 1 tablet (0.4 mg total) under the tongue every 5 (five) minutes as needed for chest pain. 09/29/20 09/28/23 Yes Hilty, Lisette Abu, MD  potassium chloride SA (KLOR-CON M20) 20 MEQ tablet Take 1 tablet (20 mEq total) by mouth daily. 11/22/16  Yes Rana Snare, NP  ramipril (ALTACE) 5 MG capsule Take 5 mg  by mouth every evening.   Yes [provider]  simvastatin (ZOCOR) 40 MG tablet Take 40 mg by mouth at bedtime.   Yes [provider]  Blood Glucose Monitoring Suppl (ONETOUCH VERIO REFLECT) w/Device KIT USE TO CHECK BLOOD GLUCOSE 3 TIMES DAILY BEFORE MEALS 08/13/21   [provider]  traZODone (DESYREL) 50 MG tablet Take 50 mg by mouth at bedtime. Patient not taking: Reported on 09/28/2023 09/05/23   [provider]    Physical Exam: BP (!) 158/83   Pulse 84   Temp 98.6 F (37 C) (Oral)   Resp 16   Ht 5\' 2"  (1.575 m)   Wt 95 kg   SpO2 95%   BMI 38.31 kg/m   General: 83 y.o. year-old female well developed well nourished in no acute distress.  Alert and oriented x3. HEENT: NCAT, EOMI Neck: Supple, trachea medial Cardiovascular: Regular rate and rhythm with no rubs or gallops.  No thyromegaly or JVD noted.  No lower extremity edema. 2/4 pulses in all 4 extremities. Respiratory: Clear to auscultation with no wheezes or rales. Good inspiratory effort. Abdomen: Soft, nontender nondistended with normal bowel sounds x4 quadrants. Muskuloskeletal: No cyanosis, clubbing or edema noted bilaterally Neuro: CN II-XII intact, strength 5/5 x 4, sensation, reflexes intact Skin: No ulcerative lesions noted or rashes Psychiatry: Judgement and insight appear normal. Mood is appropriate for condition and setting          Labs on Admission:  Basic Metabolic Panel: Recent Labs  Lab 09/28/23 1848  NA 138  K 3.9  CL 100  CO2 28  GLUCOSE 120*  BUN 13  CREATININE 1.04*  CALCIUM 8.9   Liver Function Tests: Recent Labs  Lab 09/28/23 1848  AST 25  ALT 19  ALKPHOS 79  BILITOT 0.3  PROT 7.6  ALBUMIN 3.7   No results for input(s): "LIPASE", "AMYLASE" in the last 168 hours. No results for input(s): "AMMONIA" in the last 168 hours. CBC: Recent Labs  Lab 09/28/23 1848 09/29/23 0531  WBC 4.9 5.1  NEUTROABS 2.3  --   HGB 12.8 13.1  HCT 39.8 40.3  MCV  84.9 84.1  PLT 166 150   Cardiac Enzymes: No results for input(s): "CKTOTAL", "CKMB", "CKMBINDEX", "TROPONINI" in the last 168 hours.  BNP (last 3 results) No results for input(s): "BNP" in the last 8760 hours.  ProBNP (last 3 results) No results for  input(s): "PROBNP" in the last 8760 hours.  CBG: No results for input(s): "GLUCAP" in the last 168 hours.  Radiological Exams on Admission: CT Angio Chest PE W/Cm &/Or Wo Cm Result Date: 09/28/2023 CLINICAL DATA:  Pulmonary embolism (PE) suspected, high prob. Chest heaviness. EXAM: CT ANGIOGRAPHY CHEST WITH CONTRAST TECHNIQUE: Multidetector CT imaging of the chest was performed using the standard protocol during bolus administration of intravenous contrast. Multiplanar CT image reconstructions and MIPs were obtained to evaluate the vascular anatomy. RADIATION DOSE REDUCTION: This exam was performed according to the departmental dose-optimization program which includes automated exposure control, adjustment of the mA and/or kV according to patient size and/or use of iterative reconstruction technique. CONTRAST:  75mL OMNIPAQUE IOHEXOL 350 MG/ML SOLN COMPARISON:  01/19/2021 FINDINGS: Cardiovascular: No filling defects in the pulmonary arteries to suggest pulmonary emboli. Heart is normal size. Aorta is normal caliber. Scattered coronary artery and aortic atherosclerosis. Mediastinum/Nodes: No mediastinal, hilar, or axillary adenopathy. Trachea and esophagus are unremarkable. Thyroid diffusely enlarged, but stable since prior study with no visible focal abnormality. Small hiatal hernia. Lungs/Pleura: No confluent airspace opacities or effusions. Upper Abdomen: Imaging into the upper abdomen demonstrates no acute findings. Musculoskeletal: Chest wall soft tissues are unremarkable. No acute bony abnormality. Review of the MIP images confirms the above findings. IMPRESSION: No evidence of pulmonary embolus. No acute cardiopulmonary disease. Scattered coronary  artery disease. Aortic Atherosclerosis (ICD10-I70.0). Electronically Signed   By: Charlett Nose M.D.   On: 09/28/2023 20:10   DG Chest 2 View Result Date: 09/28/2023 CLINICAL DATA:  Chest pain since Monday EXAM: CHEST - 2 VIEW COMPARISON:  09/30/2020 FINDINGS: Frontal and lateral views of the chest demonstrate an unremarkable cardiac silhouette. No airspace disease, effusion, or pneumothorax. No acute bony abnormalities. IMPRESSION: 1. No acute intrathoracic process. Electronically Signed   By: Sharlet Salina M.D.   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 Cherly Beach was consulted and recommended increasing Imdur to 120 mg daily (from 90 mg daily). Cardiology will be consulted to help decide if Stress test is needed in am Versus other  diagnostic modalities.    Continue aspirin, nitroglycerin prn  Essential hypertension Continue Coreg, ramipril, Imdur  Mixed hyperlipidemia Continue Zocor  Type 2 diabetes mellitus with obesity Continue ISS and hypoglycemia protocol  GERD Continue Protonix  Obesity (BMI 38.31) Diet and lifestyle modification  Colon carcinoma with metastatic to lung Patient follows with Dr.  Myrle Sheng with last visit being 08/10/2023  DVT prophylaxis: Lovenox  Code Status: Full code  Family Communication: None at bedside  Consults: Cardiology  Severity of Illness: The appropriate patient status for this patient is OBSERVATION. Observation status is judged to be reasonable and necessary in order to  provide the required intensity of service to ensure the patient's safety. The patient's presenting symptoms, physical exam findings, and initial radiographic and laboratory data in the context of their medical condition is felt to place them at decreased risk for further clinical deterioration. Furthermore, it is anticipated that the patient will be medically stable for discharge from the hospital within 2 midnights of admission.   Author: Frankey Shown, DO 09/29/2023 6:04 AM  For on call review [email or web address].

## 2023-09-29 NOTE — Discharge Summary (Signed)
 Physician Discharge Summary  Peggy French:[id number] DOB: [birth year] DOA: 09/28/2023  PCP: Mirna Mires, MD  Admit date: 09/28/2023 Discharge date: 09/29/2023  Admitted From: Home Disposition: Home  Recommendations for Outpatient Follow-up:  Follow up with PCP in 1-2 weeks Follow-up with cardiology as scheduled  Home Health: None Equipment/Devices: None  Discharge Condition: Stable CODE STATUS: Full Diet recommendation: Low-salt low-fat low-carb diet  Brief/Interim Summary: Peggy French is an 83 y.o. female with medical history significant of T2DM, GERD, hypertension, hyperlipidemia, remote history of colon cancer, Obesity who presents to the emergency department due to 1 week onset of intermittent exertional chest pain that improves with rest and sublingual nitroglycerin.   Patient admitted as above with atypical chest pain but notable risk factors given her history.  Cardiology consulted at intake and we appreciate their insight recommendations.  Given patient's unremarkable echo for further discussed with cardiology unlikely any further inpatient workup necessary at this time and would recommend outpatient follow-up.  Will increase patient's Imdur and discontinue HCTZ to ensure against hypotension.  Patient otherwise stable and agreeable for discharge home.   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)   Type 2 diabetes mellitus with obesity (HCC)   Obesity (BMI 30-39.9)   Mixed hyperlipidemia   GERD (gastroesophageal reflux disease)  Atypical Chest Pain Cardiovascular risk factors include hypertension, hyperlipidemia,T2DM, Obesity Troponin unremarkable, low, flat EKG showed normal sinus rhythm at a rate of 91 bpm Echo without acute findings Increased Imdur dose as above   Essential hypertension Continue Coreg, ramipril, Imdur   Mixed hyperlipidemia Continue  Zocor   Type 2 diabetes mellitus with obesity, uncontrolled with hyperglycemia -Recommend close monitoring of diet, recommend follow-up with PCP in the next 1 to 2 weeks -A1c 7.6 -May benefit from discussion in regards to insulin if A1c cannot be corrected with diet and p.o. medications alone.   GERD Continue Protonix   Obesity (BMI 38.31) Diet and lifestyle modification discussed at length given risk factors of above   Colon carcinoma with metastatic to lung  Discharge Instructions   Allergies as of 09/29/2023       Reactions   Aspirin Palpitations   Can tolerate 81 mg        Medication List     STOP taking these medications    hydrochlorothiazide 12.5 MG tablet Commonly known as: HYDRODIURIL       TAKE these medications    acetaminophen 500 MG tablet Commonly known as: TYLENOL Take 1,000 mg by mouth every 6 (six) hours as needed for mild pain or moderate pain. For pain   allopurinol 100 MG tablet Commonly known as: ZYLOPRIM Take 100 mg by mouth daily.   aspirin EC 81 MG tablet Take 1 tablet (81 mg total) by mouth daily.   B-12 PO Place 1,000 mcg under the tongue daily.   carvedilol 3.125 MG tablet Commonly known as: COREG TAKE 1 TABLET BY MOUTH TWICE A DAY   diphenoxylate-atropine 2.5-0.025 MG tablet Commonly known as: LOMOTIL Take 1 tablet by mouth 4 (four) times daily as needed for diarrhea or loose stools.   esomeprazole 40 MG capsule Commonly known as: NEXIUM Take 40 mg by mouth every evening.   fexofenadine 180 MG tablet Commonly known as: ALLEGRA Take 180 mg by mouth daily as needed for allergies.   isosorbide mononitrate 60 MG 24 hr tablet Commonly known as: IMDUR Take 1 tablet (60  mg total) by mouth 2 (two) times daily. What changed:  medication strength how much to take how to take this when to take this additional instructions   linagliptin 5 MG Tabs tablet Commonly known as: TRADJENTA Take 5 mg by mouth daily after  breakfast.   meclizine 25 MG tablet Commonly known as: ANTIVERT Take 25 mg by mouth 2 (two) times daily.   Mitigare 0.6 MG Caps Generic drug: Colchicine Take 1 capsule by mouth daily.   nitroGLYCERIN 0.4 MG SL tablet Commonly known as: NITROSTAT Place 1 tablet (0.4 mg total) under the tongue every 5 (five) minutes as needed for chest pain.   OneTouch Verio Reflect w/Device Kit USE TO CHECK BLOOD GLUCOSE 3 TIMES DAILY BEFORE MEALS   potassium chloride SA 20 MEQ tablet Commonly known as: Klor-Con M20 Take 1 tablet (20 mEq total) by mouth daily.   ramipril 5 MG capsule Commonly known as: ALTACE Take 5 mg by mouth every evening.   simvastatin 40 MG tablet Commonly known as: ZOCOR Take 40 mg by mouth at bedtime.   traZODone 50 MG tablet Commonly known as: DESYREL Take 50 mg by mouth at bedtime.   WOMENS MULTIVITAMIN PO Take 1 tablet by mouth daily.        Allergies  Allergen Reactions   Aspirin Palpitations    Can tolerate 81 mg    Consultations: Cardiology  Procedures/Studies: ECHOCARDIOGRAM COMPLETE Result Date: 09/29/2023    ECHOCARDIOGRAM REPORT   Patient Name:   Peggy French Date of Exam: 09/29/2023 Medical Rec #:  [id number]        Height:       62.0 in Accession #:    [id number]       Weight:       209.4 lb Date of Birth:  1941-03-17       BSA:          1.950 m Patient Age:    82 years         BP:           133/71 mmHg Patient Gender: F                HR:           78 bpm. [facility] Procedure: 2D Echo, Cardiac Doppler, Color Doppler and Intracardiac            Opacification Agent (Both Spectral and Color Flow Doppler were            utilized during procedure). Indications:    Chest Pain R07.9  History:        Patient has prior history of Echocardiogram examinations, most                 recent 09/30/2020. CAD; Risk Factors:Hypertension, Diabetes,                 Dyslipidemia, Non-Smoker and Obesity.  Sonographer:    Celesta Gentile RCS Referring  Phys: [id number] Ellsworth Lennox  Sonographer Comments: Technically difficult study due to poor echo windows. IMPRESSIONS  1. Left ventricular ejection fraction, by estimation, is 60 to 65%. The left ventricle has normal function. The left ventricle has no regional wall motion abnormalities. There is mild asymmetric left ventricular hypertrophy of the basal segment. Left ventricular diastolic parameters are consistent with Grade I diastolic dysfunction (impaired relaxation).  2. Right ventricular systolic function is normal. The right ventricular size is normal. Tricuspid regurgitation signal is inadequate for assessing  PA pressure.  3. The mitral valve is grossly normal. Trivial mitral valve regurgitation.  4. The aortic valve has an indeterminant number of cusps. There is moderate calcification of the aortic valve with restricted noncoronary cusp. Aortic valve regurgitation is not visualized. Mild to moderate aortic valve stenosis, paradoxical normal flow/low gradient. Aortic valve mean gradient measures 10.0 mmHg. Aortic valve Vmax measures 2.22 m/s. Dimentionless index 0.41.  5. The inferior vena cava is normal in size with greater than 50% respiratory variability, suggesting right atrial pressure of 3 mmHg. Comparison(s): Prior images reviewed side by side. LVEF remains normal range at 60-65%. Mild to moderate aortic stenosis. FINDINGS  Left Ventricle: Left ventricular ejection fraction, by estimation, is 60 to 65%. The left ventricle has normal function. The left ventricle has no regional wall motion abnormalities. Definity contrast agent was given IV to delineate the left ventricular  endocardial borders. The left ventricular internal cavity size was normal in size. There is mild asymmetric left ventricular hypertrophy of the basal segment. Left ventricular diastolic parameters are consistent with Grade I diastolic dysfunction (impaired relaxation). Right Ventricle: The right ventricular size is normal. No  increase in right ventricular wall thickness. Right ventricular systolic function is normal. Tricuspid regurgitation signal is inadequate for assessing PA pressure. Left Atrium: Left atrial size was normal in size. Right Atrium: Right atrial size was normal in size. Pericardium: There is no evidence of pericardial effusion. Presence of epicardial fat layer. Mitral Valve: The mitral valve is grossly normal. Trivial mitral valve regurgitation. Tricuspid Valve: The tricuspid valve is grossly normal. Tricuspid valve regurgitation is trivial. Aortic Valve: The aortic valve has an indeterminant number of cusps. There is moderate calcification of the aortic valve. There is mild aortic valve annular calcification. Aortic valve regurgitation is not visualized. Mild to moderate aortic stenosis is present. Aortic valve mean gradient measures 10.0 mmHg. Aortic valve peak gradient measures 19.7 mmHg. Aortic valve area, by VTI measures 0.93 cm. Pulmonic Valve: The pulmonic valve was grossly normal. Pulmonic valve regurgitation is trivial. Aorta: The aortic root is normal in size and structure. Venous: The inferior vena cava is normal in size with greater than 50% respiratory variability, suggesting right atrial pressure of 3 mmHg. IAS/Shunts: No atrial level shunt detected by color flow Doppler. Additional Comments: 3D was performed not requiring image post processing on an independent workstation and was indeterminate.  LEFT VENTRICLE PLAX 2D LVIDd:         4.20 cm   Diastology LVIDs:         2.70 cm   LV e' medial:    5.66 cm/s LV PW:         1.00 cm   LV E/e' medial:  10.2 LV IVS:        1.20 cm   LV e' lateral:   7.94 cm/s LVOT diam:     1.70 cm   LV E/e' lateral: 7.3 LV SV:         47 LV SV Index:   24 LVOT Area:     2.27 cm  RIGHT VENTRICLE RV S prime:     9.14 cm/s TAPSE (M-mode): 1.8 cm LEFT ATRIUM           Index        RIGHT ATRIUM          Index LA diam:      3.10 cm 1.59 cm/m   RA Area:     9.85 cm LA Vol (A2C):  32.2 ml  16.52 ml/m  RA Volume:   19.10 ml 9.80 ml/m LA Vol (A4C): 42.4 ml 21.75 ml/m  AORTIC VALVE AV Area (Vmax):    0.96 cm AV Area (Vmean):   1.04 cm AV Area (VTI):     0.93 cm AV Vmax:           222.00 cm/s AV Vmean:          148.000 cm/s AV VTI:            0.504 m AV Peak Grad:      19.7 mmHg AV Mean Grad:      10.0 mmHg LVOT Vmax:         94.00 cm/s LVOT Vmean:        67.900 cm/s LVOT VTI:          0.207 m LVOT/AV VTI ratio: 0.41  AORTA Ao Root diam: 2.90 cm MITRAL VALVE MV Area (PHT): 2.22 cm    SHUNTS MV Decel Time: 342 msec    Systemic VTI:  0.21 m MV E velocity: 57.80 cm/s  Systemic Diam: 1.70 cm MV A velocity: 99.40 cm/s MV E/A ratio:  0.58 Nona Dell MD Electronically signed by Nona Dell MD Signature Date/Time: 09/29/2023/6:03:58 PM    Final    CT Angio Chest PE W/Cm &/Or Wo Cm Result Date: 09/28/2023 CLINICAL DATA:  Pulmonary embolism (PE) suspected, high prob. Chest heaviness. EXAM: CT ANGIOGRAPHY CHEST WITH CONTRAST TECHNIQUE: Multidetector CT imaging of the chest was performed using the standard protocol during bolus administration of intravenous contrast. Multiplanar CT image reconstructions and MIPs were obtained to evaluate the vascular anatomy. RADIATION DOSE REDUCTION: This exam was performed according to the departmental dose-optimization program which includes automated exposure control, adjustment of the mA and/or kV according to patient size and/or use of iterative reconstruction technique. CONTRAST:  75mL OMNIPAQUE IOHEXOL 350 MG/ML SOLN COMPARISON:  01/19/2021 FINDINGS: Cardiovascular: No filling defects in the pulmonary arteries to suggest pulmonary emboli. Heart is normal size. Aorta is normal caliber. Scattered coronary artery and aortic atherosclerosis. Mediastinum/Nodes: No mediastinal, hilar, or axillary adenopathy. Trachea and esophagus are unremarkable. Thyroid diffusely enlarged, but stable since prior study with no visible focal abnormality. Small hiatal  hernia. Lungs/Pleura: No confluent airspace opacities or effusions. Upper Abdomen: Imaging into the upper abdomen demonstrates no acute findings. Musculoskeletal: Chest wall soft tissues are unremarkable. No acute bony abnormality. Review of the MIP images confirms the above findings. IMPRESSION: No evidence of pulmonary embolus. No acute cardiopulmonary disease. Scattered coronary artery disease. Aortic Atherosclerosis (ICD10-I70.0). Electronically Signed   By: Charlett Nose M.D.   On: 09/28/2023 20:10   DG Chest 2 View Result Date: 09/28/2023 CLINICAL DATA:  Chest pain since Monday EXAM: CHEST - 2 VIEW COMPARISON:  09/30/2020 FINDINGS: Frontal and lateral views of the chest demonstrate an unremarkable cardiac silhouette. No airspace disease, effusion, or pneumothorax. No acute bony abnormalities. IMPRESSION: 1. No acute intrathoracic process. Electronically Signed   By: Sharlet Salina M.D.   On: 09/28/2023 17:46     Subjective: No acute issues or events overnight, chest pain resolved otherwise denies nausea vomiting diarrhea constipation headache fevers chills or shortness of breath   Discharge Exam: Vitals:   09/29/23 1700 09/29/23 1815  BP: (!) 149/68 (!) 153/88  Pulse: 86 86  Resp: 18 15  Temp:    SpO2: 96% 96%   Vitals:   09/29/23 1400 09/29/23 1500 09/29/23 1700 09/29/23 1815  BP: 131/66 133/71 (!) 149/68 (!) 153/88  Pulse: 85 78  86 86  Resp: 18  18 15   Temp:      TempSrc:      SpO2: 94% 95% 96% 96%  Weight:      Height:        General: Pt is alert, awake, not in acute distress Cardiovascular: RRR, S1/S2 +, no rubs, no gallops Respiratory: CTA bilaterally, no wheezing, no rhonchi Abdominal: Soft, NT, ND, bowel sounds + Extremities: no edema, no cyanosis    The results of significant diagnostics from this hospitalization (including imaging, microbiology, ancillary and laboratory) are listed below for reference.     Microbiology: No results found for this or any  previous visit (from the past 240 hours).   Labs: BNP (last 3 results) No results for input(s): "BNP" in the last 8760 hours. Basic Metabolic Panel: Recent Labs  Lab 09/28/23 1848 09/29/23 0531  NA 138 139  K 3.9 3.8  CL 100 103  CO2 28 27  GLUCOSE 120* 134*  BUN 13 11  CREATININE 1.04* 0.95  CALCIUM 8.9 9.0  MG  --  1.6*  PHOS  --  3.8   Liver Function Tests: Recent Labs  Lab 09/28/23 1848 09/29/23 0531  AST 25 25  ALT 19 18  ALKPHOS 79 77  BILITOT 0.3 0.4  PROT 7.6 7.1  ALBUMIN 3.7 3.5   CBC: Recent Labs  Lab 09/28/23 1848 09/29/23 0531  WBC 4.9 5.1  NEUTROABS 2.3  --   HGB 12.8 13.1  HCT 39.8 40.3  MCV 84.9 84.1  PLT 166 150    Urinalysis    Component Value Date/Time   COLORURINE YELLOW 03/23/2013 1502   APPEARANCEUR CLOUDY (A) 03/23/2013 1502   LABSPEC 1.017 03/23/2013 1502   PHURINE 6.0 03/23/2013 1502   GLUCOSEU NEGATIVE 03/23/2013 1502   HGBUR NEGATIVE 03/23/2013 1502   BILIRUBINUR NEGATIVE 03/23/2013 1502   KETONESUR NEGATIVE 03/23/2013 1502   PROTEINUR NEGATIVE 03/23/2013 1502   UROBILINOGEN 1.0 03/23/2013 1502   NITRITE NEGATIVE 03/23/2013 1502   LEUKOCYTESUR MODERATE (A) 03/23/2013 1502   Sepsis Labs Recent Labs  Lab 09/28/23 1848 09/29/23 0531  WBC 4.9 5.1   Microbiology No results found for this or any previous visit (from the past 240 hours).   Time coordinating discharge: Over 30 minutes  SIGNED:   Azucena Fallen, DO [facility] 09/29/2023, 7:35 PM Pager   If 7PM-7AM, please contact night-coverage [email or web address]

## 2023-10-05 LAB — CBG MONITORING, ED: Glucose-Capillary: 116 mg/dL — ABNORMAL HIGH (ref 70–99)

## 2023-10-17 ENCOUNTER — Other Ambulatory Visit: Payer: Self-pay | Admitting: Internal Medicine

## 2023-10-31 ENCOUNTER — Other Ambulatory Visit: Payer: Self-pay | Admitting: Internal Medicine

## 2023-11-01 ENCOUNTER — Other Ambulatory Visit: Payer: Self-pay | Admitting: Internal Medicine

## 2023-11-08 ENCOUNTER — Telehealth: Payer: Self-pay | Admitting: Internal Medicine

## 2023-11-08 NOTE — Telephone Encounter (Signed)
 Pt c/o medication issue:  1. Name of Medication:   hydrochlorothiazide  (HYDRODIURIL ) 12.5 MG tablet   2. How are you currently taking this medication (dosage and times per day)?   Not taking  3. Are you having a reaction (difficulty breathing--STAT)?   4. What is your medication issue?   Patient called to report she is out of this medication since leaving hospital.  Patient stated her doctor did not want her taking this medication anyway as she is taking other medication.

## 2023-11-08 NOTE — Telephone Encounter (Signed)
 Spoke with Peggy French regarding her prescription for hydrochlorothiazide . Peggy French stated she is out of the medication and experiencing swelling in her lower extremities. Peggy French last saw [place] in 2022 and [place], [place] in 2023. Peggy French stated she saw her primary care provider yesterday who prescribed a "fluid pill" but has not picked it up yet and does not know the name of the medication. Peggy French is wondering if she still needs a prescription for hydrochlorothiazide . Peggy French was told the information provided would be forwarded to her provider for advise. Peggy French verbalized understanding. All questions if any were answered.

## 2023-11-11 ENCOUNTER — Other Ambulatory Visit: Payer: Self-pay

## 2023-11-11 DIAGNOSIS — I70213 Atherosclerosis of native arteries of extremities with intermittent claudication, bilateral legs: Secondary | ICD-10-CM

## 2023-11-14 NOTE — Progress Notes (Unsigned)
 Patient name: Peggy French MRN: [id number] DOB: 22-Apr-1941 Sex: female  REASON FOR CONSULT: PVD with leg pain  HPI: Peggy French is a 83 y.o. female, with history of diabetes, hypertension, hyperlipidemia, CAD, colon cancer with metastasis to the lung with previous lobectomy presents for evaluation of leg pain and evaluation of PVD.  Patient states both her legs have been bothering her since last month.  She states this is at the ankles into the feet and toes.  States it is a tingling feeling.  Worse at night.  Does not bother her as much when she is walking.  No prior vascular inventions.  No tissue loss or wounds.   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 s/p right hemicolectomy/chemo 2013 with mets to the lung s/p LLL wedge resection 2014.   Diabetes mellitus    x over 10 yrs   GERD (gastroesophageal reflux disease)    Gout    H/O hiatal hernia    History of colon cancer    History of shingles    Hyperlipidemia    Hypertension    EKG 10/12 [facility], chest- 1 view  6/13 [facility]    Last 2D Echo on 05/02/2012 showed EF of greater than 55%   Microcytic anemia    Mild CAD    a. cath by Dr. Maximo Spar in 03/2011 - 20-30% ostial bifurcation stenosis of LAD, otherwise only mild luminal irregularities in LAD/RCA, LVEF 60%, no RWMA.   c. 06/18/16 LHC after abnormal nuc showed mild non obst CAD   Neuropathy    Obesity     Past Surgical History:  Procedure Laterality Date   ABDOMINAL HYSTERECTOMY     APPENDECTOMY     CARDIAC CATHETERIZATION  2001,2012   CARDIAC CATHETERIZATION N/A 06/18/2016   Procedure: Left Heart Cath and Coronary Angiography;  Surgeon: Sammy Crisp, MD;  Location: [facility] INVASIVE CV LAB;  Service: Cardiovascular;  Laterality: N/A;   CATARACT EXTRACTION W/PHACO Left 02/17/2015   Procedure: CATARACT EXTRACTION PHACO AND INTRAOCULAR LENS PLACEMENT (IOC);  Surgeon: Anner Kill, MD;  Location:  [facility];  Service: Ophthalmology;  Laterality: Left;  CDE:8.01    CATARACT EXTRACTION W/PHACO Right 08/03/2021   Procedure: CATARACT EXTRACTION PHACO AND INTRAOCULAR LENS PLACEMENT (IOC);  Surgeon: Tarri Farm, MD;  Location: [facility];  Service: Ophthalmology;  Laterality: Right;  CDE: 13.97   CHOLECYSTECTOMY     COLON SURGERY     PARTIAL COLECTOMY 30 YRS / AGAIN 2013   COLONOSCOPY  11/26/2011   Procedure: COLONOSCOPY;  Surgeon: Ruby Corporal, MD;  Location: [facility];  Service: Endoscopy;  Laterality: N/A;  2:15   COLONOSCOPY N/A 12/08/2012   Procedure: COLONOSCOPY;  Surgeon: Ruby Corporal, MD;  Location: [facility];  Service: Endoscopy;  Laterality: N/A;  815-moved to 0950 Ann to notify pt   COLONOSCOPY N/A 03/25/2016   Procedure: COLONOSCOPY;  Surgeon: Ruby Corporal, MD;  Location: [facility];  Service: Endoscopy;  Laterality: N/A;  1200   COLONOSCOPY WITH PROPOFOL  N/A 07/29/2021   Procedure: COLONOSCOPY WITH PROPOFOL ;  Surgeon: Ruby Corporal, MD;  Location: [facility];  Service: Endoscopy;  Laterality: N/A;  11:10   ESOPHAGOGASTRODUODENOSCOPY  06/16/2011   Procedure: ESOPHAGOGASTRODUODENOSCOPY (EGD);  Surgeon: Ruby Corporal, MD;  Location: [facility];  Service: Endoscopy;  Laterality: N/A;  2:15   POLYPECTOMY  07/29/2021   Procedure: POLYPECTOMY;  Surgeon: Ruby Corporal, MD;  Location: [facility];  Service: Endoscopy;;  transverse colon   PORT-A-CATH REMOVAL N/A 06/04/2014   Procedure: REMOVAL PORT-A-CATH;  Surgeon: Azucena Bollard, MD;  Location: [facility];  Service: General;  Laterality: N/A;   PORTACATH PLACEMENT  02/22/2012   Procedure: INSERTION PORT-A-CATH;  Surgeon: Azucena Bollard, MD;  Location: [facility];  Service: General;  Laterality: N/A;  left subclavian   VIDEO ASSISTED THORACOSCOPY (VATS)/WEDGE RESECTION Left 03/27/2013   Procedure: VIDEO ASSISTED THORACOSCOPY (VATS)/WEDGE RESECTION;  Surgeon: Norita Beauvais, MD;  Location: [facility] OR;  Service: Thoracic;   Laterality: Left;   VIDEO BRONCHOSCOPY N/A 03/27/2013   Procedure: VIDEO BRONCHOSCOPY;  Surgeon: Norita Beauvais, MD;  Location: [facility] OR;  Service: Thoracic;  Laterality: N/A;    Family History  Problem Relation Age of Onset   Cancer Maternal Aunt        throat,     SOCIAL HISTORY: Social History   Socioeconomic History   Marital status: Married    Spouse name: Not on file   Number of children: 4   Years of education: Not on file   Highest education level: Not on file  Occupational History    Employer: RETIRED  Tobacco Use   Smoking status: Never   Smokeless tobacco: Never  Vaping Use   Vaping status: Never Used  Substance and Sexual Activity   Alcohol  use: No   Drug use: No   Sexual activity: Not Currently  Other Topics Concern   Not on file  Social History Narrative   Married to husband, Porfirio Bristol   Retired Scientist, product/process development   Social Drivers of Corporate investment banker Strain: Not on file  Food Insecurity: No Food Insecurity (09/28/2023)   Hunger Vital Sign    Worried About Running Out of Food in the Last Year: Never true    Ran Out of Food in the Last Year: Never true  Transportation Needs: No Transportation Needs (09/28/2023)   PRAPARE - Administrator, Civil Service (Medical): No    Lack of Transportation (Non-Medical): No  Physical Activity: Not on file  Stress: Not on file  Social Connections: Moderately Integrated (09/28/2023)   Social Connection and Isolation Panel [NHANES]    Frequency of Communication with Friends and Family: More than three times a week    Frequency of Social Gatherings with Friends and Family: More than three times a week    Attends Religious Services: More than 4 times per year    Active Member of [employer] or Organizations: Yes    Attends Banker Meetings: More than 4 times per year    Marital Status: Widowed  Intimate Partner Violence: Not At Risk (09/28/2023)   Humiliation, Afraid, Rape, and Kick questionnaire     Fear of Current or Ex-Partner: No    Emotionally Abused: No    Physically Abused: No    Sexually Abused: No    Allergies  Allergen Reactions   Aspirin  Palpitations    Can tolerate 81 mg    Current Outpatient Medications  Medication Sig Dispense Refill   acetaminophen  (TYLENOL ) 500 MG tablet Take 1,000 mg by mouth every 6 (six) hours as needed for mild pain or moderate pain. For pain     allopurinol  (ZYLOPRIM ) 100 MG tablet Take 100 mg by mouth daily.     aspirin  EC 81 MG tablet Take 1 tablet (81 mg total) by mouth daily. 30 tablet 11   Blood  Glucose Monitoring Suppl (ONETOUCH VERIO REFLECT) w/Device KIT USE TO CHECK BLOOD GLUCOSE 3 TIMES DAILY BEFORE MEALS     carvedilol  (COREG ) 3.125 MG tablet TAKE 1 TABLET BY MOUTH TWICE A DAY 180 tablet 0   Cyanocobalamin (B-12 PO) Place 1,000 mcg under the tongue daily.     diphenoxylate -atropine  (LOMOTIL ) 2.5-0.025 MG tablet Take 1 tablet by mouth 4 (four) times daily as needed for diarrhea or loose stools.     esomeprazole  (NEXIUM ) 40 MG capsule Take 40 mg by mouth every evening.     fexofenadine (ALLEGRA) 180 MG tablet Take 180 mg by mouth daily as needed for allergies.     isosorbide  mononitrate (IMDUR ) 60 MG 24 hr tablet Take 1 tablet (60 mg total) by mouth 2 (two) times daily. 60 tablet 0   linagliptin  (TRADJENTA ) 5 MG TABS tablet Take 5 mg by mouth daily after breakfast.     meclizine  (ANTIVERT ) 25 MG tablet Take 25 mg by mouth 2 (two) times daily.     MITIGARE  0.6 MG CAPS Take 1 capsule by mouth daily.     Multiple Vitamins-Minerals (WOMENS MULTIVITAMIN PO) Take 1 tablet by mouth daily.     nitroGLYCERIN  (NITROSTAT ) 0.4 MG SL tablet Place 1 tablet (0.4 mg total) under the tongue every 5 (five) minutes as needed for chest pain. 25 tablet 3   potassium chloride  SA (KLOR-CON  M20) 20 MEQ tablet Take 1 tablet (20 mEq total) by mouth daily. 30 tablet 2   ramipril  (ALTACE ) 5 MG capsule Take 5 mg by mouth every evening.     simvastatin  (ZOCOR )  40 MG tablet Take 40 mg by mouth at bedtime.     traZODone (DESYREL) 50 MG tablet Take 50 mg by mouth at bedtime. (Patient not taking: Reported on 09/28/2023)     No current facility-administered medications for this visit.   Facility-Administered Medications Ordered in Other Visits  Medication Dose Route Frequency Provider Last Rate Last Admin   sodium chloride  0.9 % injection 10 mL  10 mL Intracatheter PRN Sumner Ends, MD        REVIEW OF SYSTEMS:  [X]  denotes positive finding, [ ]  denotes negative finding Cardiac  Comments:  Chest pain or chest pressure:    Shortness of breath upon exertion:    Short of breath when lying flat:    Irregular heart rhythm:        Vascular    Pain in calf, thigh, or hip brought on by ambulation:    Pain in feet at night that wakes you up from your sleep:     Blood clot in your veins:    Leg swelling:         Pulmonary    Oxygen at home:    Productive cough:     Wheezing:         Neurologic    Sudden weakness in arms or legs:     Sudden numbness in arms or legs:     Sudden onset of difficulty speaking or slurred speech:    Temporary loss of vision in one eye:     Problems with dizziness:         Gastrointestinal    Blood in stool:     Vomited blood:         Genitourinary    Burning when urinating:     Blood in urine:        Psychiatric    Major depression:  Hematologic    Bleeding problems:    Problems with blood clotting too easily:        Skin    Rashes or ulcers:        Constitutional    Fever or chills:      PHYSICAL EXAM: There were no vitals filed for this visit.  GENERAL: The patient is a well-nourished female, in no acute distress. The vital signs are documented above. CARDIAC: There is a regular rate and rhythm.  VASCULAR:  Bilateral femoral pulses palpable Bilateral DP pulses palpable No lower extremity tissue loss PULMONARY: No respiratory distress. ABDOMEN: Soft and non-tender. MUSCULOSKELETAL:  There are no major deformities or cyanosis. NEUROLOGIC: No focal weakness or paresthesias are detected. SKIN: There are no ulcers or rashes noted. PSYCHIATRIC: The patient has a normal affect.  DATA:   ABI today are 0.85 right biphasic and 1.16 left triphasic   Assessment/Plan:   83 y.o. female, with history of diabetes, hypertension, hyperlipidemia, CAD, colon cancer with metastasis to the lung with previous lobectomy presents for evaluation of leg pain and evaluation of PVD.  I discussed today that her ABI is normal on the left at 1.16 and only mildly abnormal on the right at 0.85.  She has palpable pedal pulses bilaterally.  I do not feel arterial insufficiency explains her symptoms.  I think her overall blood flow is actually quite good.  I discussed that this sounds like probably neuropathy given it bothers her more at night with a tingling sensation in both feet and her toes.  She can follow-up with me as needed.  No indication for vascular intervention at this time.  She has been treated with gabapentin  in the past for neuropathy and will defer back to her PCP to restart that medication.   Young Hensen, MD [facility] Office: [phone]

## 2023-11-15 ENCOUNTER — Ambulatory Visit

## 2023-11-15 ENCOUNTER — Ambulatory Visit: Admitting: Vascular Surgery

## 2023-11-15 ENCOUNTER — Encounter: Payer: Self-pay | Admitting: Vascular Surgery

## 2023-11-15 VITALS — BP 125/79 | HR 90 | Temp 97.9°F | Resp 18 | Ht 62.0 in | Wt 213.0 lb

## 2023-11-15 DIAGNOSIS — I739 Peripheral vascular disease, unspecified: Secondary | ICD-10-CM

## 2023-11-15 DIAGNOSIS — I70213 Atherosclerosis of native arteries of extremities with intermittent claudication, bilateral legs: Secondary | ICD-10-CM

## 2023-11-15 LAB — VAS US ABI WITH/WO TBI
Left ABI: 1.16
Right ABI: 0.85

## 2023-11-24 ENCOUNTER — Other Ambulatory Visit: Payer: Self-pay | Admitting: Internal Medicine

## 2023-11-27 NOTE — Progress Notes (Signed)
 Cardiology Office Note    Date:  11/30/2023  ID:  Peggy French, Peggy French 1940-10-08, MRN [id number] PCP:  Wilburn Handler, MD  Cardiologist:  Hazle Lites, MD  Electrophysiologist:  None   Chief Complaint: Follow up for CAD   History of Present Illness: Peggy French    Peggy French is a 83 y.o. female with visit-pertinent history of mild nonobstructive coronary artery disease by LHC in 2017, hypertension, hyperlipidemia, diabetes, obesity, GERD, colon cancer s/p colectomy, lung cancer s/p wedge resection.  On 06/18/2016 patient had LHC by Dr. Nolan Battle which demonstrated up to 30% mild to moderate nonobstructive disease, LVEDP was 5 mmHg, LVEF was 60 to 65%.  Is recommended that she go on a long-acting nocturia for possible small vessel coronary disease.  Patient was last seen by Dr. Maximo Spar on 01/14/2021, no medication changes recommended at time.  Patient was last seen in office by Phyliss Breen, PA on 11/11/2021, she reported she rarely had any angina.  She reported stable dyspnea on exertion.  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  Otherwise from a cardiac standpoint she remained stable.  On chart review on 09/28/2023 patient presented to [facility] ED with symptoms that seem consistent with stable angina.  Was noted that her presentation was similar to 2017 with subsequent coronary angiography with mild to moderate nonobstructive CAD and normal LVEF.  Patient reported chest pain for 1 week duration relieved with rest and occasional sublingual nitroglycerin .  ECG was reviewed and was without ischemic changes high sensitive troponin was negative x 2.  It was recommended that her long-acting nitrate be increased and her hydrochlorothiazide  be discontinued.  Patient was seen by Dr. Fulton Job with [facility] on 11/15/2023 for lower extremity discomfort and tingling at night.  Patient's ABIs were overall normal on the left at 1.16 and only mildly abnormal on the right at 0.85.  She had  palpable pedal pulses bilaterally, he did not feel arterial insufficiency explain her symptoms and felt that she likely had neuropathy.  It was recommended that she continue with treatment of gabapentin  and follow-up with him as needed.   Today she presents for follow-up.  She reports that she has been doing well. 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 She reports since discharge she had some accumulation of fluid as her hydrochlorothiazide  was discontinued, with this she had some slight chest discomfort that resolved in a few seconds with resting.  Patient notes that following this she was started on Lasix  by her PCP with significant improvement.  She denies any further chest pain, pressure or tightness.  She denies any increased shortness of breath, lower extremity edema, orthopnea or PND.  She denies any palpitations, presyncope or syncope.  She reports adherence with all medications.  She reports that her blood pressure at home is between 130-140 systolic, she notes that if she drops lower than 130 systolic she can become dizzy and lightheaded with position changes.  She denies any falls or feeling as though she may pass out.  Patient notes that her husband sadly passed away in 09-May-2024 and has had significant stressors at home which she feels contributed to her admission in March of this year.  Patient reports that since discharge she has overall felt well.  Labwork independently reviewed: Patient reports that she recently had lab work completed at her PCPs office, we will request recent lab work.  ROS: .   Today she denies chest pain, shortness of breath, lower extremity edema, fatigue, palpitations, melena, hematuria, hemoptysis, diaphoresis, weakness, presyncope, syncope, orthopnea, and PND.  All other systems are reviewed and otherwise negative. Studies Reviewed: Peggy French    EKG:  Patient deferred, reviewed EKG from 09/19/23, indicating  normal sinus rhthym at 91 bpm without significant ST/T wave changes.  CV Studies: Cardiac studies reviewed are outlined and summarized above. Otherwise please see EMR for full report. Cardiac Studies & Procedures   ______________________________________________________________________________________________ CARDIAC CATHETERIZATION  CARDIAC CATHETERIZATION 06/18/2016  Conclusion Conclusions: 1. Mild to moderate non-obstructive coronary artery disease of up to 30% involving the LAD, LCx, and RCA. 2. Low normal left ventricular filling pressure (LVEDP 5 mmHg). 3. Normal left ventricular contraction (LVEF 60-65%).  Recommendations: 1. Discontinue nitroglycerin  infusion and start isosorbide  mononitrate 30 mg daily for possible microvascular disease. Continue amlodipine  and propranolol . 2. IV hydration given low normal LVEDP. 3. Continue risk factor modification, including statin therapy.  Sammy Crisp, MD [facility] Pager: [phone]  Findings Coronary Findings Diagnostic  Dominance: Co-dominant  Left Main Vessel is large.  Left Anterior Descending Vessel is moderate in size. The vessel is tortuous.  First Diagonal Branch Vessel is small in size.  Second Diagonal Branch Vessel is small in size.  Third Diagonal Branch Vessel is small in size.  Ramus Intermedius Vessel is small.  Left Circumflex Vessel is large. There is mild the vessel.  First Obtuse Marginal Branch Vessel is small in size.  Second Obtuse Marginal Branch Vessel is moderate in size.  Third Obtuse Marginal Branch Vessel is moderate in size.  Right Coronary Artery Vessel is moderate in size. There is mild the vessel.  Right Posterior Descending Artery Vessel is moderate in size.  Intervention  No interventions have been documented.   STRESS TESTS  MYOCARDIAL PERFUSION IMAGING 06/13/2019  Narrative  Nuclear stress EF: 62%.  There was no ST segment deviation noted during  stress.  No T wave inversion was noted during stress.  This is a low risk study.  Normal perfusion. LVEF 62% with normal wall motion. This is a low risk study. No change compared to prior in 2017.   ECHOCARDIOGRAM  ECHOCARDIOGRAM COMPLETE 09/29/2023  Narrative ECHOCARDIOGRAM REPORT    Patient Name:   SHERY WAUNEKA Date of Exam: 09/29/2023 Medical Rec #:  [id number]        Height:       62.0 in Accession #:    [id number]       Weight:       209.4 lb Date of Birth:  01/19/1941       BSA:          1.950 m Patient Age:    82 years         BP:           133/71 mmHg Patient Gender: F                HR:           78 bpm. Exam Location:  [facility]  Procedure: 2D Echo, Cardiac Doppler, Color Doppler and Intracardiac Opacification Agent (Both Spectral and Color Flow Doppler were utilized during procedure).  Indications:    Chest Pain R07.9  History:        Patient has prior history of Echocardiogram examinations, most recent 09/30/2020. CAD; Risk Factors:Hypertension, Diabetes, Dyslipidemia, Non-Smoker and Obesity.  Sonographer:    Denese Finn RCS Referring Phys: [id number] Dorma Gash  Sonographer Comments: Technically difficult study due to poor echo windows. IMPRESSIONS   1. Left ventricular ejection fraction, by estimation, is 60 to 65%. The left ventricle has normal function. The left ventricle has no regional wall motion abnormalities. There is mild asymmetric left ventricular hypertrophy of the basal segment. Left ventricular diastolic parameters are consistent with Grade I diastolic dysfunction (impaired relaxation). 2. Right ventricular systolic function is normal. The right ventricular size is normal. Tricuspid regurgitation signal is inadequate for assessing PA pressure. 3. The mitral valve is grossly normal. Trivial mitral valve regurgitation. 4. The aortic valve has an indeterminant number of cusps. There is moderate calcification of the aortic valve with  restricted noncoronary cusp. Aortic valve regurgitation is not visualized. Mild to moderate aortic valve stenosis, paradoxical normal flow/low gradient. Aortic valve mean gradient measures 10.0 mmHg. Aortic valve Vmax measures 2.22 m/s. Dimentionless index 0.41. 5. The inferior vena cava is normal in size with greater than 50% respiratory variability, suggesting right atrial pressure of 3 mmHg.  Comparison(s): Prior images reviewed side by side. LVEF remains normal range at 60-65%. Mild to moderate aortic stenosis.  FINDINGS Left Ventricle: Left ventricular ejection fraction, by estimation, is 60 to 65%. The left ventricle has normal function. The left ventricle has no regional wall motion abnormalities. Definity  contrast agent was given IV to delineate the left ventricular endocardial borders. The left ventricular internal cavity size was normal in size. There is mild asymmetric left ventricular hypertrophy of the basal segment. Left ventricular diastolic parameters are consistent with Grade I diastolic dysfunction (impaired relaxation).  Right Ventricle: The right ventricular size is normal. No increase in right ventricular wall thickness. Right ventricular systolic function is normal. Tricuspid regurgitation signal is inadequate for assessing PA pressure.  Left Atrium: Left atrial size was normal in size.  Right Atrium: Right atrial size was normal in size.  Pericardium: There is no evidence of pericardial effusion. Presence of epicardial fat layer.  Mitral Valve: The mitral valve is grossly normal. Trivial mitral valve regurgitation.  Tricuspid Valve: The tricuspid valve is grossly normal. Tricuspid valve regurgitation is trivial.  Aortic Valve: The aortic valve has an indeterminant number of cusps. There is moderate calcification of the aortic valve. There is mild aortic valve annular calcification. Aortic valve regurgitation is not visualized. Mild to moderate aortic stenosis  is present. Aortic valve mean gradient measures 10.0 mmHg. Aortic valve peak gradient measures 19.7 mmHg. Aortic valve area, by VTI measures 0.93 cm.  Pulmonic Valve: The pulmonic valve was grossly normal. Pulmonic valve regurgitation is trivial.  Aorta: The aortic root is normal in size and structure.  Venous: The inferior vena cava is normal in size with greater than 50% respiratory variability, suggesting right atrial pressure of 3 mmHg.  IAS/Shunts: No atrial level shunt detected by color flow Doppler.  Additional Comments: 3D was performed not requiring image post processing on an independent workstation and was indeterminate.   LEFT VENTRICLE PLAX 2D LVIDd:         4.20 cm   Diastology LVIDs:         2.70 cm   LV e' medial:    5.66 cm/s LV PW:         1.00 cm   LV E/e' medial:  10.2 LV IVS:        1.20 cm   LV e' lateral:   7.94 cm/s LVOT diam:     1.70 cm   LV E/e' lateral: 7.3 LV SV:  47 LV SV Index:   24 LVOT Area:     2.27 cm   RIGHT VENTRICLE RV S prime:     9.14 cm/s TAPSE (M-mode): 1.8 cm  LEFT ATRIUM           Index        RIGHT ATRIUM          Index LA diam:      3.10 cm 1.59 cm/m   RA Area:     9.85 cm LA Vol (A2C): 32.2 ml 16.52 ml/m  RA Volume:   19.10 ml 9.80 ml/m LA Vol (A4C): 42.4 ml 21.75 ml/m AORTIC VALVE AV Area (Vmax):    0.96 cm AV Area (Vmean):   1.04 cm AV Area (VTI):     0.93 cm AV Vmax:           222.00 cm/s AV Vmean:          148.000 cm/s AV VTI:            0.504 m AV Peak Grad:      19.7 mmHg AV Mean Grad:      10.0 mmHg LVOT Vmax:         94.00 cm/s LVOT Vmean:        67.900 cm/s LVOT VTI:          0.207 m LVOT/AV VTI ratio: 0.41  AORTA Ao Root diam: 2.90 cm  MITRAL VALVE MV Area (PHT): 2.22 cm    SHUNTS MV Decel Time: 342 msec    Systemic VTI:  0.21 m MV E velocity: 57.80 cm/s  Systemic Diam: 1.70 cm MV A velocity: 99.40 cm/s MV E/A ratio:  0.58  Teddie Favre MD Electronically signed by Teddie Favre  MD Signature Date/Time: 09/29/2023/6:03:58 PM    Final          ______________________________________________________________________________________________       Current Reported Medications:.    Current Meds  Medication Sig   acetaminophen  (TYLENOL ) 500 MG tablet Take 1,000 mg by mouth every 6 (six) hours as needed for mild pain or moderate pain. For pain   allopurinol  (ZYLOPRIM ) 100 MG tablet Take 100 mg by mouth daily.   aspirin  EC 81 MG tablet Take 1 tablet (81 mg total) by mouth daily.   Blood Glucose Monitoring Suppl (ONETOUCH VERIO REFLECT) w/Device KIT USE TO CHECK BLOOD GLUCOSE 3 TIMES DAILY BEFORE MEALS   Cyanocobalamin (B-12 PO) Place 1,000 mcg under the tongue daily.   diphenoxylate -atropine  (LOMOTIL ) 2.5-0.025 MG tablet Take 1 tablet by mouth 4 (four) times daily as needed for diarrhea or loose stools.   esomeprazole  (NEXIUM ) 40 MG capsule Take 40 mg by mouth every evening.   fexofenadine (ALLEGRA) 180 MG tablet Take 180 mg by mouth daily as needed for allergies.   furosemide  (LASIX ) 20 MG tablet Take 20 mg by mouth daily.   linagliptin  (TRADJENTA ) 5 MG TABS tablet Take 5 mg by mouth daily after breakfast.   meclizine  (ANTIVERT ) 25 MG tablet Take 25 mg by mouth 2 (two) times daily.   MITIGARE  0.6 MG CAPS Take 1 capsule by mouth daily.   Multiple Vitamins-Minerals (WOMENS MULTIVITAMIN PO) Take 1 tablet by mouth daily.   potassium chloride  SA (KLOR-CON  M20) 20 MEQ tablet Take 1 tablet (20 mEq total) by mouth daily.   ramipril  (ALTACE ) 5 MG capsule Take 5 mg by mouth every evening.   rosuvastatin (CRESTOR) 20 MG tablet Take 1 tablet (20 mg total) by mouth daily.   traZODone (DESYREL)  50 MG tablet Take 50 mg by mouth at bedtime.   [DISCONTINUED] carvedilol  (COREG ) 3.125 MG tablet TAKE 1 TABLET BY MOUTH TWICE A DAY   [DISCONTINUED] isosorbide  mononitrate (IMDUR ) 60 MG 24 hr tablet Take 1 tablet (60 mg total) by mouth 2 (two) times daily.   [DISCONTINUED] nitroGLYCERIN   (NITROSTAT ) 0.4 MG SL tablet Place 1 tablet (0.4 mg total) under the tongue every 5 (five) minutes as needed for chest pain.   [DISCONTINUED] simvastatin  (ZOCOR ) 40 MG tablet Take 40 mg by mouth at bedtime.    Physical Exam:    VS:  BP 130/72   Pulse 82   Ht 5\' 6"  (1.676 m)   Wt 212 lb (96.2 kg)   SpO2 96%   BMI 34.22 kg/m    Wt Readings from Last 3 Encounters:  11/30/23 212 lb (96.2 kg)  11/15/23 213 lb (96.6 kg)  09/28/23 209 lb 7 oz (95 kg)    GEN: Well nourished, well developed in no acute distress NECK: No JVD; No carotid bruits CARDIAC: RRR, 2/6 systolic murmur, no rubs or gallops RESPIRATORY:  Clear to auscultation without rales, wheezing or rhonchi  ABDOMEN: Soft, non-tender, non-distended EXTREMITIES:  Trace ankle edema; No acute deformity     Asessement and Plan:.    CAD: Mild to moderate by LHC in 2017.  MPI in 06/2019 was a low risk study with no change compared to stress in 2017.  Patient was admitted in 09/2023 with chest discomfort relieved with sublingual nitroglycerin , workup reassuring including negative troponins x 2 and echocardiogram that indicated LVEF of 60 to 65%, no RWMA, mild asymmetric LVH and grade 1 DD.  Today patient reports that she has been doing well, she denies any significant chest pain or pressure similar to what sent her to the emergency department.  She notes that with discontinuation of hydrochlorothiazide  she had some increased fluid accumulation with this had a slight chest pressure that resolved with rest, per PCP started on Lasix  with significant improvement.  She denies any further discomforts.  Discussed stress testing, deferred at this time through shared decision making.  Encouraged patient to continue monitoring her symptoms, reviewed ED precautions. Heart healthy diet and regular cardiovascular exercise encouraged.  Continue aspirin  81 mg daily, carvedilol  3.125 mg twice daily, Imdur  60 mg twice daily, ramipril  5 mg daily.  As noted below  will stop simvastatin  and start on rosuvastatin 20 mg daily. Patient reports that she recently had lab work completed at her PCPs office, we will request recent lab work. Refill of Carvedilol , Imdur  and nitroglycerin  provided.   Hypertension: Initial blood pressure today 136/82, on recheck was 130/72.  Patient reports at home that her systolic blood pressure is typically between 130-140 systolic, she notes if it drops lower than 130 she can become dizzy and lightheaded with position changes.  Reviewed orthostatic precautions.  Continue carvedilol  3.125 mg twice daily, Imdur  60 mg twice daily, ramipril  5 mg nightly.  Hyperlipidemia: Last lipid profile on 02/21/2023 indicated total cholesterol 158, HDL 62, triglycerides 72 and LDL 81.  Patient with lipid profile earlier this month indicated LDL of 85.  Reviewed with patient recommendation for lower LDL goal given history of type 2 diabetes and mild to moderate CAD by [facility] in 2017.  Patient will stop simvastatin  and start on Crestor 20 mg daily.  Check fasting lipid profile and LFTs in 2 months.  Type 2 DM: Last hemoglobin A1c 7.6 on 09/28/2023.  Monitor managed per PCP.  Aortic valve stenosis:  Noted to be mild to moderate on echo on 09/29/23. Plan to repeat echocardiogram in one to two years. On lasix  20 mg daily.     Disposition: F/u with Dr. Maximo Spar in four months or sooner if needed.   Signed, Philipe Laswell D Josie Burleigh, NP

## 2023-11-28 ENCOUNTER — Ambulatory Visit: Payer: Medicare Other | Admitting: Podiatry

## 2023-11-30 ENCOUNTER — Ambulatory Visit: Attending: Cardiology | Admitting: Cardiology

## 2023-11-30 ENCOUNTER — Encounter: Payer: Self-pay | Admitting: Cardiology

## 2023-11-30 VITALS — BP 130/72 | HR 82 | Ht 66.0 in | Wt 212.0 lb

## 2023-11-30 DIAGNOSIS — E785 Hyperlipidemia, unspecified: Secondary | ICD-10-CM | POA: Diagnosis not present

## 2023-11-30 DIAGNOSIS — I10 Essential (primary) hypertension: Secondary | ICD-10-CM | POA: Diagnosis not present

## 2023-11-30 DIAGNOSIS — E1169 Type 2 diabetes mellitus with other specified complication: Secondary | ICD-10-CM

## 2023-11-30 DIAGNOSIS — I350 Nonrheumatic aortic (valve) stenosis: Secondary | ICD-10-CM

## 2023-11-30 DIAGNOSIS — I2510 Atherosclerotic heart disease of native coronary artery without angina pectoris: Secondary | ICD-10-CM | POA: Diagnosis not present

## 2023-11-30 DIAGNOSIS — E669 Obesity, unspecified: Secondary | ICD-10-CM

## 2023-11-30 MED ORDER — CARVEDILOL 3.125 MG PO TABS: 3.1250 mg | ORAL_TABLET | Freq: Two times a day (BID) | ORAL | 3 refills | Status: AC

## 2023-11-30 MED ORDER — NITROGLYCERIN 0.4 MG SL SUBL: 0.4000 mg | SUBLINGUAL_TABLET | SUBLINGUAL | 3 refills | Status: AC | PRN

## 2023-11-30 MED ORDER — ROSUVASTATIN CALCIUM 20 MG PO TABS: 20.0000 mg | ORAL_TABLET | Freq: Every day | ORAL | 3 refills | Status: AC

## 2023-11-30 MED ORDER — ISOSORBIDE MONONITRATE ER 60 MG PO TB24: 60.0000 mg | ORAL_TABLET | Freq: Two times a day (BID) | ORAL | 3 refills | Status: AC

## 2023-11-30 NOTE — Patient Instructions (Signed)
 Medication Instructions:  Stop Simvastatin  Start Crestor 20 mg once a day  *If you need a refill on your cardiac medications before your next appointment, please call your pharmacy*  Lab Work: In 2-3 months we are going to need fasting lipid panel and LFT's If you have labs (blood work) drawn today and your tests are completely normal, you will receive your results only by: MyChart Message (if you have MyChart) OR A paper copy in the mail If you have any lab test that is abnormal or we need to change your treatment, we will call you to review the results.  Testing/Procedures: No testing  Follow-Up: At [facility], you and your health needs are our priority.  As part of our continuing mission to provide you with exceptional heart care, our providers are all part of one team.  This team includes your primary Cardiologist (physician) and Advanced Practice Providers or APPs (Physician Assistants and Nurse Practitioners) who all work together to provide you with the care you need, when you need it.  Your next appointment:   4 month(s)  Provider:   Hazle Lites, MD    We recommend signing up for the patient portal called "MyChart".  Sign up information is provided on this After Visit Summary.  MyChart is used to connect with patients for Virtual Visits (Telemedicine).  Patients are able to view lab/test results, encounter notes, upcoming appointments, etc.  Non-urgent messages can be sent to your provider as well.   To learn more about what you can do with MyChart, go to [email or web address].

## 2023-12-22 ENCOUNTER — Ambulatory Visit: Admitting: Podiatry

## 2024-01-09 ENCOUNTER — Encounter: Payer: Self-pay | Admitting: Podiatry

## 2024-01-09 ENCOUNTER — Ambulatory Visit: Admitting: Podiatry

## 2024-01-09 DIAGNOSIS — M79675 Pain in left toe(s): Secondary | ICD-10-CM

## 2024-01-09 DIAGNOSIS — M79674 Pain in right toe(s): Secondary | ICD-10-CM

## 2024-01-09 DIAGNOSIS — B351 Tinea unguium: Secondary | ICD-10-CM

## 2024-01-09 NOTE — Progress Notes (Signed)
  Subjective:  Patient ID: Peggy French, female    DOB: 10/10/1940,  MRN: [id number]  Chief Complaint  Patient presents with   Diabetes    Get my toenails cut.  Saw Dr. Elna Potters - will see on 01/16/24; A1c - 7.6    83 y.o. female presents with the above complaint. History confirmed with patient.  Her diabetes is well-controlled nails are thickened elongated and causing discomfort.  Previous debridement was helpful.  Blood sugar remains well-controlled.  Having foot pain her neuropathy is feeling worse.  She is going to discuss with her doctor next month about having being taken off her gabapentin   Objective:  Physical Exam: warm, good capillary refill, no trophic changes or ulcerative lesions, normal DP and PT pulses, and abnormal sensory exam. Left Foot: dystrophic yellowed discolored nail plates with subungual debris Right Foot: dystrophic yellowed discolored nail plates with subungual debris.     Assessment:   1. Pain due to onychomycosis of toenails of both feet        Plan:  Patient was evaluated and treated and all questions answered.    Discussed the etiology and treatment options for the condition in detail with the patient.  Prior debridement has been helpful in reducing pain and improving function. Recommended debridement of the nails today. Sharp and mechanical debridement performed of all painful and mycotic nails today. Nails debrided in length and thickness using a nail nipper to level of comfort. Discussed treatment options including appropriate shoe gear. Follow up as needed for painful nails.    Return in about 3 months (around 04/10/2024) for at risk diabetic foot care.

## 2024-01-16 ENCOUNTER — Other Ambulatory Visit: Payer: Self-pay | Admitting: Internal Medicine

## 2024-02-08 ENCOUNTER — Ambulatory Visit (HOSPITAL_BASED_OUTPATIENT_CLINIC_OR_DEPARTMENT_OTHER)
Admission: RE | Admit: 2024-02-08 | Discharge: 2024-02-08 | Disposition: A | Discharge status: Home / Self Care | Source: Ambulatory Visit | Attending: Nurse Practitioner | Admitting: Nurse Practitioner

## 2024-02-08 ENCOUNTER — Encounter: Payer: Self-pay | Admitting: Nurse Practitioner

## 2024-02-08 ENCOUNTER — Other Ambulatory Visit: Payer: Self-pay

## 2024-02-08 ENCOUNTER — Inpatient Hospital Stay: Payer: Medicare Other | Admitting: Nurse Practitioner

## 2024-02-08 ENCOUNTER — Inpatient Hospital Stay: Payer: Medicare Other | Attending: Nurse Practitioner

## 2024-02-08 VITALS — BP 141/79 | HR 96 | Temp 97.9°F | Resp 18 | Ht 66.0 in | Wt 212.0 lb

## 2024-02-08 DIAGNOSIS — C7800 Secondary malignant neoplasm of unspecified lung: Secondary | ICD-10-CM | POA: Diagnosis not present

## 2024-02-08 DIAGNOSIS — Z85038 Personal history of other malignant neoplasm of large intestine: Secondary | ICD-10-CM | POA: Diagnosis present

## 2024-02-08 DIAGNOSIS — G620 Drug-induced polyneuropathy: Secondary | ICD-10-CM | POA: Insufficient documentation

## 2024-02-08 DIAGNOSIS — C182 Malignant neoplasm of ascending colon: Secondary | ICD-10-CM | POA: Insufficient documentation

## 2024-02-08 DIAGNOSIS — Z08 Encounter for follow-up examination after completed treatment for malignant neoplasm: Secondary | ICD-10-CM | POA: Insufficient documentation

## 2024-02-08 DIAGNOSIS — C189 Malignant neoplasm of colon, unspecified: Secondary | ICD-10-CM

## 2024-02-08 LAB — CEA (ACCESS): CEA (CHCC): 5.29 ng/mL — ABNORMAL HIGH (ref 0.00–5.00)

## 2024-02-08 NOTE — Progress Notes (Signed)
 [facility] OFFICE PROGRESS NOTE   Diagnosis: Colon cancer  INTERVAL HISTORY:   Peggy French returns as scheduled.  No change in bowel habits.  No bloody or black stools.  No nausea or vomiting.  No abdominal pain.  She has a good appetite.  During the night she woke up with severe crampy pain at the right upper inner leg.  She was uncomfortable throughout the night.  She noted some relief when she showered this morning.  She is not aware of any type of leg injury.  No new or unusual activities.  She denies fever.  Objective:  Vital signs in last 24 hours:  Blood pressure (!) 141/79, pulse 96, temperature 97.9 F (36.6 C), temperature source Temporal, resp. rate 18, height 5' 6 (1.676 m), weight 212 lb (96.2 kg), SpO2 97%.    Lymphatics: No palpable cervical, supraclavicular, axillary or inguinal lymph nodes. Resp: Lungs clear bilaterally. Cardio: Regular rate and rhythm. GI: No hepatosplenomegaly. Vascular: No leg edema. Neuro: Alert and oriented. Musculoskeletal: Marked tenderness right upper inner thigh region, slight fullness, no discrete mass.  No rash in this area.   Lab Results:  Lab Results  Component Value Date   WBC 5.1 09/29/2023   HGB 13.1 09/29/2023   HCT 40.3 09/29/2023   MCV 84.1 09/29/2023   PLT 150 09/29/2023   NEUTROABS 2.3 09/28/2023    Imaging:  No results found.  Medications: I have reviewed the patient's current medications.  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 She began adjuvant CAPOX chemotherapy on 02/24/2012. She completed cycle 8 on 07/27/2012. Negative surveillance colonoscopy 12/08/2012 Delayed nausea following cycle 1 CAPOX. Aloxi  was added beginning with cycle 2 with improvement. Diabetes. Microcytic anemia. Hemoglobin is normal. She will discontinue iron. Remote history of a colon tumor removed endoscopically in 1980. Left lower lung nodule noted  on a CT 02/21/2012, slightly larger than on a CT 12/03/2011. Chest CT 06/12/2012 showed the previously seen 5 mm pulmonary nodule in left lower lobe was scarcely visualized, estimated at 3 mm. No other suspicious pulmonary nodules were identified. Chest CT 11/27/2012 showed interval enlargement of the left lower lobe pulmonary nodule with a current measurement of 7 mm x 8 mm. No new pulmonary nodules. CT 02/22/2013 with enlargement of the anterior left lower lobe nodule and no other lung nodules. PET scan 03/02/2013 showed no associated hypermetabolism with the 12 mm left lower lobe pulmonary nodule. No hypermetabolic lymph nodes in the neck, chest, abdomen or pelvis. No abnormal hypermetabolic activity within the liver, pancreas, adrenal glands or spleen. Abnormal FDG accumulation in the posterior left nasopharyngeal mucosa. Status post a wedge resection of the left lower lobe nodule on 03/27/2013 with the pathology confirming metastatic adenocarcinoma of colorectal primary.   Chest CT 07/18/2013-negative for recurrent disease.   Chest CT 09/19/2013 to evaluate for shortness of breath showed postoperative changes in the left lower lobe without evidence of recurrent or metastatic disease. CT chest 04/01/2014 with postsurgical scarring in the left lower lobe. No evidence for metastatic disease in the chest. Stable appearance of mild subpleural reticulation of the lung bases. CT chest/abdomen/pelvis 10/01/2014 with postoperative changes left lower lobe. No evidence of metastatic disease in the chest, abdomen or pelvis.  CT chest/abdomen/pelvis 11/03/2015 with no evidence of recurrent malignancy. Colonoscopy 03/25/2016-tubular adenomas removed from the transverse colon and rectum CT chest/abdomen/pelvis 06/17/2017-no evidence of recurrent malignancy. CT chest/abdomen/pelvis 06/14/2018-no evidence of recurrent disease CTs chest/abdomen/pelvis 06/27/2019-no evidence of  recurrent disease Elevated [name]  12/25/2020 CTs 01/19/2021-no evidence of metastatic disease Colonoscopy 07/29/2021-8 mm polyp in the proximal transverse colon (tubular adenoma), diverticulosis in the sigmoid colon Status post Port-A-Cath placement 02/22/2012. Status post Port-A-Cath repositioning 03/14/2012. Port-A-Cath removed 06/04/2014 Probable acute laryngopharyngeal dysesthesia following cycle 2 CAPOX. Symptoms resolved with warming.   Oxaliplatin  neuropathy with prolonged cold sensitivity. She has persistent neuropathy symptoms involving the feet. Improved with Neurontin  History of anterior chest pain-she has been evaluated by cardiology Hand-foot syndrome secondary to Xeloda -improved with a dose reduction of Xeloda .      Disposition: Peggy French remains in clinical remission from colon cancer.  We will follow-up on the [name] from today.  Etiology of the right upper inner thigh pain/tenderness is unclear.  We are referring her for a venous Doppler.  She will return for a [name] and follow-up visit in 6 months.  We are available to see her sooner if needed.   Patient seen with Dr. Cloretta.   Olam Ned ANP/GNP-BC   02/08/2024  1:41 PM  This was a shared visit with Olam Ned.  Peggy French was interviewed and examined.  She is in remission from colon cancer.  She developed acute pain and tenderness at the upper inner right thigh yesterday.  I suspect the thigh pain is related to a benign musculoskeletal condition.  She will be referred for a Doppler to rule out a DVT.  I was present for greater than 50% of today's visit.  I performed medical decision making.  Arvella Cloretta, MD

## 2024-02-09 ENCOUNTER — Telehealth: Payer: Self-pay

## 2024-02-09 NOTE — Telephone Encounter (Signed)
-----   Message from Olam Ned sent at 02/08/2024  3:47 PM EDT ----- Please let Ms. Haley know the Doppler study was negative for DVT.  Ask her to please call us  early next week with an update.  Thanks

## 2024-02-09 NOTE — Telephone Encounter (Signed)
 Patient voiced understanding, will call next week with an update. No further questions indicated.

## 2024-02-09 NOTE — Telephone Encounter (Signed)
-----   Message from Olam Ned sent at 02/08/2024  4:02 PM EDT ----- Please let her know [name] is stable, mildly elevated.  Follow-up as scheduled.

## 2024-02-20 ENCOUNTER — Other Ambulatory Visit: Payer: Self-pay | Admitting: Internal Medicine

## 2024-02-25 ENCOUNTER — Other Ambulatory Visit: Payer: Self-pay | Admitting: Internal Medicine

## 2024-03-26 ENCOUNTER — Ambulatory Visit: Attending: Internal Medicine | Admitting: Internal Medicine

## 2024-03-26 ENCOUNTER — Encounter: Payer: Self-pay | Admitting: Internal Medicine

## 2024-03-26 VITALS — BP 112/74 | HR 89 | Ht 64.0 in | Wt 216.1 lb

## 2024-03-26 DIAGNOSIS — I10 Essential (primary) hypertension: Secondary | ICD-10-CM

## 2024-03-26 DIAGNOSIS — I350 Nonrheumatic aortic (valve) stenosis: Secondary | ICD-10-CM

## 2024-03-26 DIAGNOSIS — E785 Hyperlipidemia, unspecified: Secondary | ICD-10-CM

## 2024-03-26 DIAGNOSIS — I2510 Atherosclerotic heart disease of native coronary artery without angina pectoris: Secondary | ICD-10-CM

## 2024-03-26 DIAGNOSIS — E119 Type 2 diabetes mellitus without complications: Secondary | ICD-10-CM | POA: Diagnosis not present

## 2024-03-26 NOTE — Addendum Note (Signed)
 Addended by: LORING ANDRIETTE HERO on: 03/26/2024 12:23 PM   Modules accepted: Orders

## 2024-03-26 NOTE — Patient Instructions (Signed)
 Medication Instructions:  NO CHANGES  *If you need a refill on your cardiac medications before your next appointment, please call your pharmacy*   Follow-Up: At [facility], you and your health needs are our priority.  As part of our continuing mission to provide you with exceptional heart care, our providers are all part of one team.  This team includes your primary Cardiologist (physician) and Advanced Practice Providers or APPs (Physician Assistants and Nurse Practitioners) who all work together to provide you with the care you need, when you need it.  Your next appointment:     12 months with Dr. Mona or Katlyn West NP  We recommend signing up for the patient portal called MyChart.  Sign up information is provided on this After Visit Summary.  MyChart is used to connect with patients for Virtual Visits (Telemedicine).  Patients are able to view lab/test results, encounter notes, upcoming appointments, etc.  Non-urgent messages can be sent to your provider as well.   To learn more about what you can do with MyChart, go to [email or web address].

## 2024-03-26 NOTE — Progress Notes (Signed)
 03/26/2024 Peggy French   03-Dec-1940  [id number]  Primary Physician Leigh Lung, MD Primary Cardiologist: Dr Mona  CC: No complaints   HPI:  Pleasant 83 y/o obese AA female with a history of minor CAD by cath in 2001 and 2012. She has had chest pain and was seen in the ED in Sept 2016 and Myoview , chest CTA, and echo in Dec 2016 were unremarkable then. She was seen again in the ED Sept 2017 and ruled out for an MI. The pt says she had chest pain over the weekend. She did not go to the hospital.  She saw Dr Leigh yesterday and he reassured her her EKG looked OK but wanted her seen here. The pt describes mid sternal heaviness and tightness that started Friday PM and lasted all weekend. Friday she had associated nausea, vomiting, and sweating. Her pain would come and go, not obviously worsened with activity. She did not take NTG. She is on daily Nexium  and is s/p remote cholecystectomy. Other medical problems include HTN, HLD, NIDDM, obesity, GERD, colon cancer s/p colectomy, and lung cancer s/p wedge resection.   06/24/2016  [name] returns today for follow-up of her heart catheterization. I have not seen her in a number of years. She recently saw Peggy Canterbury, PA-C, who recommended a stress test for some symptoms concerning for angina. That stress test was negative for ischemia however, there was an abnormal TID ratio. I was planning on arranging for her catheterization however she had some more chest pain symptoms and presented to [facility]. Her pain was relieved with nitroglycerin  and she was referred for cardiac catheterization. This was performed on 06/18/2016 by Dr. Mady, which demonstrated up to 30% mild to moderate nonobstructive coronary disease. LVEDP was 5 mmHg, LV EF was 60-65%. It was recommended that she go on to a long-acting nitrate for possible small vessel coronary disease. She says that she's been taking this and still is required a couple extra sublingual  nitroglycerin . She's been under significant stress. Her son was killed a number of years ago and had another son who died recently. Think this is playing a big role in things.  09/28/2016  [name] returns today for follow-up. Overall she is doing well without any recurrent chest pain. Blood pressure today is well controlled at 126/78. She denies any chest pain or worsening shortness of breath. EKG shows sinus rhythm at 91. It seems like the carvedilol  seems to be working better at rate control for her. She was also started on HCTZ which he is helpful for her blood pressure is well.  05/12/2017  [name] was seen today in follow-up.  She continues to be asymptomatic.  She denies chest pain or worsening shortness of breath.  Blood pressure is well-controlled today 124/82.  She is followed by Dr. Channing at the cancer center for anemia.  EKG today shows sinus rhythm at 84.  She occasionally gets some chest discomfort which is relieved by Tums. She is also had some shortness of breath with exertion however is transient and is not worsening.  11/02/2017  [name] returns today for follow-up.  Overall she seems to be doing well.  She has some occasional chest discomfort which is short-lived.  She is being followed up for cancer but has a low [name].  EKG shows sinus rhythm without any ischemic changes.  She denies any worsening shortness of breath.  She has been under a lot of stress recently.  Weight has been stable.  Blood pressure is at goal.  She has not had recent lipid testing.  She also saw her primary recently for what sounds like a viral gastroenteritis.  05/31/2019  Peggy French is seen today in follow-up.  Overall she is doing well but recently has had some chest pain.  She is Hao Meng PA-C in the office about a month ago.  He had recommended based on her prior history of some mild coronary disease by cath in 2017 for her to undergo CT coronary angiogram.  Unfortunately this was never scheduled.  There might have been an  insurance issue.  When she had cath last time she did have a Myoview  stress test which was normal except for 3 times daily.  Ultimately that cath showed some mild nonobstructive coronary disease.  She is now having some more chest pain symptoms which have some typical but more atypical qualities.  She is on isosorbide  and other good medical therapies.  She also has cancer and of course is under treatment with Dr. Cloretta.  She has a CT coming up to reassess that.  01/14/2021  Peggy French returns today for follow-up.  Blood pressure was initially elevated however did come down.  She denies any chest pain or worsening shortness of breath.  EKG today shows normal sinus rhythm.  She was hospitalized in March with chest pain concerning for unstable angina.  Echo at that time showed normal systolic function and mild diastolic dysfunction without new wall motion abnormalities.  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  She has been undergoing treatment with that and is scheduled for follow-up CT imaging in a few days.  03/26/2024  Peggy French returns today for follow-up.  Overall she seems to be doing pretty well.  She more recently saw Reche Mose, NP and had been doing well.  Her statin was changed from simvastatin  to rosuvastatin  for more efficacy less potential for side effect.  Recent lipids in July showed total cholesterol 152, HDL 60, LDL 76 and triglycerides 76.  She denies any anginal symptoms.  She is on twice daily Imdur  and rarely has to take additional nitroglycerin .  Blood pressure was 112/74.  She does have mild to moderate aortic stenosis as seen again by echo in March 2025.  LVEF was normal.  She continues to follow up with oncology for her history of cancer.   Current Outpatient Medications  Medication Sig Dispense Refill   acetaminophen  (TYLENOL ) 500 MG tablet Take 1,000 mg by mouth every 6 (six) hours as needed for mild pain or moderate pain. For pain     allopurinol   (ZYLOPRIM ) 100 MG tablet Take 100 mg by mouth daily.     aspirin  EC 81 MG tablet Take 1 tablet (81 mg total) by mouth daily. 30 tablet 11   Blood Glucose Monitoring Suppl (ONETOUCH VERIO REFLECT) w/Device KIT USE TO CHECK BLOOD GLUCOSE 3 TIMES DAILY BEFORE MEALS     carvedilol  (COREG ) 3.125 MG tablet Take 1 tablet (3.125 mg total) by mouth 2 (two) times daily. 180 tablet 3   Cyanocobalamin (B-12 PO) Place 1,000 mcg under the tongue daily.     diphenoxylate -atropine  (LOMOTIL ) 2.5-0.025 MG tablet Take 1 tablet by mouth 4 (four) times daily as needed for diarrhea or loose stools.     esomeprazole  (NEXIUM ) 40 MG capsule Take 40 mg by mouth every evening.     fexofenadine (ALLEGRA) 180 MG tablet Take 180 mg by mouth daily as needed for allergies.  furosemide  (LASIX ) 20 MG tablet Take 20 mg by mouth daily.     isosorbide  mononitrate (IMDUR ) 60 MG 24 hr tablet Take 1 tablet (60 mg total) by mouth 2 (two) times daily. 180 tablet 3   linagliptin  (TRADJENTA ) 5 MG TABS tablet Take 5 mg by mouth daily after breakfast.     meclizine  (ANTIVERT ) 25 MG tablet Take 25 mg by mouth 2 (two) times daily.     MITIGARE  0.6 MG CAPS Take 1 capsule by mouth daily.     Multiple Vitamins-Minerals (WOMENS MULTIVITAMIN PO) Take 1 tablet by mouth daily.     nitroGLYCERIN  (NITROSTAT ) 0.4 MG SL tablet Place 1 tablet (0.4 mg total) under the tongue every 5 (five) minutes as needed for chest pain. 25 tablet 3   potassium chloride  SA (KLOR-CON  M20) 20 MEQ tablet Take 1 tablet (20 mEq total) by mouth daily. 30 tablet 2   ramipril  (ALTACE ) 5 MG capsule Take 5 mg by mouth every evening.     rosuvastatin  (CRESTOR ) 20 MG tablet Take 1 tablet (20 mg total) by mouth daily. 90 tablet 3   traZODone (DESYREL) 50 MG tablet Take 50 mg by mouth at bedtime.     gabapentin  (NEURONTIN ) 100 MG capsule Take 100 mg by mouth 3 (three) times daily.     No current facility-administered medications for this visit.   Facility-Administered  Medications Ordered in Other Visits  Medication Dose Route Frequency Provider Last Rate Last Admin   sodium chloride  0.9 % injection 10 mL  10 mL Intracatheter PRN Cloretta Arley NOVAK, MD        Allergies  Allergen Reactions   Aspirin  Palpitations    Can tolerate 81 mg    Social History   Socioeconomic History   Marital status: Married    Spouse name: Not on file   Number of children: 4   Years of education: Not on file   Highest education level: Not on file  Occupational History    Employer: RETIRED  Tobacco Use   Smoking status: Never   Smokeless tobacco: Never  Vaping Use   Vaping status: Never Used  Substance and Sexual Activity   Alcohol  use: No   Drug use: No   Sexual activity: Not Currently  Other Topics Concern   Not on file  Social History Narrative   Married to husband, Lamar   Retired Scientist, product/process development   Social Drivers of Corporate investment banker Strain: Not on file  Food Insecurity: No Food Insecurity (09/28/2023)   Hunger Vital Sign    Worried About Running Out of Food in the Last Year: Never true    Ran Out of Food in the Last Year: Never true  Transportation Needs: No Transportation Needs (09/28/2023)   PRAPARE - Administrator, Civil Service (Medical): No    Lack of Transportation (Non-Medical): No  Physical Activity: Not on file  Stress: Not on file  Social Connections: Moderately Integrated (09/28/2023)   Social Connection and Isolation Panel    Frequency of Communication with Friends and Family: More than three times a week    Frequency of Social Gatherings with Friends and Family: More than three times a week    Attends Religious Services: More than 4 times per year    Active Member of [employer] or Organizations: Yes    Attends Banker Meetings: More than 4 times per year    Marital Status: Widowed  Intimate Partner Violence: Not At Risk (09/28/2023)  Humiliation, Afraid, Rape, and Kick questionnaire    Fear of Current or  Ex-Partner: No    Emotionally Abused: No    Physically Abused: No    Sexually Abused: No     Review of Systems: Pertinent items noted in HPI and remainder of comprehensive ROS otherwise negative.  Exam: Blood pressure 112/74, pulse 89, height 5' 4 (1.626 m), weight 216 lb 1.6 oz (98 kg), SpO2 96%.  General appearance: alert, cooperative, no distress and morbidly obese Neck: no JVD Lungs: clear to auscultation bilaterally Heart: regular rate and rhythm Abdomen: obese, no obvious RLQ tenderness Extremities: extremities normal, atraumatic, no cyanosis or edema Pulses: 2+ and symmetric Skin: Skin color, texture, turgor normal. No rashes or lesions Neurologic: Grossly normal  EKG: EKG Interpretation Date/Time:  Monday March 26 2024 11:43:55 EDT Ventricular Rate:  89 PR Interval:  162 QRS Duration:  66 QT Interval:  322 QTC Calculation: 391 R Axis:   -3  Text Interpretation: Normal sinus rhythm Minimal voltage criteria for LVH, may be normal variant ( R in aVL ) Cannot rule out Anterior infarct , age undetermined When compared with ECG of 28-Sep-2023 16:17, Minimal criteria for Anterior infarct are now Present Confirmed by Mona Kent [phone]) on 03/26/2024 11:50:40 AM    ASSESSMENT: METS colon CA to the lung Dyspnea - normal LVEF 55-60%, grade 1 DD (09/2020) Mild to moderate aortic stenosis (09/2023) Mild, nonobstructive coronary disease (cath 2017) - abnormal TID on myoview , no ischemia Essential hypertension Dyslipidemia.  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 Horine seems to be doing well without any worsening chest pain.  She has more or less stable angina although has had no ischemic findings on recent testing.  Echo shows stable normal LVEF with mild to moderate aortic stenosis.  Will continue with routine annual echoes.  Blood pressure is well-controlled.  Her LDL is just above target less than 70 on rosuvastatin .  No  changes to her medications today.  Plan follow-up with us  annually or sooner as necessary.  Will order repeat echo for next year.  Kent KYM Mona, MD, [facility], FNLA, FACP  [place]  [facility]  Medical Director of the Advanced Lipid Disorders &  Cardiovascular Risk Reduction Clinic Diplomate of the American Board of Clinical Lipidology Attending Cardiologist  Direct Dial: [phone]  Fax: [phone]  Website:  [email or web address] Kent BROCKS Kendell Sagraves  03/26/2024 11:50 AM

## 2024-03-27 ENCOUNTER — Other Ambulatory Visit: Payer: Self-pay | Admitting: Internal Medicine

## 2024-04-16 ENCOUNTER — Ambulatory Visit: Admitting: Podiatry

## 2024-04-16 ENCOUNTER — Encounter: Payer: Self-pay | Admitting: Podiatry

## 2024-04-16 VITALS — Ht 64.0 in | Wt 216.1 lb

## 2024-04-16 DIAGNOSIS — M7662 Achilles tendinitis, left leg: Secondary | ICD-10-CM

## 2024-04-16 DIAGNOSIS — B351 Tinea unguium: Secondary | ICD-10-CM | POA: Diagnosis not present

## 2024-04-16 DIAGNOSIS — M79674 Pain in right toe(s): Secondary | ICD-10-CM

## 2024-04-16 DIAGNOSIS — M79675 Pain in left toe(s): Secondary | ICD-10-CM | POA: Diagnosis not present

## 2024-04-16 DIAGNOSIS — M7661 Achilles tendinitis, right leg: Secondary | ICD-10-CM | POA: Diagnosis not present

## 2024-04-16 NOTE — Patient Instructions (Addendum)

## 2024-04-16 NOTE — Progress Notes (Signed)
  Subjective:  Patient ID: Peggy French, female    DOB: 1940/11/13,  MRN: [id number]  Chief Complaint  Patient presents with   Diabetes    Rm 21 DFC    83 y.o. female presents with the above complaint. History confirmed with patient.  Her diabetes is well-controlled nails are thickened elongated and causing discomfort.  Previous debridement was helpful.  Blood sugar remains well-controlled.  Her Achilles tendon pain has returned and is worse in the back of both heels.    Objective:  Physical Exam: warm, good capillary refill, no trophic changes or ulcerative lesions, normal DP and PT pulses, and abnormal sensory exam.  Pain to palpation of the insertion of the posterior Achilles bilateral Left Foot: dystrophic yellowed discolored nail plates with subungual debris Right Foot: dystrophic yellowed discolored nail plates with subungual debris.     Assessment:   1. Achilles tendinitis of both lower extremities   2. Pain due to onychomycosis of toenails of both feet        Plan:  Patient was evaluated and treated and all questions answered.    Discussed the etiology and treatment options for the condition in detail with the patient.  Prior debridement has been helpful in reducing pain and improving function. Recommended debridement of the nails today. Sharp and mechanical debridement performed of all painful and mycotic nails today. Nails debrided in length and thickness using a nail nipper to level of comfort. Discussed treatment options including appropriate shoe gear. Follow up as needed for painful nails.   Discussed the etiology and treatment options for Achilles tendinitis including stretching, formal physical therapy with an eccentric exercises therapy plan, supportive shoegears such as a running shoe or sneaker, heel lifts, topical and oral medications.  We also discussed that I do not routinely perform injections in this area because of the risk of an increased damage or  rupture of the tendon.  We also discussed the role of surgical treatment of this for patients who do not improve after exhausting non-surgical treatment options.  -XR reviewed with patient -Educated on stretching and icing of the affected limb. -Referral placed to physical therapy. --Discussed if worsens or not improving methylprednisolone taper could offer some anti-inflammatory relief.   Return in about 3 months (around 07/17/2024) for painful thick fungal nails, re-check Achilles tendon.

## 2024-04-23 NOTE — Therapy (Unsigned)
 OUTPATIENT PHYSICAL THERAPY LOWER EXTREMITY EVALUATION   Patient Name: Peggy French MRN: [id number] DOB:09-30-1940, 83 y.o., female Today's Date: 04/26/2024  END OF SESSION:  PT End of Session - 04/26/24 1553     Visit Number 1    Number of Visits 13    Date for Recertification  05/24/24    Authorization Type [employer] Medicare    Authorization Time Period auth requested    Progress Note Due on Visit 10    PT Start Time 1554   pt late arrival   PT Stop Time 1638    PT Time Calculation (min) 44 min    Activity Tolerance Patient tolerated treatment well    Behavior During Therapy [facility] for tasks assessed/performed          Past Medical History:  Diagnosis Date   Asthmatic bronchitis 2011   Colon carcinoma metastatic to 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   a. s/p right hemicolectomy/chemo 2013 with mets to the lung s/p LLL wedge resection 2014.   Diabetes mellitus    x over 10 yrs   GERD (gastroesophageal reflux disease)    Gout    H/O hiatal hernia    History of colon cancer    History of shingles    Hyperlipidemia    Hypertension    EKG 10/12 EPIC, chest- 1 view  6/13 EPIC    Last 2D Echo on 05/02/2012 showed EF of greater than 55%   Microcytic anemia    Mild CAD    a. cath by Dr. Mona in 03/2011 - 20-30% ostial bifurcation stenosis of LAD, otherwise only mild luminal irregularities in LAD/RCA, LVEF 60%, no RWMA.   c. 06/18/16 LHC after abnormal nuc showed mild non obst CAD   Neuropathy    Obesity    Past Surgical History:  Procedure Laterality Date   ABDOMINAL HYSTERECTOMY     APPENDECTOMY     CARDIAC CATHETERIZATION  2001,2012   CARDIAC CATHETERIZATION N/A 06/18/2016   Procedure: Left Heart Cath and Coronary Angiography;  Surgeon: Lonni Hanson, MD;  Location: [facility] INVASIVE CV LAB;  Service: Cardiovascular;  Laterality: N/A;   CATARACT EXTRACTION W/PHACO Left 02/17/2015   Procedure: CATARACT EXTRACTION PHACO AND INTRAOCULAR LENS PLACEMENT (IOC);  Surgeon: Cherene Mania, MD;   Location: [facility];  Service: Ophthalmology;  Laterality: Left;  CDE:8.01    CATARACT EXTRACTION W/PHACO Right 08/03/2021   Procedure: CATARACT EXTRACTION PHACO AND INTRAOCULAR LENS PLACEMENT (IOC);  Surgeon: Harrie Agent, MD;  Location: [facility];  Service: Ophthalmology;  Laterality: Right;  CDE: 13.97   CHOLECYSTECTOMY     COLON SURGERY     PARTIAL COLECTOMY 30 YRS / AGAIN 2013   COLONOSCOPY  11/26/2011   Procedure: COLONOSCOPY;  Surgeon: Claudis RAYMOND Rivet, MD;  Location: [facility];  Service: Endoscopy;  Laterality: N/A;  2:15   COLONOSCOPY N/A 12/08/2012   Procedure: COLONOSCOPY;  Surgeon: Claudis RAYMOND Rivet, MD;  Location: [facility];  Service: Endoscopy;  Laterality: N/A;  815-moved to 0950 Ann to notify pt   COLONOSCOPY N/A 03/25/2016   Procedure: COLONOSCOPY;  Surgeon: Claudis RAYMOND Rivet, MD;  Location: [facility];  Service: Endoscopy;  Laterality: N/A;  1200   COLONOSCOPY WITH PROPOFOL  N/A 07/29/2021   Procedure: COLONOSCOPY WITH PROPOFOL ;  Surgeon: Rivet Claudis RAYMOND, MD;  Location: [facility];  Service: Endoscopy;  Laterality: N/A;  11:10   ESOPHAGOGASTRODUODENOSCOPY  06/16/2011   Procedure: ESOPHAGOGASTRODUODENOSCOPY (EGD);  Surgeon: Claudis RAYMOND Rivet, MD;  Location: [facility];  Service:  Endoscopy;  Laterality: N/A;  2:15   POLYPECTOMY  07/29/2021   Procedure: POLYPECTOMY;  Surgeon: Golda Claudis PENNER, MD;  Location: [facility];  Service: Endoscopy;;  transverse colon   PORT-A-CATH REMOVAL N/A 06/04/2014   Procedure: REMOVAL PORT-A-CATH;  Surgeon: Donnice KATHEE Lunger, MD;  Location: [facility];  Service: General;  Laterality: N/A;   PORTACATH PLACEMENT  02/22/2012   Procedure: INSERTION PORT-A-CATH;  Surgeon: Donnice KATHEE Lunger, MD;  Location: [facility];  Service: General;  Laterality: N/A;  left subclavian   VIDEO ASSISTED THORACOSCOPY (VATS)/WEDGE RESECTION Left 03/27/2013   Procedure: VIDEO ASSISTED THORACOSCOPY (VATS)/WEDGE RESECTION;  Surgeon: Dallas KATHEE Jude, MD;  Location: [facility];  Service:  Thoracic;  Laterality: Left;   VIDEO BRONCHOSCOPY N/A 03/27/2013   Procedure: VIDEO BRONCHOSCOPY;  Surgeon: [place] KATHEE Jude, MD;  Location: [facility] OR;  Service: Thoracic;  Laterality: N/A;   Patient Active Problem List   Diagnosis Date Noted   PAD (peripheral artery disease) 11/15/2023   GERD (gastroesophageal reflux disease) 09/29/2023   Pain due to onychomycosis of toenails of both feet 04/28/2022   Chest pain 09/29/2020   Moderate risk chest pain 06/24/2016   Mixed hyperlipidemia 06/24/2016   Unstable angina (HCC) 06/19/2016   Mild CAD    Chest pain with moderate risk of acute coronary syndrome 06/17/2015   Type 2 diabetes mellitus with obesity 06/17/2015   Obesity (BMI 30-39.9)    Colon carcinoma metastatic to 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   S/P right colectomy 01/07/2012   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 Reports they told her she had bone spurs on both sides. Reports her ankle pain flares up every now and then. Reports she feels burning in calves sometimes.  PERTINENT HISTORY: 83 y.o. female presents with the above complaint. History confirmed with patient.  Her diabetes is well-controlled nails are thickened elongated and causing discomfort.  Previous debridement was helpful.  Blood sugar remains well-controlled.  Her Achilles tendon pain has returned and is worse in the back of both heels.  Physical Exam: warm, good capillary refill, no trophic changes or  ulcerative lesions, normal DP and PT pulses, and abnormal sensory exam.  Pain to palpation of the insertion of the posterior Achilles bilateral Left Foot: dystrophic yellowed discolored nail plates with subungual debris Right Foot: dystrophic yellowed discolored nail plates with subungual debris.   Pt report history of vertigo DM Gout Neuropathy Cardiac issues  PAIN:  Are you having pain? Yes: NPRS scale: 6/10,  can go up to 9/10 Pain location: back of heels, gastrocs Pain description: Burning pain  Aggravating factors: Sitting too long Relieving factors: raising feet on bed   PRECAUTIONS: None  RED FLAGS: None Pt reports neuropathy in feet bilaterally due to diabetes, and cramping in hands bilaterally, B/B issues due to previous colon cancer  WEIGHT BEARING RESTRICTIONS: No  FALLS:  Has patient fallen in last 6 months? No, reports 2 near misses just today  LIVING ENVIRONMENT: Lives with:  lives with their son, caregiver for son who has dementia Lives in: House/apartment Stairs: No has ramp, one step into laundry area. Has following equipment at home: Single point cane  OCCUPATION: N/A  PLOF: Independent but gets Meals on Wheels  PATIENT GOALS:  To get back to where I can help myself and help my son, being more active, go to church   NEXT MD VISIT: January 2026  OBJECTIVE:  Note: Objective measures were completed at Evaluation unless otherwise noted.  DIAGNOSTIC FINDINGS: N/A  PATIENT SURVEYS:  LEFS  Extreme difficulty/unable (0), Quite a bit of difficulty (1), Moderate difficulty (2), Little difficulty (3), No difficulty (4) Survey date:    Any of your usual work, housework or school activities   2. Usual hobbies, recreational or sporting activities   3. Getting into/out of the bath   4. Walking between rooms   5. Putting on socks/shoes   6. Squatting    7. Lifting an object, like a bag of groceries from the floor   8. Performing light activities around  your home   9. Performing heavy activities around your home   10. Getting into/out of a car   11. Walking 2 blocks   12. Walking 1 mile   13. Going up/down 10 stairs (1 flight)   14. Standing for 1 hour   15.  sitting for 1 hour   16. Running on even ground   17. Running on uneven ground   18. Making sharp turns while running fast   19. Hopping    20. Rolling over in bed   Score total:  Lower Extremity Functional Score: 26 / 80 = 32.5 %      COGNITION: Overall cognitive status: Within functional limits for tasks assessed     SENSATION: WFL  POSTURE: rounded shoulders and forward head   LOWER EXTREMITY ROM:  Active ROM Right eval Left eval  Hip flexion    Hip extension    Hip abduction    Hip adduction    Hip internal rotation    Hip external rotation    Knee flexion    Knee extension    Ankle dorsiflexion 3 7  Ankle plantarflexion 40 42  Ankle inversion    Ankle eversion     (Blank rows = not tested)  LOWER EXTREMITY MMT:  MMT Right eval Left eval  Hip flexion    Hip extension    Hip abduction    Hip adduction    Hip internal rotation    Hip external rotation    Knee flexion    Knee extension 4 * burning pain 4 * burning pain   Ankle dorsiflexion 5 5  Ankle plantarflexion    Ankle inversion    Ankle eversion     (Blank rows = not tested)  LOWER EXTREMITY SPECIAL TESTS:    FUNCTIONAL TESTS:  30 seconds chair stand test Timed up and go (TUG): Next session 2 minute walk test: next session  30 second chair stand: 10 STS, slight burning in heels GAIT: Distance walked: 100 ft in session  Assistive device utilized: Single point cane Level of assistance: Modified independence Comments: SPC in R hand, dec velocity, WBOS, dec hip flex/ext bilat, dec stance time on LLE, dec IC/DF and push off/PF moments bilat  TREATMENT  DATE:  04/26/24: PT Eval and HEP    PATIENT EDUCATION:  Education details: PT evaluation, objective findings, POC, Importance of HEP, Precautions, Clinic policies, Anatomy and physiology Person educated: Patient Education method: Explanation and Demonstration Education comprehension: verbalized understanding and returned demonstration  HOME EXERCISE PROGRAM: Access Code: [email or web address] URL: [email or web address] Date: 04/26/2024 Prepared by: Rosaria Powell-Butler  Exercises - Heel Raises with Counter Support  - 2 x daily - 7 x weekly - 2 sets - 10 reps - Heel Toe Raises with Counter Support  - 2 x daily - 7 x weekly - 2 sets - 10 reps - Seated Dorsiflexion Stretch  - 2 x daily - 7 x weekly - 2 sets - 10 reps - 5 hold  ASSESSMENT:  CLINICAL IMPRESSION: Patient is a 83 y.o. female who was seen today for physical therapy evaluation and treatment for  M76.61,M76.62 (ICD-10-CM) - Achilles tendinitis of both lower extremities  . On this date, patient demonstrates impaired self perception of function, decreased ankle ROM, decreased strength and decreased endurance, all of which may be contributing to patients altered gait pattern, increased pain, and difficulty with functional transfers. Educated patient on proper shoe wear as she is wearing very flat shoes. Explained how shoes with better arch support may help to limit her discomfort in her heels. Plan to give p print off examples in next sessions. Time limited during evaluation due to late arrival. Patient will benefit from skilled physical therapy in order to address current deficits to improve gait mechanics, balance, pain, and overall function to improved quality of life.   OBJECTIVE IMPAIRMENTS: Abnormal gait, cardiopulmonary status limiting activity, decreased activity tolerance, decreased balance, decreased endurance, decreased mobility, difficulty walking, decreased ROM, decreased strength, dizziness, impaired flexibility,  improper body mechanics, postural dysfunction, and pain.   ACTIVITY LIMITATIONS: lifting, bending, sitting, standing, squatting, stairs, and transfers  PARTICIPATION LIMITATIONS: meal prep, cleaning, laundry, and community activity  PERSONAL FACTORS: N/A are also affecting patient's functional outcome.   REHAB POTENTIAL: Good  CLINICAL DECISION MAKING: Stable/uncomplicated  EVALUATION COMPLEXITY: Low   GOALS: Goals reviewed with patient? No  SHORT TERM GOALS: Target date: 05/17/24 Patient will be independent with performance of HEP to demonstrate adequate self management of symptoms.  Baseline:  Goal status: INITIAL  2.   Patient will report at least a 25% improvement with function and/or pain reduction overall since beginning PT. Baseline:  Goal status: INITIAL   LONG TERM GOALS: Target date: 06/07/24 Patient will improve LEFS score by 9 points to demonstrate improved perceived function while meeting MCID.  Baseline: Goal status: INITIAL 2.  Patient will improve  ankle DF  ROM bilaterally to at least 10 degrees to demonstrate improved LE mobility needed for functional transfers and gait mechanics.  Baseline:  Goal status: INITIAL 3.  Patient will perform TUG in 12 seconds or less to demonstrate decreased risk of falls.  Baseline:  Goal status: INITIAL   4.  Patient will improve 30 second chair stand test by at least 2 STS in order to demonstrate improved LE strength/power needed for community ambulation.  Baseline:  Goal status: INITIAL  5. Patient will report daily pain at 4/10 or less with conclusion of her daily activities to demonstrate Improved activity tolerance and pain reduction.  Baseline:  Goal status: INITIAL   PLAN:  PT FREQUENCY: 2x/week  PT DURATION: 6 weeks  PLANNED INTERVENTIONS: 97164- PT Re-evaluation, 97110-Therapeutic exercises, 97530- Therapeutic activity, [id number]- Neuromuscular re-education, 97535- Self Care, 02859- Manual therapy, [id number]-  Gait  training, 02967- Electrical stimulation (manual), [id number]- Ultrasound, [id number]- Traction (mechanical), [phone] (1-2 muscles), 20561 (3+ muscles)- Dry Needling, Patient/Family education, Balance training, Stair training, Taping, Joint mobilization, Spinal mobilization, Cryotherapy, and Moist heat  PLAN FOR NEXT SESSION: Review HEP and goals, TUG, test, balance assessment (FGA/DGI), Give print off examples of shoes with good arch support, prog ROM and strengthening activities.     5:38 PM, May 05, 2024 Rosaria Settler, PT, DPT [facility]    Managed Medicaid Authorization Request Treatment Start Date: 05/05/2024  Visit Dx Codes:  Z74.09 M79.671 M79.672 M25.671 M25.672  Functional Tool Score:  Lower Extremity Functional Score: 26 / 80 = 32.5 % 30 second chair stand: 10 STS  For all possible CPT codes, reference the Planned Interventions line above.     Check all conditions that are expected to impact treatment: {Conditions expected to impact treatment:Diabetes mellitus    If treatment provided at initial evaluation, no treatment charged due to lack of authorization.

## 2024-04-25 ENCOUNTER — Ambulatory Visit (HOSPITAL_COMMUNITY)
Admission: RE | Admit: 2024-04-25 | Discharge: 2024-04-25 | Disposition: A | Discharge status: Home / Self Care | Source: Ambulatory Visit | Attending: Internal Medicine | Admitting: Internal Medicine

## 2024-04-25 DIAGNOSIS — I350 Nonrheumatic aortic (valve) stenosis: Secondary | ICD-10-CM | POA: Diagnosis present

## 2024-04-25 LAB — ECHOCARDIOGRAM COMPLETE
AR max vel: 1.17 cm2
AV Area VTI: 1.2 cm2
AV Area mean vel: 1.22 cm2
AV Mean grad: 16 mmHg
AV Peak grad: 29.3 mmHg
Ao pk vel: 2.71 m/s
Area-P 1/2: 6.71 cm2
Est EF: >75
S' Lateral: 1.9 cm

## 2024-04-25 MED ORDER — PERFLUTREN LIPID MICROSPHERE
1.0000 mL | INTRAVENOUS | Status: AC | PRN
  Administered 2024-04-25: 3 mL via INTRAVENOUS

## 2024-04-25 NOTE — Progress Notes (Signed)
*  PRELIMINARY RESULTS* Echocardiogram 2D Echocardiogram has been performed with Definity .  Peggy French 04/25/2024, 1:16 PM

## 2024-04-26 ENCOUNTER — Ambulatory Visit (HOSPITAL_COMMUNITY): Attending: Podiatry

## 2024-04-26 ENCOUNTER — Other Ambulatory Visit: Payer: Self-pay

## 2024-04-26 ENCOUNTER — Encounter (HOSPITAL_COMMUNITY): Payer: Self-pay

## 2024-04-26 DIAGNOSIS — M7662 Achilles tendinitis, left leg: Secondary | ICD-10-CM | POA: Diagnosis not present

## 2024-04-26 DIAGNOSIS — Z7409 Other reduced mobility: Secondary | ICD-10-CM | POA: Insufficient documentation

## 2024-04-26 DIAGNOSIS — M25672 Stiffness of left ankle, not elsewhere classified: Secondary | ICD-10-CM | POA: Diagnosis present

## 2024-04-26 DIAGNOSIS — M79671 Pain in right foot: Secondary | ICD-10-CM | POA: Diagnosis present

## 2024-04-26 DIAGNOSIS — M25671 Stiffness of right ankle, not elsewhere classified: Secondary | ICD-10-CM | POA: Insufficient documentation

## 2024-04-26 DIAGNOSIS — M79672 Pain in left foot: Secondary | ICD-10-CM | POA: Insufficient documentation

## 2024-04-26 DIAGNOSIS — M7661 Achilles tendinitis, right leg: Secondary | ICD-10-CM | POA: Insufficient documentation

## 2024-04-27 ENCOUNTER — Ambulatory Visit (HOSPITAL_COMMUNITY)

## 2024-05-01 ENCOUNTER — Encounter (HOSPITAL_COMMUNITY): Payer: Self-pay

## 2024-05-01 ENCOUNTER — Ambulatory Visit (HOSPITAL_COMMUNITY)

## 2024-05-01 DIAGNOSIS — M79671 Pain in right foot: Secondary | ICD-10-CM

## 2024-05-01 DIAGNOSIS — M25671 Stiffness of right ankle, not elsewhere classified: Secondary | ICD-10-CM

## 2024-05-01 DIAGNOSIS — Z7409 Other reduced mobility: Secondary | ICD-10-CM

## 2024-05-01 NOTE — Therapy (Signed)
 OUTPATIENT PHYSICAL THERAPY LOWER EXTREMITY TREATMENT   Patient Name: Peggy French MRN: [id number] DOB:1940-12-05, 83 y.o., female Today's Date: 05/01/2024  END OF SESSION:  PT End of Session - 05/01/24 1430     Visit Number 2    Number of Visits 13    Date for Recertification  05/24/24    Authorization Type [employer] Medicare    Authorization Time Period auth requested    Progress Note Due on Visit 10    PT Start Time 1430   Patient arrived late to her appointment.   PT Stop Time 1514    PT Time Calculation (min) 44 min    Activity Tolerance Patient tolerated treatment well    Behavior During Therapy [facility] for tasks assessed/performed           Past Medical History:  Diagnosis Date   Asthmatic bronchitis 2011   Colon carcinoma metastatic to 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   a. s/p right hemicolectomy/chemo 2013 with mets to the lung s/p LLL wedge resection 2014.   Diabetes mellitus    x over 10 yrs   GERD (gastroesophageal reflux disease)    Gout    H/O hiatal hernia    History of colon cancer    History of shingles    Hyperlipidemia    Hypertension    EKG 10/12 EPIC, chest- 1 view  6/13 EPIC    Last 2D Echo on 05/02/2012 showed EF of greater than 55%   Microcytic anemia    Mild CAD    a. cath by Dr. Mona in 03/2011 - 20-30% ostial bifurcation stenosis of LAD, otherwise only mild luminal irregularities in LAD/RCA, LVEF 60%, no RWMA.   c. 06/18/16 LHC after abnormal nuc showed mild non obst CAD   Neuropathy    Obesity    Past Surgical History:  Procedure Laterality Date   ABDOMINAL HYSTERECTOMY     APPENDECTOMY     CARDIAC CATHETERIZATION  2001,2012   CARDIAC CATHETERIZATION N/A 06/18/2016   Procedure: Left Heart Cath and Coronary Angiography;  Surgeon: Lonni Hanson, MD;  Location: [facility] INVASIVE CV LAB;  Service: Cardiovascular;  Laterality: N/A;   CATARACT EXTRACTION W/PHACO Left 02/17/2015   Procedure: CATARACT EXTRACTION PHACO AND INTRAOCULAR LENS PLACEMENT (IOC);   Surgeon: Cherene Mania, MD;  Location: [facility];  Service: Ophthalmology;  Laterality: Left;  CDE:8.01    CATARACT EXTRACTION W/PHACO Right 08/03/2021   Procedure: CATARACT EXTRACTION PHACO AND INTRAOCULAR LENS PLACEMENT (IOC);  Surgeon: Harrie Agent, MD;  Location: [facility];  Service: Ophthalmology;  Laterality: Right;  CDE: 13.97   CHOLECYSTECTOMY     COLON SURGERY     PARTIAL COLECTOMY 30 YRS / AGAIN 2013   COLONOSCOPY  11/26/2011   Procedure: COLONOSCOPY;  Surgeon: Claudis RAYMOND Rivet, MD;  Location: [facility];  Service: Endoscopy;  Laterality: N/A;  2:15   COLONOSCOPY N/A 12/08/2012   Procedure: COLONOSCOPY;  Surgeon: Claudis RAYMOND Rivet, MD;  Location: [facility];  Service: Endoscopy;  Laterality: N/A;  815-moved to 0950 Ann to notify pt   COLONOSCOPY N/A 03/25/2016   Procedure: COLONOSCOPY;  Surgeon: Claudis RAYMOND Rivet, MD;  Location: [facility];  Service: Endoscopy;  Laterality: N/A;  1200   COLONOSCOPY WITH PROPOFOL  N/A 07/29/2021   Procedure: COLONOSCOPY WITH PROPOFOL ;  Surgeon: Rivet Claudis RAYMOND, MD;  Location: [facility];  Service: Endoscopy;  Laterality: N/A;  11:10   ESOPHAGOGASTRODUODENOSCOPY  06/16/2011   Procedure: ESOPHAGOGASTRODUODENOSCOPY (EGD);  Surgeon: Claudis RAYMOND Rivet, MD;  Location: [facility];  Service: Endoscopy;  Laterality: N/A;  2:15   POLYPECTOMY  07/29/2021   Procedure: POLYPECTOMY;  Surgeon: Golda Claudis PENNER, MD;  Location: [facility];  Service: Endoscopy;;  transverse colon   PORT-A-CATH REMOVAL N/A 06/04/2014   Procedure: REMOVAL PORT-A-CATH;  Surgeon: Donnice KATHEE Lunger, MD;  Location: [facility];  Service: General;  Laterality: N/A;   PORTACATH PLACEMENT  02/22/2012   Procedure: INSERTION PORT-A-CATH;  Surgeon: Donnice KATHEE Lunger, MD;  Location: [facility];  Service: General;  Laterality: N/A;  left subclavian   VIDEO ASSISTED THORACOSCOPY (VATS)/WEDGE RESECTION Left 03/27/2013   Procedure: VIDEO ASSISTED THORACOSCOPY (VATS)/WEDGE RESECTION;  Surgeon: Dallas KATHEE Jude, MD;   Location: [facility] OR;  Service: Thoracic;  Laterality: Left;   VIDEO BRONCHOSCOPY N/A 03/27/2013   Procedure: VIDEO BRONCHOSCOPY;  Surgeon: [place] KATHEE Jude, MD;  Location: [facility] OR;  Service: Thoracic;  Laterality: N/A;   Patient Active Problem List   Diagnosis Date Noted   PAD (peripheral artery disease) 11/15/2023   GERD (gastroesophageal reflux disease) 09/29/2023   Pain due to onychomycosis of toenails of both feet 04/28/2022   Chest pain 09/29/2020   Moderate risk chest pain 06/24/2016   Mixed hyperlipidemia 06/24/2016   Unstable angina (HCC) 06/19/2016   Mild CAD    Chest pain with moderate risk of acute coronary syndrome 06/17/2015   Type 2 diabetes mellitus with obesity 06/17/2015   Obesity (BMI 30-39.9)    Colon carcinoma metastatic to 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   S/P right colectomy 01/07/2012   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 She notes that she did not sleep well last night and she thinks that is the reason she feels weak today.   Eval: Pt reports pain has been going on a while. Reports they told her she had bone spurs on both sides. Reports her ankle pain flares up every now and then. Reports she feels burning in calves sometimes.  PERTINENT HISTORY: 83 y.o. female presents with the above complaint. History confirmed with patient.  Her diabetes is well-controlled nails are thickened elongated and causing discomfort.  Previous debridement was helpful.   Blood sugar remains well-controlled.  Her Achilles tendon pain has returned and is worse in the back of both heels.  Physical Exam: warm, good capillary refill, no trophic changes or ulcerative lesions, normal DP and PT pulses, and abnormal sensory exam.  Pain to palpation of the insertion of the posterior Achilles bilateral Left Foot: dystrophic yellowed discolored nail plates with subungual debris Right Foot: dystrophic yellowed discolored nail plates with subungual debris.   Pt report history of vertigo DM Gout Neuropathy Cardiac issues  PAIN:  Are you having pain? Yes: NPRS scale: 6/10,  can go up to 9/10 Pain location: back of heels, gastrocs Pain description: Burning pain  Aggravating factors: Sitting too long Relieving factors: raising feet on bed   PRECAUTIONS: None  RED FLAGS: None Pt reports neuropathy in feet bilaterally due to diabetes, and cramping in hands  bilaterally, B/B issues due to previous colon cancer  WEIGHT BEARING RESTRICTIONS: No  FALLS:  Has patient fallen in last 6 months? No, reports 2 near misses just today  LIVING ENVIRONMENT: Lives with: lives with their son, caregiver for son who has dementia Lives in: House/apartment Stairs: No has ramp, one step into laundry area. Has following equipment at home: Single point cane  OCCUPATION: N/A  PLOF: Independent but gets Meals on Wheels  PATIENT GOALS:  To get back to where I can help myself and help my son, being more active, go to church   NEXT MD VISIT: January 2026  OBJECTIVE:  Note: Objective measures were completed at Evaluation unless otherwise noted.  DIAGNOSTIC FINDINGS: N/A  PATIENT SURVEYS:  LEFS  Extreme difficulty/unable (0), Quite a bit of difficulty (1), Moderate difficulty (2), Little difficulty (3), No difficulty (4) Survey date:    Any of your usual work, housework or school activities   2. Usual hobbies, recreational or sporting activities   3. Getting into/out of the  bath   4. Walking between rooms   5. Putting on socks/shoes   6. Squatting    7. Lifting an object, like a bag of groceries from the floor   8. Performing light activities around your home   9. Performing heavy activities around your home   10. Getting into/out of a car   11. Walking 2 blocks   12. Walking 1 mile   13. Going up/down 10 stairs (1 flight)   14. Standing for 1 hour   15.  sitting for 1 hour   16. Running on even ground   17. Running on uneven ground   18. Making sharp turns while running fast   19. Hopping    20. Rolling over in bed   Score total:  Lower Extremity Functional Score: 26 / 80 = 32.5 %      COGNITION: Overall cognitive status: Within functional limits for tasks assessed     SENSATION: WFL  POSTURE: rounded shoulders and forward head   LOWER EXTREMITY ROM:  Active ROM Right eval Left eval  Hip flexion    Hip extension    Hip abduction    Hip adduction    Hip internal rotation    Hip external rotation    Knee flexion    Knee extension    Ankle dorsiflexion 3 7  Ankle plantarflexion 40 42  Ankle inversion    Ankle eversion     (Blank rows = not tested)  LOWER EXTREMITY MMT:  MMT Right eval Left eval  Hip flexion    Hip extension    Hip abduction    Hip adduction    Hip internal rotation    Hip external rotation    Knee flexion    Knee extension 4 * burning pain 4 * burning pain   Ankle dorsiflexion 5 5  Ankle plantarflexion    Ankle inversion    Ankle eversion     (Blank rows = not tested)  LOWER EXTREMITY SPECIAL TESTS:    FUNCTIONAL TESTS:  30 seconds chair stand test Timed up and go (TUG): Next session 2 minute walk test: next session  30 second chair stand: 10 STS, slight burning in heels GAIT: Distance walked: 100 ft in session  Assistive device utilized: Single point cane Level of assistance: Modified independence Comments: SPC in R hand, dec velocity, WBOS, dec hip flex/ext bilat, dec stance time on LLE,  dec IC/DF and push off/PF moments  bilat                                                                                                                                TREATMENT DATE:                                    05/01/24 EXERCISE LOG  Exercise Repetitions and Resistance Comments  Nustep  L3 x 5 minutes   Seated heel/toe raises   2.5 minutes  Alternating heel and toe raise   LAQ  2# x 2.5 minutes  Alternating LE   Sit to stand   10 reps  With UE support from armrests   Standing gastroc stretch  2 minutes    TUG  25.99 seconds w/ quad cane    2 MWT  217 feet w/ quad cane    Blank cell = exercise not performed today   04/26/24: PT Eval and HEP    PATIENT EDUCATION:  Education details: objective testing, HEP, healing, and goals for physical therapy Person educated: Patient Education method: Explanation and Demonstration Education comprehension: verbalized understanding and returned demonstration  HOME EXERCISE PROGRAM: Access Code: [email or web address] URL: [email or web address] Date: 05/01/2024 Prepared by: Lacinda Fass  Exercises - Sit to Stand  - 1 x daily - 7 x weekly - 3 sets - 10 reps - Seated Heel Toe Raises  - 1 x daily - 7 x weekly - 3 sets - 10 reps - Seated Gastroc Stretch with Strap  - 1 x daily - 7 x weekly - 3-4 sets - 30 seconds hold  Access Code: [email or web address] URL: [email or web address] Date: 04/26/2024 Prepared by: Rosaria Powell-Butler  Exercises - Heel Raises with Counter Support  - 2 x daily - 7 x weekly - 2 sets - 10 reps - Heel Toe Raises with Counter Support  - 2 x daily - 7 x weekly - 2 sets - 10 reps - Seated Dorsiflexion Stretch  - 2 x daily - 7 x weekly - 2 sets - 10 reps - 5 hold  ASSESSMENT:  CLINICAL IMPRESSION: Patient was introduced to multiple new interventions for improved lower extremity strength and functional mobility. She completed a timed up and go and two minute walk test and she exhibited impairments with these  tests as she was below her age related norms. Her HEP was reviewed and updated. She reported feeling comfortable with these interventions. She noted that her pain went from a 6/10 to a 4/10 upon the conclusion of treatment. Patient continues to require skilled physical therapy to address her remaining impairments to return to her prior level of function.    Eval: Patient is a 83 y.o. female who was seen today for physical therapy evaluation and treatment for  M76.61,M76.62 (ICD-10-CM) - Achilles tendinitis of both lower extremities  . On this date, patient demonstrates impaired self perception of function, decreased ankle  ROM, decreased strength and decreased endurance, all of which may be contributing to patients altered gait pattern, increased pain, and difficulty with functional transfers. Educated patient on proper shoe wear as she is wearing very flat shoes. Explained how shoes with better arch support may help to limit her discomfort in her heels. Plan to give p print off examples in next sessions. Time limited during evaluation due to late arrival. Patient will benefit from skilled physical therapy in order to address current deficits to improve gait mechanics, balance, pain, and overall function to improved quality of life.   OBJECTIVE IMPAIRMENTS: Abnormal gait, cardiopulmonary status limiting activity, decreased activity tolerance, decreased balance, decreased endurance, decreased mobility, difficulty walking, decreased ROM, decreased strength, dizziness, impaired flexibility, improper body mechanics, postural dysfunction, and pain.   ACTIVITY LIMITATIONS: lifting, bending, sitting, standing, squatting, stairs, and transfers  PARTICIPATION LIMITATIONS: meal prep, cleaning, laundry, and community activity  PERSONAL FACTORS: N/A are also affecting patient's functional outcome.   REHAB POTENTIAL: Good  CLINICAL DECISION MAKING: Stable/uncomplicated  EVALUATION COMPLEXITY:  Low   GOALS: Goals reviewed with patient? No  SHORT TERM GOALS: Target date: 05/17/24 Patient will be independent with performance of HEP to demonstrate adequate self management of symptoms.  Baseline:  Goal status: INITIAL  2.   Patient will report at least a 25% improvement with function and/or pain reduction overall since beginning PT. Baseline:  Goal status: INITIAL   LONG TERM GOALS: Target date: 06/07/24 Patient will improve LEFS score by 9 points to demonstrate improved perceived function while meeting MCID.  Baseline: Goal status: INITIAL 2.  Patient will improve  ankle DF  ROM bilaterally to at least 10 degrees to demonstrate improved LE mobility needed for functional transfers and gait mechanics.  Baseline:  Goal status: INITIAL 3.  Patient will perform TUG in 12 seconds or less to demonstrate decreased risk of falls.  Baseline:  Goal status: INITIAL   4.  Patient will improve 30 second chair stand test by at least 2 STS in order to demonstrate improved LE strength/power needed for community ambulation.  Baseline:  Goal status: INITIAL  5. Patient will report daily pain at 4/10 or less with conclusion of her daily activities to demonstrate Improved activity tolerance and pain reduction.  Baseline:  Goal status: INITIAL   PLAN:  PT FREQUENCY: 2x/week  PT DURATION: 6 weeks  PLANNED INTERVENTIONS: [id number]- PT Re-evaluation, 97110-Therapeutic exercises, 97530- Therapeutic activity, [id number]- Neuromuscular re-education, 97535- Self Care, [id number]- Manual therapy, [id number]- Gait training, [phone]- Electrical stimulation (manual), [id number]- Ultrasound, [id number]- Traction (mechanical), [phone] (1-2 muscles), [id number] (3+ muscles)- Dry Needling, Patient/Family education, Balance training, Stair training, Taping, Joint mobilization, Spinal mobilization, Cryotherapy, and Moist heat  PLAN FOR NEXT SESSION: Review HEP and goals. test, balance assessment (FGA/DGI), Give print off examples of shoes  with good arch support, prog ROM and strengthening activities.    Lacinda Fass, PT, DPT  3:30 PM, 05/01/24

## 2024-05-03 ENCOUNTER — Ambulatory Visit (HOSPITAL_COMMUNITY)

## 2024-05-06 ENCOUNTER — Ambulatory Visit: Payer: Self-pay | Admitting: Internal Medicine

## 2024-05-08 ENCOUNTER — Ambulatory Visit (HOSPITAL_COMMUNITY)

## 2024-05-16 ENCOUNTER — Ambulatory Visit (HOSPITAL_COMMUNITY): Attending: Podiatry

## 2024-05-16 ENCOUNTER — Encounter (HOSPITAL_COMMUNITY): Payer: Self-pay

## 2024-05-16 DIAGNOSIS — M79671 Pain in right foot: Secondary | ICD-10-CM | POA: Insufficient documentation

## 2024-05-16 DIAGNOSIS — Z7409 Other reduced mobility: Secondary | ICD-10-CM | POA: Diagnosis present

## 2024-05-16 DIAGNOSIS — M25671 Stiffness of right ankle, not elsewhere classified: Secondary | ICD-10-CM | POA: Diagnosis present

## 2024-05-16 DIAGNOSIS — M25672 Stiffness of left ankle, not elsewhere classified: Secondary | ICD-10-CM | POA: Diagnosis present

## 2024-05-16 DIAGNOSIS — M79672 Pain in left foot: Secondary | ICD-10-CM | POA: Insufficient documentation

## 2024-05-16 NOTE — Therapy (Signed)
 OUTPATIENT PHYSICAL THERAPY LOWER EXTREMITY TREATMENT   Patient Name: Peggy French MRN: [id number] DOB:September 11, 1940, 83 y.o., female Today's Date: 05/16/2024  END OF SESSION:  PT End of Session - 05/16/24 1248     Visit Number 3    Number of Visits 13    Date for Recertification  05/24/24    Authorization Type BCBS Medicare    Progress Note Due on Visit 10    PT Start Time 1248    PT Stop Time 1327    PT Time Calculation (min) 39 min    Activity Tolerance Patient tolerated treatment well    Behavior During Therapy [facility] for tasks assessed/performed           Past Medical History:  Diagnosis Date   Asthmatic bronchitis 2011   Colon carcinoma metastatic to 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   a. s/p right hemicolectomy/chemo 2013 with mets to the lung s/p LLL wedge resection 2014.   Diabetes mellitus    x over 10 yrs   GERD (gastroesophageal reflux disease)    Gout    H/O hiatal hernia    History of colon cancer    History of shingles    Hyperlipidemia    Hypertension    EKG 10/12 EPIC, chest- 1 view  6/13 EPIC    Last 2D Echo on 05/02/2012 showed EF of greater than 55%   Microcytic anemia    Mild CAD    a. cath by Dr. Mona in 03/2011 - 20-30% ostial bifurcation stenosis of LAD, otherwise only mild luminal irregularities in LAD/RCA, LVEF 60%, no RWMA.   c. 06/18/16 LHC after abnormal nuc showed mild non obst CAD   Neuropathy    Obesity    Past Surgical History:  Procedure Laterality Date   ABDOMINAL HYSTERECTOMY     APPENDECTOMY     CARDIAC CATHETERIZATION  2001,2012   CARDIAC CATHETERIZATION N/A 06/18/2016   Procedure: Left Heart Cath and Coronary Angiography;  Surgeon: Lonni Hanson, MD;  Location: [facility] INVASIVE CV LAB;  Service: Cardiovascular;  Laterality: N/A;   CATARACT EXTRACTION W/PHACO Left 02/17/2015   Procedure: CATARACT EXTRACTION PHACO AND INTRAOCULAR LENS PLACEMENT (IOC);  Surgeon: Cherene Mania, MD;  Location: [facility];  Service: Ophthalmology;  Laterality: Left;   CDE:8.01    CATARACT EXTRACTION W/PHACO Right 08/03/2021   Procedure: CATARACT EXTRACTION PHACO AND INTRAOCULAR LENS PLACEMENT (IOC);  Surgeon: Harrie Agent, MD;  Location: [facility];  Service: Ophthalmology;  Laterality: Right;  CDE: 13.97   CHOLECYSTECTOMY     COLON SURGERY     PARTIAL COLECTOMY 30 YRS / AGAIN 2013   COLONOSCOPY  11/26/2011   Procedure: COLONOSCOPY;  Surgeon: Claudis RAYMOND Rivet, MD;  Location: [facility];  Service: Endoscopy;  Laterality: N/A;  2:15   COLONOSCOPY N/A 12/08/2012   Procedure: COLONOSCOPY;  Surgeon: Claudis RAYMOND Rivet, MD;  Location: [facility];  Service: Endoscopy;  Laterality: N/A;  815-moved to 0950 Ann to notify pt   COLONOSCOPY N/A 03/25/2016   Procedure: COLONOSCOPY;  Surgeon: Claudis RAYMOND Rivet, MD;  Location: [facility];  Service: Endoscopy;  Laterality: N/A;  1200   COLONOSCOPY WITH PROPOFOL  N/A 07/29/2021   Procedure: COLONOSCOPY WITH PROPOFOL ;  Surgeon: Rivet Claudis RAYMOND, MD;  Location: [facility];  Service: Endoscopy;  Laterality: N/A;  11:10   ESOPHAGOGASTRODUODENOSCOPY  06/16/2011   Procedure: ESOPHAGOGASTRODUODENOSCOPY (EGD);  Surgeon: Claudis RAYMOND Rivet, MD;  Location: [facility];  Service: Endoscopy;  Laterality: N/A;  2:15   POLYPECTOMY  07/29/2021  Procedure: POLYPECTOMY;  Surgeon: Golda Claudis PENNER, MD;  Location: [facility];  Service: Endoscopy;;  transverse colon   PORT-A-CATH REMOVAL N/A 06/04/2014   Procedure: REMOVAL PORT-A-CATH;  Surgeon: Donnice KATHEE Lunger, MD;  Location: [facility];  Service: General;  Laterality: N/A;   PORTACATH PLACEMENT  02/22/2012   Procedure: INSERTION PORT-A-CATH;  Surgeon: Donnice KATHEE Lunger, MD;  Location: [facility];  Service: General;  Laterality: N/A;  left subclavian   VIDEO ASSISTED THORACOSCOPY (VATS)/WEDGE RESECTION Left 03/27/2013   Procedure: VIDEO ASSISTED THORACOSCOPY (VATS)/WEDGE RESECTION;  Surgeon: Dallas KATHEE Jude, MD;  Location: [facility] OR;  Service: Thoracic;  Laterality: Left;   VIDEO BRONCHOSCOPY N/A  03/27/2013   Procedure: VIDEO BRONCHOSCOPY;  Surgeon: Dallas KATHEE Jude, MD;  Location: [facility] OR;  Service: Thoracic;  Laterality: N/A;   Patient Active Problem List   Diagnosis Date Noted   PAD (peripheral artery disease) 11/15/2023   GERD (gastroesophageal reflux disease) 09/29/2023   Pain due to onychomycosis of toenails of both feet 04/28/2022   Chest pain 09/29/2020   Moderate risk chest pain 06/24/2016   Mixed hyperlipidemia 06/24/2016   Unstable angina (HCC) 06/19/2016   Mild CAD    Chest pain with moderate risk of acute coronary syndrome 06/17/2015   Type 2 diabetes mellitus with obesity 06/17/2015   Obesity (BMI 30-39.9)    Colon carcinoma metastatic to 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   S/P right colectomy 01/07/2012   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  Reports she has some pain in Rt heel, pain scale 6/10 sore and achy pain that is affecting her sleep.    Eval: Pt reports pain has been going on a while. Reports they told her she had bone spurs on both sides. Reports her ankle pain flares up every now and then. Reports she feels burning in calves sometimes.  PERTINENT HISTORY: 83 y.o. female presents with the above complaint. History confirmed with patient.  Her diabetes is well-controlled nails are thickened elongated and causing discomfort.  Previous debridement was helpful.  Blood sugar remains well-controlled.  Her Achilles tendon  pain has returned and is worse in the back of both heels.  Physical Exam: warm, good capillary refill, no trophic changes or ulcerative lesions, normal DP and PT pulses, and abnormal sensory exam.  Pain to palpation of the insertion of the posterior Achilles bilateral Left Foot: dystrophic yellowed discolored nail plates with subungual debris Right Foot: dystrophic yellowed discolored nail plates with subungual debris.   Pt report history of vertigo DM Gout Neuropathy Cardiac issues  PAIN:  Are you having pain? Yes: NPRS scale: 6/10,  can go up to 9/10 Pain location: back of heels, gastrocs Pain description: Burning pain  Aggravating factors: Sitting too long Relieving factors: raising feet on bed   PRECAUTIONS: None  RED FLAGS: None Pt reports neuropathy in feet bilaterally due to diabetes, and cramping in hands bilaterally, B/B issues due to previous colon cancer  WEIGHT BEARING RESTRICTIONS: No  FALLS:  Has patient fallen in last 6 months? No, reports 2 near misses just today  LIVING ENVIRONMENT: Lives with: lives with their son, caregiver for son who has dementia Lives in: House/apartment Stairs: No has ramp, one step into laundry area. Has following equipment at home: Single point cane  OCCUPATION: N/A  PLOF: Independent but gets Meals on Wheels  PATIENT GOALS:  To get back to where I can help myself and help my son, being more active, go to church   NEXT MD VISIT: January 2026  OBJECTIVE:  Note: Objective measures were completed at Evaluation unless otherwise noted.  DIAGNOSTIC FINDINGS: N/A  PATIENT SURVEYS:  LEFS  Extreme difficulty/unable (0), Quite a bit of difficulty (1), Moderate difficulty (2), Little difficulty (3), No difficulty (4) Survey date:    Any of your usual work, housework or school activities   2. Usual hobbies, recreational or sporting activities   3. Getting into/out of the bath   4. Walking between rooms   5. Putting on  socks/shoes   6. Squatting    7. Lifting an object, like a bag of groceries from the floor   8. Performing light activities around your home   9. Performing heavy activities around your home   10. Getting into/out of a car   11. Walking 2 blocks   12. Walking 1 mile   13. Going up/down 10 stairs (1 flight)   14. Standing for 1 hour   15.  sitting for 1 hour   16. Running on even ground   17. Running on uneven ground   18. Making sharp turns while running fast   19. Hopping    20. Rolling over in bed   Score total:  Lower Extremity Functional Score: 26 / 80 = 32.5 %      COGNITION: Overall cognitive status: Within functional limits for tasks assessed     SENSATION: WFL  POSTURE: rounded shoulders and forward head   LOWER EXTREMITY ROM:  Active ROM Right eval Left eval  Hip flexion    Hip extension    Hip abduction    Hip adduction    Hip internal rotation    Hip external rotation    Knee flexion    Knee extension    Ankle dorsiflexion 3 7  Ankle plantarflexion 40 42  Ankle inversion    Ankle eversion     (Blank rows = not tested)  LOWER EXTREMITY MMT:  MMT Right eval Left eval  Hip flexion    Hip extension    Hip abduction    Hip adduction    Hip internal rotation    Hip external rotation    Knee flexion    Knee extension 4 * burning pain 4 * burning pain   Ankle dorsiflexion 5 5  Ankle plantarflexion    Ankle inversion    Ankle eversion     (Blank rows = not tested)  LOWER EXTREMITY SPECIAL TESTS:    FUNCTIONAL TESTS:  30 seconds chair stand test Timed up and go (TUG): Next session 2 minute walk test: next session  30 second chair stand: 10 STS, slight burning in heels GAIT: Distance walked: 100 ft in session  Assistive device utilized: Single point cane Level of assistance: Modified independence Comments: SPC in R hand, dec velocity, WBOS, dec hip flex/ext bilat, dec stance time on LLE, dec IC/DF and push off/PF moments bilat  TREATMENT DATE:  11/05: Vitals: BP Lt UE in seated position: 102/69 mmHg, HR 84 bpm DGI 1. Gait level surface (2) Mild Impairment: Walks 20', uses assistive devices, slower speed, mild gait deviations. 2. Change in gait speed (2) Mild Impairment: Is able to change speed but demonstrates mild gait deviations, or not gait deviations but unable to achieve a significant change in velocity, or uses an assistive device. 3. Gait with horizontal head turns (1) Moderate Impairment: Performs head turns with moderate change in gait velocity, slows down, staggers but recovers, can continue to walk. 4. Gait with vertical head turns 1) Moderate Impairment: Performs head turns with moderate change in gait velocity, slows down, staggers but recovers, can continue to walk. 5. Gait and pivot turn (1) Moderate Impairment: Turns slowly, requires verbal cueing, requires several small steps to catch balance following turn and stop. 6. Step over obstacle (1) Moderate Impairment: Is able to step over box but must stop, then step over. May require verbal cueing. 7. Step around obstacles (1) Moderate Impairment: Is able to clear cones but must significantly slow, speed to accomplish task, or requires verbal cueing. 8. Stairs (1) Moderate Impairment: Two feet to a stair, must use rail.  TOTAL SCORE: 10 / 24  Heel raises incline slope 10x 3 2 sets Toe raises decline slope 10x3 Tandem stance 2x 30 Slant board 2x 30                                  05/01/24 EXERCISE LOG  Exercise Repetitions and Resistance Comments  Nustep  L3 x 5 minutes   Seated heel/toe raises   2.5 minutes  Alternating heel and toe raise   LAQ  2# x 2.5 minutes  Alternating LE   Sit to stand   10 reps  With UE support from armrests   Standing gastroc stretch  2 minutes    TUG  25.99 seconds w/ quad cane    2 MWT  217  feet w/ quad cane    Blank cell = exercise not performed today   04/26/24: PT Eval and HEP    PATIENT EDUCATION:  Education details: objective testing, HEP, healing, and goals for physical therapy Person educated: Patient Education method: Explanation and Demonstration Education comprehension: verbalized understanding and returned demonstration  HOME EXERCISE PROGRAM: Access Code: [email or web address] URL: [email or web address] Date: 05/01/2024 Prepared by: Lacinda Fass  Exercises - Sit to Stand  - 1 x daily - 7 x weekly - 3 sets - 10 reps - Seated Heel Toe Raises  - 1 x daily - 7 x weekly - 3 sets - 10 reps - Seated Gastroc Stretch with Strap  - 1 x daily - 7 x weekly - 3-4 sets - 30 seconds hold  Access Code: [email or web address] URL: [email or web address] Date: 04/26/2024 Prepared by: Rosaria Powell-Butler  Exercises - Heel Raises with Counter Support  - 2 x daily - 7 x weekly - 2 sets - 10 reps - Heel Toe Raises with Counter Support  - 2 x daily - 7 x weekly - 2 sets - 10 reps - Seated Dorsiflexion Stretch  - 2 x daily - 7 x weekly - 2 sets - 10 reps - 5 hold  ASSESSMENT:  CLINICAL IMPRESSION:  Pt limited by c/o chest pain initially this session and intermitter reports of dizziness that increases with vertical head directions.  Vitals checked initial session and monitored s/s through session.  DGI  complete with score 10/24 indicating pt with impaired balance without AD, encouraged to stay with QC for safety, verbalized understanding.  Session focus with ankle strengthening and balance training, pt required intermittent HHA during tandem stance and cueing for posture during tandem stance.  No reports of increased pain through session, was limited by fatigue with activities.  Eval: Patient is a 83 y.o. female who was seen today for physical therapy evaluation and treatment for  M76.61,M76.62 (ICD-10-CM) - Achilles tendinitis of both lower extremities  . On this date,  patient demonstrates impaired self perception of function, decreased ankle ROM, decreased strength and decreased endurance, all of which may be contributing to patients altered gait pattern, increased pain, and difficulty with functional transfers. Educated patient on proper shoe wear as she is wearing very flat shoes. Explained how shoes with better arch support may help to limit her discomfort in her heels. Plan to give p print off examples in next sessions. Time limited during evaluation due to late arrival. Patient will benefit from skilled physical therapy in order to address current deficits to improve gait mechanics, balance, pain, and overall function to improved quality of life.   OBJECTIVE IMPAIRMENTS: Abnormal gait, cardiopulmonary status limiting activity, decreased activity tolerance, decreased balance, decreased endurance, decreased mobility, difficulty walking, decreased ROM, decreased strength, dizziness, impaired flexibility, improper body mechanics, postural dysfunction, and pain.   ACTIVITY LIMITATIONS: lifting, bending, sitting, standing, squatting, stairs, and transfers  PARTICIPATION LIMITATIONS: meal prep, cleaning, laundry, and community activity  PERSONAL FACTORS: N/A are also affecting patient's functional outcome.   REHAB POTENTIAL: Good  CLINICAL DECISION MAKING: Stable/uncomplicated  EVALUATION COMPLEXITY: Low   GOALS: Goals reviewed with patient? No  SHORT TERM GOALS: Target date: 05/17/24 Patient will be independent with performance of HEP to demonstrate adequate self management of symptoms.  Baseline:  Goal status: INITIAL  2.   Patient will report at least a 25% improvement with function and/or pain reduction overall since beginning PT. Baseline:  Goal status: INITIAL   LONG TERM GOALS: Target date: 06/07/24 Patient will improve LEFS score by 9 points to demonstrate improved perceived function while meeting MCID.  Baseline: Goal status: INITIAL 2.   Patient will improve  ankle DF  ROM bilaterally to at least 10 degrees to demonstrate improved LE mobility needed for functional transfers and gait mechanics.  Baseline:  Goal status: INITIAL 3.  Patient will perform TUG in 12 seconds or less to demonstrate decreased risk of falls.  Baseline:  Goal status: INITIAL   4.  Patient will improve 30 second chair stand test by at least 2 STS in order to demonstrate improved LE strength/power needed for community ambulation.  Baseline:  Goal status: INITIAL  5. Patient will report daily pain at 4/10 or less with conclusion of her daily activities to demonstrate Improved activity tolerance and pain reduction.  Baseline:  Goal status: INITIAL   PLAN:  PT FREQUENCY: 2x/week  PT DURATION: 6 weeks  PLANNED INTERVENTIONS: [id number]- PT Re-evaluation, 97110-Therapeutic exercises, 97530- Therapeutic activity, [id number]- Neuromuscular re-education, 97535- Self Care, [id number]- Manual therapy, [id number]- Gait training, [phone]- Electrical stimulation (manual), [id number]- Ultrasound, 02987- Traction (mechanical), [phone] (1-2 muscles), 20561 (3+ muscles)- Dry Needling, Patient/Family education, Balance training, Stair training, Taping, Joint mobilization, Spinal mobilization, Cryotherapy, and Moist heat  PLAN FOR NEXT SESSION: Next session give print off examples of shoes with good arch support, prog ROM and strengthening activities.   Augustin Mclean, LPTA/CLT; CBIS [phone]  4:52 PM, 05/16/24

## 2024-05-17 ENCOUNTER — Ambulatory Visit (HOSPITAL_COMMUNITY)

## 2024-05-17 ENCOUNTER — Encounter (HOSPITAL_COMMUNITY): Payer: Self-pay

## 2024-05-17 DIAGNOSIS — M79671 Pain in right foot: Secondary | ICD-10-CM | POA: Diagnosis not present

## 2024-05-17 DIAGNOSIS — M25671 Stiffness of right ankle, not elsewhere classified: Secondary | ICD-10-CM

## 2024-05-17 DIAGNOSIS — Z7409 Other reduced mobility: Secondary | ICD-10-CM

## 2024-05-17 NOTE — Therapy (Signed)
 OUTPATIENT PHYSICAL THERAPY LOWER EXTREMITY TREATMENT   Patient Name: PAULYNE MOOTY MRN: [id number] DOB:11/04/1940, 83 y.o., female Today's Date: 05/17/2024  END OF SESSION:  PT End of Session - 05/17/24 1548     Visit Number 4    Number of Visits 13    Date for Recertification  05/24/24    Authorization Type BCBS Medicare    Authorization Time Period BCBS approved 13 visits from 04/26/2024-10/22/2024    Progress Note Due on Visit 10    PT Start Time 1553    PT Stop Time 1633    PT Time Calculation (min) 40 min    Activity Tolerance Patient tolerated treatment well    Behavior During Therapy [facility] for tasks assessed/performed           Past Medical History:  Diagnosis Date   Asthmatic bronchitis 2011   Colon carcinoma metastatic to 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   a. s/p right hemicolectomy/chemo 2013 with mets to the lung s/p LLL wedge resection 2014.   Diabetes mellitus    x over 10 yrs   GERD (gastroesophageal reflux disease)    Gout    H/O hiatal hernia    History of colon cancer    History of shingles    Hyperlipidemia    Hypertension    EKG 10/12 EPIC, chest- 1 view  6/13 EPIC    Last 2D Echo on 05/02/2012 showed EF of greater than 55%   Microcytic anemia    Mild CAD    a. cath by Dr. Mona in 03/2011 - 20-30% ostial bifurcation stenosis of LAD, otherwise only mild luminal irregularities in LAD/RCA, LVEF 60%, no RWMA.   c. 06/18/16 LHC after abnormal nuc showed mild non obst CAD   Neuropathy    Obesity    Past Surgical History:  Procedure Laterality Date   ABDOMINAL HYSTERECTOMY     APPENDECTOMY     CARDIAC CATHETERIZATION  2001,2012   CARDIAC CATHETERIZATION N/A 06/18/2016   Procedure: Left Heart Cath and Coronary Angiography;  Surgeon: Lonni Hanson, MD;  Location: [facility] INVASIVE CV LAB;  Service: Cardiovascular;  Laterality: N/A;   CATARACT EXTRACTION W/PHACO Left 02/17/2015   Procedure: CATARACT EXTRACTION PHACO AND INTRAOCULAR LENS PLACEMENT (IOC);  Surgeon: Cherene Mania, MD;  Location: [facility];  Service: Ophthalmology;  Laterality: Left;  CDE:8.01    CATARACT EXTRACTION W/PHACO Right 08/03/2021   Procedure: CATARACT EXTRACTION PHACO AND INTRAOCULAR LENS PLACEMENT (IOC);  Surgeon: Harrie Agent, MD;  Location: [facility];  Service: Ophthalmology;  Laterality: Right;  CDE: 13.97   CHOLECYSTECTOMY     COLON SURGERY     PARTIAL COLECTOMY 30 YRS / AGAIN 2013   COLONOSCOPY  11/26/2011   Procedure: COLONOSCOPY;  Surgeon: Claudis RAYMOND Rivet, MD;  Location: [facility];  Service: Endoscopy;  Laterality: N/A;  2:15   COLONOSCOPY N/A 12/08/2012   Procedure: COLONOSCOPY;  Surgeon: Claudis RAYMOND Rivet, MD;  Location: [facility];  Service: Endoscopy;  Laterality: N/A;  815-moved to 0950 Ann to notify pt   COLONOSCOPY N/A 03/25/2016   Procedure: COLONOSCOPY;  Surgeon: Claudis RAYMOND Rivet, MD;  Location: [facility];  Service: Endoscopy;  Laterality: N/A;  1200   COLONOSCOPY WITH PROPOFOL  N/A 07/29/2021   Procedure: COLONOSCOPY WITH PROPOFOL ;  Surgeon: Rivet Claudis RAYMOND, MD;  Location: [facility];  Service: Endoscopy;  Laterality: N/A;  11:10   ESOPHAGOGASTRODUODENOSCOPY  06/16/2011   Procedure: ESOPHAGOGASTRODUODENOSCOPY (EGD);  Surgeon: Claudis RAYMOND Rivet, MD;  Location: [facility];  Service: Endoscopy;  Laterality: N/A;  2:15   POLYPECTOMY  07/29/2021   Procedure: POLYPECTOMY;  Surgeon: Golda Claudis PENNER, MD;  Location: [facility];  Service: Endoscopy;;  transverse colon   PORT-A-CATH REMOVAL N/A 06/04/2014   Procedure: REMOVAL PORT-A-CATH;  Surgeon: Donnice KATHEE Lunger, MD;  Location: [facility];  Service: General;  Laterality: N/A;   PORTACATH PLACEMENT  02/22/2012   Procedure: INSERTION PORT-A-CATH;  Surgeon: Donnice KATHEE Lunger, MD;  Location: [facility];  Service: General;  Laterality: N/A;  left subclavian   VIDEO ASSISTED THORACOSCOPY (VATS)/WEDGE RESECTION Left 03/27/2013   Procedure: VIDEO ASSISTED THORACOSCOPY (VATS)/WEDGE RESECTION;  Surgeon: Dallas KATHEE Jude, MD;  Location: [facility]  OR;  Service: Thoracic;  Laterality: Left;   VIDEO BRONCHOSCOPY N/A 03/27/2013   Procedure: VIDEO BRONCHOSCOPY;  Surgeon: Dallas KATHEE Jude, MD;  Location: [facility] OR;  Service: Thoracic;  Laterality: N/A;   Patient Active Problem List   Diagnosis Date Noted   PAD (peripheral artery disease) 11/15/2023   GERD (gastroesophageal reflux disease) 09/29/2023   Pain due to onychomycosis of toenails of both feet 04/28/2022   Chest pain 09/29/2020   Moderate risk chest pain 06/24/2016   Mixed hyperlipidemia 06/24/2016   Unstable angina (HCC) 06/19/2016   Mild CAD    Chest pain with moderate risk of acute coronary syndrome 06/17/2015   Type 2 diabetes mellitus with obesity 06/17/2015   Obesity (BMI 30-39.9)    Colon carcinoma metastatic to 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   S/P right colectomy 01/07/2012   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  No reports of increased pain following last session.  Eval: Pt reports pain has been going on a while. Reports they told her she had bone spurs on both sides. Reports her ankle pain flares up every now and then. Reports she feels burning in calves sometimes.  PERTINENT HISTORY: 83 y.o. female presents with the above complaint. History confirmed with patient.  Her diabetes is well-controlled nails are thickened elongated and causing discomfort.  Previous debridement was helpful.  Blood sugar remains  well-controlled.  Her Achilles tendon pain has returned and is worse in the back of both heels.  Physical Exam: warm, good capillary refill, no trophic changes or ulcerative lesions, normal DP and PT pulses, and abnormal sensory exam.  Pain to palpation of the insertion of the posterior Achilles bilateral Left Foot: dystrophic yellowed discolored nail plates with subungual debris Right Foot: dystrophic yellowed discolored nail plates with subungual debris.   Pt report history of vertigo DM Gout Neuropathy Cardiac issues  PAIN:  Are you having pain? Yes: NPRS scale: 6/10,  can go up to 9/10 Pain location: back of heels, gastrocs Pain description: Burning pain  Aggravating factors: Sitting too long Relieving factors: raising feet on bed   PRECAUTIONS: None  RED FLAGS: None Pt reports neuropathy in feet bilaterally due to diabetes, and cramping in hands bilaterally, B/B issues due to previous colon cancer  WEIGHT BEARING RESTRICTIONS: No  FALLS:  Has patient fallen in last 6 months? No, reports 2 near misses just today  LIVING ENVIRONMENT: Lives with: lives with their son, caregiver for son who has dementia Lives in: House/apartment Stairs: No has ramp, one step into laundry area. Has following equipment at home: Single point cane  OCCUPATION: N/A  PLOF: Independent but gets Meals on Wheels  PATIENT GOALS:  To get back to where I can help myself and help my son, being more active, go to church   NEXT MD VISIT: January 2026  OBJECTIVE:  Note: Objective measures were completed at Evaluation unless otherwise noted.  DIAGNOSTIC FINDINGS: N/A  PATIENT SURVEYS:  LEFS  Extreme difficulty/unable (0), Quite a bit of difficulty (1), Moderate difficulty (2), Little difficulty (3), No difficulty (4) Survey date:    Any of your usual work, housework or school activities   2. Usual hobbies, recreational or sporting activities   3. Getting into/out of the bath   4. Walking  between rooms   5. Putting on socks/shoes   6. Squatting    7. Lifting an object, like a bag of groceries from the floor   8. Performing light activities around your home   9. Performing heavy activities around your home   10. Getting into/out of a car   11. Walking 2 blocks   12. Walking 1 mile   13. Going up/down 10 stairs (1 flight)   14. Standing for 1 hour   15.  sitting for 1 hour   16. Running on even ground   17. Running on uneven ground   18. Making sharp turns while running fast   19. Hopping    20. Rolling over in bed   Score total:  Lower Extremity Functional Score: 26 / 80 = 32.5 %      COGNITION: Overall cognitive status: Within functional limits for tasks assessed     SENSATION: WFL  POSTURE: rounded shoulders and forward head   LOWER EXTREMITY ROM:  Active ROM Right eval Left eval  Hip flexion    Hip extension    Hip abduction    Hip adduction    Hip internal rotation    Hip external rotation    Knee flexion    Knee extension    Ankle dorsiflexion 3 7  Ankle plantarflexion 40 42  Ankle inversion    Ankle eversion     (Blank rows = not tested)  LOWER EXTREMITY MMT:  MMT Right eval Left eval  Hip flexion    Hip extension    Hip abduction    Hip adduction    Hip internal rotation    Hip external rotation    Knee flexion    Knee extension 4 * burning pain 4 * burning pain   Ankle dorsiflexion 5 5  Ankle plantarflexion    Ankle inversion    Ankle eversion     (Blank rows = not tested)  LOWER EXTREMITY SPECIAL TESTS:    FUNCTIONAL TESTS:  30 seconds chair stand test Timed up and go (TUG): Next session 2 minute walk test: next session  30 second chair stand: 10 STS, slight burning in heels GAIT: Distance walked: 100 ft in session  Assistive device utilized: Single point cane Level of assistance: Modified independence Comments: SPC in R hand, dec velocity, WBOS, dec hip flex/ext bilat, dec stance time on LLE, dec IC/DF and push  off/PF moments bilat  TREATMENT DATE:  05/17/24 Standing: Heel raises 10x Toe raises 10x  Tandem stance 2x 30 SLS Rt 7, Lt 2 Sidestep 2RT with QC  Slant board 3x 30  Discussed supportive shoes with recommendations including higher heel to toe drop, good shock absorportion, stability to assist with controlled movements side to side; recommended brands and stores to shop at  Seated BAPS L3 10 Df/Pf then Inv/Ev with therapist controlling knee   11/05: Vitals: BP Lt UE in seated position: 102/69 mmHg, HR 84 bpm DGI 1. Gait level surface (2) Mild Impairment: Walks 20', uses assistive devices, slower speed, mild gait deviations. 2. Change in gait speed (2) Mild Impairment: Is able to change speed but demonstrates mild gait deviations, or not gait deviations but unable to achieve a significant change in velocity, or uses an assistive device. 3. Gait with horizontal head turns (1) Moderate Impairment: Performs head turns with moderate change in gait velocity, slows down, staggers but recovers, can continue to walk. 4. Gait with vertical head turns 1) Moderate Impairment: Performs head turns with moderate change in gait velocity, slows down, staggers but recovers, can continue to walk. 5. Gait and pivot turn (1) Moderate Impairment: Turns slowly, requires verbal cueing, requires several small steps to catch balance following turn and stop. 6. Step over obstacle (1) Moderate Impairment: Is able to step over box but must stop, then step over. May require verbal cueing. 7. Step around obstacles (1) Moderate Impairment: Is able to clear cones but must significantly slow, speed to accomplish task, or requires verbal cueing. 8. Stairs (1) Moderate Impairment: Two feet to a stair, must use rail.  TOTAL SCORE: 10 / 24  Heel raises incline slope 10x 3 2 sets Toe  raises decline slope 10x3 Tandem stance 2x 30 Slant board 2x 30                                  05/01/24 EXERCISE LOG  Exercise Repetitions and Resistance Comments  Nustep  L3 x 5 minutes   Seated heel/toe raises   2.5 minutes  Alternating heel and toe raise   LAQ  2# x 2.5 minutes  Alternating LE   Sit to stand   10 reps  With UE support from armrests   Standing gastroc stretch  2 minutes    TUG  25.99 seconds w/ quad cane    2 MWT  217 feet w/ quad cane    Blank cell = exercise not performed today   04/26/24: PT Eval and HEP    PATIENT EDUCATION:  Education details: objective testing, HEP, healing, and goals for physical therapy 05/17/24:  Discussed what to look for in a shoe to support and pain control.   Person educated: Patient Education method: Medical Illustrator Education comprehension: verbalized understanding and returned demonstration  HOME EXERCISE PROGRAM: Access Code: [email or web address] URL: [email or web address] Date: 05/01/2024 Prepared by: Lacinda Fass  Exercises - Sit to Stand  - 1 x daily - 7 x weekly - 3 sets - 10 reps - Seated Heel Toe Raises  - 1 x daily - 7 x weekly - 3 sets - 10 reps - Seated Gastroc Stretch with Strap  - 1 x daily - 7 x weekly - 3-4 sets - 30 seconds hold  Access Code: [email or web address] URL: [email or web address] Date: 04/26/2024 Prepared by: Rosaria Powell-Butler  Exercises - Heel Raises with Counter Support  - 2 x daily - 7 x  weekly - 2 sets - 10 reps - Heel Toe Raises with Counter Support  - 2 x daily - 7 x weekly - 2 sets - 10 reps - Seated Dorsiflexion Stretch  - 2 x daily - 7 x weekly - 2 sets - 10 reps - 5 hold  ASSESSMENT:  CLINICAL IMPRESSION:  Session focus with ankle mobility, strengthening and balance training.  Added balance activities with cueing for posture and to find focal point to assist with static balance and during active balance gait exercises, intermittent HHA required for LOB during  SLS and tandem stance.  Added BAPS board to improve ankle mobility, required verbal cueing for mechanics and therapist had to control knee to reduce risk of compensations.  Educated on proper shoes for pain control and given printout with verbalized understanding.  No reports of increased pain, was limited by fatigue at appropriate levels.  Eval: Patient is a 83 y.o. female who was seen today for physical therapy evaluation and treatment for  M76.61,M76.62 (ICD-10-CM) - Achilles tendinitis of both lower extremities  . On this date, patient demonstrates impaired self perception of function, decreased ankle ROM, decreased strength and decreased endurance, all of which may be contributing to patients altered gait pattern, increased pain, and difficulty with functional transfers. Educated patient on proper shoe wear as she is wearing very flat shoes. Explained how shoes with better arch support may help to limit her discomfort in her heels. Plan to give p print off examples in next sessions. Time limited during evaluation due to late arrival. Patient will benefit from skilled physical therapy in order to address current deficits to improve gait mechanics, balance, pain, and overall function to improved quality of life.   OBJECTIVE IMPAIRMENTS: Abnormal gait, cardiopulmonary status limiting activity, decreased activity tolerance, decreased balance, decreased endurance, decreased mobility, difficulty walking, decreased ROM, decreased strength, dizziness, impaired flexibility, improper body mechanics, postural dysfunction, and pain.   ACTIVITY LIMITATIONS: lifting, bending, sitting, standing, squatting, stairs, and transfers  PARTICIPATION LIMITATIONS: meal prep, cleaning, laundry, and community activity  PERSONAL FACTORS: N/A are also affecting patient's functional outcome.   REHAB POTENTIAL: Good  CLINICAL DECISION MAKING: Stable/uncomplicated  EVALUATION COMPLEXITY: Low   GOALS: Goals reviewed  with patient? No  SHORT TERM GOALS: Target date: 05/17/24 Patient will be independent with performance of HEP to demonstrate adequate self management of symptoms.  Baseline:  Goal status: INITIAL  2.   Patient will report at least a 25% improvement with function and/or pain reduction overall since beginning PT. Baseline:  Goal status: INITIAL   LONG TERM GOALS: Target date: 06/07/24 Patient will improve LEFS score by 9 points to demonstrate improved perceived function while meeting MCID.  Baseline: Goal status: INITIAL 2.  Patient will improve  ankle DF  ROM bilaterally to at least 10 degrees to demonstrate improved LE mobility needed for functional transfers and gait mechanics.  Baseline:  Goal status: INITIAL 3.  Patient will perform TUG in 12 seconds or less to demonstrate decreased risk of falls.  Baseline:  Goal status: INITIAL   4.  Patient will improve 30 second chair stand test by at least 2 STS in order to demonstrate improved LE strength/power needed for community ambulation.  Baseline:  Goal status: INITIAL  5. Patient will report daily pain at 4/10 or less with conclusion of her daily activities to demonstrate Improved activity tolerance and pain reduction.  Baseline:  Goal status: INITIAL   PLAN:  PT FREQUENCY: 2x/week  PT DURATION: 6 weeks  PLANNED INTERVENTIONS: [id number]- PT Re-evaluation, 97110-Therapeutic exercises, 97530- Therapeutic activity, [id number]- Neuromuscular re-education, 97535- Self Care, [id number]- Manual therapy, [id number]- Gait training, [phone]- Electrical stimulation (manual), [id number]- Ultrasound, [id number]- Traction (mechanical), [phone] (1-2 muscles), [id number] (3+ muscles)- Dry Needling, Patient/Family education, Balance training, Stair training, Taping, Joint mobilization, Spinal mobilization, Cryotherapy, and Moist heat  PLAN FOR NEXT SESSION: F/U on appropriate shoes with good arch support PRN.  Prog ROM and strengthening activities.   Augustin Mclean, LPTA/CLT;  CBIS [phone]  4:57 PM, 05/17/24

## 2024-05-22 ENCOUNTER — Ambulatory Visit (HOSPITAL_COMMUNITY)

## 2024-05-25 ENCOUNTER — Ambulatory Visit (HOSPITAL_COMMUNITY)

## 2024-05-25 ENCOUNTER — Encounter (HOSPITAL_COMMUNITY): Payer: Self-pay

## 2024-05-25 DIAGNOSIS — Z7409 Other reduced mobility: Secondary | ICD-10-CM

## 2024-05-25 DIAGNOSIS — M79671 Pain in right foot: Secondary | ICD-10-CM | POA: Diagnosis not present

## 2024-05-25 DIAGNOSIS — M25671 Stiffness of right ankle, not elsewhere classified: Secondary | ICD-10-CM

## 2024-05-25 NOTE — Therapy (Signed)
 OUTPATIENT PHYSICAL THERAPY LOWER EXTREMITY TREATMENT   Patient Name: Peggy French MRN: [id number] DOB:08-12-1940, 83 y.o., female Today's Date: 05/25/2024  END OF SESSION:  PT End of Session - 05/25/24 1456     Visit Number 5    Number of Visits 13    Date for Recertification  05/24/24    Authorization Type BCBS Medicare    Authorization Time Period [employer] approved 13 visits from 04/26/2024-10/22/2024    Authorization - Visit Number 5    Authorization - Number of Visits 13    Progress Note Due on Visit 10    PT Start Time 1500    PT Stop Time 1541    PT Time Calculation (min) 41 min    Activity Tolerance Patient tolerated treatment well    Behavior During Therapy [facility] for tasks assessed/performed            Past Medical History:  Diagnosis Date   Asthmatic bronchitis 2011   Colon carcinoma metastatic to 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   a. s/p right hemicolectomy/chemo 2013 with mets to the lung s/p LLL wedge resection 2014.   Diabetes mellitus    x over 10 yrs   GERD (gastroesophageal reflux disease)    Gout    H/O hiatal hernia    History of colon cancer    History of shingles    Hyperlipidemia    Hypertension    EKG 10/12 EPIC, chest- 1 view  6/13 EPIC    Last 2D Echo on 05/02/2012 showed EF of greater than 55%   Microcytic anemia    Mild CAD    a. cath by Dr. Mona in 03/2011 - 20-30% ostial bifurcation stenosis of LAD, otherwise only mild luminal irregularities in LAD/RCA, LVEF 60%, no RWMA.   c. 06/18/16 LHC after abnormal nuc showed mild non obst CAD   Neuropathy    Obesity    Past Surgical History:  Procedure Laterality Date   ABDOMINAL HYSTERECTOMY     APPENDECTOMY     CARDIAC CATHETERIZATION  2001,2012   CARDIAC CATHETERIZATION N/A 06/18/2016   Procedure: Left Heart Cath and Coronary Angiography;  Surgeon: Lonni Hanson, MD;  Location: [facility] INVASIVE CV LAB;  Service: Cardiovascular;  Laterality: N/A;   CATARACT EXTRACTION W/PHACO Left 02/17/2015   Procedure:  CATARACT EXTRACTION PHACO AND INTRAOCULAR LENS PLACEMENT (IOC);  Surgeon: Cherene Mania, MD;  Location: [facility];  Service: Ophthalmology;  Laterality: Left;  CDE:8.01    CATARACT EXTRACTION W/PHACO Right 08/03/2021   Procedure: CATARACT EXTRACTION PHACO AND INTRAOCULAR LENS PLACEMENT (IOC);  Surgeon: Harrie Agent, MD;  Location: [facility];  Service: Ophthalmology;  Laterality: Right;  CDE: 13.97   CHOLECYSTECTOMY     COLON SURGERY     PARTIAL COLECTOMY 30 YRS / AGAIN 2013   COLONOSCOPY  11/26/2011   Procedure: COLONOSCOPY;  Surgeon: Claudis RAYMOND Rivet, MD;  Location: [facility];  Service: Endoscopy;  Laterality: N/A;  2:15   COLONOSCOPY N/A 12/08/2012   Procedure: COLONOSCOPY;  Surgeon: Claudis RAYMOND Rivet, MD;  Location: [facility];  Service: Endoscopy;  Laterality: N/A;  815-moved to 0950 Ann to notify pt   COLONOSCOPY N/A 03/25/2016   Procedure: COLONOSCOPY;  Surgeon: Claudis RAYMOND Rivet, MD;  Location: [facility];  Service: Endoscopy;  Laterality: N/A;  1200   COLONOSCOPY WITH PROPOFOL  N/A 07/29/2021   Procedure: COLONOSCOPY WITH PROPOFOL ;  Surgeon: Rivet Claudis RAYMOND, MD;  Location: [facility];  Service: Endoscopy;  Laterality: N/A;  11:10   ESOPHAGOGASTRODUODENOSCOPY  06/16/2011   Procedure: ESOPHAGOGASTRODUODENOSCOPY (EGD);  Surgeon: Claudis RAYMOND Rivet, MD;  Location: [facility];  Service: Endoscopy;  Laterality: N/A;  2:15   POLYPECTOMY  07/29/2021   Procedure: POLYPECTOMY;  Surgeon: Rivet Claudis RAYMOND, MD;  Location: [facility];  Service: Endoscopy;;  transverse colon   PORT-A-CATH REMOVAL N/A 06/04/2014   Procedure: REMOVAL PORT-A-CATH;  Surgeon: Donnice KATHEE Lunger, MD;  Location: [facility];  Service: General;  Laterality: N/A;   PORTACATH PLACEMENT  02/22/2012   Procedure: INSERTION PORT-A-CATH;  Surgeon: Donnice KATHEE Lunger, MD;  Location: [facility];  Service: General;  Laterality: N/A;  left subclavian   VIDEO ASSISTED THORACOSCOPY (VATS)/WEDGE RESECTION Left 03/27/2013   Procedure: VIDEO ASSISTED  THORACOSCOPY (VATS)/WEDGE RESECTION;  Surgeon: Dallas KATHEE Jude, MD;  Location: [facility];  Service: Thoracic;  Laterality: Left;   VIDEO BRONCHOSCOPY N/A 03/27/2013   Procedure: VIDEO BRONCHOSCOPY;  Surgeon: [place] KATHEE Jude, MD;  Location: [facility] OR;  Service: Thoracic;  Laterality: N/A;   Patient Active Problem List   Diagnosis Date Noted   PAD (peripheral artery disease) 11/15/2023   GERD (gastroesophageal reflux disease) 09/29/2023   Pain due to onychomycosis of toenails of both feet 04/28/2022   Chest pain 09/29/2020   Moderate risk chest pain 06/24/2016   Mixed hyperlipidemia 06/24/2016   Unstable angina (HCC) 06/19/2016   Mild CAD    Chest pain with moderate risk of acute coronary syndrome 06/17/2015   Type 2 diabetes mellitus with obesity 06/17/2015   Obesity (BMI 30-39.9)    Colon carcinoma metastatic to 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   S/P right colectomy 01/07/2012   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   Eval: Pt reports pain has been going on a while. Reports they told her she had bone spurs on both sides. Reports her ankle pain flares up every now and then. Reports she feels burning in calves sometimes.  PERTINENT HISTORY: 83 y.o. female presents with the above complaint. History confirmed with patient.  Her diabetes is well-controlled nails are thickened elongated and causing discomfort.  Previous debridement was helpful.  Blood sugar remains  well-controlled.  Her Achilles tendon pain has returned and is worse in the back of both heels.  Physical Exam: warm, good capillary refill, no trophic changes or ulcerative lesions, normal DP and PT pulses, and abnormal sensory exam.  Pain to palpation of the insertion of the posterior Achilles bilateral Left Foot: dystrophic yellowed discolored nail plates with subungual debris Right Foot: dystrophic yellowed discolored nail plates with subungual debris.   Pt report history of vertigo DM Gout Neuropathy Cardiac issues  PAIN:  Are you having pain? Yes: NPRS scale: 6/10 Pain location: back of heels, gastrocs Pain description: Burning pain  Aggravating factors: Sitting too long Relieving factors: raising feet on bed   PRECAUTIONS: None  RED FLAGS: None Pt reports neuropathy in feet bilaterally due to diabetes, and cramping in hands bilaterally, B/B issues due to previous colon cancer  WEIGHT BEARING RESTRICTIONS: No  FALLS:  Has patient fallen in last 6 months? No, reports 2 near misses just today  LIVING ENVIRONMENT: Lives with: lives with their son, caregiver for son who has dementia Lives in: House/apartment Stairs: No has ramp, one step into laundry area. Has following equipment at home: Single point cane  OCCUPATION: N/A  PLOF: Independent but gets Meals on Wheels  PATIENT GOALS:  To get back to where I can help myself and help my son, being more active, go to church   NEXT MD VISIT: January 2026  OBJECTIVE:  Note: Objective measures were completed at Evaluation unless otherwise noted.  DIAGNOSTIC FINDINGS: N/A  PATIENT SURVEYS:  LEFS  Extreme difficulty/unable (0), Quite a bit of difficulty (1), Moderate difficulty (2), Little difficulty (3), No difficulty (4) Survey date:    Any of your usual work, housework or school activities   2. Usual hobbies, recreational or sporting activities   3. Getting into/out of the bath   4. Walking between rooms   5.  Putting on socks/shoes   6. Squatting    7. Lifting an object, like a bag of groceries from the floor   8. Performing light activities around your home   9. Performing heavy activities around your home   10. Getting into/out of a car   11. Walking 2 blocks   12. Walking 1 mile   13. Going up/down 10 stairs (1 flight)   14. Standing for 1 hour   15.  sitting for 1 hour   16. Running on even ground   17. Running on uneven ground   18. Making sharp turns while running fast   19. Hopping    20. Rolling over in bed   Score total:  Lower Extremity Functional Score: 26 / 80 = 32.5 %      COGNITION: Overall cognitive status: Within functional limits for tasks assessed     SENSATION: WFL  POSTURE: rounded shoulders and forward head   LOWER EXTREMITY ROM:  Active ROM Right eval Left eval  Hip flexion    Hip extension    Hip abduction    Hip adduction    Hip internal rotation    Hip external rotation    Knee flexion    Knee extension    Ankle dorsiflexion 3 7  Ankle plantarflexion 40 42  Ankle inversion    Ankle eversion     (Blank rows = not tested)  LOWER EXTREMITY MMT:  MMT Right eval Left eval  Hip flexion    Hip extension    Hip abduction    Hip adduction    Hip internal rotation    Hip external rotation    Knee flexion    Knee extension 4 * burning pain 4 * burning pain   Ankle dorsiflexion 5 5  Ankle plantarflexion    Ankle inversion    Ankle eversion     (Blank rows = not tested)  LOWER EXTREMITY SPECIAL TESTS:    FUNCTIONAL TESTS:  30 seconds chair stand test Timed up and go (TUG): Next session 2 minute walk test: next session  30 second chair stand: 10 STS, slight burning in heels GAIT: Distance walked: 100 ft in session  Assistive device utilized: Single point cane Level of assistance: Modified independence Comments: SPC in R hand, dec velocity, WBOS, dec hip flex/ext bilat, dec stance time on LLE, dec IC/DF and push off/PF moments  bilat  TREATMENT DATE:                                    05/25/24 EXERCISE LOG  Exercise Repetitions and Resistance Comments  Nustep  L3 x 5 minutes    Rocker board  2 minutes each BUE support from parallel bars; AP and lateral    Standing gastroc stretch   4 x 30 seconds    Tandem stance  3 x 30 seconds each  Intermittent UE support   Sit to stand  15 reps  Without UE support   Resisted pull down  RTB x 13 reps  With static stance on foam   Seated HS curl   RTB x 15 reps each     Blank cell = exercise not performed today   05/17/24 Standing: Heel raises 10x Toe raises 10x  Tandem stance 2x 30 SLS Rt 7, Lt 2 Sidestep 2RT with QC  Slant board 3x 30  Discussed supportive shoes with recommendations including higher heel to toe drop, good shock absorportion, stability to assist with controlled movements side to side; recommended brands and stores to shop at  Seated BAPS L3 10 Df/Pf then Inv/Ev with therapist controlling knee   11/05: Vitals: BP Lt UE in seated position: 102/69 mmHg, HR 84 bpm DGI 1. Gait level surface (2) Mild Impairment: Walks 20', uses assistive devices, slower speed, mild gait deviations. 2. Change in gait speed (2) Mild Impairment: Is able to change speed but demonstrates mild gait deviations, or not gait deviations but unable to achieve a significant change in velocity, or uses an assistive device. 3. Gait with horizontal head turns (1) Moderate Impairment: Performs head turns with moderate change in gait velocity, slows down, staggers but recovers, can continue to walk. 4. Gait with vertical head turns 1) Moderate Impairment: Performs head turns with moderate change in gait velocity, slows down, staggers but recovers, can continue to walk. 5. Gait and pivot turn (1) Moderate Impairment: Turns slowly, requires verbal cueing,  requires several small steps to catch balance following turn and stop. 6. Step over obstacle (1) Moderate Impairment: Is able to step over box but must stop, then step over. May require verbal cueing. 7. Step around obstacles (1) Moderate Impairment: Is able to clear cones but must significantly slow, speed to accomplish task, or requires verbal cueing. 8. Stairs (1) Moderate Impairment: Two feet to a stair, must use rail.  TOTAL SCORE: 10 / 24  Heel raises incline slope 10x 3 2 sets Toe raises decline slope 10x3 Tandem stance 2x 30 Slant board 2x 30                                  05/01/24 EXERCISE LOG  Exercise Repetitions and Resistance Comments  Nustep  L3 x 5 minutes   Seated heel/toe raises   2.5 minutes  Alternating heel and toe raise   LAQ  2# x 2.5 minutes  Alternating LE   Sit to stand   10 reps  With UE support from armrests   Standing gastroc stretch  2 minutes    TUG  25.99 seconds w/ quad cane    2 MWT  217 feet w/ quad cane    Blank cell = exercise not performed today   04/26/24: PT Eval and HEP    PATIENT EDUCATION:  Education details:  HEP  Person educated: Patient Education method: Medical Illustrator Education comprehension: verbalized understanding and returned demonstration  HOME EXERCISE PROGRAM: Access Code: [email or web address] URL: [email or web address] Date: 05/01/2024 Prepared by: Lacinda Fass  Exercises - Sit to Stand  - 1 x daily - 7 x weekly - 3 sets - 10 reps - Seated Heel Toe Raises  - 1 x daily - 7 x weekly - 3 sets - 10 reps - Seated Gastroc Stretch with Strap  - 1 x daily - 7 x weekly - 3-4 sets - 30 seconds hold  Access Code: [email or web address] URL: [email or web address] Date: 04/26/2024 Prepared by: Rosaria Powell-Butler  Exercises - Heel Raises with Counter Support  - 2 x daily - 7 x weekly - 2 sets - 10 reps - Heel Toe Raises with Counter Support  - 2 x daily - 7 x weekly - 2 sets - 10 reps - Seated  Dorsiflexion Stretch  - 2 x daily - 7 x weekly - 2 sets - 10 reps - 5 hold  ASSESSMENT:  CLINICAL IMPRESSION:  Patient was introduced to multiple new and familiar interventions for improved lower extremity stability. She required minimal cueing with resisted pull downs to maintain upright stance to facilitate lower extremity stability with upper extremity mobility. She reported feeling alright upon the conclusion of treatment  Patient continues to require skilled physical therapy to address her remaining impairments to return to her prior level of function.    Eval: Patient is a 83 y.o. female who was seen today for physical therapy evaluation and treatment for  M76.61,M76.62 (ICD-10-CM) - Achilles tendinitis of both lower extremities  . On this date, patient demonstrates impaired self perception of function, decreased ankle ROM, decreased strength and decreased endurance, all of which may be contributing to patients altered gait pattern, increased pain, and difficulty with functional transfers. Educated patient on proper shoe wear as she is wearing very flat shoes. Explained how shoes with better arch support may help to limit her discomfort in her heels. Plan to give p print off examples in next sessions. Time limited during evaluation due to late arrival. Patient will benefit from skilled physical therapy in order to address current deficits to improve gait mechanics, balance, pain, and overall function to improved quality of life.   OBJECTIVE IMPAIRMENTS: Abnormal gait, cardiopulmonary status limiting activity, decreased activity tolerance, decreased balance, decreased endurance, decreased mobility, difficulty walking, decreased ROM, decreased strength, dizziness, impaired flexibility, improper body mechanics, postural dysfunction, and pain.   ACTIVITY LIMITATIONS: lifting, bending, sitting, standing, squatting, stairs, and transfers  PARTICIPATION LIMITATIONS: meal prep, cleaning, laundry, and  community activity  PERSONAL FACTORS: N/A are also affecting patient's functional outcome.   REHAB POTENTIAL: Good  CLINICAL DECISION MAKING: Stable/uncomplicated  EVALUATION COMPLEXITY: Low   GOALS: Goals reviewed with patient? No  SHORT TERM GOALS: Target date: 05/17/24 Patient will be independent with performance of HEP to demonstrate adequate self management of symptoms.  Baseline:  Goal status: INITIAL  2.   Patient will report at least a 25% improvement with function and/or pain reduction overall since beginning PT. Baseline:  Goal status: INITIAL   LONG TERM GOALS: Target date: 06/07/24 Patient will improve LEFS score by 9 points to demonstrate improved perceived function while meeting MCID.  Baseline: Goal status: INITIAL 2.  Patient will improve  ankle DF  ROM bilaterally to at least 10 degrees to demonstrate improved LE mobility needed for functional transfers and gait mechanics.  Baseline:  Goal status: INITIAL 3.  Patient will  perform TUG in 12 seconds or less to demonstrate decreased risk of falls.  Baseline:  Goal status: INITIAL   4.  Patient will improve 30 second chair stand test by at least 2 STS in order to demonstrate improved LE strength/power needed for community ambulation.  Baseline:  Goal status: INITIAL  5. Patient will report daily pain at 4/10 or less with conclusion of her daily activities to demonstrate Improved activity tolerance and pain reduction.  Baseline:  Goal status: INITIAL   PLAN:  PT FREQUENCY: 2x/week  PT DURATION: 6 weeks  PLANNED INTERVENTIONS: [id number]- PT Re-evaluation, 97110-Therapeutic exercises, 97530- Therapeutic activity, [id number]- Neuromuscular re-education, 97535- Self Care, [id number]- Manual therapy, [id number]- Gait training, [phone]- Electrical stimulation (manual), [id number]- Ultrasound, [id number]- Traction (mechanical), [phone] (1-2 muscles), [id number] (3+ muscles)- Dry Needling, Patient/Family education, Balance training, Stair training,  Taping, Joint mobilization, Spinal mobilization, Cryotherapy, and Moist heat  PLAN FOR NEXT SESSION: F/U on appropriate shoes with good arch support PRN.  Prog ROM and strengthening activities.   Lacinda Fass, PT, DPT  5:12 PM, 05/25/24

## 2024-05-29 ENCOUNTER — Encounter (HOSPITAL_COMMUNITY): Payer: Self-pay

## 2024-05-29 ENCOUNTER — Ambulatory Visit (HOSPITAL_COMMUNITY)

## 2024-05-29 DIAGNOSIS — M25671 Stiffness of right ankle, not elsewhere classified: Secondary | ICD-10-CM

## 2024-05-29 DIAGNOSIS — M79671 Pain in right foot: Secondary | ICD-10-CM | POA: Diagnosis not present

## 2024-05-29 DIAGNOSIS — Z7409 Other reduced mobility: Secondary | ICD-10-CM

## 2024-05-29 NOTE — Therapy (Signed)
 OUTPATIENT PHYSICAL THERAPY LOWER EXTREMITY TREATMENT   Patient Name: Peggy French MRN: [id number] DOB:11/23/1940, 83 y.o., female Today's Date: 05/29/2024  END OF SESSION:  PT End of Session - 05/29/24 1631     Visit Number 6    Number of Visits 13    Date for Recertification  05/24/24    Authorization Type BCBS Medicare    Authorization Time Period [employer] approved 13 visits from 04/26/2024-10/22/2024    Authorization - Visit Number 6    Authorization - Number of Visits 13    Progress Note Due on Visit 10    PT Start Time 1631    PT Stop Time 1711    PT Time Calculation (min) 40 min    Activity Tolerance Patient tolerated treatment well    Behavior During Therapy [facility] for tasks assessed/performed             Past Medical History:  Diagnosis Date   Asthmatic bronchitis 2011   Colon carcinoma metastatic to 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   a. s/p right hemicolectomy/chemo 2013 with mets to the lung s/p LLL wedge resection 2014.   Diabetes mellitus    x over 10 yrs   GERD (gastroesophageal reflux disease)    Gout    H/O hiatal hernia    History of colon cancer    History of shingles    Hyperlipidemia    Hypertension    EKG 10/12 EPIC, chest- 1 view  6/13 EPIC    Last 2D Echo on 05/02/2012 showed EF of greater than 55%   Microcytic anemia    Mild CAD    a. cath by Dr. Mona in 03/2011 - 20-30% ostial bifurcation stenosis of LAD, otherwise only mild luminal irregularities in LAD/RCA, LVEF 60%, no RWMA.   c. 06/18/16 LHC after abnormal nuc showed mild non obst CAD   Neuropathy    Obesity    Past Surgical History:  Procedure Laterality Date   ABDOMINAL HYSTERECTOMY     APPENDECTOMY     CARDIAC CATHETERIZATION  2001,2012   CARDIAC CATHETERIZATION N/A 06/18/2016   Procedure: Left Heart Cath and Coronary Angiography;  Surgeon: Lonni Hanson, MD;  Location: [facility] INVASIVE CV LAB;  Service: Cardiovascular;  Laterality: N/A;   CATARACT EXTRACTION W/PHACO Left 02/17/2015   Procedure:  CATARACT EXTRACTION PHACO AND INTRAOCULAR LENS PLACEMENT (IOC);  Surgeon: Cherene Mania, MD;  Location: [facility];  Service: Ophthalmology;  Laterality: Left;  CDE:8.01    CATARACT EXTRACTION W/PHACO Right 08/03/2021   Procedure: CATARACT EXTRACTION PHACO AND INTRAOCULAR LENS PLACEMENT (IOC);  Surgeon: Harrie Agent, MD;  Location: [facility];  Service: Ophthalmology;  Laterality: Right;  CDE: 13.97   CHOLECYSTECTOMY     COLON SURGERY     PARTIAL COLECTOMY 30 YRS / AGAIN 2013   COLONOSCOPY  11/26/2011   Procedure: COLONOSCOPY;  Surgeon: Claudis RAYMOND Rivet, MD;  Location: [facility];  Service: Endoscopy;  Laterality: N/A;  2:15   COLONOSCOPY N/A 12/08/2012   Procedure: COLONOSCOPY;  Surgeon: Claudis RAYMOND Rivet, MD;  Location: [facility];  Service: Endoscopy;  Laterality: N/A;  815-moved to 0950 Ann to notify pt   COLONOSCOPY N/A 03/25/2016   Procedure: COLONOSCOPY;  Surgeon: Claudis RAYMOND Rivet, MD;  Location: [facility];  Service: Endoscopy;  Laterality: N/A;  1200   COLONOSCOPY WITH PROPOFOL  N/A 07/29/2021   Procedure: COLONOSCOPY WITH PROPOFOL ;  Surgeon: Rivet Claudis RAYMOND, MD;  Location: [facility];  Service: Endoscopy;  Laterality: N/A;  11:10   ESOPHAGOGASTRODUODENOSCOPY  06/16/2011   Procedure: ESOPHAGOGASTRODUODENOSCOPY (EGD);  Surgeon: Claudis RAYMOND Rivet, MD;  Location: [facility];  Service: Endoscopy;  Laterality: N/A;  2:15   POLYPECTOMY  07/29/2021   Procedure: POLYPECTOMY;  Surgeon: Rivet Claudis RAYMOND, MD;  Location: [facility];  Service: Endoscopy;;  transverse colon   PORT-A-CATH REMOVAL N/A 06/04/2014   Procedure: REMOVAL PORT-A-CATH;  Surgeon: Donnice KATHEE Lunger, MD;  Location: [facility];  Service: General;  Laterality: N/A;   PORTACATH PLACEMENT  02/22/2012   Procedure: INSERTION PORT-A-CATH;  Surgeon: Donnice KATHEE Lunger, MD;  Location: [facility];  Service: General;  Laterality: N/A;  left subclavian   VIDEO ASSISTED THORACOSCOPY (VATS)/WEDGE RESECTION Left 03/27/2013   Procedure: VIDEO ASSISTED  THORACOSCOPY (VATS)/WEDGE RESECTION;  Surgeon: Dallas KATHEE Jude, MD;  Location: [facility] OR;  Service: Thoracic;  Laterality: Left;   VIDEO BRONCHOSCOPY N/A 03/27/2013   Procedure: VIDEO BRONCHOSCOPY;  Surgeon: Dallas KATHEE Jude, MD;  Location: [facility] OR;  Service: Thoracic;  Laterality: N/A;   Patient Active Problem List   Diagnosis Date Noted   PAD (peripheral artery disease) 11/15/2023   GERD (gastroesophageal reflux disease) 09/29/2023   Pain due to onychomycosis of toenails of both feet 04/28/2022   Chest pain 09/29/2020   Moderate risk chest pain 06/24/2016   Mixed hyperlipidemia 06/24/2016   Unstable angina (HCC) 06/19/2016   Mild CAD    Chest pain with moderate risk of acute coronary syndrome 06/17/2015   Type 2 diabetes mellitus with obesity 06/17/2015   Obesity (BMI 30-39.9)    Colon carcinoma metastatic to 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   S/P right colectomy 01/07/2012   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 She was a little sore in her calf and heel after her last appointment.   Eval: Pt reports pain has been going on a while. Reports they told her she had bone spurs on both sides. Reports her ankle pain flares up every now and then. Reports she feels burning in calves sometimes.  PERTINENT HISTORY: 83 y.o. female presents with the above complaint. History confirmed with patient.  Her diabetes is well-controlled nails are thickened elongated and causing  discomfort.  Previous debridement was helpful.  Blood sugar remains well-controlled.  Her Achilles tendon pain has returned and is worse in the back of both heels.  Physical Exam: warm, good capillary refill, no trophic changes or ulcerative lesions, normal DP and PT pulses, and abnormal sensory exam.  Pain to palpation of the insertion of the posterior Achilles bilateral Left Foot: dystrophic yellowed discolored nail plates with subungual debris Right Foot: dystrophic yellowed discolored nail plates with subungual debris.   Pt report history of vertigo DM Gout Neuropathy Cardiac issues  PAIN:  Are you having pain? Yes: NPRS scale: 5-6/10 Pain location: back of heels, gastrocs Pain description: Burning pain  Aggravating factors: Sitting too long Relieving factors: raising feet on bed   PRECAUTIONS: None  RED FLAGS: None Pt reports neuropathy in feet bilaterally due to diabetes, and  cramping in hands bilaterally, B/B issues due to previous colon cancer  WEIGHT BEARING RESTRICTIONS: No  FALLS:  Has patient fallen in last 6 months? No, reports 2 near misses just today  LIVING ENVIRONMENT: Lives with: lives with their son, caregiver for son who has dementia Lives in: House/apartment Stairs: No has ramp, one step into laundry area. Has following equipment at home: Single point cane  OCCUPATION: N/A  PLOF: Independent but gets Meals on Wheels  PATIENT GOALS:  To get back to where I can help myself and help my son, being more active, go to church   NEXT MD VISIT: January 2026  OBJECTIVE:  Note: Objective measures were completed at Evaluation unless otherwise noted.  DIAGNOSTIC FINDINGS: N/A  PATIENT SURVEYS:  LEFS  Extreme difficulty/unable (0), Quite a bit of difficulty (1), Moderate difficulty (2), Little difficulty (3), No difficulty (4) Survey date:    Any of your usual work, housework or school activities   2. Usual hobbies, recreational or sporting activities    3. Getting into/out of the bath   4. Walking between rooms   5. Putting on socks/shoes   6. Squatting    7. Lifting an object, like a bag of groceries from the floor   8. Performing light activities around your home   9. Performing heavy activities around your home   10. Getting into/out of a car   11. Walking 2 blocks   12. Walking 1 mile   13. Going up/down 10 stairs (1 flight)   14. Standing for 1 hour   15.  sitting for 1 hour   16. Running on even ground   17. Running on uneven ground   18. Making sharp turns while running fast   19. Hopping    20. Rolling over in bed   Score total:  Lower Extremity Functional Score: 26 / 80 = 32.5 %      COGNITION: Overall cognitive status: Within functional limits for tasks assessed     SENSATION: WFL  POSTURE: rounded shoulders and forward head   LOWER EXTREMITY ROM:  Active ROM Right eval Left eval  Hip flexion    Hip extension    Hip abduction    Hip adduction    Hip internal rotation    Hip external rotation    Knee flexion    Knee extension    Ankle dorsiflexion 3 7  Ankle plantarflexion 40 42  Ankle inversion    Ankle eversion     (Blank rows = not tested)  LOWER EXTREMITY MMT:  MMT Right eval Left eval  Hip flexion    Hip extension    Hip abduction    Hip adduction    Hip internal rotation    Hip external rotation    Knee flexion    Knee extension 4 * burning pain 4 * burning pain   Ankle dorsiflexion 5 5  Ankle plantarflexion    Ankle inversion    Ankle eversion     (Blank rows = not tested)  LOWER EXTREMITY SPECIAL TESTS:    FUNCTIONAL TESTS:  30 seconds chair stand test Timed up and go (TUG): Next session 2 minute walk test: next session  30 second chair stand: 10 STS, slight burning in heels GAIT: Distance walked: 100 ft in session  Assistive device utilized: Single point cane Level of assistance: Modified independence Comments: SPC in R hand, dec velocity, WBOS, dec hip flex/ext  bilat, dec stance time on LLE, dec IC/DF and  push off/PF moments bilat                                                                                                                                TREATMENT DATE:                                    05/29/24 EXERCISE LOG  Exercise Repetitions and Resistance Comments  Nustep  L3 x 7 minutes    Seated HS curl  GTB x 20 reps each    Toe taps on step   8 step x 15 reps each  BUE support from parallel bars   Static stance on foam  2 minutes  Increased sway   Standing gastroc stretch  4 x 30 seconds    Standing heel raise   30 reps  BUE support from parallel bars   Sit to stand   15 reps  Without UE support    Blank cell = exercise not performed today                                    05/25/24 EXERCISE LOG  Exercise Repetitions and Resistance Comments  Nustep  L3 x 5 minutes    Rocker board  2 minutes each BUE support from parallel bars; AP and lateral    Standing gastroc stretch   4 x 30 seconds    Tandem stance  3 x 30 seconds each  Intermittent UE support   Sit to stand  15 reps  Without UE support   Resisted pull down  RTB x 13 reps  With static stance on foam   Seated HS curl   RTB x 15 reps each     Blank cell = exercise not performed today   05/17/24 Standing: Heel raises 10x Toe raises 10x  Tandem stance 2x 30 SLS Rt 7, Lt 2 Sidestep 2RT with QC  Slant board 3x 30  Discussed supportive shoes with recommendations including higher heel to toe drop, good shock absorportion, stability to assist with controlled movements side to side; recommended brands and stores to shop at  Seated BAPS L3 10 Df/Pf then Inv/Ev with therapist controlling knee   11/05: Vitals: BP Lt UE in seated position: 102/69 mmHg, HR 84 bpm DGI 1. Gait level surface (2) Mild Impairment: Walks 20', uses assistive devices, slower speed, mild gait deviations. 2. Change in gait speed (2) Mild Impairment: Is able to change speed but demonstrates  mild gait deviations, or not gait deviations but unable to achieve a significant change in velocity, or uses an assistive device. 3. Gait with horizontal head turns (1) Moderate Impairment: Performs head turns with moderate change in gait velocity, slows down, staggers but recovers, can continue to walk. 4. Gait with vertical head turns 1)  Moderate Impairment: Performs head turns with moderate change in gait velocity, slows down, staggers but recovers, can continue to walk. 5. Gait and pivot turn (1) Moderate Impairment: Turns slowly, requires verbal cueing, requires several small steps to catch balance following turn and stop. 6. Step over obstacle (1) Moderate Impairment: Is able to step over box but must stop, then step over. May require verbal cueing. 7. Step around obstacles (1) Moderate Impairment: Is able to clear cones but must significantly slow, speed to accomplish task, or requires verbal cueing. 8. Stairs (1) Moderate Impairment: Two feet to a stair, must use rail.  TOTAL SCORE: 10 / 24  Heel raises incline slope 10x 3 2 sets Toe raises decline slope 10x3 Tandem stance 2x 30 Slant board 2x 30                                  05/01/24 EXERCISE LOG  Exercise Repetitions and Resistance Comments  Nustep  L3 x 5 minutes   Seated heel/toe raises   2.5 minutes  Alternating heel and toe raise   LAQ  2# x 2.5 minutes  Alternating LE   Sit to stand   10 reps  With UE support from armrests   Standing gastroc stretch  2 minutes    TUG  25.99 seconds w/ quad cane    2 MWT  217 feet w/ quad cane    Blank cell = exercise not performed today   04/26/24: PT Eval and HEP    PATIENT EDUCATION:  Education details: HEP and plan for next appointment Person educated: Patient Education method: Explanation Education comprehension: verbalized understanding  HOME EXERCISE PROGRAM: Access Code: [email or web address] URL: [email or web address] Date: 05/01/2024 Prepared by: Lacinda Fass  Exercises - Sit to Stand  - 1 x daily - 7 x weekly - 3 sets - 10 reps - Seated Heel Toe Raises  - 1 x daily - 7 x weekly - 3 sets - 10 reps - Seated Gastroc Stretch with Strap  - 1 x daily - 7 x weekly - 3-4 sets - 30 seconds hold  Access Code: [email or web address] URL: [email or web address] Date: 04/26/2024 Prepared by: Rosaria Powell-Butler  Exercises - Heel Raises with Counter Support  - 2 x daily - 7 x weekly - 2 sets - 10 reps - Heel Toe Raises with Counter Support  - 2 x daily - 7 x weekly - 2 sets - 10 reps - Seated Dorsiflexion Stretch  - 2 x daily - 7 x weekly - 2 sets - 10 reps - 5 hold  ASSESSMENT:  CLINICAL IMPRESSION:  Patient was progressed with familiar interventions for improved ankle strength and stability. She required minimal cueing with the seated hamstring curl for improved eccentric control needed for her functional mobility. She experienced no increase in pain or discomfort with any of today's interventions. She reported feeling alright upon the conclusion of treatment. Patient continues to require skilled physical therapy to address her remaining impairments to return to her prior level of function.    Eval: Patient is a 83 y.o. female who was seen today for physical therapy evaluation and treatment for  M76.61,M76.62 (ICD-10-CM) - Achilles tendinitis of both lower extremities  . On this date, patient demonstrates impaired self perception of function, decreased ankle ROM, decreased strength and decreased endurance, all of which may be contributing to patients altered gait pattern, increased pain, and difficulty with functional transfers. Educated patient on  proper shoe wear as she is wearing very flat shoes. Explained how shoes with better arch support may help to limit her discomfort in her heels. Plan to give p print off examples in next sessions. Time limited during evaluation due to late arrival. Patient will benefit from skilled physical therapy in order to  address current deficits to improve gait mechanics, balance, pain, and overall function to improved quality of life.   OBJECTIVE IMPAIRMENTS: Abnormal gait, cardiopulmonary status limiting activity, decreased activity tolerance, decreased balance, decreased endurance, decreased mobility, difficulty walking, decreased ROM, decreased strength, dizziness, impaired flexibility, improper body mechanics, postural dysfunction, and pain.   ACTIVITY LIMITATIONS: lifting, bending, sitting, standing, squatting, stairs, and transfers  PARTICIPATION LIMITATIONS: meal prep, cleaning, laundry, and community activity  PERSONAL FACTORS: N/A are also affecting patient's functional outcome.   REHAB POTENTIAL: Good  CLINICAL DECISION MAKING: Stable/uncomplicated  EVALUATION COMPLEXITY: Low   GOALS: Goals reviewed with patient? No  SHORT TERM GOALS: Target date: 05/17/24 Patient will be independent with performance of HEP to demonstrate adequate self management of symptoms.  Baseline:  Goal status: INITIAL  2.   Patient will report at least a 25% improvement with function and/or pain reduction overall since beginning PT. Baseline:  Goal status: INITIAL   LONG TERM GOALS: Target date: 06/07/24 Patient will improve LEFS score by 9 points to demonstrate improved perceived function while meeting MCID.  Baseline: Goal status: INITIAL 2.  Patient will improve  ankle DF  ROM bilaterally to at least 10 degrees to demonstrate improved LE mobility needed for functional transfers and gait mechanics.  Baseline:  Goal status: INITIAL 3.  Patient will perform TUG in 12 seconds or less to demonstrate decreased risk of falls.  Baseline:  Goal status: INITIAL   4.  Patient will improve 30 second chair stand test by at least 2 STS in order to demonstrate improved LE strength/power needed for community ambulation.  Baseline:  Goal status: INITIAL  5. Patient will report daily pain at 4/10 or less with  conclusion of her daily activities to demonstrate Improved activity tolerance and pain reduction.  Baseline:  Goal status: INITIAL   PLAN:  PT FREQUENCY: 2x/week  PT DURATION: 6 weeks  PLANNED INTERVENTIONS: [id number]- PT Re-evaluation, 97110-Therapeutic exercises, 97530- Therapeutic activity, [id number]- Neuromuscular re-education, 97535- Self Care, [id number]- Manual therapy, [id number]- Gait training, [phone]- Electrical stimulation (manual), [id number]- Ultrasound, [id number]- Traction (mechanical), [phone] (1-2 muscles), 20561 (3+ muscles)- Dry Needling, Patient/Family education, Balance training, Stair training, Taping, Joint mobilization, Spinal mobilization, Cryotherapy, and Moist heat  PLAN FOR NEXT SESSION: F/U on appropriate shoes with good arch support PRN.  Prog ROM and strengthening activities.   Lacinda Fass, PT, DPT  5:23 PM, 05/29/24

## 2024-05-31 ENCOUNTER — Ambulatory Visit (HOSPITAL_COMMUNITY)

## 2024-06-05 ENCOUNTER — Ambulatory Visit (HOSPITAL_COMMUNITY)

## 2024-06-06 ENCOUNTER — Ambulatory Visit (HOSPITAL_COMMUNITY)

## 2024-06-11 ENCOUNTER — Encounter (HOSPITAL_COMMUNITY): Payer: Self-pay

## 2024-06-11 ENCOUNTER — Ambulatory Visit (HOSPITAL_COMMUNITY): Attending: Podiatry

## 2024-06-11 DIAGNOSIS — M79672 Pain in left foot: Secondary | ICD-10-CM | POA: Insufficient documentation

## 2024-06-11 DIAGNOSIS — M25671 Stiffness of right ankle, not elsewhere classified: Secondary | ICD-10-CM | POA: Insufficient documentation

## 2024-06-11 DIAGNOSIS — M79671 Pain in right foot: Secondary | ICD-10-CM | POA: Diagnosis present

## 2024-06-11 DIAGNOSIS — M25672 Stiffness of left ankle, not elsewhere classified: Secondary | ICD-10-CM | POA: Insufficient documentation

## 2024-06-11 DIAGNOSIS — Z7409 Other reduced mobility: Secondary | ICD-10-CM | POA: Insufficient documentation

## 2024-06-11 NOTE — Therapy (Signed)
 OUTPATIENT PHYSICAL THERAPY LOWER EXTREMITY TREATMENT   Patient Name: Peggy French MRN: [id number] DOB:10/31/1940, 83 y.o., female Today's Date: 06/11/2024  END OF SESSION:  PT End of Session - 06/11/24 1519     Visit Number 7    Number of Visits 13    Date for Recertification  05/24/24    Authorization Type BCBS Medicare    Authorization Time Period [employer] approved 13 visits from 04/26/2024-10/22/2024    Authorization - Visit Number 7    Authorization - Number of Visits 13    Progress Note Due on Visit 10    PT Start Time 1519   Patient arrived late to her appointment.   PT Stop Time 1545    PT Time Calculation (min) 26 min    Activity Tolerance Patient tolerated treatment well    Behavior During Therapy [facility] for tasks assessed/performed              Past Medical History:  Diagnosis Date   Asthmatic bronchitis 2011   Colon carcinoma metastatic to 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   a. s/p right hemicolectomy/chemo 2013 with mets to the lung s/p LLL wedge resection 2014.   Diabetes mellitus    x over 10 yrs   GERD (gastroesophageal reflux disease)    Gout    H/O hiatal hernia    History of colon cancer    History of shingles    Hyperlipidemia    Hypertension    EKG 10/12 EPIC, chest- 1 view  6/13 EPIC    Last 2D Echo on 05/02/2012 showed EF of greater than 55%   Microcytic anemia    Mild CAD    a. cath by Dr. Mona in 03/2011 - 20-30% ostial bifurcation stenosis of LAD, otherwise only mild luminal irregularities in LAD/RCA, LVEF 60%, no RWMA.   c. 06/18/16 LHC after abnormal nuc showed mild non obst CAD   Neuropathy    Obesity    Past Surgical History:  Procedure Laterality Date   ABDOMINAL HYSTERECTOMY     APPENDECTOMY     CARDIAC CATHETERIZATION  2001,2012   CARDIAC CATHETERIZATION N/A 06/18/2016   Procedure: Left Heart Cath and Coronary Angiography;  Surgeon: Lonni Hanson, MD;  Location: [facility] INVASIVE CV LAB;  Service: Cardiovascular;  Laterality: N/A;   CATARACT  EXTRACTION W/PHACO Left 02/17/2015   Procedure: CATARACT EXTRACTION PHACO AND INTRAOCULAR LENS PLACEMENT (IOC);  Surgeon: Cherene Mania, MD;  Location: [facility];  Service: Ophthalmology;  Laterality: Left;  CDE:8.01    CATARACT EXTRACTION W/PHACO Right 08/03/2021   Procedure: CATARACT EXTRACTION PHACO AND INTRAOCULAR LENS PLACEMENT (IOC);  Surgeon: Harrie Agent, MD;  Location: [facility];  Service: Ophthalmology;  Laterality: Right;  CDE: 13.97   CHOLECYSTECTOMY     COLON SURGERY     PARTIAL COLECTOMY 30 YRS / AGAIN 2013   COLONOSCOPY  11/26/2011   Procedure: COLONOSCOPY;  Surgeon: Claudis RAYMOND Rivet, MD;  Location: [facility];  Service: Endoscopy;  Laterality: N/A;  2:15   COLONOSCOPY N/A 12/08/2012   Procedure: COLONOSCOPY;  Surgeon: Claudis RAYMOND Rivet, MD;  Location: [facility];  Service: Endoscopy;  Laterality: N/A;  815-moved to 0950 Ann to notify pt   COLONOSCOPY N/A 03/25/2016   Procedure: COLONOSCOPY;  Surgeon: Claudis RAYMOND Rivet, MD;  Location: [facility];  Service: Endoscopy;  Laterality: N/A;  1200   COLONOSCOPY WITH PROPOFOL  N/A 07/29/2021   Procedure: COLONOSCOPY WITH PROPOFOL ;  Surgeon: Rivet Claudis RAYMOND, MD;  Location: [facility];  Service: Endoscopy;  Laterality: N/A;  11:10   ESOPHAGOGASTRODUODENOSCOPY  06/16/2011   Procedure: ESOPHAGOGASTRODUODENOSCOPY (EGD);  Surgeon: Claudis RAYMOND Rivet, MD;  Location: [facility];  Service: Endoscopy;  Laterality: N/A;  2:15   POLYPECTOMY  07/29/2021   Procedure: POLYPECTOMY;  Surgeon: Rivet Claudis RAYMOND, MD;  Location: [facility];  Service: Endoscopy;;  transverse colon   PORT-A-CATH REMOVAL N/A 06/04/2014   Procedure: REMOVAL PORT-A-CATH;  Surgeon: Donnice KATHEE Lunger, MD;  Location: [facility];  Service: General;  Laterality: N/A;   PORTACATH PLACEMENT  02/22/2012   Procedure: INSERTION PORT-A-CATH;  Surgeon: Donnice KATHEE Lunger, MD;  Location: [facility];  Service: General;  Laterality: N/A;  left subclavian   VIDEO ASSISTED THORACOSCOPY (VATS)/WEDGE RESECTION  Left 03/27/2013   Procedure: VIDEO ASSISTED THORACOSCOPY (VATS)/WEDGE RESECTION;  Surgeon: Dallas KATHEE Jude, MD;  Location: [facility] OR;  Service: Thoracic;  Laterality: Left;   VIDEO BRONCHOSCOPY N/A 03/27/2013   Procedure: VIDEO BRONCHOSCOPY;  Surgeon: Dallas KATHEE Jude, MD;  Location: [facility] OR;  Service: Thoracic;  Laterality: N/A;   Patient Active Problem List   Diagnosis Date Noted   PAD (peripheral artery disease) 11/15/2023   GERD (gastroesophageal reflux disease) 09/29/2023   Pain due to onychomycosis of toenails of both feet 04/28/2022   Chest pain 09/29/2020   Moderate risk chest pain 06/24/2016   Mixed hyperlipidemia 06/24/2016   Unstable angina (HCC) 06/19/2016   Mild CAD    Chest pain with moderate risk of acute coronary syndrome 06/17/2015   Type 2 diabetes mellitus with obesity 06/17/2015   Obesity (BMI 30-39.9)    Colon carcinoma metastatic to 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   S/P right colectomy 01/07/2012   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 She has been sick last week so she has not been able to come to therapy.   Eval: Pt reports pain has been going on a while. Reports they told her she had bone spurs on both sides. Reports her ankle pain flares up every now and then. Reports she feels burning in calves sometimes.  PERTINENT HISTORY: 83 y.o. female presents with the above complaint. History confirmed with patient.  Her diabetes is well-controlled  nails are thickened elongated and causing discomfort.  Previous debridement was helpful.  Blood sugar remains well-controlled.  Her Achilles tendon pain has returned and is worse in the back of both heels.  Physical Exam: warm, good capillary refill, no trophic changes or ulcerative lesions, normal DP and PT pulses, and abnormal sensory exam.  Pain to palpation of the insertion of the posterior Achilles bilateral Left Foot: dystrophic yellowed discolored nail plates with subungual debris Right Foot: dystrophic yellowed discolored nail plates with subungual debris.   Pt report history of vertigo DM Gout Neuropathy Cardiac issues  PAIN:  Are you having pain? Yes: NPRS scale: 5-6/10 Pain location: back of heels, gastrocs Pain description: Burning pain  Aggravating factors: Sitting too long Relieving factors: raising feet on bed   PRECAUTIONS: None  RED FLAGS:  None Pt reports neuropathy in feet bilaterally due to diabetes, and cramping in hands bilaterally, B/B issues due to previous colon cancer  WEIGHT BEARING RESTRICTIONS: No  FALLS:  Has patient fallen in last 6 months? No, reports 2 near misses just today  LIVING ENVIRONMENT: Lives with: lives with their son, caregiver for son who has dementia Lives in: House/apartment Stairs: No has ramp, one step into laundry area. Has following equipment at home: Single point cane  OCCUPATION: N/A  PLOF: Independent but gets Meals on Wheels  PATIENT GOALS:  To get back to where I can help myself and help my son, being more active, go to church   NEXT MD VISIT: January 2026  OBJECTIVE:  Note: Objective measures were completed at Evaluation unless otherwise noted.  DIAGNOSTIC FINDINGS: N/A  PATIENT SURVEYS:  LEFS  Extreme difficulty/unable (0), Quite a bit of difficulty (1), Moderate difficulty (2), Little difficulty (3), No difficulty (4) Survey date:    Any of your usual work, housework or school activities   2. Usual  hobbies, recreational or sporting activities   3. Getting into/out of the bath   4. Walking between rooms   5. Putting on socks/shoes   6. Squatting    7. Lifting an object, like a bag of groceries from the floor   8. Performing light activities around your home   9. Performing heavy activities around your home   10. Getting into/out of a car   11. Walking 2 blocks   12. Walking 1 mile   13. Going up/down 10 stairs (1 flight)   14. Standing for 1 hour   15.  sitting for 1 hour   16. Running on even ground   17. Running on uneven ground   18. Making sharp turns while running fast   19. Hopping    20. Rolling over in bed   Score total:  Lower Extremity Functional Score: 26 / 80 = 32.5 %      COGNITION: Overall cognitive status: Within functional limits for tasks assessed     SENSATION: WFL  POSTURE: rounded shoulders and forward head   LOWER EXTREMITY ROM:  Active ROM Right eval Left eval  Hip flexion    Hip extension    Hip abduction    Hip adduction    Hip internal rotation    Hip external rotation    Knee flexion    Knee extension    Ankle dorsiflexion 3 7  Ankle plantarflexion 40 42  Ankle inversion    Ankle eversion     (Blank rows = not tested)  LOWER EXTREMITY MMT:  MMT Right eval Left eval  Hip flexion    Hip extension    Hip abduction    Hip adduction    Hip internal rotation    Hip external rotation    Knee flexion    Knee extension 4 * burning pain 4 * burning pain   Ankle dorsiflexion 5 5  Ankle plantarflexion    Ankle inversion    Ankle eversion     (Blank rows = not tested)  LOWER EXTREMITY SPECIAL TESTS:    FUNCTIONAL TESTS:  30 seconds chair stand test Timed up and go (TUG): Next session 2 minute walk test: next session  30 second chair stand: 10 STS, slight burning in heels GAIT: Distance walked: 100 ft in session  Assistive device utilized: Single point cane Level of assistance: Modified independence Comments: SPC in R  hand, dec velocity, WBOS, dec  hip flex/ext bilat, dec stance time on LLE, dec IC/DF and push off/PF moments bilat                                                                                                                                TREATMENT DATE:                                    06/11/24 EXERCISE LOG  Exercise Repetitions and Resistance Comments  Nustep  L4 x 8 minutes    Goal assessment for progress report   See below                 Blank cell = exercise not performed today                                    05/29/24 EXERCISE LOG  Exercise Repetitions and Resistance Comments  Nustep  L3 x 7 minutes    Seated HS curl  GTB x 20 reps each    Toe taps on step   8 step x 15 reps each  BUE support from parallel bars   Static stance on foam  2 minutes  Increased sway   Standing gastroc stretch  4 x 30 seconds    Standing heel raise   30 reps  BUE support from parallel bars   Sit to stand   15 reps  Without UE support    Blank cell = exercise not performed today                                    05/25/24 EXERCISE LOG  Exercise Repetitions and Resistance Comments  Nustep  L3 x 5 minutes    Rocker board  2 minutes each BUE support from parallel bars; AP and lateral    Standing gastroc stretch   4 x 30 seconds    Tandem stance  3 x 30 seconds each  Intermittent UE support   Sit to stand  15 reps  Without UE support   Resisted pull down  RTB x 13 reps  With static stance on foam   Seated HS curl   RTB x 15 reps each     Blank cell = exercise not performed today   05/17/24 Standing: Heel raises 10x Toe raises 10x  Tandem stance 2x 30 SLS Rt 7, Lt 2 Sidestep 2RT with QC  Slant board 3x 30  Discussed supportive shoes with recommendations including higher heel to toe drop, good shock absorportion, stability to assist with controlled movements side to side; recommended brands and stores to shop at  Seated BAPS L3 10 Df/Pf then Inv/Ev with therapist controlling  knee   11/05: Vitals: BP Lt  UE in seated position: 102/69 mmHg, HR 84 bpm DGI 1. Gait level surface (2) Mild Impairment: Walks 20', uses assistive devices, slower speed, mild gait deviations. 2. Change in gait speed (2) Mild Impairment: Is able to change speed but demonstrates mild gait deviations, or not gait deviations but unable to achieve a significant change in velocity, or uses an assistive device. 3. Gait with horizontal head turns (1) Moderate Impairment: Performs head turns with moderate change in gait velocity, slows down, staggers but recovers, can continue to walk. 4. Gait with vertical head turns 1) Moderate Impairment: Performs head turns with moderate change in gait velocity, slows down, staggers but recovers, can continue to walk. 5. Gait and pivot turn (1) Moderate Impairment: Turns slowly, requires verbal cueing, requires several small steps to catch balance following turn and stop. 6. Step over obstacle (1) Moderate Impairment: Is able to step over box but must stop, then step over. May require verbal cueing. 7. Step around obstacles (1) Moderate Impairment: Is able to clear cones but must significantly slow, speed to accomplish task, or requires verbal cueing. 8. Stairs (1) Moderate Impairment: Two feet to a stair, must use rail.  TOTAL SCORE: 10 / 24  Heel raises incline slope 10x 3 2 sets Toe raises decline slope 10x3 Tandem stance 2x 30 Slant board 2x 30                                  05/01/24 EXERCISE LOG  Exercise Repetitions and Resistance Comments  Nustep  L3 x 5 minutes   Seated heel/toe raises   2.5 minutes  Alternating heel and toe raise   LAQ  2# x 2.5 minutes  Alternating LE   Sit to stand   10 reps  With UE support from armrests   Standing gastroc stretch  2 minutes    TUG  25.99 seconds w/ quad cane    2 MWT  217 feet w/ quad cane    Blank cell = exercise not performed today   PATIENT EDUCATION:  Education details: HEP and plan for  next appointment Person educated: Patient Education method: Explanation Education comprehension: verbalized understanding  HOME EXERCISE PROGRAM: Access Code: [email or web address] URL: [email or web address] Date: 05/01/2024 Prepared by: Lacinda Fass  Exercises - Sit to Stand  - 1 x daily - 7 x weekly - 3 sets - 10 reps - Seated Heel Toe Raises  - 1 x daily - 7 x weekly - 3 sets - 10 reps - Seated Gastroc Stretch with Strap  - 1 x daily - 7 x weekly - 3-4 sets - 30 seconds hold  Access Code: [email or web address] URL: [email or web address] Date: 04/26/2024 Prepared by: Rosaria Powell-Butler  Exercises - Heel Raises with Counter Support  - 2 x daily - 7 x weekly - 2 sets - 10 reps - Heel Toe Raises with Counter Support  - 2 x daily - 7 x weekly - 2 sets - 10 reps - Seated Dorsiflexion Stretch  - 2 x daily - 7 x weekly - 2 sets - 10 reps - 5 hold  ASSESSMENT:  CLINICAL IMPRESSION:  Patient is making good progress with skilled physical therapy as evidenced by her subjective reports, objective measures, functional mobility, and progress toward her goals. She was able to meet her long-term LEFS goal for improved perceived function with her daily activities. However, she was unable to meet her AROM, TUG, and 30 second sit to  stand goals at this time. This could partially be attributed to due to recent illness which limited her ability to attend physical therapy. She was unable to complete her full physical therapy session today as she arrived late to her appointment. She reported feeling good upon the conclusion of treatment. Patient continues to require skilled physical therapy to address her remaining impairments to return to her prior level of function.     Eval: Patient is a 83 y.o. female who was seen today for physical therapy evaluation and treatment for  M76.61,M76.62 (ICD-10-CM) - Achilles tendinitis of both lower extremities  . On this date, patient demonstrates impaired self  perception of function, decreased ankle ROM, decreased strength and decreased endurance, all of which may be contributing to patients altered gait pattern, increased pain, and difficulty with functional transfers. Educated patient on proper shoe wear as she is wearing very flat shoes. Explained how shoes with better arch support may help to limit her discomfort in her heels. Plan to give p print off examples in next sessions. Time limited during evaluation due to late arrival. Patient will benefit from skilled physical therapy in order to address current deficits to improve gait mechanics, balance, pain, and overall function to improved quality of life.   OBJECTIVE IMPAIRMENTS: Abnormal gait, cardiopulmonary status limiting activity, decreased activity tolerance, decreased balance, decreased endurance, decreased mobility, difficulty walking, decreased ROM, decreased strength, dizziness, impaired flexibility, improper body mechanics, postural dysfunction, and pain.   ACTIVITY LIMITATIONS: lifting, bending, sitting, standing, squatting, stairs, and transfers  PARTICIPATION LIMITATIONS: meal prep, cleaning, laundry, and community activity  PERSONAL FACTORS: N/A are also affecting patient's functional outcome.   REHAB POTENTIAL: Good  CLINICAL DECISION MAKING: Stable/uncomplicated  EVALUATION COMPLEXITY: Low   GOALS: Goals reviewed with patient? No  SHORT TERM GOALS: Target date: 05/17/24 Patient will be independent with performance of HEP to demonstrate adequate self management of symptoms.  Baseline: I have not done my exercises since I got sick Goal status: PARTIALLY MET   2.   Patient will report at least a 25% improvement with function and/or pain reduction overall since beginning PT. Baseline: 70-80% improvement Goal status: MET   LONG TERM GOALS: Target date: 06/07/24 Patient will improve LEFS score by 9 points to demonstrate improved perceived function while meeting MCID.   Baseline: 06/11/24: 36/80 (10 point improvement)  Goal status: MET 2.  Patient will improve  ankle DF  ROM bilaterally to at least 10 degrees to demonstrate improved LE mobility needed for functional transfers and gait mechanics.  Baseline: 06/11/24: L: 8 degrees R: 8 degrees  Goal status: ON GOING  3.  Patient will perform TUG in 12 seconds or less to demonstrate decreased risk of falls.  Baseline: 16.41 seconds on 06/11/24 Goal status: ON GOING   4.  Patient will improve 30 second chair stand test by at least 2 STS in order to demonstrate improved LE strength/power needed for community ambulation.  Baseline: 11 reps on 06/11/24 (improved by 1 rep)  Goal status: ON GOING   5. Patient will report daily pain at 4/10 or less with conclusion of her daily activities to demonstrate Improved activity tolerance and pain reduction.  Baseline: 4-5/10 on 06/11/24 Goal status: NEARLY MET    PLAN:  PT FREQUENCY: 2x/week  PT DURATION: 6 weeks  PLANNED INTERVENTIONS: [id number]- PT Re-evaluation, 97110-Therapeutic exercises, 97530- Therapeutic activity, [id number]- Neuromuscular re-education, 97535- Self Care, [id number]- Manual therapy, [id number]- Gait training, [id number]- Electrical stimulation (manual), [id number]- Ultrasound, [id number]- Traction (mechanical), [phone] (  1-2 muscles), [id number] (3+ muscles)- Dry Needling, Patient/Family education, Balance training, Stair training, Taping, Joint mobilization, Spinal mobilization, Cryotherapy, and Moist heat  PLAN FOR NEXT SESSION: F/U on appropriate shoes with good arch support PRN.  Prog ROM and strengthening activities.   Lacinda Fass, PT, DPT  5:51 PM, 06/11/24

## 2024-06-13 ENCOUNTER — Ambulatory Visit (HOSPITAL_COMMUNITY)

## 2024-06-13 ENCOUNTER — Encounter (HOSPITAL_COMMUNITY): Payer: Self-pay

## 2024-06-13 DIAGNOSIS — M79671 Pain in right foot: Secondary | ICD-10-CM

## 2024-06-13 DIAGNOSIS — M25672 Stiffness of left ankle, not elsewhere classified: Secondary | ICD-10-CM

## 2024-06-13 DIAGNOSIS — Z7409 Other reduced mobility: Secondary | ICD-10-CM

## 2024-06-13 NOTE — Therapy (Signed)
 OUTPATIENT PHYSICAL THERAPY LOWER EXTREMITY TREATMENT   Patient Name: Peggy French MRN: [id number] DOB:[birth year], 83 y.o., female Today's Date: 06/13/2024  END OF SESSION:  PT End of Session - 06/13/24 1552     Visit Number 8    Number of Visits 13    Date for Recertification  05/24/24    Authorization Type BCBS Medicare    Authorization Time Period [employer] approved 13 visits from 04/26/2024-10/22/2024    Authorization - Visit Number 8    Authorization - Number of Visits 13    Progress Note Due on Visit 10    PT Start Time 1553   late arrival   PT Stop Time 1625    PT Time Calculation (min) 32 min    Activity Tolerance Patient tolerated treatment well    Behavior During Therapy [facility] for tasks assessed/performed               Past Medical History:  Diagnosis Date   Asthmatic bronchitis 2011   Colon carcinoma metastatic to 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   a. s/p right hemicolectomy/chemo 2013 with mets to the lung s/p LLL wedge resection 2014.   Diabetes mellitus    x over 10 yrs   GERD (gastroesophageal reflux disease)    Gout    H/O hiatal hernia    History of colon cancer    History of shingles    Hyperlipidemia    Hypertension    EKG 10/12 EPIC, chest- 1 view  6/13 EPIC    Last 2D Echo on 05/02/2012 showed EF of greater than 55%   Microcytic anemia    Mild CAD    a. cath by Dr. Mona in 03/2011 - 20-30% ostial bifurcation stenosis of LAD, otherwise only mild luminal irregularities in LAD/RCA, LVEF 60%, no RWMA.   c. 06/18/16 [facility] after abnormal nuc showed mild non obst CAD   Neuropathy    Obesity    Past Surgical History:  Procedure Laterality Date   ABDOMINAL HYSTERECTOMY     APPENDECTOMY     CARDIAC CATHETERIZATION  2001,2012   CARDIAC CATHETERIZATION N/A 06/18/2016   Procedure: Left Heart Cath and Coronary Angiography;  Surgeon: Lonni Hanson, MD;  Location: [facility] INVASIVE CV LAB;  Service: Cardiovascular;  Laterality: N/A;   CATARACT EXTRACTION W/PHACO Left  02/17/2015   Procedure: CATARACT EXTRACTION PHACO AND INTRAOCULAR LENS PLACEMENT (IOC);  Surgeon: Cherene Mania, MD;  Location: [facility];  Service: Ophthalmology;  Laterality: Left;  CDE:8.01    CATARACT EXTRACTION W/PHACO Right 08/03/2021   Procedure: CATARACT EXTRACTION PHACO AND INTRAOCULAR LENS PLACEMENT (IOC);  Surgeon: Harrie Agent, MD;  Location: [facility];  Service: Ophthalmology;  Laterality: Right;  CDE: 13.97   CHOLECYSTECTOMY     COLON SURGERY     PARTIAL COLECTOMY 30 YRS / AGAIN 2013   COLONOSCOPY  11/26/2011   Procedure: COLONOSCOPY;  Surgeon: Claudis RAYMOND Rivet, MD;  Location: [facility];  Service: Endoscopy;  Laterality: N/A;  2:15   COLONOSCOPY N/A 12/08/2012   Procedure: COLONOSCOPY;  Surgeon: Claudis RAYMOND Rivet, MD;  Location: [facility];  Service: Endoscopy;  Laterality: N/A;  815-moved to 0950 Ann to notify pt   COLONOSCOPY N/A 03/25/2016   Procedure: COLONOSCOPY;  Surgeon: Claudis RAYMOND Rivet, MD;  Location: [facility];  Service: Endoscopy;  Laterality: N/A;  1200   COLONOSCOPY WITH PROPOFOL  N/A 07/29/2021   Procedure: COLONOSCOPY WITH PROPOFOL ;  Surgeon: Rivet Claudis RAYMOND, MD;  Location: [facility];  Service: Endoscopy;  Laterality: N/A;  11:10   ESOPHAGOGASTRODUODENOSCOPY  06/16/2011   Procedure: ESOPHAGOGASTRODUODENOSCOPY (EGD);  Surgeon: Claudis RAYMOND Rivet, MD;  Location: [facility];  Service: Endoscopy;  Laterality: N/A;  2:15   POLYPECTOMY  07/29/2021   Procedure: POLYPECTOMY;  Surgeon: Rivet Claudis RAYMOND, MD;  Location: [facility];  Service: Endoscopy;;  transverse colon   PORT-A-CATH REMOVAL N/A 06/04/2014   Procedure: REMOVAL PORT-A-CATH;  Surgeon: Donnice KATHEE Lunger, MD;  Location: [facility];  Service: General;  Laterality: N/A;   PORTACATH PLACEMENT  02/22/2012   Procedure: INSERTION PORT-A-CATH;  Surgeon: Donnice KATHEE Lunger, MD;  Location: [facility];  Service: General;  Laterality: N/A;  left subclavian   VIDEO ASSISTED THORACOSCOPY (VATS)/WEDGE RESECTION Left 03/27/2013    Procedure: VIDEO ASSISTED THORACOSCOPY (VATS)/WEDGE RESECTION;  Surgeon: Dallas KATHEE Jude, MD;  Location: [facility] OR;  Service: Thoracic;  Laterality: Left;   VIDEO BRONCHOSCOPY N/A 03/27/2013   Procedure: VIDEO BRONCHOSCOPY;  Surgeon: [place] KATHEE Jude, MD;  Location: [facility] OR;  Service: Thoracic;  Laterality: N/A;   Patient Active Problem List   Diagnosis Date Noted   PAD (peripheral artery disease) 11/15/2023   GERD (gastroesophageal reflux disease) 09/29/2023   Pain due to onychomycosis of toenails of both feet 04/28/2022   Chest pain 09/29/2020   Moderate risk chest pain 06/24/2016   Mixed hyperlipidemia 06/24/2016   Unstable angina (HCC) 06/19/2016   Mild CAD    Chest pain with moderate risk of acute coronary syndrome 06/17/2015   Type 2 diabetes mellitus with obesity 06/17/2015   Obesity (BMI 30-39.9)    Colon carcinoma metastatic to 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   S/P right colectomy 01/07/2012   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 Pt states pain is a 5-6/10 this date. Pt states she took yesterday off from HEP.  Eval: Pt reports pain has been going on a while. Reports they told her she had bone spurs on both sides. Reports her ankle pain flares up every now and then. Reports she feels burning in calves sometimes.  PERTINENT HISTORY: 83 y.o. female presents with the above complaint. History confirmed with patient.  Her diabetes is well-controlled  nails are thickened elongated and causing discomfort.  Previous debridement was helpful.  Blood sugar remains well-controlled.  Her Achilles tendon pain has returned and is worse in the back of both heels.  Physical Exam: warm, good capillary refill, no trophic changes or ulcerative lesions, normal DP and PT pulses, and abnormal sensory exam.  Pain to palpation of the insertion of the posterior Achilles bilateral Left Foot: dystrophic yellowed discolored nail plates with subungual debris Right Foot: dystrophic yellowed discolored nail plates with subungual debris.   Pt report history of vertigo DM Gout Neuropathy Cardiac issues  PAIN:  Are you having pain? Yes: NPRS scale: 5-6/10 Pain location: back of heels, gastrocs Pain description: Burning pain  Aggravating factors: Sitting too long Relieving factors: raising feet on bed   PRECAUTIONS: None  RED FLAGS: None Pt  reports neuropathy in feet bilaterally due to diabetes, and cramping in hands bilaterally, B/B issues due to previous colon cancer  WEIGHT BEARING RESTRICTIONS: No  FALLS:  Has patient fallen in last 6 months? No, reports 2 near misses just today  LIVING ENVIRONMENT: Lives with: lives with their son, caregiver for son who has dementia Lives in: House/apartment Stairs: No has ramp, one step into laundry area. Has following equipment at home: Single point cane  OCCUPATION: N/A  PLOF: Independent but gets Meals on Wheels  PATIENT GOALS:  To get back to where I can help myself and help my son, being more active, go to church   NEXT MD VISIT: January 2026  OBJECTIVE:  Note: Objective measures were completed at Evaluation unless otherwise noted.  DIAGNOSTIC FINDINGS: N/A  PATIENT SURVEYS:  LEFS  Extreme difficulty/unable (0), Quite a bit of difficulty (1), Moderate difficulty (2), Little difficulty (3), No difficulty (4) Survey date:    Any of your usual work, housework or school activities   2. Usual  hobbies, recreational or sporting activities   3. Getting into/out of the bath   4. Walking between rooms   5. Putting on socks/shoes   6. Squatting    7. Lifting an object, like a bag of groceries from the floor   8. Performing light activities around your home   9. Performing heavy activities around your home   10. Getting into/out of a car   11. Walking 2 blocks   12. Walking 1 mile   13. Going up/down 10 stairs (1 flight)   14. Standing for 1 hour   15.  sitting for 1 hour   16. Running on even ground   17. Running on uneven ground   18. Making sharp turns while running fast   19. Hopping    20. Rolling over in bed   Score total:  Lower Extremity Functional Score: 26 / 80 = 32.5 %      COGNITION: Overall cognitive status: Within functional limits for tasks assessed     SENSATION: WFL  POSTURE: rounded shoulders and forward head   LOWER EXTREMITY ROM:  Active ROM Right eval Left eval  Hip flexion    Hip extension    Hip abduction    Hip adduction    Hip internal rotation    Hip external rotation    Knee flexion    Knee extension    Ankle dorsiflexion 3 7  Ankle plantarflexion 40 42  Ankle inversion    Ankle eversion     (Blank rows = not tested)  LOWER EXTREMITY MMT:  MMT Right eval Left eval  Hip flexion    Hip extension    Hip abduction    Hip adduction    Hip internal rotation    Hip external rotation    Knee flexion    Knee extension 4 * burning pain 4 * burning pain   Ankle dorsiflexion 5 5  Ankle plantarflexion    Ankle inversion    Ankle eversion     (Blank rows = not tested)  LOWER EXTREMITY SPECIAL TESTS:    FUNCTIONAL TESTS:  30 seconds chair stand test Timed up and go (TUG): Next session 2 minute walk test: next session  30 second chair stand: 10 STS, slight burning in heels GAIT: Distance walked: 100 ft in session  Assistive device utilized: Single point cane Level of assistance: Modified independence Comments: SPC in R  hand, dec velocity, WBOS, dec hip flex/ext  bilat, dec stance time on LLE, dec IC/DF and push off/PF moments bilat                                                                                                                                TREATMENT DATE:  06/13/2024  Therapeutic Exercise: -Stationary bike, 5 minutes, level 4>2 resistance, pt cued for above 45 spm but has to drop to mid 30s for a while -Rocker board all planes, 20 in each dircetion, pt cued for BUE support for safety Neuromuscular Re-education: -Dorsiflexion marches from 6 inch step, 3 sets of 5 reps bilaterally, in parallel bars, 5lb kettle bell, pt cued for max hip and ankle ROM -Heel raises, 1 sets of 20 reps, pt cued for decreased UE support for increased balance challenge -Aeromat walks, tandem and lateral stepping, 1 lap each variation, pt cued for decreased UE support                                     06/11/24 EXERCISE LOG  Exercise Repetitions and Resistance Comments  Nustep  L4 x 8 minutes    Goal assessment for progress report   See below                 Blank cell = exercise not performed today                                    05/29/24 EXERCISE LOG  Exercise Repetitions and Resistance Comments  Nustep  L3 x 7 minutes    Seated HS curl  GTB x 20 reps each    Toe taps on step   8 step x 15 reps each  BUE support from parallel bars   Static stance on foam  2 minutes  Increased sway   Standing gastroc stretch  4 x 30 seconds    Standing heel raise   30 reps  BUE support from parallel bars   Sit to stand   15 reps  Without UE support    Blank cell = exercise not performed today                                     PATIENT EDUCATION:  Education details: HEP and plan for next appointment Person educated: Patient Education method: Explanation Education comprehension: verbalized understanding  HOME EXERCISE PROGRAM: Access Code: [email or web address] URL: [email or web address] Date:  05/01/2024 Prepared by: Lacinda Fass  Exercises - Sit to Stand  - 1 x daily - 7 x weekly - 3 sets - 10 reps - Seated Heel Toe Raises  - 1 x daily - 7 x weekly - 3 sets - 10 reps - Seated Gastroc Stretch with Strap  -  1 x daily - 7 x weekly - 3-4 sets - 30 seconds hold  Access Code: [email or web address] URL: [email or web address] Date: 04/26/2024 Prepared by: Rosaria Powell-Butler  Exercises - Heel Raises with Counter Support  - 2 x daily - 7 x weekly - 2 sets - 10 reps - Heel Toe Raises with Counter Support  - 2 x daily - 7 x weekly - 2 sets - 10 reps - Seated Dorsiflexion Stretch  - 2 x daily - 7 x weekly - 2 sets - 10 reps - 5 hold  ASSESSMENT:  CLINICAL IMPRESSION:  Patient continues to demonstrate decreased LE strength/ROM, decreased gait quality and balance. Patient also demonstrates increased endurance with aerobic based exercise during today's session although has to slow down on stationary bike due to fatigue. Patient able to progress dynamic balance and core activation exercises today with marching variations and rocker board variations, good performance with verbal cueing. Patient would continue to benefit from skilled physical therapy for increased endurance with ambulation, increased LE strength/ROM, and improved balance for improved quality of life, improved independence with gait training and continued progress towards therapy goals.      Eval: Patient is a 83 y.o. female who was seen today for physical therapy evaluation and treatment for  M76.61,M76.62 (ICD-10-CM) - Achilles tendinitis of both lower extremities  . On this date, patient demonstrates impaired self perception of function, decreased ankle ROM, decreased strength and decreased endurance, all of which may be contributing to patients altered gait pattern, increased pain, and difficulty with functional transfers. Educated patient on proper shoe wear as she is wearing very flat shoes. Explained how shoes with better  arch support may help to limit her discomfort in her heels. Plan to give p print off examples in next sessions. Time limited during evaluation due to late arrival. Patient will benefit from skilled physical therapy in order to address current deficits to improve gait mechanics, balance, pain, and overall function to improved quality of life.   OBJECTIVE IMPAIRMENTS: Abnormal gait, cardiopulmonary status limiting activity, decreased activity tolerance, decreased balance, decreased endurance, decreased mobility, difficulty walking, decreased ROM, decreased strength, dizziness, impaired flexibility, improper body mechanics, postural dysfunction, and pain.   ACTIVITY LIMITATIONS: lifting, bending, sitting, standing, squatting, stairs, and transfers  PARTICIPATION LIMITATIONS: meal prep, cleaning, laundry, and community activity  PERSONAL FACTORS: N/A are also affecting patient's functional outcome.   REHAB POTENTIAL: Good  CLINICAL DECISION MAKING: Stable/uncomplicated  EVALUATION COMPLEXITY: Low   GOALS: Goals reviewed with patient? No  SHORT TERM GOALS: Target date: 05/17/24 Patient will be independent with performance of HEP to demonstrate adequate self management of symptoms.  Baseline: I have not done my exercises since I got sick Goal status: PARTIALLY MET   2.   Patient will report at least a 25% improvement with function and/or pain reduction overall since beginning PT. Baseline: 70-80% improvement Goal status: MET   LONG TERM GOALS: Target date: 06/07/24 Patient will improve LEFS score by 9 points to demonstrate improved perceived function while meeting MCID.  Baseline: 06/11/24: 36/80 (10 point improvement)  Goal status: MET 2.  Patient will improve  ankle DF  ROM bilaterally to at least 10 degrees to demonstrate improved LE mobility needed for functional transfers and gait mechanics.  Baseline: 06/11/24: L: 8 degrees R: 8 degrees  Goal status: ON GOING  3.  Patient will  perform TUG in 12 seconds or less to demonstrate decreased risk of falls.  Baseline: 16.41 seconds on 06/11/24 Goal status: ON GOING  4.  Patient will improve 30 second chair stand test by at least 2 STS in order to demonstrate improved LE strength/power needed for community ambulation.  Baseline: 11 reps on 06/11/24 (improved by 1 rep)  Goal status: ON GOING   5. Patient will report daily pain at 4/10 or less with conclusion of her daily activities to demonstrate Improved activity tolerance and pain reduction.  Baseline: 4-5/10 on 06/11/24 Goal status: NEARLY MET    PLAN:  PT FREQUENCY: 2x/week  PT DURATION: 6 weeks  PLANNED INTERVENTIONS: 97164- PT Re-evaluation, 97110-Therapeutic exercises, 97530- Therapeutic activity, 97112- Neuromuscular re-education, 97535- Self Care, 02859- Manual therapy, [id number]- Gait training, [phone]- Electrical stimulation (manual), [id number]- Ultrasound, 02987- Traction (mechanical), [phone] (1-2 muscles), [id number] (3+ muscles)- Dry Needling, Patient/Family education, Balance training, Stair training, Taping, Joint mobilization, Spinal mobilization, Cryotherapy, and Moist heat  PLAN FOR NEXT SESSION: F/U on appropriate shoes with good arch support PRN.  Prog ROM and strengthening activities. Extending cert for 4 weeks for approved visits.    Quaniya Damas, PT, DPT [facility] Office: [phone] 4:30 PM, 06/13/24

## 2024-06-18 ENCOUNTER — Ambulatory Visit (HOSPITAL_COMMUNITY)

## 2024-06-21 ENCOUNTER — Encounter (HOSPITAL_COMMUNITY): Payer: Self-pay

## 2024-06-21 ENCOUNTER — Ambulatory Visit (HOSPITAL_COMMUNITY)

## 2024-06-21 DIAGNOSIS — M25671 Stiffness of right ankle, not elsewhere classified: Secondary | ICD-10-CM

## 2024-06-21 DIAGNOSIS — M79671 Pain in right foot: Secondary | ICD-10-CM

## 2024-06-21 DIAGNOSIS — Z7409 Other reduced mobility: Secondary | ICD-10-CM

## 2024-06-21 NOTE — Therapy (Signed)
 OUTPATIENT PHYSICAL THERAPY LOWER EXTREMITY TREATMENT   Patient Name: Peggy French MRN: [id number] DOB:1940/09/18, 83 y.o., female Today's Date: 06/21/2024  END OF SESSION:  PT End of Session - 06/21/24 1558     Visit Number 9    Number of Visits 13    Date for Recertification  07/12/24    Authorization Type BCBS Medicare    Authorization Time Period [employer] approved 13 visits from 04/26/2024-10/22/2024    Authorization - Visit Number 9    Authorization - Number of Visits 13    Progress Note Due on Visit 13   progress note performed on visit 7   PT Start Time 1600   pt late arrival   PT Stop Time 1630    PT Time Calculation (min) 30 min    Activity Tolerance Patient tolerated treatment well    Behavior During Therapy [facility] for tasks assessed/performed               Past Medical History:  Diagnosis Date   Asthmatic bronchitis 2011   Colon carcinoma metastatic to 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   a. s/p right hemicolectomy/chemo 2013 with mets to the lung s/p LLL wedge resection 2014.   Diabetes mellitus    x over 10 yrs   GERD (gastroesophageal reflux disease)    Gout    H/O hiatal hernia    History of colon cancer    History of shingles    Hyperlipidemia    Hypertension    EKG 10/12 EPIC, chest- 1 view  6/13 EPIC    Last 2D Echo on 05/02/2012 showed EF of greater than 55%   Microcytic anemia    Mild CAD    a. cath by Dr. Mona in 03/2011 - 20-30% ostial bifurcation stenosis of LAD, otherwise only mild luminal irregularities in LAD/RCA, LVEF 60%, no RWMA.   c. 06/18/16 LHC after abnormal nuc showed mild non obst CAD   Neuropathy    Obesity    Past Surgical History:  Procedure Laterality Date   ABDOMINAL HYSTERECTOMY     APPENDECTOMY     CARDIAC CATHETERIZATION  2001,2012   CARDIAC CATHETERIZATION N/A 06/18/2016   Procedure: Left Heart Cath and Coronary Angiography;  Surgeon: Lonni Hanson, MD;  Location: [facility] INVASIVE CV LAB;  Service: Cardiovascular;  Laterality: N/A;    CATARACT EXTRACTION W/PHACO Left 02/17/2015   Procedure: CATARACT EXTRACTION PHACO AND INTRAOCULAR LENS PLACEMENT (IOC);  Surgeon: Cherene Mania, MD;  Location: [facility] ORS;  Service: Ophthalmology;  Laterality: Left;  CDE:8.01    CATARACT EXTRACTION W/PHACO Right 08/03/2021   Procedure: CATARACT EXTRACTION PHACO AND INTRAOCULAR LENS PLACEMENT (IOC);  Surgeon: Harrie Agent, MD;  Location: [facility];  Service: Ophthalmology;  Laterality: Right;  CDE: 13.97   CHOLECYSTECTOMY     COLON SURGERY     PARTIAL COLECTOMY 30 YRS / AGAIN 2013   COLONOSCOPY  11/26/2011   Procedure: COLONOSCOPY;  Surgeon: Claudis RAYMOND Rivet, MD;  Location: [facility];  Service: Endoscopy;  Laterality: N/A;  2:15   COLONOSCOPY N/A 12/08/2012   Procedure: COLONOSCOPY;  Surgeon: Claudis RAYMOND Rivet, MD;  Location: [facility];  Service: Endoscopy;  Laterality: N/A;  815-moved to 0950 Ann to notify pt   COLONOSCOPY N/A 03/25/2016   Procedure: COLONOSCOPY;  Surgeon: Claudis RAYMOND Rivet, MD;  Location: [facility];  Service: Endoscopy;  Laterality: N/A;  1200   COLONOSCOPY WITH PROPOFOL  N/A 07/29/2021   Procedure: COLONOSCOPY WITH PROPOFOL ;  Surgeon: Rivet Claudis RAYMOND, MD;  Location: [facility]  END[facility];  Service: Endoscopy;  Laterality: N/A;  11:10   ESOPHAGOGASTRODUODENOSCOPY  06/16/2011   Procedure: ESOPHAGOGASTRODUODENOSCOPY (EGD);  Surgeon: Claudis RAYMOND Rivet, MD;  Location: [facility];  Service: Endoscopy;  Laterality: N/A;  2:15   POLYPECTOMY  07/29/2021   Procedure: POLYPECTOMY;  Surgeon: Rivet Claudis RAYMOND, MD;  Location: [facility];  Service: Endoscopy;;  transverse colon   PORT-A-CATH REMOVAL N/A 06/04/2014   Procedure: REMOVAL PORT-A-CATH;  Surgeon: Donnice KATHEE Lunger, MD;  Location: [facility] ORS;  Service: General;  Laterality: N/A;   PORTACATH PLACEMENT  02/22/2012   Procedure: INSERTION PORT-A-CATH;  Surgeon: Donnice KATHEE Lunger, MD;  Location: [facility] ORS;  Service: General;  Laterality: N/A;  left subclavian   VIDEO ASSISTED THORACOSCOPY (VATS)/WEDGE  RESECTION Left 03/27/2013   Procedure: VIDEO ASSISTED THORACOSCOPY (VATS)/WEDGE RESECTION;  Surgeon: Dallas KATHEE Jude, MD;  Location: [facility] OR;  Service: Thoracic;  Laterality: Left;   VIDEO BRONCHOSCOPY N/A 03/27/2013   Procedure: VIDEO BRONCHOSCOPY;  Surgeon: [place] KATHEE Jude, MD;  [facility]: [facility] OR;  Service: Thoracic;  Laterality: N/A;   Patient Active Problem List   Diagnosis Date Noted   PAD (peripheral artery disease) 11/15/2023   GERD (gastroesophageal reflux disease) 09/29/2023   Pain due to onychomycosis of toenails of both feet 04/28/2022   Chest pain 09/29/2020   Moderate risk chest pain 06/24/2016   Mixed hyperlipidemia 06/24/2016   Unstable angina (HCC) 06/19/2016   Mild CAD    Chest pain with moderate risk of acute coronary syndrome 06/17/2015   Type 2 diabetes mellitus with obesity 06/17/2015   Obesity (BMI 30-39.9)    Colon carcinoma metastatic to 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   S/P right colectomy 01/07/2012   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 Reports heel pain was achy last night, not as bad right now.    Eval: Pt reports pain has been going on a while. Reports they told her she had bone spurs on both sides. Reports her ankle pain flares up every now and then. Reports she feels burning in calves sometimes.  PERTINENT HISTORY: 83 y.o. female presents with the above complaint. History confirmed with patient.  Her diabetes is  well-controlled nails are thickened elongated and causing discomfort.  Previous debridement was helpful.  Blood sugar remains well-controlled.  Her Achilles tendon pain has returned and is worse in the back of both heels.  Physical Exam: warm, good capillary refill, no trophic changes or ulcerative lesions, normal DP and PT pulses, and abnormal sensory exam.  Pain to palpation of the insertion of the posterior Achilles bilateral Left Foot: dystrophic yellowed discolored nail plates with subungual debris Right Foot: dystrophic yellowed discolored nail plates with subungual debris.   Pt report history of vertigo DM Gout Neuropathy Cardiac issues  PAIN:  Are you having pain? Yes: NPRS scale: 5-6/10 Pain location: back of heels, gastrocs Pain description: Burning pain  Aggravating factors: Sitting too long Relieving factors: raising feet on bed  PRECAUTIONS: None  RED FLAGS: None Pt reports neuropathy in feet bilaterally due to diabetes, and cramping in hands bilaterally, B/B issues due to previous colon cancer  WEIGHT BEARING RESTRICTIONS: No  FALLS:  Has patient fallen in last 6 months? No, reports 2 near misses just today  LIVING ENVIRONMENT: Lives with: lives with their son, caregiver for son who has dementia Lives in: House/apartment Stairs: No has ramp, one step into laundry area. Has following equipment at home: Single point cane  OCCUPATION: N/A  PLOF: Independent but gets Meals on Wheels  PATIENT GOALS:  To get back to where I can help myself and help my son, being more active, go to church   NEXT MD VISIT: January 2026  OBJECTIVE:  Note: Objective measures were completed at Evaluation unless otherwise noted.  DIAGNOSTIC FINDINGS: N/A  PATIENT SURVEYS:  LEFS  Extreme difficulty/unable (0), Quite a bit of difficulty (1), Moderate difficulty (2), Little difficulty (3), No difficulty (4) Survey date:    Any of your usual work, housework or school activities    2. Usual hobbies, recreational or sporting activities   3. Getting into/out of the bath   4. Walking between rooms   5. Putting on socks/shoes   6. Squatting    7. Lifting an object, like a bag of groceries from the floor   8. Performing light activities around your home   9. Performing heavy activities around your home   10. Getting into/out of a car   11. Walking 2 blocks   12. Walking 1 mile   13. Going up/down 10 stairs (1 flight)   14. Standing for 1 hour   15.  sitting for 1 hour   16. Running on even ground   17. Running on uneven ground   18. Making sharp turns while running fast   19. Hopping    20. Rolling over in bed   Score total:  Lower Extremity Functional Score: 26 / 80 = 32.5 %      COGNITION: Overall cognitive status: Within functional limits for tasks assessed     SENSATION: WFL  POSTURE: rounded shoulders and forward head   LOWER EXTREMITY ROM:  Active ROM Right eval Left eval  Hip flexion    Hip extension    Hip abduction    Hip adduction    Hip internal rotation    Hip external rotation    Knee flexion    Knee extension    Ankle dorsiflexion 3 7  Ankle plantarflexion 40 42  Ankle inversion    Ankle eversion     (Blank rows = not tested)  LOWER EXTREMITY MMT:  MMT Right eval Left eval  Hip flexion    Hip extension    Hip abduction    Hip adduction    Hip internal rotation    Hip external rotation    Knee flexion    Knee extension 4 * burning pain 4 * burning pain   Ankle dorsiflexion 5 5  Ankle plantarflexion    Ankle inversion    Ankle eversion     (Blank rows = not tested)  LOWER EXTREMITY SPECIAL TESTS:    FUNCTIONAL TESTS:  30 seconds chair stand test Timed up and go (TUG): Next session 2 minute walk test: next session  30 second chair stand: 10 STS, slight burning in heels GAIT: Distance walked: 100 ft in session  Assistive device utilized: Single point cane Level of assistance: Modified  independence Comments: SPC in R  hand, dec velocity, WBOS, dec hip flex/ext bilat, dec stance time on LLE, dec IC/DF and push off/PF moments bilat                                                                                                                                TREATMENT DATE:  06/21/24: Heel raises on incline at // bars, 10  Prog to SL heel raises, 10x each side Toe raises on decline, 2x10 Fitter Board, DF/PF, 1'  Sup/pronation, 1' each foot one at a time  Circles, 1' each foot one at a time Standing marching, resistance at forefoot, GTB, verbal cues to maintain neutral DF, 2' Stationary bike, 3 minutes, level 3 resistance, seat 8   06/13/2024  Therapeutic Exercise: -Stationary bike, 5 minutes, level 4>2 resistance, pt cued for above 45 spm but has to drop to mid 30s for a while -Rocker board all planes, 20 in each dircetion, pt cued for BUE support for safety Neuromuscular Re-education: -Dorsiflexion marches from 6 inch step, 3 sets of 5 reps bilaterally, in parallel bars, 5lb kettle bell, pt cued for max hip and ankle ROM -Heel raises, 1 sets of 20 reps, pt cued for decreased UE support for increased balance challenge -Aeromat walks, tandem and lateral stepping, 1 lap each variation, pt cued for decreased UE support                                     06/11/24 EXERCISE LOG  Exercise Repetitions and Resistance Comments  Nustep  L4 x 8 minutes    Goal assessment for progress report   See below                 Blank cell = exercise not performed today                             PATIENT EDUCATION:  Education details: HEP and plan for next appointment Person educated: Patient Education method: Explanation Education comprehension: verbalized understanding  HOME EXERCISE PROGRAM: Access Code: [email or web address] URL: [email or web address] Date: 05/01/2024 Prepared by: Lacinda Fass  Exercises - Sit to Stand  - 1 x daily - 7 x weekly - 3 sets - 10 reps -  Seated Heel Toe Raises  - 1 x daily - 7 x weekly - 3 sets - 10 reps - Seated Gastroc Stretch with Strap  - 1 x daily - 7 x weekly - 3-4 sets - 30 seconds hold  Access Code: [email or web address] URL: [email or web address] Date: 04/26/2024 Prepared by: Rosaria Powell-Butler  Exercises - Heel Raises with Counter Support  - 2 x daily - 7 x weekly - 2 sets - 10 reps - Heel Toe Raises with Counter Support  - 2 x daily - 7 x weekly - 2 sets - 10 reps - Seated Dorsiflexion Stretch  -  2 x daily - 7 x weekly - 2 sets - 10 reps - 5 hold  ASSESSMENT:  CLINICAL IMPRESSION: Session limited due to patient late arrival. Began session with heel strengthening. Progressed with single leg heel raises this date. Patient able to complete with no inc discomfort. Followed with ROM activities using [employer]. Pt with some difficulty, requiring PT to guard at knee to prevent excessive knee motion. Ended with some ankle and general LE strengthening. Patient tolerated all well with reports of no increase in pain. Patient would continue to benefit from skilled physical therapy for increased endurance with ambulation, increased LE strength/ROM, and improved balance for improved quality of life, improved independence with gait training and continued progress towards therapy goals.       Eval: Patient is a 83 y.o. female who was seen today for physical therapy evaluation and treatment for  M76.61,M76.62 (ICD-10-CM) - Achilles tendinitis of both lower extremities  . On this date, patient demonstrates impaired self perception of function, decreased ankle ROM, decreased strength and decreased endurance, all of which may be contributing to patients altered gait pattern, increased pain, and difficulty with functional transfers. Educated patient on proper shoe wear as she is wearing very flat shoes. Explained how shoes with better arch support may help to limit her discomfort in her heels. Plan to give p print off examples in  next sessions. Time limited during evaluation due to late arrival. Patient will benefit from skilled physical therapy in order to address current deficits to improve gait mechanics, balance, pain, and overall function to improved quality of life.   OBJECTIVE IMPAIRMENTS: Abnormal gait, cardiopulmonary status limiting activity, decreased activity tolerance, decreased balance, decreased endurance, decreased mobility, difficulty walking, decreased ROM, decreased strength, dizziness, impaired flexibility, improper body mechanics, postural dysfunction, and pain.   ACTIVITY LIMITATIONS: lifting, bending, sitting, standing, squatting, stairs, and transfers  PARTICIPATION LIMITATIONS: meal prep, cleaning, laundry, and community activity  PERSONAL FACTORS: N/A are also affecting patient's functional outcome.   REHAB POTENTIAL: Good  CLINICAL DECISION MAKING: Stable/uncomplicated  EVALUATION COMPLEXITY: Low   GOALS: Goals reviewed with patient? No  SHORT TERM GOALS: Target date: 06/25/24 Patient will be independent with performance of HEP to demonstrate adequate self management of symptoms.  Baseline: I have not done my exercises since I got sick Goal status: PARTIALLY MET   2.   Patient will report at least a 25% improvement with function and/or pain reduction overall since beginning PT. Baseline: 70-80% improvement Goal status: MET   LONG TERM GOALS: Target date: 07/13/24 Patient will improve LEFS score by 9 points to demonstrate improved perceived function while meeting MCID.  Baseline: 06/11/24: 36/80 (10 point improvement)  Goal status: MET 2.  Patient will improve  ankle DF  ROM bilaterally to at least 10 degrees to demonstrate improved LE mobility needed for functional transfers and gait mechanics.  Baseline: 06/11/24: L: 8 degrees R: 8 degrees  Goal status: ON GOING  3.  Patient will perform TUG in 12 seconds or less to demonstrate decreased risk of falls.  Baseline: 16.41 seconds  on 06/11/24 Goal status: ON GOING   4.  Patient will improve 30 second chair stand test by at least 2 STS in order to demonstrate improved LE strength/power needed for community ambulation.  Baseline: 11 reps on 06/11/24 (improved by 1 rep)  Goal status: ON GOING   5. Patient will report daily pain at 4/10 or less with conclusion of her daily activities to demonstrate Improved activity tolerance  and pain reduction.  Baseline: 4-5/10 on 06/11/24 Goal status: NEARLY MET    PLAN:  PT FREQUENCY: 2x/week  PT DURATION: 6 weeks  PLANNED INTERVENTIONS: 97164- PT Re-evaluation, 97110-Therapeutic exercises, 97530- Therapeutic activity, 97112- Neuromuscular re-education, 97535- Self Care, 02859- Manual therapy, [id number]- Gait training, [phone]- Electrical stimulation (manual), [id number]- Ultrasound, 02987- Traction (mechanical), [phone] (1-2 muscles), 20561 (3+ muscles)- Dry Needling, Patient/Family education, Balance training, Stair training, Taping, Joint mobilization, Spinal mobilization, Cryotherapy, and Moist heat  PLAN FOR NEXT SESSION: F/U on appropriate shoes with good arch support PRN.  Prog ROM and strengthening activities.    5:40 PM, 06/21/2024 Rosaria Settler, PT, DPT [facility] [place]

## 2024-06-26 ENCOUNTER — Ambulatory Visit (HOSPITAL_COMMUNITY)

## 2024-06-28 ENCOUNTER — Telehealth: Payer: Self-pay

## 2024-06-28 ENCOUNTER — Encounter (INDEPENDENT_AMBULATORY_CARE_PROVIDER_SITE_OTHER): Payer: Self-pay | Admitting: *Deleted

## 2024-06-28 NOTE — Progress Notes (Signed)
° °  06/28/2024  Patient ID: Peggy French, female   DOB: [birth year], 83 y.o.   MRN: [id number]  Contacted patient regarding referral for medication access from Leigh Lung, MD .   Patient was outreached regarding medication assistance for 2026. Verified address, anticipated insurance for 2026, and income. Patient is  interested in PAP for 2026 for Jardiance 25 mg , no other new medications were identified for medication assistance.    Medication Assistance Findings:  Medication assistance needs identified: Jardiance and Tradjenta     Additional medication assistance options reviewed with patient as warranted:  No other options identified  Plan: I will coordinate patient assistance program application process for medications listed above.  I will collaborate with both patient and provider(s) to obtain all required documents from and submit application(s) once completed.    Thank you for allowing pharmacy to be a part of this patients care.   Heather Factor, PharmD Clinical Pharmacist  [phone]

## 2024-06-29 ENCOUNTER — Encounter (HOSPITAL_COMMUNITY): Payer: Self-pay

## 2024-06-29 ENCOUNTER — Ambulatory Visit (HOSPITAL_COMMUNITY)

## 2024-06-29 DIAGNOSIS — M79671 Pain in right foot: Secondary | ICD-10-CM

## 2024-06-29 DIAGNOSIS — Z7409 Other reduced mobility: Secondary | ICD-10-CM

## 2024-06-29 DIAGNOSIS — M25671 Stiffness of right ankle, not elsewhere classified: Secondary | ICD-10-CM

## 2024-06-29 NOTE — Therapy (Signed)
 " OUTPATIENT PHYSICAL THERAPY LOWER EXTREMITY TREATMENT   Patient Name: ALEXZANDRA BILTON MRN: [id number] DOB:[birth year], 83 y.o., female Today's Date: 06/29/2024  END OF SESSION:  PT End of Session - 06/29/24 1412     Visit Number 10    Number of Visits 13    Date for Recertification  07/12/24    Authorization Type BCBS Medicare    Authorization Time Period [employer] approved 13 visits from 04/26/2024-10/22/2024    Authorization - Visit Number 10    Authorization - Number of Visits 13    Progress Note Due on Visit 13   Progress note complete visit #7   PT Start Time 1412    PT Stop Time 1455    PT Time Calculation (min) 43 min    Equipment Utilized During Treatment --   // bars intermittent HHA   Activity Tolerance Patient tolerated treatment well    Behavior During Therapy WFL for tasks assessed/performed               Past Medical History:  Diagnosis Date   Asthmatic bronchitis 2011   Colon carcinoma metastatic to 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   a. s/p right hemicolectomy/chemo 2013 with mets to the lung s/p LLL wedge resection 2014.   Diabetes mellitus    x over 10 yrs   GERD (gastroesophageal reflux disease)    Gout    H/O hiatal hernia    History of colon cancer    History of shingles    Hyperlipidemia    Hypertension    EKG 10/12 EPIC, chest- 1 view  6/13 EPIC    Last 2D Echo on 05/02/2012 showed EF of greater than 55%   Microcytic anemia    Mild CAD    a. cath by Dr. Mona in 03/2011 - 20-30% ostial bifurcation stenosis of LAD, otherwise only mild luminal irregularities in LAD/RCA, LVEF 60%, no RWMA.   c. 06/18/16 LHC after abnormal nuc showed mild non obst CAD   Neuropathy    Obesity    Past Surgical History:  Procedure Laterality Date   ABDOMINAL HYSTERECTOMY     APPENDECTOMY     CARDIAC CATHETERIZATION  2001,2012   CARDIAC CATHETERIZATION N/A 06/18/2016   Procedure: Left Heart Cath and Coronary Angiography;  Surgeon: Lonni Hanson, MD;  Location: [facility] INVASIVE CV  LAB;  Service: Cardiovascular;  Laterality: N/A;   CATARACT EXTRACTION W/PHACO Left 02/17/2015   Procedure: CATARACT EXTRACTION PHACO AND INTRAOCULAR LENS PLACEMENT (IOC);  Surgeon: Cherene Mania, MD;  Location: [facility];  Service: Ophthalmology;  Laterality: Left;  CDE:8.01    CATARACT EXTRACTION W/PHACO Right 08/03/2021   Procedure: CATARACT EXTRACTION PHACO AND INTRAOCULAR LENS PLACEMENT (IOC);  Surgeon: Harrie Agent, MD;  Location: [facility];  Service: Ophthalmology;  Laterality: Right;  CDE: 13.97   CHOLECYSTECTOMY     COLON SURGERY     PARTIAL COLECTOMY 30 YRS / AGAIN 2013   COLONOSCOPY  11/26/2011   Procedure: COLONOSCOPY;  Surgeon: Claudis RAYMOND Rivet, MD;  Location: [facility];  Service: Endoscopy;  Laterality: N/A;  2:15   COLONOSCOPY N/A 12/08/2012   Procedure: COLONOSCOPY;  Surgeon: Claudis RAYMOND Rivet, MD;  Location: [facility];  Service: Endoscopy;  Laterality: N/A;  815-moved to 0950 Ann to notify pt   COLONOSCOPY N/A 03/25/2016   Procedure: COLONOSCOPY;  Surgeon: Claudis RAYMOND Rivet, MD;  Location: [facility];  Service: Endoscopy;  Laterality: N/A;  1200   COLONOSCOPY WITH PROPOFOL  N/A 07/29/2021   Procedure: COLONOSCOPY WITH PROPOFOL ;  Surgeon: Golda Claudis PENNER, MD;  Location: [facility];  Service: Endoscopy;  Laterality: N/A;  11:10   ESOPHAGOGASTRODUODENOSCOPY  06/16/2011   Procedure: ESOPHAGOGASTRODUODENOSCOPY (EGD);  Surgeon: Claudis PENNER Golda, MD;  Location: [facility];  Service: Endoscopy;  Laterality: N/A;  2:15   POLYPECTOMY  07/29/2021   Procedure: POLYPECTOMY;  Surgeon: Golda Claudis PENNER, MD;  Location: [facility];  Service: Endoscopy;;  transverse colon   PORT-A-CATH REMOVAL N/A 06/04/2014   Procedure: REMOVAL PORT-A-CATH;  Surgeon: Donnice KATHEE Lunger, MD;  Location: [facility];  Service: General;  Laterality: N/A;   PORTACATH PLACEMENT  02/22/2012   Procedure: INSERTION PORT-A-CATH;  Surgeon: Donnice KATHEE Lunger, MD;  Location: [facility];  Service: General;  Laterality: N/A;  left  subclavian   VIDEO ASSISTED THORACOSCOPY (VATS)/WEDGE RESECTION Left 03/27/2013   Procedure: VIDEO ASSISTED THORACOSCOPY (VATS)/WEDGE RESECTION;  Surgeon: Dallas KATHEE Jude, MD;  Location: [facility] OR;  Service: Thoracic;  Laterality: Left;   VIDEO BRONCHOSCOPY N/A 03/27/2013   Procedure: VIDEO BRONCHOSCOPY;  Surgeon: Dallas KATHEE Jude, MD;  Location: [facility] OR;  Service: Thoracic;  Laterality: N/A;   Patient Active Problem List   Diagnosis Date Noted   PAD (peripheral artery disease) 11/15/2023   GERD (gastroesophageal reflux disease) 09/29/2023   Pain due to onychomycosis of toenails of both feet 04/28/2022   Chest pain 09/29/2020   Moderate risk chest pain 06/24/2016   Mixed hyperlipidemia 06/24/2016   Unstable angina (HCC) 06/19/2016   Mild CAD    Chest pain with moderate risk of acute coronary syndrome 06/17/2015   Type 2 diabetes mellitus with obesity 06/17/2015   Obesity (BMI 30-39.9)    Colon carcinoma metastatic to 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   S/P right colectomy 01/07/2012   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  Feels her balance is off, no reports of recent fall.   Eval: Pt reports pain has been going on a while. Reports they told her she had bone spurs on both sides. Reports her ankle pain flares up every now and then. Reports she feels burning in calves sometimes.  PERTINENT HISTORY: 83 y.o. female presents with the above  complaint. History confirmed with patient.  Her diabetes is well-controlled nails are thickened elongated and causing discomfort.  Previous debridement was helpful.  Blood sugar remains well-controlled.  Her Achilles tendon pain has returned and is worse in the back of both heels.  Physical Exam: warm, good capillary refill, no trophic changes or ulcerative lesions, normal DP and PT pulses, and abnormal sensory exam.  Pain to palpation of the insertion of the posterior Achilles bilateral Left Foot: dystrophic yellowed discolored nail plates with subungual debris Right Foot: dystrophic yellowed discolored nail plates with subungual debris.   Pt report history of vertigo DM Gout Neuropathy Cardiac issues  PAIN:  Are you having pain? Yes: NPRS scale: 5-6/10 Pain location: back of heels, gastrocs Pain description: Burning pain  Aggravating factors: Sitting too long Relieving  factors: raising feet on bed   PRECAUTIONS: None  RED FLAGS: None Pt reports neuropathy in feet bilaterally due to diabetes, and cramping in hands bilaterally, B/B issues due to previous colon cancer  WEIGHT BEARING RESTRICTIONS: No  FALLS:  Has patient fallen in last 6 months? No, reports 2 near misses just today  LIVING ENVIRONMENT: Lives with: lives with their son, caregiver for son who has dementia Lives in: House/apartment Stairs: No has ramp, one step into laundry area. Has following equipment at home: Single point cane  OCCUPATION: N/A  PLOF: Independent but gets Meals on Wheels  PATIENT GOALS:  To get back to where I can help myself and help my son, being more active, go to church   NEXT MD VISIT: January 2026  OBJECTIVE:  Note: Objective measures were completed at Evaluation unless otherwise noted.  DIAGNOSTIC FINDINGS: N/A  PATIENT SURVEYS:  LEFS  Extreme difficulty/unable (0), Quite a bit of difficulty (1), Moderate difficulty (2), Little difficulty (3), No difficulty (4) Survey date:     Any of your usual work, housework or school activities   2. Usual hobbies, recreational or sporting activities   3. Getting into/out of the bath   4. Walking between rooms   5. Putting on socks/shoes   6. Squatting    7. Lifting an object, like a bag of groceries from the floor   8. Performing light activities around your home   9. Performing heavy activities around your home   10. Getting into/out of a car   11. Walking 2 blocks   12. Walking 1 mile   13. Going up/down 10 stairs (1 flight)   14. Standing for 1 hour   15.  sitting for 1 hour   16. Running on even ground   17. Running on uneven ground   18. Making sharp turns while running fast   19. Hopping    20. Rolling over in bed   Score total:  Lower Extremity Functional Score: 26 / 80 = 32.5 %      COGNITION: Overall cognitive status: Within functional limits for tasks assessed     SENSATION: WFL  POSTURE: rounded shoulders and forward head   LOWER EXTREMITY ROM:  Active ROM Right eval Left eval  Hip flexion    Hip extension    Hip abduction    Hip adduction    Hip internal rotation    Hip external rotation    Knee flexion    Knee extension    Ankle dorsiflexion 3 7  Ankle plantarflexion 40 42  Ankle inversion    Ankle eversion     (Blank rows = not tested)  LOWER EXTREMITY MMT:  MMT Right eval Left eval  Hip flexion    Hip extension    Hip abduction    Hip adduction    Hip internal rotation    Hip external rotation    Knee flexion    Knee extension 4 * burning pain 4 * burning pain   Ankle dorsiflexion 5 5  Ankle plantarflexion    Ankle inversion    Ankle eversion     (Blank rows = not tested)  LOWER EXTREMITY SPECIAL TESTS:    FUNCTIONAL TESTS:  30 seconds chair stand test Timed up and go (TUG): Next session 2 minute walk test: next session  30 second chair stand: 10 STS, slight burning in heels GAIT: Distance walked: 100 ft in session  Assistive device utilized: Single  point cane Level of  assistance: Modified independence Comments: SPC in R hand, dec velocity, WBOS, dec hip flex/ext bilat, dec stance time on LLE, dec IC/DF and push off/PF moments bilat                                                                                                                                TREATMENT DATE:  06/29/24: - Nustep L4 x 5 minutes, average SPM 53 - Heel raises 2 up and 1 down 10x each (cueing for posture to reduce forward lean, decreased range with SLS) - Rockerboard 2' R/L then Df/Pf - Dorsiflexion marches from 6 inch step, 3 sets of 5 reps bilaterally, in parallel bars, 5lb kettle bell, pt cued for max hip and ankle ROM - SLS Rt 2-3, Lt 4-6 average max of 5 - Vector stance 3x 5 - Sidestep with RTB around thigh 2RT    06/21/24: Heel raises on incline at // bars, 10  Prog to SL heel raises, 10x each side Toe raises on decline, 2x10 Fitter Board, DF/PF, 1'  Sup/pronation, 1' each foot one at a time  Circles, 1' each foot one at a time Standing marching, resistance at forefoot, GTB, verbal cues to maintain neutral DF, 2' Stationary bike, 3 minutes, level 3 resistance, seat 8   06/13/2024  Therapeutic Exercise: -Stationary bike, 5 minutes, level 4>2 resistance, pt cued for above 45 spm but has to drop to mid 30s for a while -Rocker board all planes, 20 in each dircetion, pt cued for BUE support for safety Neuromuscular Re-education: -Dorsiflexion marches from 6 inch step, 3 sets of 5 reps bilaterally, in parallel bars, 5lb kettle bell, pt cued for max hip and ankle ROM -Heel raises, 1 sets of 20 reps, pt cued for decreased UE support for increased balance challenge -Aeromat walks, tandem and lateral stepping, 1 lap each variation, pt cued for decreased UE support                                     06/11/24 EXERCISE LOG  Exercise Repetitions and Resistance Comments  Nustep  L4 x 8 minutes    Goal assessment for progress report   See below                  Blank cell = exercise not performed today                             PATIENT EDUCATION:  Education details: HEP and plan for next appointment Person educated: Patient Education method: Explanation Education comprehension: verbalized understanding  HOME EXERCISE PROGRAM: Access Code: [email or web address] URL: [email or web address] Date: 05/01/2024 Prepared by: Lacinda Fass  Exercises - Sit to Stand  - 1 x daily - 7 x weekly - 3 sets - 10 reps - Seated Heel Toe Raises  - 1 x  daily - 7 x weekly - 3 sets - 10 reps - Seated Gastroc Stretch with Strap  - 1 x daily - 7 x weekly - 3-4 sets - 30 seconds hold  Access Code: [email or web address] URL: [email or web address] Date: 04/26/2024 Prepared by: Rosaria Powell-Butler  Exercises - Heel Raises with Counter Support  - 2 x daily - 7 x weekly - 2 sets - 10 reps - Heel Toe Raises with Counter Support  - 2 x daily - 7 x weekly - 2 sets - 10 reps - Seated Dorsiflexion Stretch  - 2 x daily - 7 x weekly - 2 sets - 10 reps - 5 hold  ASSESSMENT:  CLINICAL IMPRESSION: Session focus with ankle strengthening and progressed balance this session.  Presents with gastroc weakness with decreased range with SLS heel raise and cueing for posture as tendency to lean forward.  Pt with difficulty with SLS, added vector stance for hip stability to assist with balance with intermittent HHA required and cueing for form.  Pt tolerated well to session with no reports of increased pain, was limited by fatigue with a couple seated rest breaks required.  Reviewed importance of working on balance at home in safe locations with verbalized understanding.    Eval: Patient is a 83 y.o. female who was seen today for physical therapy evaluation and treatment for  M76.61,M76.62 (ICD-10-CM) - Achilles tendinitis of both lower extremities  . On this date, patient demonstrates impaired self perception of function, decreased ankle ROM, decreased strength and  decreased endurance, all of which may be contributing to patients altered gait pattern, increased pain, and difficulty with functional transfers. Educated patient on proper shoe wear as she is wearing very flat shoes. Explained how shoes with better arch support may help to limit her discomfort in her heels. Plan to give p print off examples in next sessions. Time limited during evaluation due to late arrival. Patient will benefit from skilled physical therapy in order to address current deficits to improve gait mechanics, balance, pain, and overall function to improved quality of life.   OBJECTIVE IMPAIRMENTS: Abnormal gait, cardiopulmonary status limiting activity, decreased activity tolerance, decreased balance, decreased endurance, decreased mobility, difficulty walking, decreased ROM, decreased strength, dizziness, impaired flexibility, improper body mechanics, postural dysfunction, and pain.   ACTIVITY LIMITATIONS: lifting, bending, sitting, standing, squatting, stairs, and transfers  PARTICIPATION LIMITATIONS: meal prep, cleaning, laundry, and community activity  PERSONAL FACTORS: N/A are also affecting patient's functional outcome.   REHAB POTENTIAL: Good  CLINICAL DECISION MAKING: Stable/uncomplicated  EVALUATION COMPLEXITY: Low   GOALS: Goals reviewed with patient? No  SHORT TERM GOALS: Target date: 06/25/24 Patient will be independent with performance of HEP to demonstrate adequate self management of symptoms.  Baseline: I have not done my exercises since I got sick Goal status: PARTIALLY MET   2.   Patient will report at least a 25% improvement with function and/or pain reduction overall since beginning PT. Baseline: 70-80% improvement Goal status: MET   LONG TERM GOALS: Target date: 07/13/24 Patient will improve LEFS score by 9 points to demonstrate improved perceived function while meeting MCID.  Baseline: 06/11/24: 36/80 (10 point improvement)  Goal status: MET 2.   Patient will improve  ankle DF  ROM bilaterally to at least 10 degrees to demonstrate improved LE mobility needed for functional transfers and gait mechanics.  Baseline: 06/11/24: L: 8 degrees R: 8 degrees  Goal status: ON GOING  3.  Patient will perform TUG in 12 seconds or less to  demonstrate decreased risk of falls.  Baseline: 16.41 seconds on 06/11/24 Goal status: ON GOING   4.  Patient will improve 30 second chair stand test by at least 2 STS in order to demonstrate improved LE strength/power needed for community ambulation.  Baseline: 11 reps on 06/11/24 (improved by 1 rep)  Goal status: ON GOING   5. Patient will report daily pain at 4/10 or less with conclusion of her daily activities to demonstrate Improved activity tolerance and pain reduction.  Baseline: 4-5/10 on 06/11/24 Goal status: NEARLY MET    PLAN:  PT FREQUENCY: 2x/week  PT DURATION: 6 weeks  PLANNED INTERVENTIONS: 97164- PT Re-evaluation, 97110-Therapeutic exercises, 97530- Therapeutic activity, 97112- Neuromuscular re-education, 97535- Self Care, 02859- Manual therapy, [id number]- Gait training, [phone]- Electrical stimulation (manual), [id number]- Ultrasound, 02987- Traction (mechanical), [phone] (1-2 muscles), 20561 (3+ muscles)- Dry Needling, Patient/Family education, Balance training, Stair training, Taping, Joint mobilization, Spinal mobilization, Cryotherapy, and Moist heat  PLAN FOR NEXT SESSION: F/U on appropriate shoes with good arch support PRN.  Prog ROM and strengthening activities.   Augustin Mclean, LPTA/CLT; CBIS [phone] 3:00 PM, 06/29/2024   "

## 2024-07-02 ENCOUNTER — Ambulatory Visit (HOSPITAL_COMMUNITY): Admitting: Physical Therapy

## 2024-07-08 ENCOUNTER — Emergency Department (HOSPITAL_COMMUNITY)

## 2024-07-08 ENCOUNTER — Other Ambulatory Visit: Payer: Self-pay

## 2024-07-08 ENCOUNTER — Encounter (HOSPITAL_COMMUNITY): Payer: Self-pay | Admitting: Emergency Medicine

## 2024-07-08 ENCOUNTER — Emergency Department (HOSPITAL_COMMUNITY)
Admission: EM | Admit: 2024-07-08 | Discharge: 2024-07-08 | Disposition: A | Discharge status: Home / Self Care | Attending: Emergency Medicine | Admitting: Emergency Medicine

## 2024-07-08 DIAGNOSIS — Z7982 Long term (current) use of aspirin: Secondary | ICD-10-CM | POA: Diagnosis not present

## 2024-07-08 DIAGNOSIS — I2510 Atherosclerotic heart disease of native coronary artery without angina pectoris: Secondary | ICD-10-CM | POA: Insufficient documentation

## 2024-07-08 DIAGNOSIS — R42 Dizziness and giddiness: Secondary | ICD-10-CM | POA: Insufficient documentation

## 2024-07-08 DIAGNOSIS — Y92002 Bathroom of unspecified non-institutional (private) residence single-family (private) house as the place of occurrence of the external cause: Secondary | ICD-10-CM | POA: Insufficient documentation

## 2024-07-08 DIAGNOSIS — I10 Essential (primary) hypertension: Secondary | ICD-10-CM | POA: Diagnosis not present

## 2024-07-08 DIAGNOSIS — S0990XA Unspecified injury of head, initial encounter: Secondary | ICD-10-CM | POA: Insufficient documentation

## 2024-07-08 DIAGNOSIS — W010XXA Fall on same level from slipping, tripping and stumbling without subsequent striking against object, initial encounter: Secondary | ICD-10-CM | POA: Diagnosis not present

## 2024-07-08 DIAGNOSIS — E119 Type 2 diabetes mellitus without complications: Secondary | ICD-10-CM | POA: Diagnosis not present

## 2024-07-08 DIAGNOSIS — W19XXXA Unspecified fall, initial encounter: Secondary | ICD-10-CM

## 2024-07-08 LAB — CBC WITH DIFFERENTIAL/PLATELET
Abs Immature Granulocytes: 0.04 K/uL (ref 0.00–0.07)
Basophils Absolute: 0 K/uL (ref 0.0–0.1)
Basophils Relative: 0 %
Eosinophils Absolute: 0.1 K/uL (ref 0.0–0.5)
Eosinophils Relative: 2 %
HCT: 42.6 % (ref 36.0–46.0)
Hemoglobin: 13.6 g/dL (ref 12.0–15.0)
Immature Granulocytes: 1 %
Lymphocytes Relative: 9 %
Lymphs Abs: 0.5 K/uL — ABNORMAL LOW (ref 0.7–4.0)
MCH: 27.1 pg (ref 26.0–34.0)
MCHC: 31.9 g/dL (ref 30.0–36.0)
MCV: 84.9 fL (ref 80.0–100.0)
Monocytes Absolute: 0.5 K/uL (ref 0.1–1.0)
Monocytes Relative: 10 %
Neutro Abs: 4.1 K/uL (ref 1.7–7.7)
Neutrophils Relative %: 78 %
Platelets: 175 K/uL (ref 150–400)
RBC: 5.02 MIL/uL (ref 3.87–5.11)
RDW: 13.6 % (ref 11.5–15.5)
WBC: 5.3 K/uL (ref 4.0–10.5)
nRBC: 0 % (ref 0.0–0.2)

## 2024-07-08 LAB — BASIC METABOLIC PANEL WITH GFR
Anion gap: 9 (ref 5–15)
BUN: 13 mg/dL (ref 8–23)
CO2: 29 mmol/L (ref 22–32)
Calcium: 9.6 mg/dL (ref 8.9–10.3)
Chloride: 99 mmol/L (ref 98–111)
Creatinine, Ser: 1.16 mg/dL — ABNORMAL HIGH (ref 0.44–1.00)
GFR, Estimated: 47 mL/min — ABNORMAL LOW
Glucose, Bld: 205 mg/dL — ABNORMAL HIGH (ref 70–99)
Potassium: 4.1 mmol/L (ref 3.5–5.1)
Sodium: 138 mmol/L (ref 135–145)

## 2024-07-08 LAB — CK: Total CK: 169 U/L (ref 38–234)

## 2024-07-08 MED ORDER — MECLIZINE HCL 12.5 MG PO TABS: 12.5000 mg | ORAL_TABLET | Freq: Once | ORAL | Status: DC

## 2024-07-08 MED ORDER — LACTATED RINGERS IV BOLUS
1000.0000 mL | Freq: Once | INTRAVENOUS | Status: AC
  Administered 2024-07-08: 1000 mL via INTRAVENOUS

## 2024-07-08 MED ORDER — MECLIZINE HCL 12.5 MG PO TABS
12.5000 mg | ORAL_TABLET | Freq: Once | ORAL | Status: AC
  Administered 2024-07-08: 12.5 mg via ORAL
  Filled 2024-07-08: qty 1

## 2024-07-08 NOTE — Discharge Instructions (Addendum)
 You were seen for your fall in the emergency department.   At home, please continue taking your meclizine  for your vertigo.    Check your MyChart online for the results of any tests that had not resulted by the time you left the emergency department.   Follow-up with your primary doctor in 2-3 days regarding your visit.  Follow-up with your ear nose and throat doctors about your dizziness.  I have placed an order for physical therapy to come to your house as well.  Return immediately to the emergency department if you experience any of the following: Severe headache, worsening dizziness, or any other concerning symptoms.    Thank you for visiting our Emergency Department. It was a pleasure taking care of you today.

## 2024-07-08 NOTE — ED Provider Notes (Signed)
 " [place] EMERGENCY DEPARTMENT AT [facility] Provider Note   CSN: [id number] Arrival date & time: 07/08/24  9366     Patient presents with: Peggy French is a 83 y.o. female.   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  Patient reports last night she was getting up to go to the restroom and fell onto her bottom.  Paramedics came after approximately 15 minutes and help her get up for.  Declined transport at that point in time.  Was in the bathroom this morning and was feeling dizzy from her usual vertigo and did stumble and hit her head on the shower.  Says she has felt dizzy for over a month from her vertigo. No LOC.  Not on blood thinners.  Says that she took some meclizine  last night.  Not currently feeling dizzy when she is sitting still  Son later came to the bedside and reports that the patient has been having ongoing vertigo for several months       Prior to Admission medications  Medication Sig Start Date End Date Taking? Authorizing Provider  acetaminophen  (TYLENOL ) 500 MG tablet Take 1,000 mg by mouth every 6 (six) hours as needed for mild pain or moderate pain. For pain    [provider]  allopurinol  (ZYLOPRIM ) 100 MG tablet Take 100 mg by mouth daily. 04/13/19   [provider]  aspirin  EC 81 MG tablet Take 1 tablet (81 mg total) by mouth daily. 07/30/21   Rehman, Claudis PENNER, MD  Blood Glucose Monitoring Suppl (ONETOUCH VERIO REFLECT) w/Device KIT USE TO CHECK BLOOD GLUCOSE 3 TIMES DAILY BEFORE MEALS 08/13/21   [provider]  carvedilol  (COREG ) 3.125 MG tablet Take 1 tablet (3.125 mg total) by mouth 2 (two) times daily. 11/30/23   West, Katlyn D, NP  Cyanocobalamin (B-12 PO) Place 1,000 mcg under the tongue daily.    [provider]  diphenoxylate -atropine  (LOMOTIL ) 2.5-0.025 MG tablet Take 1 tablet by mouth 4  (four) times daily as needed for diarrhea or loose stools.    [provider]  esomeprazole  (NEXIUM ) 40 MG capsule Take 40 mg by mouth every evening. 07/16/11   Rehman, Claudis PENNER, MD  fexofenadine (ALLEGRA) 180 MG tablet Take 180 mg by mouth daily as needed for allergies.    [provider]  furosemide  (LASIX ) 20 MG tablet Take 20 mg by mouth daily. 11/07/23   [provider]  gabapentin  (NEURONTIN ) 100 MG capsule Take 100 mg by mouth 3 (three) times daily.    [provider]  isosorbide  mononitrate (IMDUR ) 60 MG 24 hr tablet Take 1 tablet (60 mg total) by mouth 2 (two) times daily. 11/30/23   West, Katlyn D, NP  linagliptin  (TRADJENTA ) 5 MG TABS tablet Take 5 mg by mouth daily after breakfast.    [provider]  meclizine  (ANTIVERT ) 25 MG tablet Take 25 mg by mouth 2 (two) times daily. 07/30/20   [provider]  MITIGARE  0.6 MG CAPS Take 1 capsule by mouth daily. 04/13/23   [provider]  Multiple Vitamins-Minerals (WOMENS MULTIVITAMIN PO) Take 1 tablet by mouth daily.    [provider]  nitroGLYCERIN  (NITROSTAT ) 0.4 MG SL tablet Place 1 tablet (0.4 mg total) under the tongue every 5 (five) minutes as needed for chest pain. 11/30/23 03/26/24  West, Katlyn D, NP  potassium chloride  SA (KLOR-CON  M20) 20 MEQ tablet  Take 1 tablet (20 mEq total) by mouth daily. 11/22/16   Debby Olam POUR, NP  ramipril  (ALTACE ) 5 MG capsule Take 5 mg by mouth every evening.    [provider]  rosuvastatin  (CRESTOR ) 20 MG tablet Take 1 tablet (20 mg total) by mouth daily. 11/30/23   West, Katlyn D, NP  traZODone (DESYREL) 50 MG tablet Take 50 mg by mouth at bedtime. 09/05/23   [provider]    Allergies: Aspirin     Review of Systems  Updated Vital Signs BP (!) 192/114   Pulse (!) 103   Temp 98.4 F (36.9 C) (Oral)   Resp (!) 22   Ht 5' 4 (1.626 m)   Wt 95.3 kg   SpO2 97%   BMI 36.05 kg/m   Physical Exam Constitutional:       General: She is not in acute distress.    Appearance: Normal appearance. She is not ill-appearing.  HENT:     Head: Normocephalic and atraumatic.     Right Ear: External ear normal.     Left Ear: External ear normal.     Mouth/Throat:     Mouth: Mucous membranes are moist.     Pharynx: Oropharynx is clear.  Eyes:     Extraocular Movements: Extraocular movements intact.     Conjunctiva/sclera: Conjunctivae normal.     Pupils: Pupils are equal, round, and reactive to light.  Neck:     Comments: No C-spine midline tenderness to palpation Cardiovascular:     Rate and Rhythm: Normal rate and regular rhythm.     Pulses: Normal pulses.     Heart sounds: Murmur heard.  Pulmonary:     Effort: Pulmonary effort is normal. No respiratory distress.     Breath sounds: Normal breath sounds.  Abdominal:     General: Abdomen is flat. There is no distension.     Palpations: Abdomen is soft. There is no mass.     Tenderness: There is no abdominal tenderness. There is no guarding.  Musculoskeletal:        General: No deformity. Normal range of motion.     Cervical back: No rigidity or tenderness.     Comments: No tenderness to palpation of midline thoracic or lumbar spine.  No step-offs palpated.  No tenderness to palpation of chest wall.  No bruising noted.  No tenderness to palpation of bilateral clavicles.  No tenderness to palpation, bruising, or deformities noted of bilateral shoulders, elbows, wrists, hips, knees, or ankles.  Neurological:     General: No focal deficit present.     Mental Status: She is alert and oriented to person, place, and time. Mental status is at baseline.     Cranial Nerves: No cranial nerve deficit.     Sensory: No sensory deficit.     Motor: No weakness.     Comments: No nystagmus.  Normal finger-nose and heel-to-shin.  Negative Dix-Hallpike bilaterally     (all labs ordered are listed, but only abnormal results are displayed) Labs Reviewed  CBC WITH  DIFFERENTIAL/PLATELET - Abnormal; Notable for the following components:      Result Value   Lymphs Abs 0.5 (*)    All other components within normal limits  BASIC METABOLIC PANEL WITH GFR - Abnormal; Notable for the following components:   Glucose, Bld 205 (*)    Creatinine, Ser 1.16 (*)    GFR, Estimated 47 (*)    All other components within normal limits  CK  EKG: EKG Interpretation Date/Time:  Sunday July 08 2024 07:48:00 EST Ventricular Rate:  106 PR Interval:  181 QRS Duration:  87 QT Interval:  341 QTC Calculation: 453 R Axis:   28  Text Interpretation: Sinus tachycardia Low voltage, precordial leads Borderline T wave abnormalities Confirmed by Yolande Charleston [phone]) on 07/08/2024 7:54:52 AM  Radiology: DG Chest Portable 1 View Result Date: 07/08/2024 EXAM: 1 VIEW(S) XRAY OF THE CHEST 07/08/2024 07:29:33 AM COMPARISON: CTA chest 09/28/2023 and earlier. CLINICAL HISTORY: 83 year old female with a fall overnight in the bathroom, striking the back of her head. FINDINGS: LUNGS AND PLEURA: Stable chronic pulmonary interstitial changes. No acute opacity. No pleural effusion. No pneumothorax. HEART AND MEDIASTINUM: Calcified aorta. No acute abnormality of the cardiac silhouette. BONES AND SOFT TISSUES: Thoracic degenerative changes. No acute osseous abnormality. IMPRESSION: 1. No acute cardiopulmonary abnormality. Electronically signed by: Helayne Hurst MD 07/08/2024 07:46 AM EST RP Workstation: [id number]   DG Pelvis Portable Result Date: 07/08/2024 EXAM: 2 VIEW(S) XRAY OF THE PELVIS 07/08/2024 07:29:33 AM COMPARISON: CT abdomen and pelvis 01/19/2021. CLINICAL HISTORY: 83 year old female. Fall over night in bathroom striking back of head. FINDINGS: BONES AND JOINTS: Degenerative changes of the visualized lower lumbar spine. Degenerative changes of bilateral sacroiliac joints. No acute fracture. No malalignment. SOFT TISSUES: The soft tissues are unremarkable. IMPRESSION: 1. No  acute fracture or dislocation identified about the pelvis. Electronically signed by: Helayne Hurst MD 07/08/2024 07:44 AM EST RP Workstation: [id number]   CT Cervical Spine Wo Contrast Result Date: 07/08/2024 EXAM: CT CERVICAL SPINE WITHOUT CONTRAST 07/08/2024 07:22:46 AM TECHNIQUE: CT of the cervical spine was performed without the administration of intravenous contrast. Multiplanar reformatted images are provided for review. Automated exposure control, iterative reconstruction, and/or weight based adjustment of the mA/kV was utilized to reduce the radiation dose to as low as reasonably achievable. COMPARISON: CT head reported separately today. CLINICAL HISTORY: 83 year old female. Neck trauma (Age >= 65y). Fall overnight in bathroom striking back of head. FINDINGS: BONES AND ALIGNMENT: Normal bone mineralization for age. Hyperostosis throughout the visible spine (DISH). Probable subsequent cervical interbody ankylosis at C6-C7. Straightening of lordosis. Partially visible upper thoracic spinal flowing osteophytes and interbody ankylosis. No acute osseous abnormality. Absent dentition. 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 SOFT TISSUES: No prevertebral soft tissue swelling. Partially retropharyngeal course of the left carotid, normal variant. Bulky calcified right carotid atherosclerosis. Otherwise negative visible non-contrast neck soft tissues. Negative visible non-contrast thoracic inlet. IMPRESSION: 1. No acute traumatic injury identified in the cervical spine. 2. DISH, OPLL. And associated cervical spinal stenosis. Electronically signed by: Helayne Hurst MD 07/08/2024 07:43 AM EST RP Workstation: [id number]   CT Head Wo Contrast Result Date: 07/08/2024 EXAM: CT HEAD WITHOUT CONTRAST 07/08/2024 07:22:46 AM TECHNIQUE: CT of the head  was performed without the administration of intravenous contrast. Automated exposure control, iterative reconstruction, and/or weight based adjustment of the mA/kV was utilized to reduce the radiation dose to as low as reasonably achievable. COMPARISON: Prior head CT 02/03/2014. CLINICAL HISTORY: 83 year old female. Fall overnight in bathroom striking back of head. FINDINGS: BRAIN AND VENTRICLES: Brain volume remains normal for age. No acute hemorrhage. No evidence of acute infarct. No hydrocephalus. No extra-axial collection. No mass effect or midline shift. Patchy mild to moderate for age bilateral periventricular white matter hypodensity has progressed. Otherwise gray white differentiation is maintained. Calcified atherosclerosis at the skull base is  mild for age. No suspicious intracranial vascular hyperdensity. ORBITS: Postoperative changes to both globes since 2015. SINUSES: Hyperplastic paranasal sinuses and visible tympanic cavities and mastoids are well aerated. No layering sinus hemorrhage. SOFT TISSUES AND SKULL: No discrete scalp soft tissue injury. No skull fracture. Hyperostosis of the calvarium, normal variant. IMPRESSION: 1. No acute traumatic injury identified. 2. Progressive cerebral white matter changes since 2015, most commonly due to small vessel disease. Electronically signed by: Helayne Hurst MD 07/08/2024 07:38 AM EST RP Workstation: [id number]     Procedures   Medications Ordered in the ED  meclizine  (ANTIVERT ) tablet 12.5 mg (12.5 mg Oral Given 07/08/24 0741)  meclizine  (ANTIVERT ) tablet 12.5 mg (12.5 mg Oral Given 07/08/24 0951)  lactated ringers  bolus 1,000 mL (0 mLs Intravenous Stopped 07/08/24 1601)    Clinical Course as of 07/08/24 1913  Sun Jul 08, 2024  1117 Dizziness with standing. Negative dix hallpike bilaterally.  [RP]  1157 Orthostatics negative [RP]    Clinical Course User Index [RP] Yolande Lamar BROCKS, MD                                 Medical Decision  Making Amount and/or Complexity of Data Reviewed Labs: ordered. Radiology: ordered.   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  Did hit her head.  Is having some vertigo.  She reports that it is not at rest and only when she stands.  On exam is overall well-appearing.  No obvious signs of trauma.  States that she has had vertigo for several months but does not have any cerebellar signs on exam.  Dix-Hallpike negative bilaterally.  Orthostatics negative.  Head CT and CT findings CT did not show acute abnormality.  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  Also had a chest and pelvis x-ray does not show acute findings.  Lab work was unremarkable.  Upon re-evaluation still feeling dizzy and required several doses of meclizine  and some IV fluids.  Afterwards she was able to walk but still did feel dizzy.  Appears that she is close to her baseline at this point in time.  Low concern for stroke given the negative imaging and duration of her symptoms.  She is however a fall risk so we will have PT go to the house and see her.  Ordered to follow-up with ENT about her vertigo as well  This patient presents to the ED for concern of complaints listed in HPI, this involves an extensive number of treatment options, and is a complaint that carries with it a high risk of complications and morbidity. Disposition including potential need for admission considered.   Dispo: DC Home. Return precautions discussed including, but not limited to, those listed in the AVS. Allowed pt time to ask questions which were answered fully prior to dc.  I have reviewed the patients home medications and made adjustments as needed Additional history  obtained from son Records reviewed Outpatient Clinic Notes The following labs were independently interpreted: Chemistry and show no acute abnormality I independently reviewed the following imaging with scope of interpretation limited to determining acute life threatening conditions related to emergency care: CT Head and agree with the radiologist interpretation with the  following exceptions: none I personally reviewed and interpreted cardiac monitoring: normal sinus rhythm  I personally reviewed and interpreted the pt's EKG: see above for interpretation  Social Determinants of health:  Geriatric  Portions of this note were generated with Scientist, clinical (histocompatibility and immunogenetics). Dictation errors may occur despite best attempts at proofreading.     Final diagnoses:  Fall, initial encounter  Vertigo  Minor head injury, initial encounter    ED Discharge Orders          Ordered    Home Health        07/08/24 [phone]    Face-to-face encounter (required for Medicare/Medicaid patients)       Comments: I Lamar JAYSON Shan certify that this patient is under my care and that I, or a nurse practitioner or physician's assistant working with me, had a face-to-face encounter that meets the physician face-to-face encounter requirements with this patient on 07/08/2024. The encounter with the patient was in whole, or in part for the following medical condition(s) which is the primary reason for home health care (List medical condition): Vertigo and dizziness   07/08/24 1602               Shan Lamar JAYSON, MD 07/08/24 1914  "

## 2024-07-08 NOTE — ED Triage Notes (Signed)
 Pt bib EMS after falling around 0600 this am. EMS reports pt fell last night too and EMS was dispatched to house but that pt refused treatment at that time. With this fall, pt states she was in her bathroom and lost her balance, falling and hitting the back of her head on her shower. Pt c/o pain to back of her head. Denies LOC. States she is not on any blood thinners. CBG 269 per EMS.

## 2024-07-13 ENCOUNTER — Ambulatory Visit: Admission: EM | Admit: 2024-07-13 | Discharge: 2024-07-13 | Disposition: A | Discharge status: Home / Self Care

## 2024-07-13 DIAGNOSIS — R062 Wheezing: Secondary | ICD-10-CM | POA: Diagnosis not present

## 2024-07-13 DIAGNOSIS — J069 Acute upper respiratory infection, unspecified: Secondary | ICD-10-CM | POA: Diagnosis not present

## 2024-07-13 LAB — POCT INFLUENZA A/B
Influenza A, POC: NEGATIVE
Influenza B, POC: NEGATIVE

## 2024-07-13 LAB — POC SOFIA SARS ANTIGEN FIA: SARS Coronavirus 2 Ag: NEGATIVE

## 2024-07-13 MED ORDER — PREDNISONE 20 MG PO TABS: 40.0000 mg | ORAL_TABLET | Freq: Every day | ORAL | 0 refills | Status: AC

## 2024-07-13 MED ORDER — ALBUTEROL SULFATE HFA 108 (90 BASE) MCG/ACT IN AERS: 2.0000 | INHALATION_SPRAY | Freq: Four times a day (QID) | RESPIRATORY_TRACT | 0 refills | Status: AC | PRN

## 2024-07-13 MED ORDER — AZELASTINE HCL 0.1 % NA SOLN: 2.0000 | Freq: Two times a day (BID) | NASAL | 0 refills | Status: AC

## 2024-07-13 NOTE — ED Provider Notes (Signed)
 " [facility]    CSN: [id number] Arrival date & time: 07/13/24  1350      History   Chief Complaint No chief complaint on file.   HPI Peggy French is a 84 y.o. female.   The history is provided by the patient.   Patient presents with a 4-day history of cough, chest congestion, headache, and bodyaches.  She denies fever, chills, ear drainage, shortness of breath, difficulty breathing, chest pain, abdominal pain, nausea, vomiting, diarrhea, or rash.  Patient states she has been taking Tylenol  for her symptoms.  She denies any obvious close sick contacts.  Per review of her chart, she does have underlying history of asthmatic bronchitis.  States that she has been wheezing since her symptoms started.  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 s/p right hemicolectomy/chemo 2013 with mets to the lung s/p LLL wedge resection 2014.   Diabetes mellitus    x over 10 yrs   GERD (gastroesophageal reflux disease)    Gout    H/O hiatal hernia    History of colon cancer    History of shingles    Hyperlipidemia    Hypertension    EKG 10/12 EPIC, chest- 1 view  6/13 EPIC    Last 2D Echo on 05/02/2012 showed EF of greater than 55%   Microcytic anemia    Mild CAD    a. cath by Dr. Mona in 03/2011 - 20-30% ostial bifurcation stenosis of LAD, otherwise only mild luminal irregularities in LAD/RCA, LVEF 60%, no RWMA.   c. 06/18/16 LHC after abnormal nuc showed mild non obst CAD   Neuropathy    Obesity     Patient Active Problem List   Diagnosis Date Noted   PAD (peripheral artery disease) 11/15/2023   GERD (gastroesophageal reflux disease) 09/29/2023   Pain due to onychomycosis of toenails of both feet 04/28/2022   Chest pain 09/29/2020   Moderate risk chest pain 06/24/2016   Mixed hyperlipidemia 06/24/2016   Unstable angina (HCC) 06/19/2016   Mild CAD    Chest pain with moderate risk of acute  coronary syndrome 06/17/2015   Type 2 diabetes mellitus with obesity 06/17/2015   Obesity (BMI 30-39.9)    Colon carcinoma metastatic to 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   S/P right colectomy 01/07/2012   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   Gout    Essential hypertension    Dyslipidemia     Past Surgical History:  Procedure Laterality Date   ABDOMINAL HYSTERECTOMY     APPENDECTOMY     CARDIAC CATHETERIZATION  2001,2012   CARDIAC CATHETERIZATION N/A 06/18/2016   Procedure: Left Heart Cath and Coronary Angiography;  Surgeon: Lonni Hanson, MD;  Location: [facility] INVASIVE CV LAB;  Service: Cardiovascular;  Laterality: N/A;   CATARACT EXTRACTION W/PHACO Left 02/17/2015   Procedure: CATARACT EXTRACTION PHACO AND INTRAOCULAR LENS PLACEMENT (IOC);  Surgeon: Cherene Mania, MD;  Location: [facility];  Service: Ophthalmology;  Laterality: Left;  CDE:8.01    CATARACT EXTRACTION W/PHACO Right 08/03/2021   Procedure: CATARACT EXTRACTION PHACO AND INTRAOCULAR LENS PLACEMENT (IOC);  Surgeon: Harrie Agent, MD;  Location: [facility];  Service: Ophthalmology;  Laterality: Right;  CDE: 13.97   CHOLECYSTECTOMY     COLON SURGERY     PARTIAL COLECTOMY 30 YRS / AGAIN 2013   COLONOSCOPY  11/26/2011   Procedure: COLONOSCOPY;  Surgeon: Claudis RAYMOND Rivet, MD;  Location: [facility]  ENDO SU[facility];  Service: Endoscopy;  Laterality: N/A;  2:15   COLONOSCOPY N/A 12/08/2012   Procedure: COLONOSCOPY;  Surgeon: Claudis RAYMOND Rivet, MD;  Location: [facility];  Service: Endoscopy;  Laterality: N/A;  815-moved to 0950 Ann to notify pt   COLONOSCOPY N/A 03/25/2016   Procedure: COLONOSCOPY;  Surgeon: Claudis RAYMOND Rivet, MD;  Location: [facility];  Service: Endoscopy;  Laterality: N/A;  1200   COLONOSCOPY WITH PROPOFOL  N/A 07/29/2021   Procedure: COLONOSCOPY WITH PROPOFOL ;  Surgeon: Rivet Claudis RAYMOND, MD;  Location: [facility];  Service: Endoscopy;  Laterality: N/A;  11:10   ESOPHAGOGASTRODUODENOSCOPY  06/16/2011   Procedure: ESOPHAGOGASTRODUODENOSCOPY  (EGD);  Surgeon: Claudis RAYMOND Rivet, MD;  Location: [facility];  Service: Endoscopy;  Laterality: N/A;  2:15   POLYPECTOMY  07/29/2021   Procedure: POLYPECTOMY;  Surgeon: Rivet Claudis RAYMOND, MD;  Location: [facility];  Service: Endoscopy;;  transverse colon   PORT-A-CATH REMOVAL N/A 06/04/2014   Procedure: REMOVAL PORT-A-CATH;  Surgeon: Donnice KATHEE Lunger, MD;  Location: [facility];  Service: General;  Laterality: N/A;   PORTACATH PLACEMENT  02/22/2012   Procedure: INSERTION PORT-A-CATH;  Surgeon: Donnice KATHEE Lunger, MD;  Location: [facility];  Service: General;  Laterality: N/A;  left subclavian   VIDEO ASSISTED THORACOSCOPY (VATS)/WEDGE RESECTION Left 03/27/2013   Procedure: VIDEO ASSISTED THORACOSCOPY (VATS)/WEDGE RESECTION;  Surgeon: Dallas KATHEE Jude, MD;  Location: [facility] OR;  Service: Thoracic;  Laterality: Left;   VIDEO BRONCHOSCOPY N/A 03/27/2013   Procedure: VIDEO BRONCHOSCOPY;  Surgeon: Dallas KATHEE Jude, MD;  Location: [facility] OR;  Service: Thoracic;  Laterality: N/A;    OB History   No obstetric history on file.      Home Medications    Prior to Admission medications  Medication Sig Start Date End Date Taking? Authorizing Provider  albuterol  (VENTOLIN  HFA) 108 (90 Base) MCG/ACT inhaler Inhale 2 puffs into the lungs every 6 (six) hours as needed. 07/13/24  Yes Leath-Warren, Etta PARAS, NP  azelastine (ASTELIN) 0.1 % nasal spray Place 2 sprays into both nostrils 2 (two) times daily. Use in each nostril as directed 07/13/24  Yes Leath-Warren, Etta PARAS, NP  fluconazole  (DIFLUCAN ) 150 MG tablet Take 150 mg by mouth daily. 07/10/24  Yes [provider]  predniSONE (DELTASONE) 20 MG tablet Take 2 tablets (40 mg total) by mouth daily with breakfast for 5 days. 07/13/24 07/18/24 Yes Leath-Warren, Etta PARAS, NP  acetaminophen  (TYLENOL ) 500 MG tablet Take 1,000 mg by mouth every 6 (six) hours as needed for mild pain or moderate pain. For pain    [provider]  allopurinol  (ZYLOPRIM ) 100 MG  tablet Take 100 mg by mouth daily. 04/13/19   [provider]  Blood Glucose Monitoring Suppl (ONETOUCH VERIO REFLECT) w/Device KIT USE TO CHECK BLOOD GLUCOSE 3 TIMES DAILY BEFORE MEALS 08/13/21   [provider]  carvedilol  (COREG ) 3.125 MG tablet Take 1 tablet (3.125 mg total) by mouth 2 (two) times daily. 11/30/23   West, Katlyn D, NP  Cyanocobalamin (B-12 PO) Place 1,000 mcg under the tongue daily.    [provider]  diphenoxylate -atropine  (LOMOTIL ) 2.5-0.025 MG tablet Take 1 tablet by mouth 4 (four) times daily as needed for diarrhea or loose stools.    [provider]  esomeprazole  (NEXIUM ) 40 MG capsule Take 40 mg by mouth every evening. 07/16/11   Rehman, Claudis RAYMOND, MD  fexofenadine (ALLEGRA) 180 MG tablet Take 180 mg by mouth daily as needed for allergies.  [provider]  furosemide  (LASIX ) 20 MG tablet Take 20 mg by mouth daily. 11/07/23   [provider]  gabapentin  (NEURONTIN ) 100 MG capsule Take 100 mg by mouth 3 (three) times daily.    [provider]  isosorbide  mononitrate (IMDUR ) 60 MG 24 hr tablet Take 1 tablet (60 mg total) by mouth 2 (two) times daily. 11/30/23   West, Katlyn D, NP  linagliptin  (TRADJENTA ) 5 MG TABS tablet Take 5 mg by mouth daily after breakfast.    [provider]  meclizine  (ANTIVERT ) 25 MG tablet Take 25 mg by mouth 2 (two) times daily. 07/30/20   [provider]  MITIGARE  0.6 MG CAPS Take 1 capsule by mouth daily. 04/13/23   [provider]  Multiple Vitamins-Minerals (WOMENS MULTIVITAMIN PO) Take 1 tablet by mouth daily.    [provider]  nitroGLYCERIN  (NITROSTAT ) 0.4 MG SL tablet Place 1 tablet (0.4 mg total) under the tongue every 5 (five) minutes as needed for chest pain. 11/30/23 03/26/24  West, Katlyn D, NP  potassium chloride  SA (KLOR-CON  M20) 20 MEQ tablet Take 1 tablet (20 mEq total) by mouth daily. 11/22/16   Debby Olam POUR, NP  ramipril  (ALTACE ) 5 MG capsule  Take 5 mg by mouth every evening.    [provider]  rosuvastatin  (CRESTOR ) 20 MG tablet Take 1 tablet (20 mg total) by mouth daily. 11/30/23   West, Katlyn D, NP  traZODone (DESYREL) 50 MG tablet Take 50 mg by mouth at bedtime. 09/05/23   [provider]    Family History Family History  Problem Relation Age of Onset   Cancer Maternal Aunt        throat,     Social History Social History[1]   Allergies   Aspirin    Review of Systems Review of Systems Per HPI  Physical Exam Triage Vital Signs ED Triage Vitals  Encounter Vitals Group     BP 07/13/24 1536 130/81     Girls Systolic BP Percentile --      Girls Diastolic BP Percentile --      Boys Systolic BP Percentile --      Boys Diastolic BP Percentile --      Pulse Rate 07/13/24 1536 75     Resp 07/13/24 1536 20     Temp 07/13/24 1536 97.6 F (36.4 C)     Temp Source 07/13/24 1536 Oral     SpO2 07/13/24 1536 94 %     Weight --      Height --      Head Circumference --      Peak Flow --      Pain Score 07/13/24 1533 7     Pain Loc --      Pain Education --      Exclude from Growth Chart --    No data found.  Updated Vital Signs BP 130/81 (BP Location: Right Arm)   Pulse 75   Temp 97.6 F (36.4 C) (Oral)   Resp 20   SpO2 94%   Visual Acuity Right Eye Distance:   Left Eye Distance:   Bilateral Distance:    Right Eye Near:   Left Eye Near:    Bilateral Near:     Physical Exam Vitals and nursing note reviewed.  Constitutional:      General: She is not in acute distress.    Appearance: Normal appearance.  HENT:     Head: Normocephalic.     Right Ear: Tympanic  membrane, ear canal and external ear normal.     Left Ear: Tympanic membrane, ear canal and external ear normal.     Nose: Nose normal.     Mouth/Throat:     Mouth: Mucous membranes are moist.  Eyes:     Extraocular Movements: Extraocular movements intact.     Conjunctiva/sclera: Conjunctivae normal.     Pupils: Pupils  are equal, round, and reactive to light.  Cardiovascular:     Rate and Rhythm: Normal rate and regular rhythm.     Pulses: Normal pulses.     Heart sounds: Normal heart sounds.  Pulmonary:     Effort: Pulmonary effort is normal. No respiratory distress.     Breath sounds: Normal breath sounds. No stridor. No wheezing, rhonchi or rales.     Comments: Loose sounding cough noticed during the assessment Abdominal:     General: Bowel sounds are normal.     Palpations: Abdomen is soft.     Tenderness: There is no abdominal tenderness.  Musculoskeletal:     Cervical back: Normal range of motion.  Skin:    General: Skin is warm and dry.  Neurological:     General: No focal deficit present.     Mental Status: She is alert and oriented to person, place, and time.  Psychiatric:        Mood and Affect: Mood normal.        Behavior: Behavior normal.      UC Treatments / Results  Labs (all labs ordered are listed, but only abnormal results are displayed) Labs Reviewed  POC SOFIA SARS ANTIGEN FIA  POCT INFLUENZA A/B    EKG   Radiology No results found.  Procedures Procedures (including critical care time)  Medications Ordered in UC Medications - No data to display  Initial Impression / Assessment and Plan / UC Course  I have reviewed the triage vital signs and the nursing notes.  Pertinent labs & imaging results that were available during my care of the patient were reviewed by me and considered in my medical decision making (see chart for details).  On exam, lung sounds are clear throughout, room air sats are at 94%.  There is no obvious wheezing, rales, or rhonchi noted to indicate need for chest x-ray.  COVID test and influenza test were negative.  Symptoms are consistent with a viral URI with cough.  Patient with loose sounding cough noticed during the assessment, she also endorses intermittent wheezing.  Symptomatic treatment provided with prednisone 20 mg and an albuterol   inhaler.  Azelastine nasal spray provided for nasal congestion and bilateral ear fullness.  Supportive care recommendations were provided and discussed with the patient to include fluids, rest, over-the-counter Tylenol , use of humidifier during sleep.  Discussed indications with patient and family regarding follow-up.  Patient and family were in agreement with this plan of care and verbalized understanding.  All questions were answered.  Patient stable for discharge.   Final Clinical Impressions(s) / UC Diagnoses   Final diagnoses:  Viral URI with cough  Wheezing     Discharge Instructions      The COVID test and influenza test were negative. Take medication as prescribed. You may take over-the-counter Delsym or Robitussin as needed for cough. Continue over-the-counter Tylenol  for pain, fever, or general discomfort. Recommend use of normal saline nasal spray throughout the day for nasal congestion or runny nose. For your cough, recommend use of humidifier in your bedroom at nighttime during sleep and sleeping  elevated on pillows while symptoms persist. Symptoms should improve over the next 5 to 7 days.  If symptoms fail to improve, or begin to worsen, you may follow-up in this clinic or with your primary care physician for further evaluation. Follow-up as needed.     ED Prescriptions     Medication Sig Dispense Auth. Provider   albuterol  (VENTOLIN  HFA) 108 (90 Base) MCG/ACT inhaler Inhale 2 puffs into the lungs every 6 (six) hours as needed. 8 g Leath-Warren, Etta PARAS, NP   predniSONE (DELTASONE) 20 MG tablet Take 2 tablets (40 mg total) by mouth daily with breakfast for 5 days. 10 tablet Leath-Warren, Etta PARAS, NP   azelastine (ASTELIN) 0.1 % nasal spray Place 2 sprays into both nostrils 2 (two) times daily. Use in each nostril as directed 30 mL Leath-Warren, Etta PARAS, NP      PDMP not reviewed this encounter.     [1]  Social History Tobacco Use   Smoking status:  Never   Smokeless tobacco: Never  Vaping Use   Vaping status: Never Used  Substance Use Topics   Alcohol  use: No   Drug use: No     Gilmer Etta PARAS, NP 07/13/24 1622  "

## 2024-07-13 NOTE — ED Triage Notes (Signed)
 Pt reports cough and congestion, headache, body aches, has been treating sx's with tylenol .

## 2024-07-13 NOTE — Discharge Instructions (Addendum)
 The COVID test and influenza test were negative. Take medication as prescribed. You may take over-the-counter Delsym or Robitussin as needed for cough. Continue over-the-counter Tylenol  for pain, fever, or general discomfort. Recommend use of normal saline nasal spray throughout the day for nasal congestion or runny nose. For your cough, recommend use of humidifier in your bedroom at nighttime during sleep and sleeping elevated on pillows while symptoms persist. Symptoms should improve over the next 5 to 7 days.  If symptoms fail to improve, or begin to worsen, you may follow-up in this clinic or with your primary care physician for further evaluation. Follow-up as needed.

## 2024-07-17 ENCOUNTER — Ambulatory Visit: Admitting: Podiatry

## 2024-07-17 DIAGNOSIS — M79674 Pain in right toe(s): Secondary | ICD-10-CM | POA: Diagnosis not present

## 2024-07-17 DIAGNOSIS — B351 Tinea unguium: Secondary | ICD-10-CM | POA: Diagnosis not present

## 2024-07-17 DIAGNOSIS — M79675 Pain in left toe(s): Secondary | ICD-10-CM

## 2024-07-17 NOTE — Progress Notes (Signed)
"  °  Subjective:  Patient ID: Peggy French, female    DOB: October 21, 1940,  MRN: [id number]  Chief Complaint  Patient presents with   [facility]    Diabetic foot nail trim     84 y.o. female presents with the above complaint. History confirmed with patient.  Her diabetes is well-controlled although she notes recent hyperglycemia has had some respiratory illnesses and has been on oral steroids.  Nails are thickened elongated and causing discomfort.  Previous debridement was helpful.  Achilles tendon pain is doing better after therapy    Objective:  Physical Exam: warm, good capillary refill, no trophic changes or ulcerative lesions, normal DP and PT pulses, and abnormal sensory exam.  Left Foot: dystrophic yellowed discolored nail plates with subungual debris Right Foot: dystrophic yellowed discolored nail plates with subungual debris.     Assessment:   1. Pain due to onychomycosis of toenails of both feet        Plan:  Patient was evaluated and treated and all questions answered.    Discussed the etiology and treatment options for the condition in detail with the patient.  Prior debridement has been helpful in reducing pain and improving function. Recommended debridement of the nails today. Sharp and mechanical debridement performed of all painful and mycotic nails today. Nails debrided in length and thickness using a nail nipper to level of comfort. Discussed treatment options including appropriate shoe gear. Follow up as needed for painful nails.   Achilles is doing much better   Return in about 3 months (around 10/15/2024) for at risk diabetic foot care.   "

## 2024-07-18 ENCOUNTER — Ambulatory Visit (HOSPITAL_COMMUNITY): Attending: Podiatry

## 2024-07-20 ENCOUNTER — Ambulatory Visit (HOSPITAL_COMMUNITY)

## 2024-07-24 ENCOUNTER — Ambulatory Visit (HOSPITAL_COMMUNITY): Admitting: Physical Therapy

## 2024-07-26 ENCOUNTER — Ambulatory Visit (HOSPITAL_COMMUNITY): Attending: Podiatry

## 2024-07-31 ENCOUNTER — Ambulatory Visit (HOSPITAL_COMMUNITY): Attending: Podiatry

## 2024-08-02 ENCOUNTER — Telehealth: Payer: Self-pay

## 2024-08-02 NOTE — Telephone Encounter (Signed)
 Left the patient a detailed voicemail regarding her appointment on Monday, 08/06/2024, being rescheduled to Wednesday 08/08/2024 starting at 2:30 pm, due to the inclement weather forecast for this weekend.

## 2024-08-03 ENCOUNTER — Encounter: Payer: Self-pay | Admitting: *Deleted

## 2024-08-03 ENCOUNTER — Telehealth: Payer: Self-pay | Admitting: Oncology

## 2024-08-03 NOTE — Progress Notes (Signed)
 Per NP: Rescheduled appointment of 1/28 will need to be moved out for several weeks. MD needs to be in office for her appointment and will not be here at that time. Scheduler notified.

## 2024-08-03 NOTE — Telephone Encounter (Signed)
 Rescheduled appointments per patients request via incoming call. Talked with the patient and she is aware of the changes made to her upcoming appointments.

## 2024-08-06 ENCOUNTER — Inpatient Hospital Stay

## 2024-08-06 ENCOUNTER — Inpatient Hospital Stay: Admitting: Oncology

## 2024-08-08 ENCOUNTER — Inpatient Hospital Stay: Admitting: Oncology

## 2024-08-08 ENCOUNTER — Inpatient Hospital Stay

## 2024-08-16 ENCOUNTER — Telehealth: Payer: Self-pay | Admitting: Nurse Practitioner

## 2024-08-16 NOTE — Telephone Encounter (Signed)
 PT called to confirm appts; day and time confirmed.

## 2024-08-20 ENCOUNTER — Inpatient Hospital Stay: Admitting: Nurse Practitioner

## 2024-08-20 ENCOUNTER — Inpatient Hospital Stay

## 2024-10-16 ENCOUNTER — Ambulatory Visit: Admitting: Podiatry
